# Patient Record
Sex: Female | Born: 1935 | Race: Black or African American | Hispanic: No | Smoking: Former smoker
Health system: Southern US, Community
[De-identification: ages and names within clinical notes are randomized; demographics above are authoritative.]

## PROBLEM LIST (undated history)

## (undated) DIAGNOSIS — Z87440 Personal history of urinary (tract) infections: Secondary | ICD-10-CM

## (undated) DIAGNOSIS — T7840XA Allergy, unspecified, initial encounter: Secondary | ICD-10-CM

## (undated) DIAGNOSIS — F329 Major depressive disorder, single episode, unspecified: Secondary | ICD-10-CM

## (undated) DIAGNOSIS — K219 Gastro-esophageal reflux disease without esophagitis: Secondary | ICD-10-CM

## (undated) DIAGNOSIS — I509 Heart failure, unspecified: Secondary | ICD-10-CM

## (undated) DIAGNOSIS — E7800 Pure hypercholesterolemia, unspecified: Secondary | ICD-10-CM

## (undated) DIAGNOSIS — I10 Essential (primary) hypertension: Secondary | ICD-10-CM

## (undated) DIAGNOSIS — F32A Depression, unspecified: Secondary | ICD-10-CM

## (undated) DIAGNOSIS — I639 Cerebral infarction, unspecified: Secondary | ICD-10-CM

## (undated) DIAGNOSIS — Z87442 Personal history of urinary calculi: Secondary | ICD-10-CM

## (undated) DIAGNOSIS — D649 Anemia, unspecified: Secondary | ICD-10-CM

## (undated) DIAGNOSIS — I499 Cardiac arrhythmia, unspecified: Secondary | ICD-10-CM

## (undated) DIAGNOSIS — Z9289 Personal history of other medical treatment: Secondary | ICD-10-CM

## (undated) HISTORY — DX: Anemia, unspecified: D64.9

## (undated) HISTORY — DX: Allergy, unspecified, initial encounter: T78.40XA

## (undated) HISTORY — DX: Cardiac arrhythmia, unspecified: I49.9

## (undated) HISTORY — DX: Personal history of other medical treatment: Z92.89

## (undated) HISTORY — DX: Personal history of urinary calculi: Z87.442

## (undated) HISTORY — DX: Essential (primary) hypertension: I10

## (undated) HISTORY — DX: Pure hypercholesterolemia, unspecified: E78.00

## (undated) HISTORY — DX: Gastro-esophageal reflux disease without esophagitis: K21.9

## (undated) HISTORY — DX: Cerebral infarction, unspecified: I63.9

## (undated) HISTORY — DX: Heart failure, unspecified: I50.9

## (undated) HISTORY — DX: Personal history of urinary (tract) infections: Z87.440

---

## 1982-05-03 HISTORY — PX: CHOLECYSTECTOMY: SHX55

## 2004-03-26 ENCOUNTER — Other Ambulatory Visit: Payer: Self-pay

## 2004-03-26 ENCOUNTER — Emergency Department: Payer: Self-pay | Admitting: Emergency Medicine

## 2004-03-30 ENCOUNTER — Other Ambulatory Visit: Payer: Self-pay

## 2004-03-30 ENCOUNTER — Emergency Department: Payer: Self-pay | Admitting: Emergency Medicine

## 2004-04-04 ENCOUNTER — Emergency Department: Payer: Self-pay | Admitting: Emergency Medicine

## 2004-05-06 ENCOUNTER — Ambulatory Visit: Payer: Self-pay | Admitting: Internal Medicine

## 2004-07-04 ENCOUNTER — Emergency Department: Payer: Self-pay | Admitting: General Practice

## 2004-07-20 ENCOUNTER — Ambulatory Visit: Payer: Self-pay | Admitting: Internal Medicine

## 2004-08-03 ENCOUNTER — Ambulatory Visit: Payer: Self-pay | Admitting: Internal Medicine

## 2004-10-02 ENCOUNTER — Ambulatory Visit: Payer: Self-pay | Admitting: Internal Medicine

## 2005-05-14 ENCOUNTER — Ambulatory Visit: Payer: Self-pay | Admitting: Gastroenterology

## 2005-07-22 ENCOUNTER — Other Ambulatory Visit: Payer: Self-pay

## 2005-07-22 ENCOUNTER — Emergency Department: Payer: Self-pay | Admitting: Internal Medicine

## 2005-07-29 ENCOUNTER — Ambulatory Visit: Payer: Self-pay | Admitting: Internal Medicine

## 2005-08-05 ENCOUNTER — Ambulatory Visit: Payer: Self-pay | Admitting: Internal Medicine

## 2005-10-17 ENCOUNTER — Emergency Department: Payer: Self-pay | Admitting: Emergency Medicine

## 2005-10-17 ENCOUNTER — Other Ambulatory Visit: Payer: Self-pay

## 2006-08-02 ENCOUNTER — Ambulatory Visit: Payer: Self-pay | Admitting: Internal Medicine

## 2006-10-21 ENCOUNTER — Ambulatory Visit: Payer: Self-pay | Admitting: Internal Medicine

## 2007-04-03 ENCOUNTER — Emergency Department: Payer: Self-pay | Admitting: Emergency Medicine

## 2007-04-03 ENCOUNTER — Other Ambulatory Visit: Payer: Self-pay

## 2007-11-01 ENCOUNTER — Ambulatory Visit: Payer: Self-pay | Admitting: Internal Medicine

## 2009-08-13 ENCOUNTER — Emergency Department: Payer: Self-pay | Admitting: Emergency Medicine

## 2011-06-14 LAB — BASIC METABOLIC PANEL
Creatinine: 0.8 mg/dL (ref 0.5–1.1)
Glucose: 95 mg/dL

## 2011-06-14 LAB — LIPID PANEL: Cholesterol: 192 mg/dL (ref 0–200)

## 2011-09-20 ENCOUNTER — Observation Stay: Payer: Self-pay | Admitting: Internal Medicine

## 2011-09-20 LAB — COMPREHENSIVE METABOLIC PANEL
Albumin: 3.7 g/dL (ref 3.4–5.0)
Anion Gap: 7 (ref 7–16)
Calcium, Total: 9.2 mg/dL (ref 8.5–10.1)
Chloride: 105 mmol/L (ref 98–107)
Co2: 30 mmol/L (ref 21–32)
Creatinine: 0.82 mg/dL (ref 0.60–1.30)
EGFR (African American): >60
EGFR (Non-African Amer.): >60
Glucose: 114 mg/dL — ABNORMAL HIGH (ref 65–99)
Osmolality: 285 (ref 275–301)
SGOT(AST): 20 U/L (ref 15–37)
Sodium: 142 mmol/L (ref 136–145)
Total Protein: 7.8 g/dL (ref 6.4–8.2)

## 2011-09-20 LAB — CBC
HGB: 13.8 g/dL (ref 12.0–16.0)
MCH: 28.1 pg (ref 26.0–34.0)
MCHC: 32.7 g/dL (ref 32.0–36.0)
Platelet: 356 10*3/uL (ref 150–440)
RDW: 14.1 % (ref 11.5–14.5)

## 2011-09-20 LAB — CK TOTAL AND CKMB (NOT AT ARMC): CK, Total: 94 U/L (ref 21–215)

## 2011-09-20 LAB — TROPONIN I: Troponin-I: 0.06 ng/mL — ABNORMAL HIGH

## 2011-09-21 LAB — BASIC METABOLIC PANEL WITH GFR
Anion Gap: 7
BUN: 11 mg/dL
Calcium, Total: 8.8 mg/dL
Chloride: 106 mmol/L
Co2: 29 mmol/L
Creatinine: 0.76 mg/dL
EGFR (African American): >60
EGFR (Non-African Amer.): >60
Glucose: 81 mg/dL
Osmolality: 282
Potassium: 3.9 mmol/L
Sodium: 142 mmol/L

## 2011-09-21 LAB — URINALYSIS, COMPLETE
Bacteria: NONE SEEN
Blood: NEGATIVE
Glucose,UR: NEGATIVE mg/dL (ref 0–75)
Nitrite: NEGATIVE
Protein: NEGATIVE
RBC,UR: <1 /HPF (ref 0–5)
Specific Gravity: 1.005 (ref 1.003–1.030)
Squamous Epithelial: 1
WBC UR: 7 /HPF (ref 0–5)

## 2011-09-21 LAB — LIPID PANEL
Cholesterol: 155 mg/dL (ref 0–200)
HDL Cholesterol: 46 mg/dL (ref 40–60)
Ldl Cholesterol, Calc: 85 mg/dL (ref 0–100)
Triglycerides: 119 mg/dL (ref 0–200)
VLDL Cholesterol, Calc: 24 mg/dL (ref 5–40)

## 2011-09-21 LAB — TROPONIN I: Troponin-I: 0.07 ng/mL — ABNORMAL HIGH

## 2012-03-22 ENCOUNTER — Encounter: Payer: Self-pay | Admitting: Internal Medicine

## 2012-03-22 ENCOUNTER — Ambulatory Visit: Payer: Self-pay | Admitting: Internal Medicine

## 2012-03-24 ENCOUNTER — Telehealth: Payer: Self-pay | Admitting: Internal Medicine

## 2012-03-24 NOTE — Telephone Encounter (Signed)
Amlodipine besylate 10mg  tab Take one tablet by mouth every day [employer]

## 2012-03-29 ENCOUNTER — Other Ambulatory Visit: Payer: Self-pay | Admitting: *Deleted

## 2012-03-29 MED ORDER — AMLODIPINE BESYLATE 10 MG PO TABS: 10.0000 mg | ORAL_TABLET | Freq: Every day | ORAL | Status: DC

## 2012-03-29 NOTE — Telephone Encounter (Signed)
Refilled script for NORVASC 10 mg

## 2012-04-13 ENCOUNTER — Telehealth: Payer: Self-pay | Admitting: Internal Medicine

## 2012-04-13 NOTE — Telephone Encounter (Signed)
She still has not gotten confirmation about medicare. However, she has spoken to someone there. I asked her to call back tomorrow and speak to someone up front about her insurance.

## 2012-04-13 NOTE — Telephone Encounter (Signed)
Pt is needing a call back. She would not go into detail she wanted to speak with a nurse.

## 2012-04-13 NOTE — Telephone Encounter (Signed)
Please call pt and find out what she needs

## 2012-07-11 ENCOUNTER — Telehealth: Payer: Self-pay | Admitting: Internal Medicine

## 2012-07-11 NOTE — Telephone Encounter (Signed)
Previous pt of Dr. Bary Leriche.  Not scheduled until 10/12/12 as new pt here; pt was a no show  03/2012.Marland Kitchen  Pt states she needs her meds and Dr. Nicki Reaper has been her only doctor.  Pt asking for help to get refills until she can be seen.   Please advise.

## 2012-07-12 ENCOUNTER — Other Ambulatory Visit: Payer: Self-pay | Admitting: *Deleted

## 2012-07-12 MED ORDER — AMLODIPINE BESYLATE 10 MG PO TABS: 10.0000 mg | ORAL_TABLET | Freq: Every day | ORAL | Status: DC

## 2012-07-12 MED ORDER — OMEPRAZOLE 20 MG PO CPDR: 20.0000 mg | DELAYED_RELEASE_CAPSULE | Freq: Every day | ORAL | Status: DC

## 2012-07-12 NOTE — Telephone Encounter (Signed)
Patient needing her blood pressure medication and acid reflux medication. Patient has been out of her medication for a couple of days

## 2012-07-12 NOTE — Telephone Encounter (Signed)
Sent in to pharmacy. Per Dr Nicki Reaper

## 2012-07-13 NOTE — Telephone Encounter (Signed)
Meds have already been filled

## 2012-10-10 ENCOUNTER — Telehealth: Payer: Self-pay | Admitting: Internal Medicine

## 2012-10-10 ENCOUNTER — Other Ambulatory Visit: Payer: Self-pay | Admitting: *Deleted

## 2012-10-10 MED ORDER — ESCITALOPRAM OXALATE 10 MG PO TABS: 5.0000 mg | ORAL_TABLET | Freq: Every day | ORAL | Status: DC

## 2012-10-10 MED ORDER — AMLODIPINE BESYLATE 10 MG PO TABS: 10.0000 mg | ORAL_TABLET | Freq: Every day | ORAL | Status: DC

## 2012-10-10 NOTE — Telephone Encounter (Signed)
Refilled for 30 days-pt aware that we will send in additional refills after her visit on Thursday

## 2012-10-10 NOTE — Telephone Encounter (Addendum)
escitalopram (LEXAPRO) 10 MG tablet #90  amLODipine (NORVASC) 10 MG tablet #90

## 2012-10-12 ENCOUNTER — Ambulatory Visit (INDEPENDENT_AMBULATORY_CARE_PROVIDER_SITE_OTHER): Payer: Medicare Other | Admitting: Internal Medicine

## 2012-10-12 ENCOUNTER — Encounter: Payer: Self-pay | Admitting: Internal Medicine

## 2012-10-12 VITALS — BP 120/70 | HR 87 | Temp 98.3°F | Ht 63.5 in | Wt 148.5 lb

## 2012-10-12 DIAGNOSIS — F419 Anxiety disorder, unspecified: Secondary | ICD-10-CM

## 2012-10-12 DIAGNOSIS — F411 Generalized anxiety disorder: Secondary | ICD-10-CM

## 2012-10-12 DIAGNOSIS — I10 Essential (primary) hypertension: Secondary | ICD-10-CM

## 2012-10-12 DIAGNOSIS — E7800 Pure hypercholesterolemia, unspecified: Secondary | ICD-10-CM

## 2012-10-12 DIAGNOSIS — I639 Cerebral infarction, unspecified: Secondary | ICD-10-CM

## 2012-10-12 DIAGNOSIS — R5381 Other malaise: Secondary | ICD-10-CM

## 2012-10-12 DIAGNOSIS — R5383 Other fatigue: Secondary | ICD-10-CM

## 2012-10-12 DIAGNOSIS — I6350 Cerebral infarction due to unspecified occlusion or stenosis of unspecified cerebral artery: Secondary | ICD-10-CM

## 2012-10-12 DIAGNOSIS — E119 Type 2 diabetes mellitus without complications: Secondary | ICD-10-CM

## 2012-10-12 DIAGNOSIS — R634 Abnormal weight loss: Secondary | ICD-10-CM

## 2012-10-18 ENCOUNTER — Other Ambulatory Visit (INDEPENDENT_AMBULATORY_CARE_PROVIDER_SITE_OTHER): Payer: Medicare Other

## 2012-10-18 DIAGNOSIS — R5381 Other malaise: Secondary | ICD-10-CM

## 2012-10-18 DIAGNOSIS — E7800 Pure hypercholesterolemia, unspecified: Secondary | ICD-10-CM

## 2012-10-18 DIAGNOSIS — R5383 Other fatigue: Secondary | ICD-10-CM

## 2012-10-18 DIAGNOSIS — E119 Type 2 diabetes mellitus without complications: Secondary | ICD-10-CM

## 2012-10-18 LAB — CBC WITH DIFFERENTIAL/PLATELET
Basophils Relative: 0.5 % (ref 0.0–3.0)
Eosinophils Absolute: 0.3 10*3/uL (ref 0.0–0.7)
Eosinophils Relative: 5.7 % — ABNORMAL HIGH (ref 0.0–5.0)
Lymphocytes Relative: 34 % (ref 12.0–46.0)
Monocytes Relative: 8.8 % (ref 3.0–12.0)
Neutrophils Relative %: 51 % (ref 43.0–77.0)
RBC: 5.07 Mil/uL (ref 3.87–5.11)
WBC: 5.9 10*3/uL (ref 4.5–10.5)

## 2012-10-18 LAB — BASIC METABOLIC PANEL
Calcium: 9.3 mg/dL (ref 8.4–10.5)
Creatinine, Ser: 0.8 mg/dL (ref 0.4–1.2)
GFR: 73.45 mL/min (ref >60.00)
Glucose, Bld: 107 mg/dL — ABNORMAL HIGH (ref 70–99)
Sodium: 139 mEq/L (ref 135–145)

## 2012-10-18 LAB — LIPID PANEL
HDL: 56.4 mg/dL (ref >39.00)
Triglycerides: 127 mg/dL (ref 0.0–149.0)

## 2012-10-18 LAB — HEPATIC FUNCTION PANEL
ALT: 9 U/L (ref 0–35)
Albumin: 4 g/dL (ref 3.5–5.2)
Total Protein: 7.4 g/dL (ref 6.0–8.3)

## 2012-10-18 LAB — LDL CHOLESTEROL, DIRECT: Direct LDL: 126.7 mg/dL

## 2012-10-19 ENCOUNTER — Other Ambulatory Visit: Payer: Self-pay | Admitting: *Deleted

## 2012-10-19 MED ORDER — OMEPRAZOLE 20 MG PO CPDR: 20.0000 mg | DELAYED_RELEASE_CAPSULE | Freq: Every day | ORAL | Status: DC

## 2012-10-22 ENCOUNTER — Encounter: Payer: Self-pay | Admitting: Internal Medicine

## 2012-10-22 DIAGNOSIS — Z8673 Personal history of transient ischemic attack (TIA), and cerebral infarction without residual deficits: Secondary | ICD-10-CM | POA: Insufficient documentation

## 2012-10-22 DIAGNOSIS — E7800 Pure hypercholesterolemia, unspecified: Secondary | ICD-10-CM | POA: Insufficient documentation

## 2012-10-22 DIAGNOSIS — F419 Anxiety disorder, unspecified: Secondary | ICD-10-CM | POA: Insufficient documentation

## 2012-10-22 DIAGNOSIS — R634 Abnormal weight loss: Secondary | ICD-10-CM | POA: Insufficient documentation

## 2012-10-22 DIAGNOSIS — I10 Essential (primary) hypertension: Secondary | ICD-10-CM | POA: Insufficient documentation

## 2012-10-22 MED ORDER — ESCITALOPRAM OXALATE 10 MG PO TABS: 5.0000 mg | ORAL_TABLET | Freq: Every day | ORAL | Status: DC

## 2012-10-22 NOTE — Assessment & Plan Note (Signed)
Low cholesterol diet and exercise.  Continue lipitor.  Check lipid panel and liver function.

## 2012-10-22 NOTE — Assessment & Plan Note (Signed)
Blood pressure doing well.  Follow.  Follow metabolic panel.

## 2012-10-22 NOTE — Assessment & Plan Note (Signed)
Declines further w/up.  Will check labs.  Is agreeable to have labs drawn.  Follow.  Encourage increased po intake.

## 2012-10-22 NOTE — Progress Notes (Signed)
Subjective:    Patient ID: Taylor Watson, female    DOB: [birth year], 79 y.o.   MRN: [id number]  HPI 79 year old female with past history of hypertension, hypercholesterolemia and previous CVA who comes in today for a scheduled follow up.  She states she is doing relatively well.  She has had some issues with her left eye running.  Is due to see opthalmology next month.  No pain in the eye.  No vision change.  Breathing stable.  No chest pain or tightness.  Due to see Dr Ubaldo Glassing next month.  No acid reflux.  Bowels stable.  Weight is down some.     Past Medical History  Diagnosis Date  . Hypertension   . CVA (cerebral vascular accident)   . Anemia   . Hypercholesterolemia   . GERD (gastroesophageal reflux disease)   . Allergy   . Hx: UTI (urinary tract infection)   . History of blood transfusion   . History of kidney stones     Outpatient Encounter Prescriptions as of 10/12/2012  Medication Sig Dispense Refill  . amLODipine (NORVASC) 10 MG tablet Take 1 tablet (10 mg total) by mouth daily.  30 tablet  0  . aspirin 81 MG tablet Take 81 mg by mouth daily.      Marland Kitchen atorvastatin (LIPITOR) 40 MG tablet Take 40 mg by mouth daily.      Marland Kitchen escitalopram (LEXAPRO) 10 MG tablet Take 0.5 tablets (5 mg total) by mouth daily. 1/2 tablet q day  30 tablet  0  . latanoprost (XALATAN) 0.005 % ophthalmic solution Place 1 drop into the left eye at bedtime.      . [DISCONTINUED] omeprazole (PRILOSEC) 20 MG capsule Take 1 capsule (20 mg total) by mouth daily.  30 capsule  2  . [DISCONTINUED] Calcium Carb-Cholecalciferol (CALCIUM 500 +D) 500-400 MG-UNIT TABS Take 1 tablet by mouth daily.       No facility-administered encounter medications on file as of 10/12/2012.    Review of Systems Patient denies any headache, lightheadedness or dizziness.  No significant sinus or allergy symptoms.  No chest pain, tightness or palpitations.  No increased shortness of breath, cough or congestion.  No nausea or vomiting.  No  acid reflux.  No abdominal pain or cramping.  No bowel change, such as diarrhea, constipation, BRBPR or melana.  No urine change.  Due to see Dr Ubaldo Glassing next month.  Some weight loss.  Handling stress well.       Objective:   Physical Exam Filed Vitals:   10/12/12 1529  BP: 120/70  Pulse: 87  Temp: 98.3 F (8.56 C)   79 year old female in no acute distress.   HEENT:  Nares- clear.  Oropharynx - without lesions. NECK:  Supple.  Nontender.  No audible bruit.  HEART:  Appears to be regular. LUNGS:  No crackles or wheezing audible.  Respirations even and unlabored.  RADIAL PULSE:  Equal bilaterally.  ABDOMEN:  Soft, nontender.  Bowel sounds present and normal.  No audible abdominal bruit.   EXTREMITIES:  No increased edema present.  DP pulses palpable and equal bilaterally.           Assessment & Plan:  GI.  She declines colonoscopy or further GI w/up.  Follow.    PULMONARY.  No cough or congestion.  Feels breathing stable.  Follow.   CARDIOVASCULAR.  Feels stable.  Due to see Dr Ubaldo Glassing next month.  Follow.  FATIGUE AND WEIGHT LOSS.  Check cbc, met c and tsh.     HEALTH MAINTENANCE.  Declines physical.  Declines GU, breast and rectal exams.  Declines further GI evaluation, mammogram and scanning.

## 2012-10-22 NOTE — Assessment & Plan Note (Signed)
On aspirin.  No reoccurring symptoms.  Follow.   

## 2012-10-22 NOTE — Assessment & Plan Note (Signed)
Does well on Lexapro qod.  Follow.

## 2012-11-01 ENCOUNTER — Other Ambulatory Visit: Payer: Self-pay | Admitting: Internal Medicine

## 2012-11-01 NOTE — Progress Notes (Signed)
Order placed for f/u liver panel.  

## 2012-11-07 ENCOUNTER — Other Ambulatory Visit: Payer: Self-pay | Admitting: *Deleted

## 2012-11-07 MED ORDER — AMLODIPINE BESYLATE 10 MG PO TABS: 10.0000 mg | ORAL_TABLET | Freq: Every day | ORAL | Status: DC

## 2012-11-09 ENCOUNTER — Other Ambulatory Visit: Payer: Self-pay

## 2012-11-15 ENCOUNTER — Other Ambulatory Visit (INDEPENDENT_AMBULATORY_CARE_PROVIDER_SITE_OTHER): Payer: Medicare Other

## 2012-11-15 DIAGNOSIS — R17 Unspecified jaundice: Secondary | ICD-10-CM

## 2012-11-15 LAB — HEPATIC FUNCTION PANEL
AST: 10 U/L (ref 0–37)
Albumin: 3.8 g/dL (ref 3.5–5.2)

## 2012-11-16 ENCOUNTER — Other Ambulatory Visit: Payer: Self-pay | Admitting: Internal Medicine

## 2012-11-16 DIAGNOSIS — I10 Essential (primary) hypertension: Secondary | ICD-10-CM

## 2012-11-16 DIAGNOSIS — E7800 Pure hypercholesterolemia, unspecified: Secondary | ICD-10-CM

## 2012-11-16 NOTE — Progress Notes (Signed)
Order placed for f/u labs.  

## 2013-01-12 ENCOUNTER — Other Ambulatory Visit (INDEPENDENT_AMBULATORY_CARE_PROVIDER_SITE_OTHER): Payer: Medicare Other

## 2013-01-12 DIAGNOSIS — I10 Essential (primary) hypertension: Secondary | ICD-10-CM

## 2013-01-12 DIAGNOSIS — E7800 Pure hypercholesterolemia, unspecified: Secondary | ICD-10-CM

## 2013-01-12 LAB — HEPATIC FUNCTION PANEL
ALT: 7 U/L (ref 0–35)
AST: 14 U/L (ref 0–37)
Albumin: 3.7 g/dL (ref 3.5–5.2)
Alkaline Phosphatase: 120 U/L — ABNORMAL HIGH (ref 39–117)
Bilirubin, Direct: 0 mg/dL (ref 0.0–0.3)
Total Bilirubin: 0.9 mg/dL (ref 0.3–1.2)
Total Protein: 7.4 g/dL (ref 6.0–8.3)

## 2013-01-12 LAB — BASIC METABOLIC PANEL
BUN: 13 mg/dL (ref 6–23)
CO2: 31 mEq/L (ref 19–32)
Calcium: 9.4 mg/dL (ref 8.4–10.5)
Chloride: 104 mEq/L (ref 96–112)
Creatinine, Ser: 0.9 mg/dL (ref 0.4–1.2)
GFR: 66.48 mL/min (ref >60.00)
Glucose, Bld: 91 mg/dL (ref 70–99)
Potassium: 3.9 mEq/L (ref 3.5–5.1)
Sodium: 140 mEq/L (ref 135–145)

## 2013-01-12 LAB — LIPID PANEL
Cholesterol: 234 mg/dL — ABNORMAL HIGH (ref 0–200)
VLDL: 27.6 mg/dL (ref 0.0–40.0)

## 2013-01-12 LAB — LDL CHOLESTEROL, DIRECT: Direct LDL: 163.5 mg/dL

## 2013-01-16 ENCOUNTER — Encounter: Payer: Self-pay | Admitting: Internal Medicine

## 2013-01-16 ENCOUNTER — Ambulatory Visit (INDEPENDENT_AMBULATORY_CARE_PROVIDER_SITE_OTHER): Payer: Medicare Other | Admitting: Internal Medicine

## 2013-01-16 VITALS — BP 150/60 | HR 78 | Temp 98.5°F | Resp 12 | Ht 63.5 in | Wt 148.5 lb

## 2013-01-16 DIAGNOSIS — I6350 Cerebral infarction due to unspecified occlusion or stenosis of unspecified cerebral artery: Secondary | ICD-10-CM

## 2013-01-16 DIAGNOSIS — F419 Anxiety disorder, unspecified: Secondary | ICD-10-CM

## 2013-01-16 DIAGNOSIS — R634 Abnormal weight loss: Secondary | ICD-10-CM

## 2013-01-16 DIAGNOSIS — I639 Cerebral infarction, unspecified: Secondary | ICD-10-CM

## 2013-01-16 DIAGNOSIS — R42 Dizziness and giddiness: Secondary | ICD-10-CM

## 2013-01-16 DIAGNOSIS — E7800 Pure hypercholesterolemia, unspecified: Secondary | ICD-10-CM

## 2013-01-16 DIAGNOSIS — I10 Essential (primary) hypertension: Secondary | ICD-10-CM

## 2013-01-16 DIAGNOSIS — F411 Generalized anxiety disorder: Secondary | ICD-10-CM

## 2013-01-16 MED ORDER — ESCITALOPRAM OXALATE 10 MG PO TABS: 5.0000 mg | ORAL_TABLET | Freq: Every day | ORAL | Status: DC

## 2013-01-16 MED ORDER — AMLODIPINE BESYLATE 5 MG PO TABS: 5.0000 mg | ORAL_TABLET | Freq: Every day | ORAL | Status: DC

## 2013-01-19 ENCOUNTER — Encounter: Payer: Self-pay | Admitting: Internal Medicine

## 2013-01-19 DIAGNOSIS — R42 Dizziness and giddiness: Secondary | ICD-10-CM | POA: Insufficient documentation

## 2013-01-19 NOTE — Progress Notes (Signed)
Subjective:    Patient ID: Taylor Watson, female    DOB: 22-May-1918, 79 y.o.   MRN: [id number]  HPI 79 year old female with past history of hypertension, hypercholesterolemia and previous CVA who comes in today for a scheduled follow up.  She states she is doing relatively well.  Breathing stable.  No chest pain or tightness.   No acid reflux.  Bowels stable.  Weight stable from last check.  Has noticed some minimal light headedness with standing.  No persistent dizziness.  No headache.  States she is eating and drinking well.  Taking her medications.  Overall she feels she is doing relatively well.    Past Medical History  Diagnosis Date  . Hypertension   . CVA (cerebral vascular accident)   . Anemia   . Hypercholesterolemia   . GERD (gastroesophageal reflux disease)   . Allergy   . Hx: UTI (urinary tract infection)   . History of blood transfusion   . History of kidney stones     Outpatient Encounter Prescriptions as of 01/16/2013  Medication Sig Dispense Refill  . aspirin 81 MG tablet Take 81 mg by mouth daily.      Marland Kitchen escitalopram (LEXAPRO) 10 MG tablet Take 0.5 tablets (5 mg total) by mouth daily. 1/2 tablet q day  30 tablet  3  . latanoprost (XALATAN) 0.005 % ophthalmic solution Place 1 drop into the left eye at bedtime.      Marland Kitchen omeprazole (PRILOSEC) 20 MG capsule Take 1 capsule (20 mg total) by mouth daily.  30 capsule  5  . [DISCONTINUED] amLODipine (NORVASC) 10 MG tablet Take 1 tablet (10 mg total) by mouth daily.  30 tablet  5  . [DISCONTINUED] escitalopram (LEXAPRO) 10 MG tablet Take 0.5 tablets (5 mg total) by mouth daily. 1/2 tablet q day  30 tablet  3  . amLODipine (NORVASC) 5 MG tablet Take 1 tablet (5 mg total) by mouth daily. Do not take with 10mg  amlodipine.  Stopping 10mg  amlodipine.  30 tablet  3  . [DISCONTINUED] atorvastatin (LIPITOR) 40 MG tablet Take 40 mg by mouth daily.       No facility-administered encounter medications on file as of 01/16/2013.    Review  of Systems Patient denies any headache, lightheadedness or dizziness.  No significant sinus or allergy symptoms.  No chest pain, tightness or palpitations.  No increased shortness of breath, cough or congestion.  No nausea or vomiting.  No acid reflux.  No abdominal pain or cramping.  No bowel change, such as diarrhea, constipation, BRBPR or melana.  No urine change.  Some weight loss.  Handling stress well.       Objective:   Physical Exam  Filed Vitals:   01/16/13 1131  BP: 150/60  Pulse: 78  Temp: 98.5 F (36.9 C)  Resp: 12   Blood pressure recheck:  140/68, pulse 44  79 year old female in no acute distress.   HEENT:  Nares- clear.  Oropharynx - without lesions. NECK:  Supple.  Nontender.  No audible bruit.  HEART:  Appears to be regular. LUNGS:  No crackles or wheezing audible.  Respirations even and unlabored.  RADIAL PULSE:  Equal bilaterally.  ABDOMEN:  Soft, nontender.  Bowel sounds present and normal.  No audible abdominal bruit.   EXTREMITIES:  No increased edema present.  DP pulses palpable and equal bilaterally.           Assessment & Plan:  GI.  She declines colonoscopy or  further GI w/up.  Follow.    PULMONARY.  No cough or congestion.  Feels breathing stable.  Follow.   CARDIOVASCULAR.  Feels stable.  Sees Dr Ubaldo Glassing.  Follow.  HEALTH MAINTENANCE.  Declines physical.  Declines GU, breast and rectal exams.  Declines further GI evaluation, mammogram and scanning.

## 2013-01-19 NOTE — Assessment & Plan Note (Signed)
Blood pressure as outlined.  Will decrease the amlodipine to 5 mg q day (to confirm that this is not the etiology for her light headedness).  Follow pressures closely.  Get her back in soon to reassess.

## 2013-01-19 NOTE — Assessment & Plan Note (Signed)
Low cholesterol diet and exercise.  Continue lipitor.  Follow lipid panel and liver function.

## 2013-01-19 NOTE — Assessment & Plan Note (Signed)
On aspirin.  No reoccurring symptoms.  Follow.

## 2013-01-19 NOTE — Assessment & Plan Note (Signed)
Declines further w/up.  Weight stable from last check.  Follow.

## 2013-01-19 NOTE — Assessment & Plan Note (Signed)
Blood pressure as outlined.  Adjust amlodipine as outlined.  Follow.  Follow metabolic panel.

## 2013-01-19 NOTE — Assessment & Plan Note (Addendum)
Does well on Lexapro.   Follow.

## 2013-02-01 ENCOUNTER — Encounter: Payer: Self-pay | Admitting: Internal Medicine

## 2013-02-22 ENCOUNTER — Ambulatory Visit: Payer: Medicare Other | Admitting: Internal Medicine

## 2013-03-15 ENCOUNTER — Encounter (INDEPENDENT_AMBULATORY_CARE_PROVIDER_SITE_OTHER): Payer: Self-pay

## 2013-03-15 ENCOUNTER — Ambulatory Visit (INDEPENDENT_AMBULATORY_CARE_PROVIDER_SITE_OTHER): Payer: Medicare Other | Admitting: Internal Medicine

## 2013-03-15 ENCOUNTER — Encounter: Payer: Self-pay | Admitting: Internal Medicine

## 2013-03-15 VITALS — BP 150/68 | HR 77 | Temp 98.0°F | Resp 12 | Ht 63.5 in | Wt 151.5 lb

## 2013-03-15 DIAGNOSIS — I6350 Cerebral infarction due to unspecified occlusion or stenosis of unspecified cerebral artery: Secondary | ICD-10-CM

## 2013-03-15 DIAGNOSIS — F419 Anxiety disorder, unspecified: Secondary | ICD-10-CM

## 2013-03-15 DIAGNOSIS — E7800 Pure hypercholesterolemia, unspecified: Secondary | ICD-10-CM

## 2013-03-15 DIAGNOSIS — I639 Cerebral infarction, unspecified: Secondary | ICD-10-CM

## 2013-03-15 DIAGNOSIS — R634 Abnormal weight loss: Secondary | ICD-10-CM

## 2013-03-15 DIAGNOSIS — R42 Dizziness and giddiness: Secondary | ICD-10-CM

## 2013-03-15 DIAGNOSIS — I10 Essential (primary) hypertension: Secondary | ICD-10-CM

## 2013-03-15 DIAGNOSIS — F411 Generalized anxiety disorder: Secondary | ICD-10-CM

## 2013-03-15 NOTE — Progress Notes (Signed)
Pre visit review using our clinic review tool, if applicable. No additional management support is needed unless otherwise documented below in the visit note. 

## 2013-03-18 ENCOUNTER — Encounter: Payer: Self-pay | Admitting: Internal Medicine

## 2013-03-18 NOTE — Assessment & Plan Note (Signed)
On aspirin.  No reoccurring symptoms.  Follow.

## 2013-03-18 NOTE — Assessment & Plan Note (Signed)
Low cholesterol diet and exercise.  Continue lipitor.  Follow lipid panel and liver function.

## 2013-03-18 NOTE — Progress Notes (Signed)
  Subjective:    Patient ID: Taylor Watson, female    DOB: May 24, 1918, 79 y.o.   MRN: [id number]  HPI 79 year old female with past history of hypertension, hypercholesterolemia and previous CVA who comes in today for a scheduled follow up.  She states she is doing relatively well.  Breathing stable.  No chest pain or tightness.   No acid reflux.  Bowels stable.  Weight stable from last check.  Had noticed some minimal light headedness with standing.  No persistent dizziness.  No headache.  We decreased her norvasc to 5mg  q day last visit.  States she feels she is doing ok.  Feels good.  States she is eating and drinking well.  Taking her medications.     Past Medical History  Diagnosis Date  . Hypertension   . CVA (cerebral vascular accident)   . Anemia   . Hypercholesterolemia   . GERD (gastroesophageal reflux disease)   . Allergy   . Hx: UTI (urinary tract infection)   . History of blood transfusion   . History of kidney stones     Outpatient Encounter Prescriptions as of 03/15/2013  Medication Sig  . amLODipine (NORVASC) 5 MG tablet Take 1 tablet (5 mg total) by mouth daily. Do not take with 10mg  amlodipine.  Stopping 10mg  amlodipine.  Marland Kitchen aspirin 81 MG tablet Take 81 mg by mouth daily.  Marland Kitchen escitalopram (LEXAPRO) 10 MG tablet Take 0.5 tablets (5 mg total) by mouth daily. 1/2 tablet q day  . latanoprost (XALATAN) 0.005 % ophthalmic solution Place 1 drop into the left eye at bedtime.  Marland Kitchen omeprazole (PRILOSEC) 20 MG capsule Take 1 capsule (20 mg total) by mouth daily.    Review of Systems Patient denies any headache, lightheadedness or dizziness.  No significant sinus or allergy symptoms.  No chest pain, tightness or palpitations.  No increased shortness of breath, cough or congestion.  No nausea or vomiting.  No acid reflux.  No abdominal pain or cramping.  No bowel change, such as diarrhea, constipation, BRBPR or melana.  No urine change. Weight up a few pounds.   Handling stress well.        Objective:   Physical Exam  Filed Vitals:   03/15/13 1126  BP: 150/68  Pulse: 77  Temp: 98 F (36.7 C)  Resp: 12   Blood pressure recheck:  148-150/68, pulse 44  79 year old female in no acute distress.   HEENT:  Nares- clear.  Oropharynx - without lesions. NECK:  Supple.  Nontender.  No audible bruit.  HEART:  Appears to be regular. LUNGS:  No crackles or wheezing audible.  Respirations even and unlabored.  RADIAL PULSE:  Equal bilaterally.  ABDOMEN:  Soft, nontender.  Bowel sounds present and normal.  No audible abdominal bruit.   EXTREMITIES:  No increased edema present.  DP pulses palpable and equal bilaterally.           Assessment & Plan:  GI.  She declines colonoscopy or further GI w/up.  Follow.    PULMONARY.  No cough or congestion.  Feels breathing stable.  Follow.   CARDIOVASCULAR.  Feels stable.  Sees Dr Ubaldo Glassing.  Follow.  HEALTH MAINTENANCE.  Declines physical.  Declines GU, breast and rectal exams.  Declines further GI evaluation, mammogram and scanning.

## 2013-03-18 NOTE — Assessment & Plan Note (Signed)
Does well on Lexapro.   Follow.

## 2013-03-18 NOTE — Assessment & Plan Note (Signed)
Weight is up a few pounds from the last visit.  She feels she is doing well.  Follow.

## 2013-03-18 NOTE — Assessment & Plan Note (Signed)
Blood pressure as outlined.  Stable.  Continue current medication regimen.  Follow.  Follow metabolic panel.

## 2013-03-18 NOTE — Assessment & Plan Note (Signed)
Changed her norvasc to 5mg  last visit.  She is doing well.  Blood pressure stable.  Follow.

## 2013-04-03 ENCOUNTER — Encounter: Payer: Self-pay | Admitting: Internal Medicine

## 2013-04-03 ENCOUNTER — Ambulatory Visit (INDEPENDENT_AMBULATORY_CARE_PROVIDER_SITE_OTHER): Payer: Medicare Other | Admitting: Internal Medicine

## 2013-04-03 VITALS — BP 122/80 | HR 80 | Temp 98.5°F | Ht 63.5 in | Wt 150.5 lb

## 2013-04-03 DIAGNOSIS — K625 Hemorrhage of anus and rectum: Secondary | ICD-10-CM

## 2013-04-03 DIAGNOSIS — I639 Cerebral infarction, unspecified: Secondary | ICD-10-CM

## 2013-04-03 DIAGNOSIS — I6350 Cerebral infarction due to unspecified occlusion or stenosis of unspecified cerebral artery: Secondary | ICD-10-CM

## 2013-04-03 DIAGNOSIS — R634 Abnormal weight loss: Secondary | ICD-10-CM

## 2013-04-03 DIAGNOSIS — I10 Essential (primary) hypertension: Secondary | ICD-10-CM

## 2013-04-03 LAB — CBC WITH DIFFERENTIAL/PLATELET
Basophils Relative: 0.3 % (ref 0.0–3.0)
Eosinophils Absolute: 0.3 10*3/uL (ref 0.0–0.7)
Lymphocytes Relative: 24.7 % (ref 12.0–46.0)
Lymphs Abs: 1.5 10*3/uL (ref 0.7–4.0)
MCHC: 32.8 g/dL (ref 30.0–36.0)
MCV: 85.5 fl (ref 78.0–100.0)
Monocytes Absolute: 0.3 10*3/uL (ref 0.1–1.0)
Neutrophils Relative %: 64.8 % (ref 43.0–77.0)
Platelets: 259 10*3/uL (ref 150.0–400.0)
WBC: 6.1 10*3/uL (ref 4.5–10.5)

## 2013-04-03 NOTE — Progress Notes (Signed)
Pre-visit discussion using our clinic review tool. No additional management support is needed unless otherwise documented below in the visit note.  

## 2013-04-03 NOTE — Assessment & Plan Note (Signed)
Blood pressure stable.  Not orthostatic on exam.  Follow.

## 2013-04-03 NOTE — Assessment & Plan Note (Signed)
Rectal bleeding as outlined.  Exam as outlined.  Check cbc stat.  Discussed with GI.  They agreed to see pt today.  Discussed with the patient and her grandson.  Agreed to appt and further GI w/up.  Concern regarding bleeding coming from more than hemorrhoidal bleeding.  Pt comfortable with this plan.

## 2013-04-03 NOTE — Progress Notes (Signed)
Subjective:    Patient ID: Taylor Watson, female    DOB: 04/14/1919, 79 y.o.   MRN: [id number]  Rectal Bleeding   79 year old female with past history of hypertension, hypercholesterolemia and previous CVA who comes in today as a work in with concerns regarding rectal bleeding.  She states she was doing fine until six days ago.  She had a bowel movement on 03/28/13 and noticed blood.  Described it as looking like jelly.  Stool soft.  She had two episodes that day.  The following day had another bloody bowel movement - described the same.  The next couple of days - fine.  The following day - watery stool, but no blood.  This am had another bloody stool.  States she feels weak, but denies any acute worsening dizziness.  No abdominal pain or cramping.   Breathing stable.  No chest pain or tightness.   No acid reflux.  Weight stable from last check. In reviewing, she weighed 158 pounds 4/13. .  States she is eating and drinking ok.  Taking her medications.     Past Medical History  Diagnosis Date  . Hypertension   . CVA (cerebral vascular accident)   . Anemia   . Hypercholesterolemia   . GERD (gastroesophageal reflux disease)   . Allergy   . Hx: UTI (urinary tract infection)   . History of blood transfusion   . History of kidney stones     Outpatient Encounter Prescriptions as of 04/03/2013  Medication Sig  . amLODipine (NORVASC) 5 MG tablet Take 1 tablet (5 mg total) by mouth daily. Do not take with 10mg  amlodipine.  Stopping 10mg  amlodipine.  Marland Kitchen aspirin 81 MG tablet Take 81 mg by mouth daily.  Marland Kitchen escitalopram (LEXAPRO) 10 MG tablet Take 0.5 tablets (5 mg total) by mouth daily. 1/2 tablet q day  . latanoprost (XALATAN) 0.005 % ophthalmic solution Place 1 drop into the left eye at bedtime.  Marland Kitchen omeprazole (PRILOSEC) 20 MG capsule Take 1 capsule (20 mg total) by mouth daily.    Review of Systems  Gastrointestinal: Positive for hematochezia.  Patient denies any headache or significant  lightheadedness or dizziness.  No chest pain, tightness or palpitations.  No increased shortness of breath, cough or congestion.  No nausea or vomiting.  No acid reflux.  No abdominal pain or cramping.  Bowel change as outlined.  Bleeding as outlined.  Feels weaker.        Objective:   Physical Exam  Filed Vitals:   04/03/13 1057  BP: 122/80  Pulse: 80  Temp: 98.5 F (36.9 C)   Blood pressure recheck:  138/72 lying and 138-140/78 standing.  79 year old female in no acute distress.   Oropharynx - without lesions. NECK:  Supple.  Nontender.   HEART:  Appears to be regular. LUNGS:  No crackles or wheezing audible.  Respirations even and unlabored.  RADIAL PULSE:  Equal bilaterally.  ABDOMEN:  Soft, nontender.  Bowel sounds present and normal.  No audible abdominal bruit.  RECTAL:  Increased perirectal tissue.  Some hemorrhoidal tissue.  Increased fullness.  Gross blood with internal rectal exam.   EXTREMITIES:  No increased edema present.            Assessment & Plan:  PULMONARY.  No cough or congestion.  Feels breathing stable.  Follow.   CARDIOVASCULAR.  Feels stable.  Sees Dr Ubaldo Glassing.  Follow.  HEALTH MAINTENANCE.  Has declined physical.  Declines GU, breast and rectal  exams.  Has previously declined further GI evaluation, mammogram and scanning.  Agreeable to further GI evaluation now.

## 2013-04-03 NOTE — Assessment & Plan Note (Signed)
Weight as outlined.  Stable from last check.  Follow.

## 2013-04-03 NOTE — Assessment & Plan Note (Signed)
On aspirin.  No reoccurring symptoms.  Follow.

## 2013-04-03 NOTE — Patient Instructions (Signed)
Appt with Luna Kitchens - Gastroenterology [facility]) - 4:00 pm

## 2013-04-06 ENCOUNTER — Ambulatory Visit: Payer: Self-pay | Admitting: Gastroenterology

## 2013-04-16 ENCOUNTER — Telehealth: Payer: Self-pay | Admitting: Internal Medicine

## 2013-04-16 NOTE — Telephone Encounter (Signed)
Would like to ensure we have received CT scan which was abnormal, possible thrombus.  Would like Dr. Nicki Reaper to review and schedule f/u appt with pt when Dr. Nicki Reaper feels a f/u is appropriate.

## 2013-04-17 ENCOUNTER — Other Ambulatory Visit: Payer: Self-pay | Admitting: Internal Medicine

## 2013-04-17 NOTE — Telephone Encounter (Signed)
See message below °

## 2013-04-17 NOTE — Telephone Encounter (Signed)
Discussed with vascular surgery.  Felt no further w/up warranted.  Will discuss with GI

## 2013-05-01 ENCOUNTER — Other Ambulatory Visit: Payer: Self-pay | Admitting: Internal Medicine

## 2013-05-03 HISTORY — PX: NECK LESION BIOPSY: SHX2078

## 2013-05-11 ENCOUNTER — Other Ambulatory Visit: Payer: Self-pay | Admitting: Internal Medicine

## 2013-06-18 ENCOUNTER — Ambulatory Visit: Payer: Medicare Other | Admitting: Internal Medicine

## 2013-08-06 ENCOUNTER — Ambulatory Visit: Payer: Medicare Other | Admitting: Internal Medicine

## 2013-09-10 ENCOUNTER — Telehealth: Payer: Self-pay | Admitting: Internal Medicine

## 2013-09-10 ENCOUNTER — Emergency Department: Payer: Self-pay | Admitting: Emergency Medicine

## 2013-09-10 LAB — BASIC METABOLIC PANEL
Anion Gap: 5 — ABNORMAL LOW (ref 7–16)
BUN: 8 mg/dL (ref 7–18)
CREATININE: 0.95 mg/dL (ref 0.60–1.30)
Calcium, Total: 9 mg/dL (ref 8.5–10.1)
Chloride: 102 mmol/L (ref 98–107)
Co2: 31 mmol/L (ref 21–32)
EGFR (Non-African Amer.): 52 — ABNORMAL LOW
Glucose: 96 mg/dL (ref 65–99)
OSMOLALITY: 274 (ref 275–301)
Potassium: 3.2 mmol/L — ABNORMAL LOW (ref 3.5–5.1)
Sodium: 138 mmol/L (ref 136–145)

## 2013-09-10 LAB — CBC
HCT: 40 % (ref 35.0–47.0)
HGB: 12.3 g/dL (ref 12.0–16.0)
MCH: 25.7 pg — AB (ref 26.0–34.0)
MCHC: 30.8 g/dL — ABNORMAL LOW (ref 32.0–36.0)
MCV: 83 fL (ref 80–100)
Platelet: 324 10*3/uL (ref 150–440)
RBC: 4.8 10*6/uL (ref 3.80–5.20)
RDW: 15.6 % — ABNORMAL HIGH (ref 11.5–14.5)
WBC: 4.3 10*3/uL (ref 3.6–11.0)

## 2013-09-10 LAB — SEDIMENTATION RATE: Erythrocyte Sed Rate: 47 mm/hr — ABNORMAL HIGH (ref 0–30)

## 2013-09-10 NOTE — Telephone Encounter (Signed)
Please advise 

## 2013-09-10 NOTE — Telephone Encounter (Signed)
Son notified 

## 2013-09-10 NOTE — Telephone Encounter (Signed)
If not eating, light headed and dizzy, I agree with evaluation today.  Since I am unable to work in today, I recommend acute care today and then can f/u if needed.

## 2013-09-10 NOTE — Telephone Encounter (Signed)
Patient Information:  Caller Name: Ronalee Belts  Phone: [phone]  Patient: Taylor Watson  Gender: Female  DOB: 12/23/1920  Age: 80 Years  PCP: [name]  Office Follow Up:  Does the office need to follow up with this patient?: Yes  Instructions For The Office: No appts. available at the [facility]. Caller declines appt. at another office location. Please return call to Grandson/Mike at [phone] regarding possible work in appt.  RN Note:  Harrel Carina states patient has had loss of appetite, feeling lightheaded, intermittent nausea, intermittent sharp temporal headache X 2 weeks. Caller states patient's gait is unsteady X 2 weeks. Afebrile. Patient is taking fluids well. Urinating normally for patient. Patient denies headache 09/10/13. Denies numbness, tingling or paralysis. Caller states patient states she also has intermittnet right knee pain and her knee "gives out." Denies any falling. Care advice given per guidelines. Grandson advised for patient to change positions slowly, with assistance. Protect patient from falling. Advised to encourage small meals frequently, increased fluids. Call back parameters reviewed. Caller verbalizes understanding. No appts. available at the [facility]. Caller declines appt. at another office location. Please return call to Grandson/Mike at [phone] regarding possible work in appt.   Symptoms  Reason For Call & Symptoms: Not eating, lightheaded, dizziness, headache  Reviewed Health History In EMR: Yes  Reviewed Medications In EMR: Yes  Reviewed Allergies In EMR: Yes  Reviewed Surgeries / Procedures: Yes  Date of Onset of Symptoms: 08/27/2013  Guideline(s) Used:  Headache  Disposition Per Guideline:   See Today in Office  Reason For Disposition Reached:   Patient wants to be seen  Advice Given:  N/A  Patient Will Follow Care Advice:  YES

## 2013-10-04 ENCOUNTER — Encounter: Payer: Self-pay | Admitting: Internal Medicine

## 2013-10-04 ENCOUNTER — Ambulatory Visit (INDEPENDENT_AMBULATORY_CARE_PROVIDER_SITE_OTHER): Payer: Medicare Other | Admitting: Internal Medicine

## 2013-10-04 VITALS — BP 120/80 | HR 97 | Temp 98.3°F | Ht 63.5 in | Wt 136.5 lb

## 2013-10-04 DIAGNOSIS — I10 Essential (primary) hypertension: Secondary | ICD-10-CM

## 2013-10-04 DIAGNOSIS — R739 Hyperglycemia, unspecified: Secondary | ICD-10-CM

## 2013-10-04 DIAGNOSIS — R220 Localized swelling, mass and lump, head: Secondary | ICD-10-CM

## 2013-10-04 DIAGNOSIS — I639 Cerebral infarction, unspecified: Secondary | ICD-10-CM

## 2013-10-04 DIAGNOSIS — F411 Generalized anxiety disorder: Secondary | ICD-10-CM

## 2013-10-04 DIAGNOSIS — K625 Hemorrhage of anus and rectum: Secondary | ICD-10-CM

## 2013-10-04 DIAGNOSIS — E7800 Pure hypercholesterolemia, unspecified: Secondary | ICD-10-CM

## 2013-10-04 DIAGNOSIS — I6350 Cerebral infarction due to unspecified occlusion or stenosis of unspecified cerebral artery: Secondary | ICD-10-CM

## 2013-10-04 DIAGNOSIS — R634 Abnormal weight loss: Secondary | ICD-10-CM

## 2013-10-04 DIAGNOSIS — R7309 Other abnormal glucose: Secondary | ICD-10-CM

## 2013-10-04 DIAGNOSIS — R221 Localized swelling, mass and lump, neck: Secondary | ICD-10-CM

## 2013-10-04 DIAGNOSIS — F419 Anxiety disorder, unspecified: Secondary | ICD-10-CM

## 2013-10-04 LAB — COMPREHENSIVE METABOLIC PANEL
ALK PHOS: 95 U/L (ref 39–117)
ALT: 9 U/L (ref 0–35)
AST: 15 U/L (ref 0–37)
Albumin: 3.6 g/dL (ref 3.5–5.2)
BILIRUBIN TOTAL: 0.8 mg/dL (ref 0.2–1.2)
BUN: 10 mg/dL (ref 6–23)
CO2: 33 mEq/L — ABNORMAL HIGH (ref 19–32)
CREATININE: 0.8 mg/dL (ref 0.4–1.2)
Calcium: 9.4 mg/dL (ref 8.4–10.5)
Chloride: 98 mEq/L (ref 96–112)
GFR: 68.22 mL/min (ref >60.00)
Glucose, Bld: 91 mg/dL (ref 70–99)
Potassium: 3.5 mEq/L (ref 3.5–5.1)
SODIUM: 138 meq/L (ref 135–145)
TOTAL PROTEIN: 6.7 g/dL (ref 6.0–8.3)

## 2013-10-04 LAB — CBC WITH DIFFERENTIAL/PLATELET
BASOS ABS: 0 10*3/uL (ref 0.0–0.1)
Basophils Relative: 0.6 % (ref 0.0–3.0)
Eosinophils Absolute: 0.2 10*3/uL (ref 0.0–0.7)
Eosinophils Relative: 3.4 % (ref 0.0–5.0)
HEMATOCRIT: 39.3 % (ref 36.0–46.0)
Hemoglobin: 12.4 g/dL (ref 12.0–15.0)
LYMPHS ABS: 1.3 10*3/uL (ref 0.7–4.0)
Lymphocytes Relative: 29.3 % (ref 12.0–46.0)
MCHC: 31.6 g/dL (ref 30.0–36.0)
MCV: 81.9 fl (ref 78.0–100.0)
MONO ABS: 0.3 10*3/uL (ref 0.1–1.0)
Monocytes Relative: 6.1 % (ref 3.0–12.0)
NEUTROS ABS: 2.7 10*3/uL (ref 1.4–7.7)
Neutrophils Relative %: 60.6 % (ref 43.0–77.0)
Platelets: 316 10*3/uL (ref 150.0–400.0)
RBC: 4.8 Mil/uL (ref 3.87–5.11)
RDW: 15.3 % (ref 11.5–15.5)
WBC: 4.5 10*3/uL (ref 4.0–10.5)

## 2013-10-04 LAB — HEMOGLOBIN A1C: Hgb A1c MFr Bld: 5.7 % (ref 4.6–6.5)

## 2013-10-04 LAB — TSH: TSH: 1.89 u[IU]/mL (ref 0.35–4.50)

## 2013-10-04 NOTE — Progress Notes (Signed)
Pre visit review using our clinic review tool, if applicable. No additional management support is needed unless otherwise documented below in the visit note. 

## 2013-10-05 ENCOUNTER — Encounter: Payer: Self-pay | Admitting: *Deleted

## 2013-10-07 ENCOUNTER — Encounter: Payer: Self-pay | Admitting: Internal Medicine

## 2013-10-07 DIAGNOSIS — D3610 Benign neoplasm of peripheral nerves and autonomic nervous system, unspecified: Secondary | ICD-10-CM | POA: Insufficient documentation

## 2013-10-07 NOTE — Assessment & Plan Note (Addendum)
Large lower right neck nodule present.  Will have ENT evaluate for possible biopsy.

## 2013-10-07 NOTE — Assessment & Plan Note (Signed)
Does well on Lexapro.   Follow.

## 2013-10-07 NOTE — Assessment & Plan Note (Signed)
Low cholesterol diet and exercise.  Continue lipitor.  Follow lipid panel and liver function.

## 2013-10-07 NOTE — Assessment & Plan Note (Signed)
Weight decreased as outlined.  Check cbc, met c and tsh.  Refer for evaluation of the neck nodule.  She was having rectal bleeding.  Was referred to GI.  See their note for details.  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  Discussed with vascular surgery.  No further w/up or treatment for this.  Continue daily aspirin.  She had calcified fibroids and multiple sigmoid colon diverticula.  Stable intrahepatic and extrahepatic biliary ductal dilatation.  Stable left hepatic lobe cyst.  No perirectal inflammation.  Discussed further w/up with her today.  She declines colonoscopy at this time.  Follow closely.

## 2013-10-07 NOTE — Assessment & Plan Note (Signed)
Blood pressure stable.  Same medication regimen.  Follow.

## 2013-10-07 NOTE — Assessment & Plan Note (Signed)
Was referred to GI.  See their note for details.  CT as outlined.  She declines colonoscopy at this time.  No further bleeding.  Follow.

## 2013-10-07 NOTE — Progress Notes (Signed)
  Subjective:    Patient ID: Taylor Watson, female    DOB: January 30, 1919, 80 y.o.   MRN: [id number]  HPI 80 year old female with past history of hypertension, hypercholesterolemia and previous CVA who comes in today for a scheduled follow up.   She reports some decreased appetite.  No nausea or vomiting.  Breathing stable.  No chest pain or tightness.   No acid reflux.  Some constipation.  Has not noticed any further bleeding.  Has had significant weight loss.  Was 151 pounds in 12/14.  Now 136 pounds.   No headache now.  States had headache two weeks ago.       Past Medical History  Diagnosis Date  . Hypertension   . CVA (cerebral vascular accident)   . Anemia   . Hypercholesterolemia   . GERD (gastroesophageal reflux disease)   . Allergy   . Hx: UTI (urinary tract infection)   . History of blood transfusion   . History of kidney stones     Outpatient Encounter Prescriptions as of 10/04/2013  Medication Sig  . amLODipine (NORVASC) 5 MG tablet TAKE ONE TABLET BY MOUTH EVERY DAY -DO NOT TAKE WITH 10MG -STOPPING 10MG  AMLODIPINE.  Marland Kitchen aspirin 81 MG tablet Take 81 mg by mouth daily.  Marland Kitchen escitalopram (LEXAPRO) 10 MG tablet Take 0.5 tablets (5 mg total) by mouth daily. 1/2 tablet q day  . fluticasone (FLONASE) 50 MCG/ACT nasal spray USE 2 PUFFS IN EACH NOSTRIL EVERY 24 HOURS  . latanoprost (XALATAN) 0.005 % ophthalmic solution Place 1 drop into the left eye at bedtime.  Marland Kitchen omeprazole (PRILOSEC) 20 MG capsule TAKE ONE CAPSULE BY MOUTH EVERY DAY    Review of Systems Patient denies any headache, lightheadedness or dizziness.  No significant sinus or allergy symptoms.  No chest pain, tightness or palpitations.  No increased shortness of breath, cough or congestion.  No nausea or vomiting.  No acid reflux.  Does report decreased appetite.  Weight loss.   No abdominal pain or cramping.  No significant bowel change, such as diarrhea, BRBPR or melana. Does report some constipation.   No urine change.    Handling stress well.       Objective:   Physical Exam  Filed Vitals:   10/04/13 1400  BP: 120/80  Pulse: 97  Temp: 98.3 F (36.8 C)   Blood pressure recheck:  120/58, pulse 74, pulse ox 10-29%  80 year old female in no acute distress.   HEENT:  Nares- clear.  Oropharynx - without lesions. NECK:  Supple.  Nontender.  No audible bruit.  Palpable lower right neck nodule.  Non tender.   HEART:  Appears to be regular. LUNGS:  No crackles or wheezing audible.  Respirations even and unlabored.  RADIAL PULSE:  Equal bilaterally.  ABDOMEN:  Soft, nontender.  Bowel sounds present and normal.  No audible abdominal bruit.   RECTAL:  Increased peri rectal tissue.  Heme negative.   EXTREMITIES:  No increased edema present.  DP pulses palpable and equal bilaterally.           Assessment & Plan:  GI.  She declines colonoscopy or further GI w/up at this time.    PULMONARY.  No cough or congestion.  Feels breathing stable.  Follow.   CARDIOVASCULAR.  Feels stable.  Sees Dr Ubaldo Glassing.  Follow.  HEALTH MAINTENANCE.  Declines physical.  Declines GU, breast and rectal exams.  Declines further GI evaluation, mammogram and scanning.

## 2013-10-07 NOTE — Assessment & Plan Note (Signed)
On aspirin.  No reoccurring symptoms.  Follow.

## 2013-10-22 ENCOUNTER — Ambulatory Visit: Payer: Self-pay | Admitting: Otolaryngology

## 2013-10-29 ENCOUNTER — Other Ambulatory Visit: Payer: Self-pay | Admitting: Internal Medicine

## 2013-11-06 ENCOUNTER — Ambulatory Visit (INDEPENDENT_AMBULATORY_CARE_PROVIDER_SITE_OTHER): Payer: Medicare Other | Admitting: *Deleted

## 2013-11-06 DIAGNOSIS — Z111 Encounter for screening for respiratory tuberculosis: Secondary | ICD-10-CM

## 2013-11-06 LAB — CBC AND DIFFERENTIAL
HCT: 36 % (ref 36–46)
Hemoglobin: 12 g/dL (ref 12.0–16.0)
NEUTROS ABS: 2 /uL
PLATELETS: 328 10*3/uL (ref 150–399)
WBC: 4.8 10^3/mL

## 2013-11-06 LAB — LIPID PANEL
Cholesterol: 224 mg/dL — AB (ref 0–200)
HDL: 64 mg/dL (ref 35–70)
LDL Cholesterol: 141 mg/dL
LDl/HDL Ratio: 3.5
Triglycerides: 97 mg/dL (ref 40–160)

## 2013-11-06 LAB — HEPATIC FUNCTION PANEL
ALT: 6 U/L — AB (ref 7–35)
AST: 12 U/L — AB (ref 13–35)
Alkaline Phosphatase: 131 U/L — AB (ref 25–125)
Bilirubin, Total: 0.7 mg/dL

## 2013-11-06 LAB — BASIC METABOLIC PANEL
BUN: 15 mg/dL (ref 4–21)
CREATININE: 0.8 mg/dL (ref 0.5–1.1)
GLUCOSE: 94 mg/dL
Potassium: 3.5 mmol/L (ref 3.4–5.3)
Sodium: 144 mmol/L (ref 137–147)

## 2013-11-06 LAB — TSH: TSH: 4.32 u[IU]/mL (ref 0.41–5.90)

## 2013-11-08 ENCOUNTER — Other Ambulatory Visit: Payer: Self-pay | Admitting: Internal Medicine

## 2013-11-09 ENCOUNTER — Other Ambulatory Visit: Payer: Self-pay | Admitting: Internal Medicine

## 2013-11-09 LAB — TB SKIN TEST
Induration: 0 mm
TB SKIN TEST: NEGATIVE

## 2013-11-13 ENCOUNTER — Ambulatory Visit: Payer: Medicare Other

## 2013-12-14 ENCOUNTER — Ambulatory Visit: Payer: Self-pay | Admitting: Otolaryngology

## 2013-12-25 ENCOUNTER — Ambulatory Visit: Payer: Self-pay | Admitting: Otolaryngology

## 2013-12-28 LAB — PATHOLOGY REPORT

## 2014-01-09 ENCOUNTER — Ambulatory Visit (INDEPENDENT_AMBULATORY_CARE_PROVIDER_SITE_OTHER): Payer: Medicare Other | Admitting: Adult Health

## 2014-01-09 ENCOUNTER — Encounter: Payer: Self-pay | Admitting: Adult Health

## 2014-01-09 DIAGNOSIS — Z0000 Encounter for general adult medical examination without abnormal findings: Secondary | ICD-10-CM

## 2014-01-09 MED ORDER — ESCITALOPRAM OXALATE 10 MG PO TABS: 5.0000 mg | ORAL_TABLET | Freq: Every day | ORAL | Status: DC

## 2014-01-09 MED ORDER — OMEPRAZOLE 20 MG PO CPDR: 20.0000 mg | DELAYED_RELEASE_CAPSULE | Freq: Every day | ORAL | Status: DC

## 2014-01-09 MED ORDER — AMLODIPINE BESYLATE 5 MG PO TABS: 5.0000 mg | ORAL_TABLET | Freq: Every day | ORAL | Status: DC

## 2014-01-09 NOTE — Progress Notes (Signed)
Pre visit review using our clinic review tool, if applicable. No additional management support is needed unless otherwise documented below in the visit note. 

## 2014-01-09 NOTE — Patient Instructions (Signed)
  You had your Medicare Wellness Screening today.  You did not want the flu vaccine or the pneumonia vaccine. Please think about this.  Also recommend that you have the tetanus vaccine.  Schedule a follow up appointment with Dr. Nicki Reaper in January or February.

## 2014-01-09 NOTE — Progress Notes (Signed)
Subjective:    Taylor Watson is a 80 y.o. female who presents for Medicare Annual/Subsequent preventive examination.  Preventive Screening-Counseling & Management  Tobacco History  Smoking status  . Former Smoker  Smokeless tobacco  . Former Systems developer  . Types: Chew     Problems Prior to Visit 1.   Current Problems (verified) Patient Active Problem List   Diagnosis Date Noted  . Neck nodule 10/07/2013  . Rectal bleeding 04/03/2013  . Light headedness 01/19/2013  . Loss of weight 10/22/2012  . Hypercholesterolemia 10/22/2012  . CVA (cerebral vascular accident) 10/22/2012  . Anxiety 10/22/2012  . Essential hypertension, benign 10/22/2012    Medications Prior to Visit Current Outpatient Prescriptions on File Prior to Visit  Medication Sig Dispense Refill  . amLODipine (NORVASC) 5 MG tablet TAKE ONE TABLET BY MOUTH EVERY DAY STOP TAKING 10MG  AMLODIPINE  30 tablet  5  . aspirin 81 MG tablet Take 81 mg by mouth daily.      Marland Kitchen escitalopram (LEXAPRO) 10 MG tablet Take 0.5 tablets (5 mg total) by mouth daily. 1/2 tablet q day  30 tablet  3  . omeprazole (PRILOSEC) 20 MG capsule TAKE ONE CAPSULE BY MOUTH EVERY DAY  30 capsule  5   No current facility-administered medications on file prior to visit.    Current Medications (verified) Current Outpatient Prescriptions  Medication Sig Dispense Refill  . amLODipine (NORVASC) 5 MG tablet TAKE ONE TABLET BY MOUTH EVERY DAY STOP TAKING 10MG  AMLODIPINE  30 tablet  5  . aspirin 81 MG tablet Take 81 mg by mouth daily.      . Brinzolamide-Brimonidine (SIMBRINZA) 1-0.2 % SUSP Apply 1 drop to eye 2 (two) times daily.      Marland Kitchen escitalopram (LEXAPRO) 10 MG tablet Take 0.5 tablets (5 mg total) by mouth daily. 1/2 tablet q day  30 tablet  3  . omeprazole (PRILOSEC) 20 MG capsule TAKE ONE CAPSULE BY MOUTH EVERY DAY  30 capsule  5   No current facility-administered medications for this visit.     Allergies (verified) Dyazide; Monopril; Toprol  xl; and Verapamil   PAST HISTORY  Family History Family History  Problem Relation Age of Onset  . Cancer Mother     unknown type  . Hypertension Mother   . Cancer Father     unknown type  . Breast cancer Daughter   . Colon cancer      grandfather    Social History History  Substance Use Topics  . Smoking status: Former Research scientist (life sciences)  . Smokeless tobacco: Former Systems developer    Types: Chew  . Alcohol Use: No     Are there smokers in your home (other than you)? No  Risk Factors Current exercise habits: The patient does not participate in regular exercise at present.  Dietary issues discussed: Follows healthy diet   Cardiac risk factors: advanced age (older than 35 for men, 51 for women), hypertension and sedentary lifestyle.  Depression Screen (Note: if answer to either of the following is "Yes", a more complete depression screening is indicated)   Over the past two weeks, have you felt down, depressed or hopeless? No  Over the past two weeks, have you felt little interest or pleasure in doing things? No  Have you lost interest or pleasure in daily life? No  Do you often feel hopeless? No  Do you cry easily over simple problems? No  Activities of Daily Living In your present state of health, do you have  any difficulty performing the following activities?:  Driving? Yes Managing money?  No Feeding yourself? No Getting from bed to chair? NoNo exam performed today, medicare wellness. Climbing a flight of stairs? No Preparing food and eating?: No Bathing or showering? No Getting dressed: No Getting to the toilet? No Using the toilet:No Moving around from place to place: No In the past year have you fallen or had a near fall?:Yes   Are you sexually active?  No  Do you have more than one partner?  No  Hearing Difficulties: No Do you often ask people to speak up or repeat themselves? No Do you experience ringing or noises in your ears? Yes Do you have difficulty understanding  soft or whispered voices? No   Do you feel that you have a problem with memory? No  Do you often misplace items? No  Do you feel safe at home?  Yes  Cognitive Testing  Alert? Yes  Normal Appearance?Yes  Oriented to person? Yes  Place? Yes   Time? Yes  Recall of three objects?  Yes  Can perform simple calculations? Yes  Displays appropriate judgment?Yes  Can read the correct time from a watch face?Yes   Advanced Directives have been discussed with the patient? No  List the Names of Other Physician/Practitioners you currently use: 1.  Dr. Richardson Landry - ENT 2.  Dr Leodis Sias - Optometrist   Indicate any recent Medical Services you may have received from other than [facility] providers in the past year (date may be approximate).  Immunization History  Administered Date(s) Administered  . PPD Test 11/06/2013    Screening Tests Health Maintenance  Topic Date Due  . Foot Exam  12/25/1930  . Ophthalmology Exam  12/25/1930  . Urine Microalbumin  12/25/1930  . Tetanus/tdap  12/25/1939  . Colonoscopy  12/25/1970  . Zostavax  12/24/1980  . Pneumococcal Polysaccharide Vaccine Age 67 And Over  12/24/1985  . Influenza Vaccine  12/01/2013  . Hemoglobin A1c  04/05/2014    All answers were reviewed with the patient and necessary referrals were made:  Rey,Raquel, NP   01/09/2014   History reviewed: allergies, current medications, past family history, past medical history, past social history, past surgical history and problem list  Review of Systems No ROS. Medicare Wellness    Objective:     Vision by Snellen chart: right [id number] declines measurement, left eye:Pt declines measurement. Has appt next month  There is no weight on file to calculate BMI. There were no vitals taken for this visit.  No exam performed today, Medicare Wellness.     Assessment:      This is a routine wellness  examination for this patient . I reviewed all health maintenance protocols including mammography,  colonoscopy, bone density Needed referrals were placed. Age and diagnosis  appropriate screening labs were ordered. Her immunization history was reviewed and appropriate vaccinations were ordered. Her current medications and allergies were reviewed and needed refills of her chronic medications were ordered. The plan for yearly health maintenance was discussed all orders and referrals were made as appropriate.      Plan:     During the course of the visit the patient was educated and counseled about appropriate screening and preventive services including:    Pneumococcal vaccine   Influenza vaccine  Td vaccine  Bone densitometry screening  Medication Refills  Pt does not wish to have any vaccinations or bone density  Diet review for nutrition referral? Yes ____  Not Indicated ____  Patient Instructions (the written plan) was given to the patient.  Medicare Attestation I have personally reviewed: The patient's medical and social history Their use of alcohol, tobacco or illicit drugs Their current medications and supplements The patient's functional ability including ADLs,fall risks, home safety risks, cognitive, and hearing and visual impairment Diet and physical activities Evidence for depression or mood disorders  The patient's weight, height, BMI, and visual acuity have been recorded in the chart.  I have made referrals, counseling, and provided education to the patient based on review of the above and I have provided the patient with a written personalized care plan for preventive services.     Rey,Raquel, NP   01/09/2014

## 2014-02-11 ENCOUNTER — Telehealth: Payer: Self-pay

## 2014-02-11 NOTE — Telephone Encounter (Signed)
Appt scheduled & pt notified

## 2014-02-11 NOTE — Telephone Encounter (Signed)
Please advise 

## 2014-02-11 NOTE — Telephone Encounter (Signed)
Yes, have her come in and we can determine if any changes need to be made.  Thanks.

## 2014-02-11 NOTE — Telephone Encounter (Signed)
The patient called and wanted to report that her blood pressure was 96/50.  She wanted to know if "there was anything she should be doing"

## 2014-02-11 NOTE — Telephone Encounter (Signed)
Has she been checking her blood pressure and it is remaining this level or is this the only reading?  Where/who took the bp.  I can see her on 02/14/14 at 11:45.  Needs to monitor her blood pressure.  If has readings remaining this level, will need to adjust her medication.  Any other symptoms?

## 2014-02-11 NOTE — Telephone Encounter (Signed)
Pt denies any symptoms, she just said that its was checked at her eye appt on Thursday & she did know if it was good or bad. Please advise if she still needs to be seen.

## 2014-02-14 ENCOUNTER — Ambulatory Visit (INDEPENDENT_AMBULATORY_CARE_PROVIDER_SITE_OTHER): Payer: Medicare Other | Admitting: Internal Medicine

## 2014-02-14 ENCOUNTER — Encounter: Payer: Self-pay | Admitting: Internal Medicine

## 2014-02-14 VITALS — BP 126/70 | HR 85 | Temp 98.1°F | Ht 63.5 in | Wt 132.5 lb

## 2014-02-14 DIAGNOSIS — R221 Localized swelling, mass and lump, neck: Secondary | ICD-10-CM

## 2014-02-14 DIAGNOSIS — R634 Abnormal weight loss: Secondary | ICD-10-CM

## 2014-02-14 DIAGNOSIS — K625 Hemorrhage of anus and rectum: Secondary | ICD-10-CM

## 2014-02-14 DIAGNOSIS — E7800 Pure hypercholesterolemia, unspecified: Secondary | ICD-10-CM

## 2014-02-14 DIAGNOSIS — I639 Cerebral infarction, unspecified: Secondary | ICD-10-CM

## 2014-02-14 DIAGNOSIS — F419 Anxiety disorder, unspecified: Secondary | ICD-10-CM

## 2014-02-14 DIAGNOSIS — I10 Essential (primary) hypertension: Secondary | ICD-10-CM

## 2014-02-14 LAB — BASIC METABOLIC PANEL
BUN: 13 mg/dL (ref 6–23)
CO2: 33 meq/L — AB (ref 19–32)
CREATININE: 0.9 mg/dL (ref 0.4–1.2)
Calcium: 9.7 mg/dL (ref 8.4–10.5)
Chloride: 100 mEq/L (ref 96–112)
GFR: 79.17 mL/min (ref >60.00)
GLUCOSE: 78 mg/dL (ref 70–99)
Potassium: 4 mEq/L (ref 3.5–5.1)
Sodium: 140 mEq/L (ref 135–145)

## 2014-02-14 LAB — HEPATIC FUNCTION PANEL
ALT: 6 U/L (ref 0–35)
AST: 12 U/L (ref 0–37)
Albumin: 3.3 g/dL — ABNORMAL LOW (ref 3.5–5.2)
Alkaline Phosphatase: 114 U/L (ref 39–117)
BILIRUBIN DIRECT: 0.1 mg/dL (ref 0.0–0.3)
TOTAL PROTEIN: 7.5 g/dL (ref 6.0–8.3)
Total Bilirubin: 1 mg/dL (ref 0.2–1.2)

## 2014-02-14 NOTE — Progress Notes (Signed)
Pre visit review using our clinic review tool, if applicable. No additional management support is needed unless otherwise documented below in the visit note. 

## 2014-02-15 ENCOUNTER — Encounter: Payer: Self-pay | Admitting: *Deleted

## 2014-02-23 NOTE — Assessment & Plan Note (Signed)
Does well on Lexapro.   Follow.

## 2014-02-23 NOTE — Assessment & Plan Note (Signed)
On aspirin.  No reoccurring symptoms.  Follow.

## 2014-02-23 NOTE — Progress Notes (Signed)
Subjective:    Patient ID: Taylor Watson, female    DOB: May 11, 1918, 80 y.o.   MRN: [id number]  HPI 80 year old female with past history of hypertension, hypercholesterolemia and previous CVA who comes in today for a scheduled follow up.   No nausea or vomiting.  Breathing stable.  No chest pain or tightness.   No acid reflux.  Has not noticed any further bleeding.  Has had significant weight loss.  Was 151 pounds in 12/14.  Now 132 pounds.  She reports she is eating.  Desires no further w/up.        Past Medical History  Diagnosis Date  . Hypertension   . CVA (cerebral vascular accident)   . Anemia   . Hypercholesterolemia   . GERD (gastroesophageal reflux disease)   . Allergy   . Hx: UTI (urinary tract infection)   . History of blood transfusion   . History of kidney stones     Outpatient Encounter Prescriptions as of 02/14/2014  Medication Sig  . amLODipine (NORVASC) 5 MG tablet Take 1 tablet (5 mg total) by mouth daily.  Marland Kitchen aspirin 81 MG tablet Take 81 mg by mouth daily.  . Brinzolamide-Brimonidine (SIMBRINZA) 1-0.2 % SUSP Apply 1 drop to eye 2 (two) times daily.  Marland Kitchen escitalopram (LEXAPRO) 10 MG tablet Take 0.5 tablets (5 mg total) by mouth daily. 1/2 tablet q day  . omeprazole (PRILOSEC) 20 MG capsule Take 1 capsule (20 mg total) by mouth daily.    Review of Systems Patient denies any headache, lightheadedness or dizziness.  No significant sinus or allergy symptoms.  No chest pain, tightness or palpitations.  No increased shortness of breath, cough or congestion.  No nausea or vomiting.  No acid reflux.  Weight loss as outlined.  Eating.  No abdominal pain or cramping.  No significant bowel change, such as diarrhea, BRBPR or melana.   No urine change.   Handling stress well.       Objective:   Physical Exam  Filed Vitals:   02/14/14 1128  BP: 126/70  Pulse: 85  Temp: 98.1 F (36.7 C)   Blood pressure recheck:  132/64, pulse 67  80 year old female in no acute  distress.   HEENT:  Nares- clear.  Oropharynx - without lesions. NECK:  Supple.  Nontender.  No audible bruit.  Palpable lower right neck nodule.  Non tender.   HEART:  Appears to be regular. LUNGS:  No crackles or wheezing audible.  Respirations even and unlabored.  RADIAL PULSE:  Equal bilaterally.  ABDOMEN:  Soft, nontender.  Bowel sounds present and normal.  No audible abdominal bruit.  EXTREMITIES:  No increased edema present.  DP pulses palpable and equal bilaterally.           Assessment & Plan:  GI.  She declines colonoscopy or further GI w/up at this time.    PULMONARY.  No cough or congestion.  Feels breathing stable.  Follow.   CARDIOVASCULAR.  Feels stable.  Sees Dr Ubaldo Glassing.  Follow.  HEALTH MAINTENANCE.  Declines physical.  Declines GU, breast and rectal exams.  Declines further GI evaluation, mammogram and scanning.    Problem List Items Addressed This Visit   Anxiety     Does well on Lexapro.   Follow.      CVA (cerebral vascular accident) - Primary     On aspirin.  No reoccurring symptoms.  Follow.      Essential hypertension, benign  Blood pressure stable.  Same medication regimen.  Follow.  Was noted to be low at the opthalmology office.  Check here ok.  Follow.       Relevant Orders      Basic metabolic panel (Completed)   Hypercholesterolemia     Low cholesterol diet and exercise.  Continue lipitor.  Follow lipid panel and liver function.      Relevant Orders      Hepatic function panel (Completed)   Loss of weight     Weight decreased as outlined.   She was having rectal bleeding.  Was referred to GI.  See their note for details.  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  Discussed with vascular surgery.  No further w/up or treatment for this.  Continue daily aspirin.  She had calcified fibroids and multiple sigmoid colon diverticula.  Stable intrahepatic and extrahepatic biliary ductal dilatation.  Stable left hepatic lobe cyst.  No  perirectal inflammation.  Discussed further w/up with her today.  She declines colonoscopy at this time.  Follow closely.       Neck nodule     Saw ENT.  Had Ct neck.  Obtain results.  States everything checked out fine.       Rectal bleeding     Was referred to GI.  See their note for details.  CT as outlined.  She declines colonoscopy at this time.  No further bleeding.  Follow.

## 2014-02-23 NOTE — Assessment & Plan Note (Signed)
Saw ENT.  Had Ct neck.  Obtain results.  States everything checked out fine.

## 2014-02-23 NOTE — Assessment & Plan Note (Addendum)
Blood pressure stable.  Same medication regimen.  Follow.  Was noted to be low at the opthalmology office.  Check here ok.  Follow.

## 2014-02-23 NOTE — Assessment & Plan Note (Signed)
Low cholesterol diet and exercise.  Continue lipitor.  Follow lipid panel and liver function.

## 2014-02-23 NOTE — Assessment & Plan Note (Signed)
Was referred to GI.  See their note for details.  CT as outlined.  She declines colonoscopy at this time.  No further bleeding.  Follow.

## 2014-02-23 NOTE — Assessment & Plan Note (Signed)
Weight decreased as outlined.   She was having rectal bleeding.  Was referred to GI.  See their note for details.  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  Discussed with vascular surgery.  No further w/up or treatment for this.  Continue daily aspirin.  She had calcified fibroids and multiple sigmoid colon diverticula.  Stable intrahepatic and extrahepatic biliary ductal dilatation.  Stable left hepatic lobe cyst.  No perirectal inflammation.  Discussed further w/up with her today.  She declines colonoscopy at this time.  Follow closely.

## 2014-06-11 ENCOUNTER — Ambulatory Visit: Payer: Medicare Other | Admitting: Internal Medicine

## 2014-06-13 DIAGNOSIS — H4011X3 Primary open-angle glaucoma, severe stage: Secondary | ICD-10-CM | POA: Diagnosis not present

## 2014-06-13 DIAGNOSIS — H04129 Dry eye syndrome of unspecified lacrimal gland: Secondary | ICD-10-CM | POA: Diagnosis not present

## 2014-07-11 DIAGNOSIS — H4011X3 Primary open-angle glaucoma, severe stage: Secondary | ICD-10-CM | POA: Diagnosis not present

## 2014-08-05 ENCOUNTER — Encounter: Payer: Self-pay | Admitting: Internal Medicine

## 2014-08-05 ENCOUNTER — Ambulatory Visit (INDEPENDENT_AMBULATORY_CARE_PROVIDER_SITE_OTHER): Payer: Medicare Other | Admitting: Internal Medicine

## 2014-08-05 VITALS — BP 110/60 | HR 72 | Temp 98.2°F | Ht 63.5 in | Wt 130.4 lb

## 2014-08-05 DIAGNOSIS — R634 Abnormal weight loss: Secondary | ICD-10-CM

## 2014-08-05 DIAGNOSIS — F419 Anxiety disorder, unspecified: Secondary | ICD-10-CM

## 2014-08-05 DIAGNOSIS — I10 Essential (primary) hypertension: Secondary | ICD-10-CM | POA: Diagnosis not present

## 2014-08-05 DIAGNOSIS — I639 Cerebral infarction, unspecified: Secondary | ICD-10-CM

## 2014-08-05 DIAGNOSIS — K625 Hemorrhage of anus and rectum: Secondary | ICD-10-CM

## 2014-08-05 DIAGNOSIS — R221 Localized swelling, mass and lump, neck: Secondary | ICD-10-CM

## 2014-08-05 DIAGNOSIS — E7800 Pure hypercholesterolemia, unspecified: Secondary | ICD-10-CM

## 2014-08-05 MED ORDER — OMEPRAZOLE 20 MG PO CPDR
20.0000 mg | DELAYED_RELEASE_CAPSULE | Freq: Every day | ORAL | Status: DC
Start: 2014-08-05 — End: 2015-02-06

## 2014-08-05 NOTE — Progress Notes (Signed)
Pre visit review using our clinic review tool, if applicable. No additional management support is needed unless otherwise documented below in the visit note. 

## 2014-08-05 NOTE — Progress Notes (Signed)
Patient ID: Taylor Watson, female   DOB: [birth year], 81 y.o.   MRN: [id number]   Subjective:    Patient ID: Taylor Watson, female    DOB: November 24, 1918, 81 y.o.   MRN: [id number]  HPI  Patient here for a scheduled follow up.  States she is doing relatively well.  Feels anxious at times.   Only takes the lexapro every other day.  Feels may be worse on the days she does not take the lexapro.  No chest pain.  Breathing stable.  States she is eating and drinking well.  No nausea or vomiting.  Some constipation.  Discussed miralax.     Past Medical History  Diagnosis Date  . Hypertension   . CVA (cerebral vascular accident)   . Anemia   . Hypercholesterolemia   . GERD (gastroesophageal reflux disease)   . Allergy   . Hx: UTI (urinary tract infection)   . History of blood transfusion   . History of kidney stones     Outpatient Encounter Prescriptions as of 08/05/2014  Medication Sig  . amLODipine (NORVASC) 5 MG tablet Take 1 tablet (5 mg total) by mouth daily.  Marland Kitchen aspirin 81 MG tablet Take 81 mg by mouth daily.  . Brinzolamide-Brimonidine (SIMBRINZA) 1-0.2 % SUSP Apply 1 drop to eye 2 (two) times daily.  Marland Kitchen escitalopram (LEXAPRO) 10 MG tablet Take 0.5 tablets (5 mg total) by mouth daily. 1/2 tablet q day  . omeprazole (PRILOSEC) 20 MG capsule Take 1 capsule (20 mg total) by mouth daily.  . [DISCONTINUED] omeprazole (PRILOSEC) 20 MG capsule Take 1 capsule (20 mg total) by mouth daily.    Review of Systems  Constitutional: Negative for appetite change and unexpected weight change.  HENT: Negative for congestion and sinus pressure.   Respiratory: Negative for cough, chest tightness and shortness of breath.   Cardiovascular: Negative for chest pain, palpitations and leg swelling.  Gastrointestinal: Positive for constipation. Negative for nausea, vomiting, abdominal pain, diarrhea and blood in stool.  Neurological: Negative for dizziness, light-headedness and headaches.    Psychiatric/Behavioral:       Some increased anxiety as outlined.         Objective:    Physical Exam  BP 110/60 mmHg  Pulse 72  Temp(Src) 98.2 F (36.8 C) (Oral)  Ht 5' 3.5" (1.613 m)  Wt 130 lb 6 oz (59.138 kg)  BMI 22.73 kg/m2  SpO2 93% Wt Readings from Last 3 Encounters:  08/05/14 130 lb 6 oz (59.138 kg)  02/14/14 132 lb 8 oz (60.102 kg)  10/04/13 136 lb 8 oz (61.916 kg)     Lab Results  Component Value Date   WBC 4.5 08/05/2014   HGB 12.3 08/05/2014   HCT 38.2 08/05/2014   PLT 272.0 08/05/2014   GLUCOSE 88 08/05/2014   CHOL 224* 11/06/2013   TRIG 97 11/06/2013   HDL 64 11/06/2013   LDLDIRECT 163.5 01/12/2013   LDLCALC 141 11/06/2013   ALT 5 08/05/2014   AST 10 08/05/2014   NA 138 08/05/2014   K 4.5 08/05/2014   CL 102 08/05/2014   CREATININE 1.01 08/05/2014   BUN 11 08/05/2014   CO2 31 08/05/2014   TSH 2.26 08/05/2014   HGBA1C 5.7 10/04/2013       Assessment & Plan:   Problem List Items Addressed This Visit    Anxiety    Feels more anxiety.  States feels worse on days she does not take the lexapro.  Increase lexapro to 5mg   q day.  Follow.        CVA (cerebral vascular accident)    On aspirin.  No recurring symptoms.  Follow.       Essential hypertension, benign    Blood pressure doing well.  Follow pressures.  Follow metabolic panel.       Relevant Orders   Basic metabolic panel (Completed)   Hypercholesterolemia    Low cholesterol diet and exercise.  Follow lipid panel.       Relevant Orders   Hepatic function panel (Completed)   Loss of weight - Primary    Continued weight loss.  Discussed with her today.  She is eating.  No GI symptoms.  Discussed further w/up.  She declines.  Follow.        Relevant Orders   CBC with Differential/Platelet (Completed)   TSH (Completed)   Neck nodule    Brachial plexus lesion.  Stable.  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  Being followed by ENT.        Rectal bleeding     Was referred to GI.  See their notes for details.  She declines colonoscopy or further w/up.  No further bleeding.  Follow.          I spent 25 minutes with the patient and more than 50% of the time was spent in consultation regarding the above.     [name], MD

## 2014-08-06 LAB — CBC WITH DIFFERENTIAL/PLATELET
BASOS PCT: 1.1 % (ref 0.0–3.0)
Basophils Absolute: 0 10*3/uL (ref 0.0–0.1)
EOS PCT: 6.9 % — AB (ref 0.0–5.0)
Eosinophils Absolute: 0.3 10*3/uL (ref 0.0–0.7)
HCT: 38.2 % (ref 36.0–46.0)
HEMOGLOBIN: 12.3 g/dL (ref 12.0–15.0)
Lymphocytes Relative: 25.3 % (ref 12.0–46.0)
Lymphs Abs: 1.1 10*3/uL (ref 0.7–4.0)
MCHC: 32.2 g/dL (ref 30.0–36.0)
MCV: 78.5 fl (ref 78.0–100.0)
Monocytes Absolute: 0.2 10*3/uL (ref 0.1–1.0)
Monocytes Relative: 3.9 % (ref 3.0–12.0)
Neutro Abs: 2.8 10*3/uL (ref 1.4–7.7)
Neutrophils Relative %: 62.8 % (ref 43.0–77.0)
PLATELETS: 272 10*3/uL (ref 150.0–400.0)
RBC: 4.86 Mil/uL (ref 3.87–5.11)
RDW: 16.2 % — AB (ref 11.5–15.5)
WBC: 4.5 10*3/uL (ref 4.0–10.5)

## 2014-08-06 LAB — HEPATIC FUNCTION PANEL
ALK PHOS: 104 U/L (ref 39–117)
ALT: 5 U/L (ref 0–35)
AST: 10 U/L (ref 0–37)
Albumin: 3.9 g/dL (ref 3.5–5.2)
BILIRUBIN DIRECT: 0.1 mg/dL (ref 0.0–0.3)
TOTAL PROTEIN: 7 g/dL (ref 6.0–8.3)
Total Bilirubin: 0.8 mg/dL (ref 0.2–1.2)

## 2014-08-06 LAB — BASIC METABOLIC PANEL
BUN: 11 mg/dL (ref 6–23)
CHLORIDE: 102 meq/L (ref 96–112)
CO2: 31 mEq/L (ref 19–32)
Calcium: 9.6 mg/dL (ref 8.4–10.5)
Creatinine, Ser: 1.01 mg/dL (ref 0.40–1.20)
GFR: 65.69 mL/min (ref >60.00)
Glucose, Bld: 88 mg/dL (ref 70–99)
Potassium: 4.5 mEq/L (ref 3.5–5.1)
Sodium: 138 mEq/L (ref 135–145)

## 2014-08-06 LAB — TSH: TSH: 2.26 u[IU]/mL (ref 0.35–4.50)

## 2014-08-07 ENCOUNTER — Encounter: Payer: Self-pay | Admitting: *Deleted

## 2014-08-10 ENCOUNTER — Encounter: Payer: Self-pay | Admitting: Internal Medicine

## 2014-08-10 NOTE — Assessment & Plan Note (Signed)
Brachial plexus lesion.  Stable.  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  Being followed by ENT.

## 2014-08-10 NOTE — Assessment & Plan Note (Signed)
Low cholesterol diet and exercise.  Follow lipid panel.   

## 2014-08-10 NOTE — Assessment & Plan Note (Signed)
Feels more anxiety.  States feels worse on days she does not take the lexapro.  Increase lexapro to 5mg  q day.  Follow.

## 2014-08-10 NOTE — Assessment & Plan Note (Signed)
Continued weight loss.  Discussed with her today.  She is eating.  No GI symptoms.  Discussed further w/up.  She declines.  Follow.

## 2014-08-10 NOTE — Assessment & Plan Note (Signed)
Blood pressure doing well.  Follow pressures.  Follow metabolic panel.  

## 2014-08-10 NOTE — Assessment & Plan Note (Signed)
On aspirin.  No recurring symptoms.  Follow.

## 2014-08-10 NOTE — Assessment & Plan Note (Signed)
Was referred to GI.  See their notes for details.  She declines colonoscopy or further w/up.  No further bleeding.  Follow.

## 2014-08-25 NOTE — H&P (Signed)
PATIENT NAME:  Taylor Watson, Taylor Watson MR#:  [id number] DATE OF BIRTH:  04-03-1919  DATE OF ADMISSION:  09/20/2011  CHIEF COMPLAINT: Weakness today.   HISTORY OF PRESENT ILLNESS: 81 year old African American female with a history of hypertension, hyperlipidemia, CVA, presented to ED with generalized weakness, dizziness, nausea today Patient is alert, awake, oriented in no acute distress. According to her, she started to feel weak, dizzy, nausea today which lasted about one hour and resolved. Patient said she stopped Lexapro one week ago and the symptoms started today but she denies any chest pain, palpitation, orthopnea, or nocturnal dyspnea. No shortness of breath, cough, sputum, or hematemesis. Patient denies any abdominal pain, vomiting, or diarrhea. No fever or chills. No urinary problems.   PAST MEDICAL HISTORY:  1. Hypertension. 2. Hyperlipidemia. 3. CVA.   SOCIAL HISTORY: Denies any smoking, alcohol drinking, or illicit drugs.   PAST SURGICAL HISTORY: Cholecystectomy.   FAMILY HISTORY: No CVA, heart attack, hypertension. No diabetes.  ALLERGIES: No.    MEDICATIONS:  1. Aspirin 81 mg p.o. daily.  2. Lexapro 10 mg p.o. 1/2 tablet daily. 3. Lipitor 40 mg p.o. daily.  4. Norvasc 10 mg p.o. daily.  5. Omeprazole 20 mg p.o. daily.   REVIEW OF SYSTEMS: CONSTITUTIONAL: Patient denies any fever, chills. No headache but has dizziness and generalized weakness. No weight loss. HEENT: No double vision, blurred vision. No hearing loss. No epistaxis or postnasal drip. CARDIOVASCULAR: No chest pain, palpitation, orthopnea, or nocturnal dyspnea. No leg edema. PULMONARY: No cough, sputum, shortness of breath, or hematemesis. GASTROINTESTINAL: Positive for nausea but no vomiting or diarrhea. No melena or bloody stool. GENITOURINARY: No dysuria, hematuria. No incontinence. SKIN: No rash or jaundice. HEMATOLOGY: No easy bruising or bleeding. NEUROLOGY: No syncope, loss of consciousness or seizure.    PHYSICAL EXAMINATION:  VITAL SIGNS: Temperature 96.7, blood pressure 151/68, pulse 93, oxygen saturation 99% on room air.   GENERAL: Patient is alert, awake, oriented in no acute distress.   HEENT: Pupils round, equal, reactive to light, accommodation. Moist oral mucosa. Clear oropharynx.   NECK: Supple. No JVD or carotid bruits. No lymphadenopathy. No thyromegaly.   CARDIOVASCULAR: S1, S2 regular rate, rhythm. No murmurs, gallops.   PULMONARY: Bilateral air entry. No wheezing, rales.   ABDOMEN: Soft. No distention or tenderness. No organomegaly. Bowel sounds present.   EXTREMITIES: No edema, clubbing, or cyanosis. No calf tenderness.   SKIN: No rash or jaundice.   NEUROLOGIC: Alert and oriented x3. No focal deficit. Power 5/5. Sensation intact.   LABORATORY, DIAGNOSTIC AND RADIOLOGICAL DATA:  Chest x-ray: No acute cardiopulmonary abnormality.   CAT scan of head: Right temporal partial calvarial low attenuation, atrophy with chronic small vessel ischemic disease. No acute intracranial abnormality.   CK 95, CK-MB 1.2, troponin 0.06. CBC normal. Glucose 114, BUN 15, creatinine 0.82, sodium 142, potassium 3.3, chloride 105, bicarbonate 30. EKG shows sinus rhythm with occasional PVCs, left axis deviation, prolonged QT.   IMPRESSION:  1. Elevated troponin.  2. Hypokalemia.  3. Hypertension, uncontrolled.  4. Hyperlipidemia.  5. History of cerebrovascular accident.   PLAN OF TREATMENT:  1. The patient will be placed for observation. Will monitor troponin x2 and increase aspirin to 325 mg p.o. daily. Continue Lipitor, Norvasc. Will get a cardiology consult from Dr. Nehemiah Massed.  2. GI and deep vein thrombosis prophylaxis.  3. Give Klor-Con and follow up BMP and magnesium level. Also will get a urinalysis.   Discussed patient's situation and the plan of treatment with and  the patient and patient's daughter.   TIME SPENT: About 55 minutes.   ____________________________ Demetrios Loll, MD qc:cms D: 09/20/2011 17:59:52 ET T: 09/21/2011 05:35:29 ET JOB#: [id number]  cc: Demetrios Loll, MD, <Dictator> Demetrios Loll MD ELECTRONICALLY SIGNED 09/21/2011 13:44

## 2014-08-25 NOTE — Discharge Summary (Signed)
PATIENT NAME:  Taylor Watson, Taylor Watson MR#:  [id number] DATE OF BIRTH:  1918-05-09  DATE OF ADMISSION:  09/20/2011 DATE OF DISCHARGE:  09/21/2011  DIAGNOSES:  1. Weakness, anxiety possibly due to current discontinuation of Lexapro. 2. Elevated troponin, unclear etiology.  3. Hypokalemia. 4. Hypertension. 5. Hyperlipidemia. 6. History of cerebrovascular accident.   DISPOSITION: The patient is being discharged home.   FOLLOW-UP: Follow-up with Dr. Ubaldo Glassing and primary care physician, Dr. [name], in 1 to 2 weeks after discharge.   DIET: Low sodium.   ACTIVITY: As tolerated.   DISCHARGE MEDICATIONS:  1. Lipitor 40 mg daily.  2. Norvasc 10 mg daily.  3. Aspirin 81 mg daily.  4. Lexapro 10 mg 0.5 tablets once a day.  5. Omeprazole 20 mg daily.   CONSULTATION: Cardiology consultation with Dr. Ubaldo Glassing   LABORATORY, DIAGNOSTIC, AND RADIOLOGICAL DATA: CT of the head showed no acute abnormalities.   Chest x-ray showed no acute abnormalities.   Urinalysis showed no evidence of infection. CBC normal. Cardiac enzymes very minimally elevated from 0.06 to 0.07. Normal LFTs. Normal renal function. Potassium 3.3, supplemented. LDL 85, VLDL 24. Magnesium 1.7, supplemented.   HOSPITAL COURSE: The patient is a 81 year old female with past medical history of cerebrovascular accident, hypertension, depression and anxiety who presented with weakness, dizziness, and anxiety. The patient reported that she had stopped taking Lexapro on her own about a week ago and since then developed these symptoms. Currently she was feeling well and was ambulating without any difficulty. She had very minimally elevated troponins which were of unclear etiology. She denied any chest pain. Her EKG was normal sinus rhythm with PVCs and no acute ischemic changes. The patient did report that she drank a lot of caffeine and soda. The patient was evaluated by Dr. Ubaldo Glassing during the hospitalization. He planned no intervention. He advised  follow-up with him as an outpatient. The patient had mild hypokalemia which was supplemented. The rest of her medical problems remained stable. She is on statin therapy for her hyperlipidemia and her LDL is at goal at 85.   DISPOSITION: She is being discharged home in a stable condition.   TIME SPENT: 45 minutes.   ____________________________ Cherre Huger, MD sp:drc D: 09/21/2011 16:28:19 ET T: 09/22/2011 10:42:10 ET JOB#: [id number]  cc: Cherre Huger, MD, <Dictator> Javier Docker. Ubaldo Glassing, MD [name], MD Cherre Huger MD ELECTRONICALLY SIGNED 09/22/2011 15:42

## 2014-08-25 NOTE — Consult Note (Signed)
General Aspect the patient is a 81 year old female with history of hypertension, anxiety and history of anemia who was admitted with dizziness and shortness of breath.  She also complained of mild chest tightness.  In the emergency room her electrocardiogram reveals sinus rhythm with PVCs.  There were no ischemic changes.  It is of note the patient recently stopped her Lexapro.    Present Illness patient was admitted with sensation of irregular heartbeat, shortness of breath and mild chest tightness.  She had a trivial troponin elevation of 0.07.  Electrocardiogram reveals sinus rhythm with PVCs.  There were no ischemic changes.  Her symptoms have dramatically improved.  She had recently stopped her Lexapro 10 this is been restarted.  She denies syncope or presyncope.  She denies back or arm pain.  She is ambulating well and anxious to go home.   Physical Exam:   GEN no acute distress, thin    HEENT hearing intact to voice    NECK supple    RESP normal resp effort    CARD Regular rate and rhythm  Murmur    Murmur Systolic    Systolic Murmur Out flow    ABD denies tenderness  normal BS  no Abdominal Bruits    LYMPH negative neck    EXTR negative cyanosis/clubbing, negative edema    SKIN normal to palpation    NEURO cranial nerves intact, motor/sensory function intact    PSYCH A+O to time, place, person   Review of Systems:   Subjective/Chief Complaint palpitations and shortness of breath    General: Weakness    Skin: No Complaints    ENT: No Complaints    Eyes: No Complaints    Neck: No Complaints    Respiratory: Short of breath    Cardiovascular: Palpitations    Gastrointestinal: No Complaints    Genitourinary: No Complaints    Vascular: No Complaints    Musculoskeletal: No Complaints    Neurologic: No Complaints    Hematologic: No Complaints    Endocrine: No Complaints    Psychiatric: No Complaints    Review of Systems: All other systems were  reviewed and found to be negative    Medications/Allergies Reviewed Medications/Allergies reviewed     Gastric Reflux:    Anxiety:    Hypercholesterolemia:    Hypertension:   Home Medications: Medication Instructions Status  Lipitor 40 mg oral tablet 1 tab(s) orally once a day Active  Norvasc 10 mg oral tablet 1 tab(s) orally once a day Active  aspirin 81 mg oral tablet 1 tab(s) orally once a day Active  Lexapro 10 mg oral tablet 0.5 tab(s) orally once a day Active  omeprazole 20 mg oral delayed release capsule 1 cap(s) orally once a day Active   EKG:   EKG NSR    Interpretation ooccasional PVCs    No Known Allergies:     Impression 81 year old female history of mild aortic valve sclerosis without stenosis by previous echo, history of anxiety and hypertension who was admitted with palpitations and shortness of breath.  She recently been taken off of her Lexapro.  Symptoms began occurring after this.  She had trivial troponin elevation.  Her symptoms are nonexertional.  She is ambulating today without any symptoms or difficulty.  Her telemetry strips reveal sinus rhythm with PVCs.    Plan 1.  Continue with current medications including restarting her Lexapro 2.  Low-fat low-cholesterol low-sodium diet 3.  Ambulate and discharge today with outpatient followup as desired.  Electronic Signatures: Teodoro Spray (MD)  (Signed 21-May-13 11:14)  Authored: General Aspect/Present Illness, History and Physical Exam, Review of System, Past Medical History, Home Medications, EKG , Allergies, Impression/Plan   Last Updated: 21-May-13 11:14 by Teodoro Spray (MD)

## 2014-10-14 DIAGNOSIS — G540 Brachial plexus disorders: Secondary | ICD-10-CM | POA: Diagnosis not present

## 2014-11-06 ENCOUNTER — Other Ambulatory Visit: Payer: Self-pay | Admitting: Internal Medicine

## 2014-11-13 DIAGNOSIS — H4011X3 Primary open-angle glaucoma, severe stage: Secondary | ICD-10-CM | POA: Diagnosis not present

## 2014-12-05 ENCOUNTER — Ambulatory Visit (INDEPENDENT_AMBULATORY_CARE_PROVIDER_SITE_OTHER): Payer: Medicare Other | Admitting: Internal Medicine

## 2014-12-05 ENCOUNTER — Encounter: Payer: Self-pay | Admitting: Internal Medicine

## 2014-12-05 ENCOUNTER — Telehealth: Payer: Self-pay | Admitting: Internal Medicine

## 2014-12-05 VITALS — BP 128/64 | HR 78 | Temp 98.0°F | Ht 63.5 in | Wt 124.2 lb

## 2014-12-05 DIAGNOSIS — R634 Abnormal weight loss: Secondary | ICD-10-CM

## 2014-12-05 DIAGNOSIS — R42 Dizziness and giddiness: Secondary | ICD-10-CM | POA: Diagnosis not present

## 2014-12-05 DIAGNOSIS — E7800 Pure hypercholesterolemia, unspecified: Secondary | ICD-10-CM

## 2014-12-05 DIAGNOSIS — I10 Essential (primary) hypertension: Secondary | ICD-10-CM

## 2014-12-05 DIAGNOSIS — F419 Anxiety disorder, unspecified: Secondary | ICD-10-CM

## 2014-12-05 DIAGNOSIS — H5710 Ocular pain, unspecified eye: Secondary | ICD-10-CM

## 2014-12-05 DIAGNOSIS — K625 Hemorrhage of anus and rectum: Secondary | ICD-10-CM

## 2014-12-05 DIAGNOSIS — I639 Cerebral infarction, unspecified: Secondary | ICD-10-CM

## 2014-12-05 DIAGNOSIS — R221 Localized swelling, mass and lump, neck: Secondary | ICD-10-CM

## 2014-12-05 LAB — BASIC METABOLIC PANEL
BUN: 10 mg/dL (ref 6–23)
CHLORIDE: 101 meq/L (ref 96–112)
CO2: 32 mEq/L (ref 19–32)
Calcium: 9.3 mg/dL (ref 8.4–10.5)
Creatinine, Ser: 0.83 mg/dL (ref 0.40–1.20)
GFR: 82.34 mL/min (ref >60.00)
Glucose, Bld: 88 mg/dL (ref 70–99)
Potassium: 3.4 mEq/L — ABNORMAL LOW (ref 3.5–5.1)
SODIUM: 140 meq/L (ref 135–145)

## 2014-12-05 LAB — HEPATIC FUNCTION PANEL
ALBUMIN: 3.8 g/dL (ref 3.5–5.2)
ALK PHOS: 99 U/L (ref 39–117)
ALT: 5 U/L (ref 0–35)
AST: 11 U/L (ref 0–37)
BILIRUBIN DIRECT: 0.1 mg/dL (ref 0.0–0.3)
Total Bilirubin: 0.9 mg/dL (ref 0.2–1.2)
Total Protein: 6.9 g/dL (ref 6.0–8.3)

## 2014-12-05 LAB — CBC WITH DIFFERENTIAL/PLATELET
BASOS ABS: 0 10*3/uL (ref 0.0–0.1)
Basophils Relative: 0.1 % (ref 0.0–3.0)
EOS PCT: 7.1 % — AB (ref 0.0–5.0)
Eosinophils Absolute: 0.3 10*3/uL (ref 0.0–0.7)
HCT: 39 % (ref 36.0–46.0)
Hemoglobin: 12.3 g/dL (ref 12.0–15.0)
Lymphocytes Relative: 27.1 % (ref 12.0–46.0)
Lymphs Abs: 1.1 10*3/uL (ref 0.7–4.0)
MCHC: 31.6 g/dL (ref 30.0–36.0)
MCV: 79 fl (ref 78.0–100.0)
MONOS PCT: 8.7 % (ref 3.0–12.0)
Monocytes Absolute: 0.3 10*3/uL (ref 0.1–1.0)
Neutro Abs: 2.2 10*3/uL (ref 1.4–7.7)
Neutrophils Relative %: 57 % (ref 43.0–77.0)
PLATELETS: 254 10*3/uL (ref 150.0–400.0)
RBC: 4.93 Mil/uL (ref 3.87–5.11)
RDW: 16.9 % — ABNORMAL HIGH (ref 11.5–15.5)
WBC: 3.9 10*3/uL — AB (ref 4.0–10.5)

## 2014-12-05 MED ORDER — AMLODIPINE BESYLATE 2.5 MG PO TABS: 2.5000 mg | ORAL_TABLET | Freq: Every day | ORAL | Status: DC

## 2014-12-05 MED ORDER — ESCITALOPRAM OXALATE 10 MG PO TABS: 10.0000 mg | ORAL_TABLET | Freq: Every day | ORAL | Status: DC

## 2014-12-05 NOTE — Telephone Encounter (Signed)
Pharmacy called about pt medication Lexapro dosage amt. [facility]

## 2014-12-05 NOTE — Progress Notes (Signed)
Pre visit review using our clinic review tool, if applicable. No additional management support is needed unless otherwise documented below in the visit note. 

## 2014-12-05 NOTE — Patient Instructions (Addendum)
Stop amlodipine 5mg  per day.  Start 2.5mg  per day.    Increase lexapro to 1 tablet per day.

## 2014-12-05 NOTE — Telephone Encounter (Signed)
Spoke with pharmacist and Dr. Nicki Reaper to confirm that patient needs 1 tablet daily.

## 2014-12-05 NOTE — Progress Notes (Addendum)
Patient ID: Taylor Watson, female   DOB: 07/01/1918, 81 y.o.   MRN: [id number]   Subjective:    Patient ID: Taylor Watson, female    DOB: Jan 22, 1919, 81 y.o.   MRN: [id number]  HPI  Patient here for a scheduled follow up.  She is still losing weight.  We discussed this at length.  She is eating.  No nausea or vomiting.  No abdominal pain or cramping.  No acid reflux.  She does report some increased anxiety.  States no specific stress.  Will start thinking about things and cut shut her mind down.  Only taking 1/2 lexapro now.  States she feels this helps for a while after she takes the medication.  Describes an occasional feeling of not being able to get a good breath.  States this occurs when she is feeling anxious.  No chest pain.  No bowel change.     Past Medical History  Diagnosis Date  . Hypertension   . CVA (cerebral vascular accident)   . Anemia   . Hypercholesterolemia   . GERD (gastroesophageal reflux disease)   . Allergy   . Hx: UTI (urinary tract infection)   . History of blood transfusion   . History of kidney stones     Outpatient Encounter Prescriptions as of 12/05/2014  Medication Sig  . aspirin 81 MG tablet Take 81 mg by mouth daily.  . Brinzolamide-Brimonidine (SIMBRINZA) 1-0.2 % SUSP Apply 1 drop to eye 2 (two) times daily.  Marland Kitchen escitalopram (LEXAPRO) 10 MG tablet Take 1 tablet (10 mg total) by mouth daily. 1/2 tablet q day  . omeprazole (PRILOSEC) 20 MG capsule Take 1 capsule (20 mg total) by mouth daily.  . [DISCONTINUED] amLODipine (NORVASC) 5 MG tablet TAKE ONE TABLET BY MOUTH EVERY DAY STOP TAKING 10MG  AMLODIPINE  . [DISCONTINUED] escitalopram (LEXAPRO) 10 MG tablet Take 0.5 tablets (5 mg total) by mouth daily. 1/2 tablet q day  . amLODipine (NORVASC) 2.5 MG tablet Take 1 tablet (2.5 mg total) by mouth daily.   No facility-administered encounter medications on file as of 12/05/2014.    Review of Systems  Constitutional:       She is eating.  Continues to  lose weight.    HENT: Negative for congestion and sinus pressure.   Respiratory: Negative for cough and chest tightness.        Describes the feeling of not being able to get a good breath - intermittently.   Cardiovascular: Negative for chest pain, palpitations and leg swelling.  Gastrointestinal: Negative for nausea, vomiting, abdominal pain and diarrhea.  Genitourinary: Negative for dysuria and difficulty urinating.  Musculoskeletal: Negative for back pain and joint swelling.  Skin: Negative for color change and rash.  Neurological: Negative for dizziness and headaches.  Psychiatric/Behavioral: Negative for dysphoric mood and agitation.       Some increased anxiety as outlined.        Objective:    Physical Exam  Constitutional: No distress.  HENT:  Nose: Nose normal.  Mouth/Throat: Oropharynx is clear and moist.  Neck: Neck supple. No thyromegaly present.  Cardiovascular: Normal rate and regular rhythm.   Pulmonary/Chest: Breath sounds normal. No respiratory distress. She has no wheezes.  Abdominal: Soft. Bowel sounds are normal. There is no tenderness.  Musculoskeletal: She exhibits no edema or tenderness.  Lymphadenopathy:    She has no cervical adenopathy.  Skin: No rash noted. No erythema.  Psychiatric: She has a normal mood and affect. Her behavior is  normal.    BP 128/64 mmHg  Pulse 78  Temp(Src) 98 F (36.7 C) (Oral)  Ht 5' 3.5" (1.613 m)  Wt 124 lb 4 oz (56.359 kg)  BMI 21.66 kg/m2  SpO2 92% Wt Readings from Last 3 Encounters:  12/05/14 124 lb 4 oz (56.359 kg)  08/05/14 130 lb 6 oz (59.138 kg)  02/14/14 132 lb 8 oz (60.102 kg)     Lab Results  Component Value Date   WBC 3.9* 12/05/2014   HGB 12.3 12/05/2014   HCT 39.0 12/05/2014   PLT 254.0 12/05/2014   GLUCOSE 88 12/05/2014   CHOL 224* 11/06/2013   TRIG 97 11/06/2013   HDL 64 11/06/2013   LDLDIRECT 163.5 01/12/2013   LDLCALC 141 11/06/2013   ALT 5 12/05/2014   AST 11 12/05/2014   NA 140  12/05/2014   K 3.4* 12/05/2014   CL 101 12/05/2014   CREATININE 0.83 12/05/2014   BUN 10 12/05/2014   CO2 32 12/05/2014   TSH 2.26 08/05/2014   HGBA1C 5.7 10/04/2013       Assessment & Plan:   Problem List Items Addressed This Visit    Anxiety    Increased anxiety as outlined.  Discussed at length with her today.  She feels her symptoms are related to this.  Increase lexapro to 10mg  q day.  Follow closely.  Get her back in soon to reassess. Desires no further w/up at this time for the breathing.  Declines cxr, etc.        Relevant Medications   escitalopram (LEXAPRO) 10 MG tablet   CVA (cerebral vascular accident)    On aspirin.  No reoccurring symptoms.  Follow.        Relevant Medications   amLODipine (NORVASC) 2.5 MG tablet   Essential hypertension, benign    Blood pressure under good control.  Continue same medication regimen.  Follow pressures.  Follow metabolic panel.        Relevant Medications   amLODipine (NORVASC) 2.5 MG tablet   Other Relevant Orders   Basic metabolic panel (Completed)   Eye discomfort    Has a feeling of fullness associated with the eye.  Refer to her eye MD.  She sees Dr Joya San.       Relevant Orders   Ambulatory referral to Optometry   Hypercholesterolemia    Follow lipid panel.       Relevant Medications   amLODipine (NORVASC) 2.5 MG tablet   Other Relevant Orders   Hepatic function panel (Completed)   Light headedness    Reports some occasional light headedness.  Will decrease amlodipine to 2.5mg  q day.  Follow pressures.  Follow metabolic panel.       Loss of weight - Primary    Discussed with her today.  Discussed adding nutritional supplements.  Get her back in soon to reassess.  Declines further w/up.       Relevant Orders   CBC with Differential/Platelet (Completed)   Neck nodule    Saw ENT.  Brachial plexus lesion.  They discussed neurosurgery referral.  She declines.        Rectal bleeding    No further bleeding.   She declines any further w/up or GI evaluation.        I spent 25 minutes with the patient and more than 50% of the time was spent in consultation regarding the above.     [name], MD

## 2014-12-05 NOTE — Telephone Encounter (Signed)
Pharmacy called about pt medication Lexapro dosage amt. [facility]ist [facility]

## 2014-12-06 ENCOUNTER — Other Ambulatory Visit: Payer: Self-pay | Admitting: Internal Medicine

## 2014-12-06 ENCOUNTER — Encounter: Payer: Self-pay | Admitting: *Deleted

## 2014-12-06 DIAGNOSIS — E876 Hypokalemia: Secondary | ICD-10-CM

## 2014-12-06 DIAGNOSIS — D72819 Decreased white blood cell count, unspecified: Secondary | ICD-10-CM

## 2014-12-06 NOTE — Progress Notes (Signed)
Order placed for f/u labs.  

## 2014-12-07 ENCOUNTER — Encounter: Payer: Self-pay | Admitting: Internal Medicine

## 2014-12-07 DIAGNOSIS — H5710 Ocular pain, unspecified eye: Secondary | ICD-10-CM | POA: Insufficient documentation

## 2014-12-07 NOTE — Assessment & Plan Note (Signed)
Has a feeling of fullness associated with the eye.  Refer to her eye MD.  She sees Dr Joya San.

## 2014-12-07 NOTE — Addendum Note (Signed)
Addended by: Alisa Graff on: 12/07/2014 06:11 PM   Modules accepted: Orders

## 2014-12-07 NOTE — Assessment & Plan Note (Signed)
On aspirin.  No reoccurring symptoms.  Follow.

## 2014-12-07 NOTE — Assessment & Plan Note (Signed)
No further bleeding.  She declines any further w/up or GI evaluation.

## 2014-12-07 NOTE — Assessment & Plan Note (Signed)
Blood pressure under good control.  Continue same medication regimen.  Follow pressures.  Follow metabolic panel.   

## 2014-12-07 NOTE — Assessment & Plan Note (Signed)
Increased anxiety as outlined.  Discussed at length with her today.  She feels her symptoms are related to this.  Increase lexapro to 10mg  q day.  Follow closely.  Get her back in soon to reassess. Desires no further w/up at this time for the breathing.  Declines cxr, etc.

## 2014-12-07 NOTE — Assessment & Plan Note (Signed)
Reports some occasional light headedness.  Will decrease amlodipine to 2.5mg  q day.  Follow pressures.  Follow metabolic panel.

## 2014-12-07 NOTE — Assessment & Plan Note (Signed)
Discussed with her today.  Discussed adding nutritional supplements.  Get her back in soon to reassess.  Declines further w/up.

## 2014-12-07 NOTE — Assessment & Plan Note (Signed)
Saw ENT.  Brachial plexus lesion.  They discussed neurosurgery referral.  She declines.

## 2014-12-07 NOTE — Addendum Note (Signed)
Addended by: Alisa Graff on: 12/07/2014 06:13 PM   Modules accepted: Orders

## 2014-12-07 NOTE — Assessment & Plan Note (Signed)
Follow lipid panel.   

## 2014-12-10 DIAGNOSIS — H4011X3 Primary open-angle glaucoma, severe stage: Secondary | ICD-10-CM | POA: Diagnosis not present

## 2014-12-23 ENCOUNTER — Other Ambulatory Visit (INDEPENDENT_AMBULATORY_CARE_PROVIDER_SITE_OTHER): Payer: Medicare Other

## 2014-12-23 DIAGNOSIS — D72819 Decreased white blood cell count, unspecified: Secondary | ICD-10-CM | POA: Diagnosis not present

## 2014-12-23 DIAGNOSIS — E876 Hypokalemia: Secondary | ICD-10-CM | POA: Diagnosis not present

## 2014-12-23 LAB — CBC WITH DIFFERENTIAL/PLATELET
BASOS ABS: 0.1 10*3/uL (ref 0.0–0.1)
Basophils Relative: 2.2 % (ref 0.0–3.0)
EOS ABS: 0.5 10*3/uL (ref 0.0–0.7)
Eosinophils Relative: 8.6 % — ABNORMAL HIGH (ref 0.0–5.0)
HEMATOCRIT: 39.8 % (ref 36.0–46.0)
Hemoglobin: 12.7 g/dL (ref 12.0–15.0)
LYMPHS ABS: 1.7 10*3/uL (ref 0.7–4.0)
LYMPHS PCT: 30.7 % (ref 12.0–46.0)
MCHC: 31.8 g/dL (ref 30.0–36.0)
MCV: 79.2 fl (ref 78.0–100.0)
Monocytes Absolute: 0.5 10*3/uL (ref 0.1–1.0)
Monocytes Relative: 8.7 % (ref 3.0–12.0)
Neutro Abs: 2.7 10*3/uL (ref 1.4–7.7)
Neutrophils Relative %: 49.8 % (ref 43.0–77.0)
Platelets: 307 10*3/uL (ref 150.0–400.0)
RBC: 5.03 Mil/uL (ref 3.87–5.11)
RDW: 17.4 % — ABNORMAL HIGH (ref 11.5–15.5)
WBC: 5.5 10*3/uL (ref 4.0–10.5)

## 2014-12-23 LAB — POTASSIUM: POTASSIUM: 4 meq/L (ref 3.5–5.1)

## 2014-12-24 ENCOUNTER — Encounter: Payer: Self-pay | Admitting: *Deleted

## 2015-02-06 ENCOUNTER — Other Ambulatory Visit: Payer: Self-pay | Admitting: Internal Medicine

## 2015-02-07 ENCOUNTER — Ambulatory Visit (INDEPENDENT_AMBULATORY_CARE_PROVIDER_SITE_OTHER): Payer: Medicare Other | Admitting: Internal Medicine

## 2015-02-07 ENCOUNTER — Encounter: Payer: Self-pay | Admitting: Internal Medicine

## 2015-02-07 VITALS — BP 120/70 | HR 87 | Temp 98.4°F | Resp 16 | Ht 63.5 in | Wt 124.0 lb

## 2015-02-07 DIAGNOSIS — R351 Nocturia: Secondary | ICD-10-CM | POA: Diagnosis not present

## 2015-02-07 DIAGNOSIS — R2681 Unsteadiness on feet: Secondary | ICD-10-CM

## 2015-02-07 DIAGNOSIS — I10 Essential (primary) hypertension: Secondary | ICD-10-CM | POA: Diagnosis not present

## 2015-02-07 DIAGNOSIS — R221 Localized swelling, mass and lump, neck: Secondary | ICD-10-CM

## 2015-02-07 DIAGNOSIS — F419 Anxiety disorder, unspecified: Secondary | ICD-10-CM | POA: Diagnosis not present

## 2015-02-07 DIAGNOSIS — R634 Abnormal weight loss: Secondary | ICD-10-CM

## 2015-02-07 DIAGNOSIS — E7800 Pure hypercholesterolemia, unspecified: Secondary | ICD-10-CM

## 2015-02-07 DIAGNOSIS — I639 Cerebral infarction, unspecified: Secondary | ICD-10-CM | POA: Diagnosis not present

## 2015-02-07 NOTE — Progress Notes (Signed)
Pre-visit discussion using our clinic review tool. No additional management support is needed unless otherwise documented below in the visit note.  

## 2015-02-07 NOTE — Progress Notes (Signed)
Patient ID: Taylor Watson, female   DOB: [birth year], 81 y.o.   MRN: [id number]   Subjective:    Patient ID: Taylor Watson, female    DOB: [birth year], 81 y.o.   MRN: [id number]  HPI  Patient with past history of CVA, hypertension, weight loss and hypercholesterolemia.  She comes in today for follow up of these issues.  Her weight is stable.  States her appetite is better.  No cardiac symptoms with increased activity or exertion.  Feels her breathing is stable.  Denies any sob.  No abdominal pain or cramping.  Bowels stable.  Taking lexapro 10mg  qd now.  Feels this is helping.  She reports some unsteadiness.  No falls.  We discussed cane and walker.  She has a walker at home.  Request rx for four pronged cane.  Some nocturia.  No dysuria.     Past Medical History  Diagnosis Date  . Hypertension   . CVA (cerebral vascular accident) ([place])   . Anemia   . Hypercholesterolemia   . GERD (gastroesophageal reflux disease)   . Allergy   . Hx: UTI (urinary tract infection)   . History of blood transfusion   . History of kidney stones    Past Surgical History  Procedure Laterality Date  . Cholecystectomy  1984  . Neck lesion biopsy  2015    Followed by Dr. Richardson Landry   Family History  Problem Relation Age of Onset  . Cancer Mother     unknown type  . Hypertension Mother   . Cancer Father     unknown type  . Breast cancer Daughter   . Colon cancer      grandfather   Social History   Social History  . Marital Status: Widowed    Spouse Name: N/A  . Number of Children: 5  . Years of Education: N/A   Social History Main Topics  . Smoking status: Former Research scientist (life sciences)  . Smokeless tobacco: Former Systems developer    Types: Chew  . Alcohol Use: No  . Drug Use: No  . Sexual Activity: Not Asked   Other Topics Concern  . None   Social History Narrative    Outpatient Encounter Prescriptions as of 02/07/2015  Medication Sig  . amLODipine (NORVASC) 2.5 MG tablet Take 1 tablet (2.5 mg total) by  mouth daily.  Marland Kitchen aspirin 81 MG tablet Take 81 mg by mouth daily.  . Brinzolamide-Brimonidine (SIMBRINZA) 1-0.2 % SUSP Apply 1 drop to eye 2 (two) times daily.  Marland Kitchen escitalopram (LEXAPRO) 10 MG tablet Take 1 tablet (10 mg total) by mouth daily. 1/2 tablet q day  . omeprazole (PRILOSEC) 20 MG capsule TAKE ONE CAPSULE BY MOUTH EVERY DAY   No facility-administered encounter medications on file as of 02/07/2015.    Review of Systems  Constitutional: Negative for appetite change and unexpected weight change.  HENT: Negative for congestion and sinus pressure.   Eyes: Negative for pain and visual disturbance.  Respiratory: Negative for cough, chest tightness and shortness of breath.   Cardiovascular: Negative for chest pain, palpitations and leg swelling.  Gastrointestinal: Negative for nausea, vomiting, abdominal pain and diarrhea.  Genitourinary: Negative for dysuria and difficulty urinating.  Musculoskeletal: Negative for back pain and joint swelling.  Skin: Negative for color change and rash.  Neurological: Negative for dizziness, light-headedness and headaches.  Psychiatric/Behavioral: Negative for dysphoric mood and agitation.       Objective:    Physical Exam  Constitutional: She appears well-developed and well-nourished.  No distress.  HENT:  Nose: Nose normal.  Mouth/Throat: Oropharynx is clear and moist.  Eyes: Conjunctivae are normal. Right eye exhibits no discharge. Left eye exhibits no discharge.  Neck: Neck supple. No thyromegaly present.  Cardiovascular: Normal rate and regular rhythm.   Pulmonary/Chest: Breath sounds normal. No respiratory distress. She has no wheezes.  Abdominal: Soft. Bowel sounds are normal. There is no tenderness.  Musculoskeletal: She exhibits no edema or tenderness.  Lymphadenopathy:    She has no cervical adenopathy.  Skin: No rash noted. No erythema.  Psychiatric: She has a normal mood and affect. Her behavior is normal.    BP 120/70 mmHg  Pulse  87  Temp(Src) 98.4 F (36.9 C) (Oral)  Resp 16  Ht 5' 3.5" (1.613 m)  Wt 124 lb (56.246 kg)  BMI 21.62 kg/m2  SpO2 92% Wt Readings from Last 3 Encounters:  02/07/15 124 lb (56.246 kg)  12/05/14 124 lb 4 oz (56.359 kg)  08/05/14 130 lb 6 oz (59.138 kg)     Lab Results  Component Value Date   WBC 5.5 12/23/2014   HGB 12.7 12/23/2014   HCT 39.8 12/23/2014   PLT 307.0 12/23/2014   GLUCOSE 88 12/05/2014   CHOL 224* 11/06/2013   TRIG 97 11/06/2013   HDL 64 11/06/2013   LDLDIRECT 163.5 01/12/2013   LDLCALC 141 11/06/2013   ALT 5 12/05/2014   AST 11 12/05/2014   NA 140 12/05/2014   K 4.0 12/23/2014   CL 101 12/05/2014   CREATININE 0.83 12/05/2014   BUN 10 12/05/2014   CO2 32 12/05/2014   TSH 2.26 08/05/2014   HGBA1C 5.7 10/04/2013       Assessment & Plan:   Problem List Items Addressed This Visit    Anxiety    Doing better on lexapro 10mg  q day now.  Follow.        CVA (cerebral vascular accident) ([place])    On aspirin.  No reoccurring symptoms.  Follow.       Essential hypertension, benign    Blood pressure under good control.  Continue same medication regimen.  Follow pressures.  Follow metabolic panel.        Hypercholesterolemia    Low cholesterol diet and exercise.  Follow lipid panel.        Loss of weight    Weight stable.  Eating better.  Follow.        Neck nodule    Saw ENT.  Brachial plexus lesion.  They discussed neurosurgery referral.  She declined.  Follow.       Unsteady gait    Some unsteadiness as outlined.  rx given for four pronged cane.  Follow.        Other Visit Diagnoses    Nocturia    -  Primary    Relevant Orders    CULTURE, URINE COMPREHENSIVE    Urinalysis, Routine w reflex microscopic (not at [facility]) (Completed)        [name], MD

## 2015-02-08 ENCOUNTER — Encounter: Payer: Self-pay | Admitting: Internal Medicine

## 2015-02-08 DIAGNOSIS — R2681 Unsteadiness on feet: Secondary | ICD-10-CM | POA: Insufficient documentation

## 2015-02-08 LAB — URINALYSIS, MICROSCOPIC ONLY
Bacteria, UA: NONE SEEN [HPF]
CASTS: NONE SEEN [LPF]
CRYSTALS: NONE SEEN [HPF]
RBC / HPF: NONE SEEN RBC/HPF (ref <=2)
YEAST: NONE SEEN [HPF]

## 2015-02-08 LAB — URINALYSIS, ROUTINE W REFLEX MICROSCOPIC
Bilirubin Urine: NEGATIVE
GLUCOSE, UA: NEGATIVE
HGB URINE DIPSTICK: NEGATIVE
Ketones, ur: NEGATIVE
Nitrite: NEGATIVE
PH: 6.5 (ref 5.0–8.0)
Protein, ur: NEGATIVE
SPECIFIC GRAVITY, URINE: 1.018 (ref 1.001–1.035)

## 2015-02-08 NOTE — Assessment & Plan Note (Signed)
Doing better on lexapro 10mg  q day now.  Follow.

## 2015-02-08 NOTE — Assessment & Plan Note (Signed)
On aspirin.  No reoccurring symptoms.  Follow.

## 2015-02-08 NOTE — Assessment & Plan Note (Signed)
Low cholesterol diet and exercise.  Follow lipid panel.   

## 2015-02-08 NOTE — Assessment & Plan Note (Signed)
Some unsteadiness as outlined.  rx given for four pronged cane.  Follow.

## 2015-02-08 NOTE — Assessment & Plan Note (Signed)
Saw ENT.  Brachial plexus lesion.  They discussed neurosurgery referral.  She declined.  Follow.

## 2015-02-08 NOTE — Assessment & Plan Note (Signed)
Weight stable.  Eating better.  Follow.

## 2015-02-08 NOTE — Assessment & Plan Note (Signed)
Blood pressure under good control.  Continue same medication regimen.  Follow pressures.  Follow metabolic panel.   

## 2015-02-10 LAB — CULTURE, URINE COMPREHENSIVE

## 2015-02-18 DIAGNOSIS — H401123 Primary open-angle glaucoma, left eye, severe stage: Secondary | ICD-10-CM | POA: Diagnosis not present

## 2015-02-18 DIAGNOSIS — H401112 Primary open-angle glaucoma, right eye, moderate stage: Secondary | ICD-10-CM | POA: Diagnosis not present

## 2015-03-18 ENCOUNTER — Other Ambulatory Visit: Payer: Self-pay | Admitting: Internal Medicine

## 2015-04-16 DIAGNOSIS — G540 Brachial plexus disorders: Secondary | ICD-10-CM | POA: Diagnosis not present

## 2015-05-12 ENCOUNTER — Ambulatory Visit: Payer: Medicare Other | Admitting: Internal Medicine

## 2015-05-28 ENCOUNTER — Encounter: Payer: Self-pay | Admitting: Internal Medicine

## 2015-05-28 ENCOUNTER — Ambulatory Visit (INDEPENDENT_AMBULATORY_CARE_PROVIDER_SITE_OTHER): Payer: Medicare Other | Admitting: Internal Medicine

## 2015-05-28 VITALS — BP 120/80 | HR 60 | Temp 97.9°F | Resp 17 | Ht 63.5 in | Wt 125.2 lb

## 2015-05-28 DIAGNOSIS — F419 Anxiety disorder, unspecified: Secondary | ICD-10-CM

## 2015-05-28 DIAGNOSIS — F329 Major depressive disorder, single episode, unspecified: Secondary | ICD-10-CM

## 2015-05-28 DIAGNOSIS — K219 Gastro-esophageal reflux disease without esophagitis: Secondary | ICD-10-CM | POA: Diagnosis not present

## 2015-05-28 DIAGNOSIS — I10 Essential (primary) hypertension: Secondary | ICD-10-CM | POA: Diagnosis not present

## 2015-05-28 DIAGNOSIS — E7800 Pure hypercholesterolemia, unspecified: Secondary | ICD-10-CM | POA: Diagnosis not present

## 2015-05-28 DIAGNOSIS — F32A Depression, unspecified: Secondary | ICD-10-CM

## 2015-05-28 DIAGNOSIS — R634 Abnormal weight loss: Secondary | ICD-10-CM

## 2015-05-28 DIAGNOSIS — R221 Localized swelling, mass and lump, neck: Secondary | ICD-10-CM

## 2015-05-28 LAB — LIPID PANEL
CHOL/HDL RATIO: 4
Cholesterol: 254 mg/dL — ABNORMAL HIGH (ref 0–200)
HDL: 58.4 mg/dL (ref >39.00)
LDL CALC: 176 mg/dL — AB (ref 0–99)
NonHDL: 195.61
Triglycerides: 100 mg/dL (ref 0.0–149.0)
VLDL: 20 mg/dL (ref 0.0–40.0)

## 2015-05-28 LAB — BASIC METABOLIC PANEL
BUN: 8 mg/dL (ref 6–23)
CALCIUM: 9.4 mg/dL (ref 8.4–10.5)
CO2: 32 mEq/L (ref 19–32)
Chloride: 101 mEq/L (ref 96–112)
Creatinine, Ser: 0.73 mg/dL (ref 0.40–1.20)
GFR: 95.39 mL/min (ref >60.00)
Glucose, Bld: 87 mg/dL (ref 70–99)
Potassium: 3.7 mEq/L (ref 3.5–5.1)
SODIUM: 140 meq/L (ref 135–145)

## 2015-05-28 LAB — HEPATIC FUNCTION PANEL
ALK PHOS: 105 U/L (ref 39–117)
ALT: 4 U/L (ref 0–35)
AST: 10 U/L (ref 0–37)
Albumin: 3.9 g/dL (ref 3.5–5.2)
BILIRUBIN DIRECT: 0.1 mg/dL (ref 0.0–0.3)
BILIRUBIN TOTAL: 0.9 mg/dL (ref 0.2–1.2)
Total Protein: 7 g/dL (ref 6.0–8.3)

## 2015-05-28 LAB — TSH: TSH: 2.15 u[IU]/mL (ref 0.35–4.50)

## 2015-05-28 NOTE — Progress Notes (Signed)
Pre-visit discussion using our clinic review tool. No additional management support is needed unless otherwise documented below in the visit note.  

## 2015-05-28 NOTE — Progress Notes (Signed)
Patient ID: KHALISE BIZZLE, female   DOB: [birth year], 82 y.o.   MRN: [id number]   Subjective:    Patient ID: Taylor Watson, female    DOB: 1918-10-27, 82 y.o.   MRN: [id number]  HPI  Patient with past history of hypercholesterolemia, GERD and hypertension.  She comes in today to follow up on these issues.  She states she has been doing relatively well.  Able to get around her house without difficulty.  No chest pain.  Breathing stable.  No acid reflux reported. No abdominal pain or cramping.  Bowels stable.  No blood.    Past Medical History  Diagnosis Date  . Hypertension   . CVA (cerebral vascular accident) ([place])   . Anemia   . Hypercholesterolemia   . GERD (gastroesophageal reflux disease)   . Allergy   . Hx: UTI (urinary tract infection)   . History of blood transfusion   . History of kidney stones   . Depression    Past Surgical History  Procedure Laterality Date  . Cholecystectomy  1984  . Neck lesion biopsy  2015    Followed by Dr. Richardson Landry   Family History  Problem Relation Age of Onset  . Cancer Mother     unknown type  . Hypertension Mother   . Cancer Father     unknown type  . Breast cancer Daughter   . Colon cancer      grandfather   Social History   Social History  . Marital Status: Widowed    Spouse Name: N/A  . Number of Children: 5  . Years of Education: N/A   Social History Main Topics  . Smoking status: Former Research scientist (life sciences)  . Smokeless tobacco: Former Systems developer    Types: Chew  . Alcohol Use: No  . Drug Use: No  . Sexual Activity: Not Currently   Other Topics Concern  . None   Social History Narrative    No facility-administered encounter medications on file as of 05/28/2015.   Outpatient Encounter Prescriptions as of 05/28/2015  Medication Sig  . escitalopram (LEXAPRO) 10 MG tablet TAKE ONE TABLET BY MOUTH EVERY DAY  . omeprazole (PRILOSEC) 20 MG capsule TAKE ONE CAPSULE BY MOUTH EVERY DAY  . [DISCONTINUED] amLODipine (NORVASC) 2.5 MG  tablet TAKE ONE TABLET BY MOUTH EVERY DAY  . [DISCONTINUED] aspirin 81 MG tablet Take 81 mg by mouth daily.  . [DISCONTINUED] Brinzolamide-Brimonidine (SIMBRINZA) 1-0.2 % SUSP Apply 1 drop to eye 2 (two) times daily.    Review of Systems  Constitutional: Negative for appetite change and unexpected weight change.  HENT: Negative for congestion and sinus pressure.   Respiratory: Negative for cough, chest tightness and shortness of breath.   Cardiovascular: Negative for chest pain, palpitations and leg swelling.  Gastrointestinal: Negative for nausea, vomiting, abdominal pain and diarrhea.  Genitourinary: Negative for dysuria and difficulty urinating.  Musculoskeletal: Negative for back pain and joint swelling.  Skin: Negative for color change and rash.  Neurological: Negative for dizziness, light-headedness and headaches.  Psychiatric/Behavioral: Negative for dysphoric mood and agitation.       On a whole lexapro now.  Feel helping.         Objective:    Physical Exam  Constitutional: She appears well-developed and well-nourished. No distress.  HENT:  Nose: Nose normal.  Mouth/Throat: Oropharynx is clear and moist.  Neck: Neck supple. No thyromegaly present.  Cardiovascular: Normal rate and regular rhythm.   Pulmonary/Chest: Breath sounds normal. No respiratory distress. She  has no wheezes.  Abdominal: Soft. Bowel sounds are normal. There is no tenderness.  Musculoskeletal: She exhibits no edema or tenderness.  Lymphadenopathy:    She has no cervical adenopathy.  Skin: No rash noted. No erythema.  Psychiatric: She has a normal mood and affect. Her behavior is normal.    BP 120/80 mmHg  Pulse 60  Temp(Src) 97.9 F (36.6 C) (Oral)  Resp 17  Ht 5' 3.5" (1.613 m)  Wt 125 lb 4 oz (56.813 kg)  BMI 21.84 kg/m2  SpO2 94% Wt Readings from Last 3 Encounters:  05/31/15 126 lb 1.7 oz (57.2 kg)  05/29/15 121 lb 6.4 oz (55.067 kg)  05/28/15 125 lb 4 oz (56.813 kg)     Lab  Results  Component Value Date   WBC 8.4 06/02/2015   HGB 8.6* 06/02/2015   HCT 25.7* 06/02/2015   PLT 115* 06/02/2015   GLUCOSE 92 06/01/2015   CHOL 254* 05/28/2015   TRIG 100.0 05/28/2015   HDL 58.40 05/28/2015   LDLDIRECT 163.5 01/12/2013   LDLCALC 176* 05/28/2015   ALT <5* 05/29/2015   AST 14* 05/29/2015   NA 137 06/01/2015   K 3.6 06/01/2015   CL 111 06/01/2015   CREATININE 0.84 06/01/2015   BUN 13 06/01/2015   CO2 21* 06/01/2015   TSH 2.15 05/28/2015   INR 1.38 06/01/2015   HGBA1C 5.7 10/04/2013       Assessment & Plan:   Problem List Items Addressed This Visit    Anxiety    Doing better on one whole lexapro.  Follow.        Depression    On lexapro.  Stable.  Doing better.  Follow.        Essential hypertension, benign - Primary    Blood pressure under good control.  Continue same medication regimen.  Follow pressures.  Follow metabolic panel.        Relevant Orders   TSH (Completed)   Basic metabolic panel (Completed)   GERD (gastroesophageal reflux disease)    On protonix.  No upper symptoms.        Hypercholesterolemia    Low cholesterol diet and exercise.  Follow lipid panel.        Relevant Orders   Hepatic function panel (Completed)   Lipid panel (Completed)   Loss of weight    Weight stable.  Eating better.  Follow.        Neck nodule    Saw ENT.  Brachial plexus lesion.  They discussed neurosurgery referral.  She declined.  Follow.            [name], MD

## 2015-05-29 ENCOUNTER — Encounter: Payer: Self-pay | Admitting: *Deleted

## 2015-05-29 ENCOUNTER — Inpatient Hospital Stay
Admission: EM | Admit: 2015-05-29 | Discharge: 2015-05-31 | DRG: 378 | Disposition: A | Discharge status: Other Acute Inpatient Hospital | Payer: Medicare Other | Attending: Specialist | Admitting: Specialist

## 2015-05-29 DIAGNOSIS — K922 Gastrointestinal hemorrhage, unspecified: Secondary | ICD-10-CM | POA: Diagnosis present

## 2015-05-29 DIAGNOSIS — K219 Gastro-esophageal reflux disease without esophagitis: Secondary | ICD-10-CM | POA: Diagnosis not present

## 2015-05-29 DIAGNOSIS — E46 Unspecified protein-calorie malnutrition: Secondary | ICD-10-CM | POA: Diagnosis present

## 2015-05-29 DIAGNOSIS — K37 Unspecified appendicitis: Secondary | ICD-10-CM | POA: Diagnosis present

## 2015-05-29 DIAGNOSIS — E7800 Pure hypercholesterolemia, unspecified: Secondary | ICD-10-CM | POA: Diagnosis not present

## 2015-05-29 DIAGNOSIS — K625 Hemorrhage of anus and rectum: Secondary | ICD-10-CM | POA: Diagnosis not present

## 2015-05-29 DIAGNOSIS — Z7982 Long term (current) use of aspirin: Secondary | ICD-10-CM | POA: Diagnosis not present

## 2015-05-29 DIAGNOSIS — Z8673 Personal history of transient ischemic attack (TIA), and cerebral infarction without residual deficits: Secondary | ICD-10-CM

## 2015-05-29 DIAGNOSIS — Z79899 Other long term (current) drug therapy: Secondary | ICD-10-CM

## 2015-05-29 DIAGNOSIS — R6510 Systemic inflammatory response syndrome (SIRS) of non-infectious origin without acute organ dysfunction: Secondary | ICD-10-CM | POA: Diagnosis present

## 2015-05-29 DIAGNOSIS — Z888 Allergy status to other drugs, medicaments and biological substances status: Secondary | ICD-10-CM | POA: Diagnosis not present

## 2015-05-29 DIAGNOSIS — F419 Anxiety disorder, unspecified: Secondary | ICD-10-CM | POA: Diagnosis present

## 2015-05-29 DIAGNOSIS — F329 Major depressive disorder, single episode, unspecified: Secondary | ICD-10-CM | POA: Diagnosis present

## 2015-05-29 DIAGNOSIS — I10 Essential (primary) hypertension: Secondary | ICD-10-CM | POA: Diagnosis present

## 2015-05-29 DIAGNOSIS — Z515 Encounter for palliative care: Secondary | ICD-10-CM | POA: Diagnosis present

## 2015-05-29 DIAGNOSIS — K921 Melena: Secondary | ICD-10-CM | POA: Diagnosis not present

## 2015-05-29 DIAGNOSIS — R933 Abnormal findings on diagnostic imaging of other parts of digestive tract: Secondary | ICD-10-CM | POA: Diagnosis not present

## 2015-05-29 DIAGNOSIS — D62 Acute posthemorrhagic anemia: Secondary | ICD-10-CM | POA: Diagnosis present

## 2015-05-29 DIAGNOSIS — I959 Hypotension, unspecified: Secondary | ICD-10-CM | POA: Diagnosis present

## 2015-05-29 DIAGNOSIS — E118 Type 2 diabetes mellitus with unspecified complications: Secondary | ICD-10-CM | POA: Diagnosis present

## 2015-05-29 DIAGNOSIS — Z87891 Personal history of nicotine dependence: Secondary | ICD-10-CM

## 2015-05-29 DIAGNOSIS — Z66 Do not resuscitate: Secondary | ICD-10-CM | POA: Diagnosis present

## 2015-05-29 DIAGNOSIS — E785 Hyperlipidemia, unspecified: Secondary | ICD-10-CM | POA: Diagnosis not present

## 2015-05-29 DIAGNOSIS — D500 Iron deficiency anemia secondary to blood loss (chronic): Secondary | ICD-10-CM | POA: Diagnosis present

## 2015-05-29 DIAGNOSIS — D638 Anemia in other chronic diseases classified elsewhere: Secondary | ICD-10-CM | POA: Diagnosis present

## 2015-05-29 DIAGNOSIS — D649 Anemia, unspecified: Secondary | ICD-10-CM

## 2015-05-29 DIAGNOSIS — K5790 Diverticulosis of intestine, part unspecified, without perforation or abscess without bleeding: Secondary | ICD-10-CM | POA: Diagnosis present

## 2015-05-29 LAB — COMPREHENSIVE METABOLIC PANEL
ALT: <5 U/L — ABNORMAL LOW (ref 14–54)
AST: 14 U/L — AB (ref 15–41)
Albumin: 3.6 g/dL (ref 3.5–5.0)
Alkaline Phosphatase: 93 U/L (ref 38–126)
Anion gap: 5 (ref 5–15)
BUN: 18 mg/dL (ref 6–20)
CHLORIDE: 105 mmol/L (ref 101–111)
CO2: 29 mmol/L (ref 22–32)
Calcium: 9 mg/dL (ref 8.9–10.3)
Creatinine, Ser: 0.81 mg/dL (ref 0.44–1.00)
Glucose, Bld: 108 mg/dL — ABNORMAL HIGH (ref 65–99)
POTASSIUM: 4.1 mmol/L (ref 3.5–5.1)
Sodium: 139 mmol/L (ref 135–145)
Total Bilirubin: 0.7 mg/dL (ref 0.3–1.2)
Total Protein: 6.9 g/dL (ref 6.5–8.1)

## 2015-05-29 LAB — CBC
HEMATOCRIT: 33.9 % — AB (ref 35.0–47.0)
HEMOGLOBIN: 10.8 g/dL — AB (ref 12.0–16.0)
MCH: 25.9 pg — ABNORMAL LOW (ref 26.0–34.0)
MCHC: 31.8 g/dL — ABNORMAL LOW (ref 32.0–36.0)
MCV: 81.4 fL (ref 80.0–100.0)
Platelets: 271 10*3/uL (ref 150–440)
RBC: 4.16 MIL/uL (ref 3.80–5.20)
RDW: 16 % — ABNORMAL HIGH (ref 11.5–14.5)
WBC: 5.7 10*3/uL (ref 3.6–11.0)

## 2015-05-29 LAB — ABO/RH: ABO/RH(D): B POS

## 2015-05-29 LAB — TROPONIN I: Troponin I: <0.03 ng/mL (ref <0.031)

## 2015-05-29 MED ORDER — SODIUM CHLORIDE 0.9 % IV SOLN
INTRAVENOUS | Status: DC
  Administered 2015-05-29 – 2015-05-31 (×3): via INTRAVENOUS

## 2015-05-29 MED ORDER — ALUM & MAG HYDROXIDE-SIMETH 200-200-20 MG/5ML PO SUSP: 30.0000 mL | Freq: Four times a day (QID) | ORAL | Status: DC | PRN

## 2015-05-29 MED ORDER — ONDANSETRON HCL 4 MG PO TABS: 4.0000 mg | ORAL_TABLET | Freq: Four times a day (QID) | ORAL | Status: DC | PRN

## 2015-05-29 MED ORDER — ACETAMINOPHEN 650 MG RE SUPP
650.0000 mg | Freq: Four times a day (QID) | RECTAL | Status: DC | PRN
  Administered 2015-05-31: 650 mg via RECTAL
  Filled 2015-05-29: qty 1

## 2015-05-29 MED ORDER — ONDANSETRON HCL 4 MG/2ML IJ SOLN: 4.0000 mg | Freq: Four times a day (QID) | INTRAMUSCULAR | Status: DC | PRN

## 2015-05-29 MED ORDER — SENNOSIDES-DOCUSATE SODIUM 8.6-50 MG PO TABS: 1.0000 | ORAL_TABLET | Freq: Every evening | ORAL | Status: DC | PRN

## 2015-05-29 MED ORDER — ESCITALOPRAM OXALATE 10 MG PO TABS
10.0000 mg | ORAL_TABLET | Freq: Every day | ORAL | Status: DC
  Administered 2015-05-30 – 2015-05-31 (×2): 10 mg via ORAL
  Filled 2015-05-29 (×2): qty 1

## 2015-05-29 MED ORDER — AMLODIPINE BESYLATE 5 MG PO TABS
2.5000 mg | ORAL_TABLET | Freq: Every day | ORAL | Status: DC
  Administered 2015-05-30: 2.5 mg via ORAL
  Filled 2015-05-29: qty 1

## 2015-05-29 MED ORDER — PANTOPRAZOLE SODIUM 40 MG IV SOLR: 40.0000 mg | INTRAVENOUS | Status: DC

## 2015-05-29 MED ORDER — PANTOPRAZOLE SODIUM 40 MG PO TBEC
40.0000 mg | DELAYED_RELEASE_TABLET | Freq: Every day | ORAL | Status: DC
  Administered 2015-05-30 – 2015-05-31 (×2): 40 mg via ORAL
  Filled 2015-05-29 (×2): qty 1

## 2015-05-29 MED ORDER — SODIUM CHLORIDE 0.9 % IV BOLUS (SEPSIS)
500.0000 mL | Freq: Once | INTRAVENOUS | Status: AC
  Administered 2015-05-29: 500 mL via INTRAVENOUS

## 2015-05-29 MED ORDER — ACETAMINOPHEN 325 MG PO TABS
650.0000 mg | ORAL_TABLET | Freq: Four times a day (QID) | ORAL | Status: DC | PRN
  Administered 2015-05-31: 650 mg via ORAL
  Filled 2015-05-29 (×2): qty 2

## 2015-05-29 NOTE — ED Notes (Signed)
Pt states this AM she noticed dark bloody stools, states she has gone to the bathroom several times today, denies any pain, awake and alert

## 2015-05-29 NOTE — ED Provider Notes (Signed)
[facility] Emergency Department Provider Note  ____________________________________________  Time seen: Approximately 6:33 PM  I have reviewed the triage vital signs and the nursing notes.   HISTORY  Chief Complaint Rectal Bleeding    HPI Taylor Watson is a 82 y.o. female who is not anticoagulated presenting with bright red blood per rectum. Patient reports that since 5 AM she has had multiple episodes of dark stool with some bright red blood. She has not had any pain in the rectum, abdominal pain, nausea or vomiting. She has had some lightheadedness when she stands up but no shortness of breath. No chest pain. No fever.   Past Medical History  Diagnosis Date  . Hypertension   . CVA (cerebral vascular accident) ([place])   . Anemia   . Hypercholesterolemia   . GERD (gastroesophageal reflux disease)   . Allergy   . Hx: UTI (urinary tract infection)   . History of blood transfusion   . History of kidney stones     Patient Active Problem List   Diagnosis Date Noted  . Unsteady gait 02/08/2015  . Eye discomfort 12/07/2014  . Neck nodule 10/07/2013  . Rectal bleeding 04/03/2013  . Light headedness 01/19/2013  . Loss of weight 10/22/2012  . Hypercholesterolemia 10/22/2012  . CVA (cerebral vascular accident) ([place]) 10/22/2012  . Anxiety 10/22/2012  . Essential hypertension, benign 10/22/2012    Past Surgical History  Procedure Laterality Date  . Cholecystectomy  1984  . Neck lesion biopsy  2015    Followed by Dr. Richardson Landry    Current Outpatient Rx  Name  Route  Sig  Dispense  Refill  . amLODipine (NORVASC) 2.5 MG tablet      TAKE ONE TABLET BY MOUTH EVERY DAY   30 tablet   2   . aspirin 81 MG tablet   Oral   Take 81 mg by mouth daily.         . Brinzolamide-Brimonidine (SIMBRINZA) 1-0.2 % SUSP   Ophthalmic   Apply 1 drop to eye 2 (two) times daily.         Marland Kitchen escitalopram (LEXAPRO) 10 MG tablet      TAKE ONE TABLET BY MOUTH  EVERY DAY   30 tablet   2   . omeprazole (PRILOSEC) 20 MG capsule      TAKE ONE CAPSULE BY MOUTH EVERY DAY   30 capsule   5     Allergies Dyazide; Monopril; Toprol xl; and Verapamil  Family History  Problem Relation Age of Onset  . Cancer Mother     unknown type  . Hypertension Mother   . Cancer Father     unknown type  . Breast cancer Daughter   . Colon cancer      grandfather    Social History Social History  Substance Use Topics  . Smoking status: Former Research scientist (life sciences)  . Smokeless tobacco: Former Systems developer    Types: Chew  . Alcohol Use: No    Review of Systems Constitutional: No fever/chills. Positive postural lightheadedness. No syncope. Eyes: No visual changes. ENT: No sore throat. Cardiovascular: Denies chest pain, palpitations. Respiratory: Denies shortness of breath.  No cough. Gastrointestinal: No abdominal pain.  No nausea, no vomiting.  No diarrhea.  No constipation. Positive dark stools with blood. Genitourinary: Negative for dysuria. Musculoskeletal: Negative for back pain. Skin: Negative for rash. Neurological: Negative for headaches, focal weakness or numbness.  10-point ROS otherwise negative.  ____________________________________________   PHYSICAL EXAM:  VITAL SIGNS: ED  Triage Vitals  Enc Vitals Group     BP 05/29/15 1701 143/80 mmHg     Pulse Rate 05/29/15 1701 92     Resp 05/29/15 1701 18     Temp 05/29/15 1701 98 F (36.7 C)     Temp Source 05/29/15 1701 Oral     SpO2 05/29/15 1701 94 %     Weight 05/29/15 1701 125 lb (56.7 kg)     Height 05/29/15 1701 5\' 5"  (1.651 m)     Head Cir --      Peak Flow --      Pain Score 05/29/15 1824 0     Pain Loc --      Pain Edu? --      Excl. in [place]? --     Constitutional: Alert and oriented. Well appearing and in no acute distress. Answer question appropriately. Eyes: Conjunctivae are normal.  EOMI. no conjunctival pallor. Head: Atraumatic. Nose: No congestion/rhinnorhea. Mouth/Throat: Mucous  membranes are moist.  Neck: No stridor.  Supple.  JVD. Cardiovascular: Normal rate, regular rhythm. No murmurs, rubs or gallops.  Respiratory: Normal respiratory effort.  No retractions. Lungs CTAB.  No wheezes, rales or ronchi. Gastrointestinal: Soft and nontender. No distention. No peritoneal signs. Genitourinary: She has multiple nonthrombosed nonbleeding external hemorrhoid. She has a very minimal less than 0.5 cm rectal prolapse that can be decompressed manually. However, there is some blood on the rectal prolapse tissue, and the patient has mucousy brown stool mixed in with it. Guaiac positive. Musculoskeletal: No LE edema.  Neurologic:  Normal speech and language. No gross focal neurologic deficits are appreciated.  Skin:  Skin is warm, dry and intact. No rash noted. Psychiatric: Mood and affect are normal. Speech and behavior are normal.  Normal judgement.  ____________________________________________   LABS (all labs ordered are listed, but only abnormal results are displayed)  Labs Reviewed  COMPREHENSIVE METABOLIC PANEL - Abnormal; Notable for the following:    Glucose, Bld 108 (*)    AST 14 (*)    ALT <5 (*)    All other components within normal limits  CBC - Abnormal; Notable for the following:    Hemoglobin 10.8 (*)    HCT 33.9 (*)    MCH 25.9 (*)    MCHC 31.8 (*)    RDW 16.0 (*)    All other components within normal limits  TROPONIN I  POC OCCULT BLOOD, ED   ____________________________________________  EKG  ED ECG REPORT I, Eula Listen, the attending physician, personally viewed and interpreted this ECG.   Date: 05/29/2015  EKG Time: 1706  Rate: 84  Rhythm: normal sinus rhythm  Axis: Normal  Intervals:none  ST&T Change: No ST elevation. Positive PAC.  ____________________________________________  RADIOLOGY  No results found.  ____________________________________________   PROCEDURES  Procedure(s) performed: no  Critical Care  performed: No ____________________________________________   INITIAL IMPRESSION / ASSESSMENT AND PLAN / ED COURSE  Pertinent labs & imaging results that were available during my care of the patient were reviewed by me and considered in my medical decision making (see chart for details).  82 y.o. female w/ GI bleed, possibly from hemorrhoids, but does have symptomatic anemia and will need admission to the hospital.  There is not indication of acute infection, such as diverticulitis.  Consider GI malignancy, as pt has never had colonoscopy.  Plan admission.  No indication for emergent transfusion, however T&S is sent.  ____________________________________________  FINAL CLINICAL IMPRESSION(S) / ED DIAGNOSES  Final diagnoses:  Gastrointestinal hemorrhage, unspecified gastritis, unspecified gastrointestinal hemorrhage type  Symptomatic anemia      NEW MEDICATIONS STARTED DURING THIS VISIT:  New Prescriptions   No medications on file     Eula Listen, MD 05/29/15 1839

## 2015-05-29 NOTE — ED Notes (Signed)
IV insertion attempted x2 unsuccessfully.

## 2015-05-29 NOTE — H&P (Signed)
[facility] [place] NAME: Taylor Watson    MR#:  [id number]  DATE OF BIRTH:  1918-10-23  DATE OF ADMISSION:  05/29/2015  PRIMARY CARE PHYSICIAN: [name], MD   REQUESTING/REFERRING PHYSICIAN: Dr. Mariea Clonts  CHIEF COMPLAINT:  Bloody bowel movements HISTORY OF PRESENT ILLNESS:  Taylor Watson  is a 82 y.o. female with a known history of CVA and essential hypertension who presents to above complaint. Patient reports since 5 AM this morning she had several bloody bowel movements. She denies abdominal pain associated with the bloody bowel movements. She has not had similar episode in the past. She denies shortness of breath, dizziness, chest pain or lightheadedness.  PAST MEDICAL HISTORY:   Past Medical History  Diagnosis Date  . Hypertension   . CVA (cerebral vascular accident) ([place])   . Anemia   . Hypercholesterolemia   . GERD (gastroesophageal reflux disease)   . Allergy   . Hx: UTI (urinary tract infection)   . History of blood transfusion   . History of kidney stones     PAST SURGICAL HISTORY:   Past Surgical History  Procedure Laterality Date  . Cholecystectomy  1984  . Neck lesion biopsy  2015    Followed by Dr. Richardson Landry    SOCIAL HISTORY:   Social History  Substance Use Topics  . Smoking status: Former Research scientist (life sciences)  . Smokeless tobacco: Former Systems developer    Types: Chew  . Alcohol Use: No    FAMILY HISTORY:   Family History  Problem Relation Age of Onset  . Cancer Mother     unknown type  . Hypertension Mother   . Cancer Father     unknown type  . Breast cancer Daughter   . Colon cancer      grandfather    DRUG ALLERGIES:   Allergies  Allergen Reactions  . Dyazide [Hydrochlorothiazide W-Triamterene]   . Monopril [Fosinopril]   . Toprol Xl [Metoprolol Tartrate]   . Verapamil      REVIEW OF SYSTEMS:  CONSTITUTIONAL: No fever, fatigue or weakness.  EYES: No blurred or double vision.  EARS,  NOSE, AND THROAT: No tinnitus or ear pain.  RESPIRATORY: No cough, shortness of breath, wheezing or hemoptysis.  CARDIOVASCULAR: No chest pain, orthopnea, edema.  GASTROINTESTINAL: No nausea, vomiting or abdominal pain. + Bloody bowel movements GENITOURINARY: No dysuria, hematuria.  ENDOCRINE: No polyuria, nocturia,  HEMATOLOGY: No anemia, easy bruising or bleeding SKIN: No rash or lesion. MUSCULOSKELETAL: No joint pain or arthritis.   NEUROLOGIC: No tingling, numbness, weakness.  PSYCHIATRY: No anxiety or depression.   MEDICATIONS AT HOME:   Prior to Admission medications   Medication Sig Start Date End Date Taking? Authorizing Provider  amLODipine (NORVASC) 2.5 MG tablet TAKE ONE TABLET BY MOUTH EVERY DAY 03/18/15  Yes [name], MD  aspirin 81 MG tablet Take 81 mg by mouth daily.   Yes Historical Provider, MD  Brinzolamide-Brimonidine [facility]) 1-0.2 % SUSP Apply 1 drop to eye 2 (two) times daily.   Yes Historical Provider, MD  escitalopram (LEXAPRO) 10 MG tablet TAKE ONE TABLET BY MOUTH EVERY DAY 03/18/15  Yes [name], MD  omeprazole (PRILOSEC) 20 MG capsule TAKE ONE CAPSULE BY MOUTH EVERY DAY 02/06/15  Yes [name], MD      VITAL SIGNS:  Blood pressure 137/61, pulse 76, temperature 98 F (36.7 C), temperature source Oral, resp. rate 21, height 5\' 5"  (1.651 m), weight 56.7 kg (125 lb), SpO2 95 %.  PHYSICAL EXAMINATION:  GENERAL:  82 y.o.-year-old patient lying in the bed with no acute distress. Appears much younger than her stated age EYES: Pupils equal, round, reactive to light and accommodation. No scleral icterus. Extraocular muscles intact.  HEENT: Head atraumatic, normocephalic. Oropharynx and nasopharynx clear.  NECK:  Supple, no jugular venous distention. No thyroid enlargement, no tenderness.  LUNGS: Normal breath sounds bilaterally, no wheezing, rales,rhonchi or crepitation. No use of accessory muscles of respiration.  CARDIOVASCULAR: S1, S2 normal.  No murmurs, rubs, or gallops.  ABDOMEN: Soft, nontender, nondistended. Bowel sounds present. No organomegaly or mass.  EXTREMITIES: No pedal edema, cyanosis, or clubbing.  NEUROLOGIC: Cranial nerves II through XII are grossly intact. No focal deficits. PSYCHIATRIC: The patient is alert and oriented x 3.  SKIN: No obvious rash, lesion, or ulcer.   LABORATORY PANEL:   CBC  Recent Labs Lab 05/29/15 1703  WBC 5.7  HGB 10.8*  HCT 33.9*  PLT 271   ------------------------------------------------------------------------------------------------------------------  Chemistries   Recent Labs Lab 05/29/15 1703  NA 139  K 4.1  CL 105  CO2 29  GLUCOSE 108*  BUN 18  CREATININE 0.81  CALCIUM 9.0  AST 14*  ALT <5*  ALKPHOS 93  BILITOT 0.7   ------------------------------------------------------------------------------------------------------------------  Cardiac Enzymes No results for input(s): TROPONINI in the last 168 hours. ------------------------------------------------------------------------------------------------------------------  RADIOLOGY:  No results found.  EKG:   Normal sinus rhythm with T-wave inversions in the anteroseptal leads  IMPRESSION AND PLAN:   82 year old female who presents with bright red blood per rectum.  1. Bright red blood per rectum: I suspect this is diverticular in nature. Continue to monitor hemoglobin every 6 hours. I have discussed with the patient and her family at bedside given her age a colonoscopy would not be performed. If patient has severe bloody bowel movements then we can consider GI bleeding scan. Continue PPI.  2. History of CVA without residual deficits: Hold aspirin for now.  3. Essential hypertension: Continue norvasc.  4. Lateral ischemia on EKG: repeat EKG in am. Patient is without chest pain or shortness of breath     All the records are reviewed and case discussed with ED provider. Management plans discussed  with the patient and she is in agreement.  CODE STATUS: DNR  TOTAL TIME TAKING CARE OF THIS PATIENT: 50 minutes.    Chaze Hruska M.D on 05/29/2015 at 7:31 PM  Between 7am to 6pm - Pager - [phone] After 6pm go to [email or web address] - password [email or web address] [facility]  Office  [phone]  CC: Primary care physician; [name], MD

## 2015-05-30 LAB — VITAMIN B12: Vitamin B-12: 127 pg/mL — ABNORMAL LOW (ref 180–914)

## 2015-05-30 LAB — CBC
HEMATOCRIT: 21.6 % — AB (ref 35.0–47.0)
HEMOGLOBIN: 7 g/dL — AB (ref 12.0–16.0)
MCH: 26.2 pg (ref 26.0–34.0)
MCHC: 32.3 g/dL (ref 32.0–36.0)
MCV: 81 fL (ref 80.0–100.0)
Platelets: 196 10*3/uL (ref 150–440)
RBC: 2.66 MIL/uL — AB (ref 3.80–5.20)
RDW: 15.9 % — ABNORMAL HIGH (ref 11.5–14.5)
WBC: 4.1 10*3/uL (ref 3.6–11.0)

## 2015-05-30 LAB — BASIC METABOLIC PANEL
ANION GAP: 4 — AB (ref 5–15)
BUN: 19 mg/dL (ref 6–20)
CO2: 25 mmol/L (ref 22–32)
Calcium: 7.7 mg/dL — ABNORMAL LOW (ref 8.9–10.3)
Chloride: 106 mmol/L (ref 101–111)
Creatinine, Ser: 0.83 mg/dL (ref 0.44–1.00)
GFR calc Af Amer: >60 mL/min (ref >60)
GFR, EST NON AFRICAN AMERICAN: 59 mL/min — AB (ref >60)
Glucose, Bld: 114 mg/dL — ABNORMAL HIGH (ref 65–99)
POTASSIUM: 3.5 mmol/L (ref 3.5–5.1)
SODIUM: 135 mmol/L (ref 135–145)

## 2015-05-30 LAB — HEMOGLOBIN AND HEMATOCRIT, BLOOD
HCT: 22 % — ABNORMAL LOW (ref 35.0–47.0)
Hemoglobin: 7 g/dL — ABNORMAL LOW (ref 12.0–16.0)

## 2015-05-30 LAB — FOLATE: FOLATE: 10.2 ng/mL (ref >5.9)

## 2015-05-30 LAB — RETICULOCYTES
RBC.: 3.44 MIL/uL — ABNORMAL LOW (ref 3.80–5.20)
RETIC COUNT ABSOLUTE: 31 10*3/uL (ref 19.0–183.0)
Retic Ct Pct: 0.9 % (ref 0.4–3.1)

## 2015-05-30 LAB — PREPARE RBC (CROSSMATCH)

## 2015-05-30 LAB — HEMOGLOBIN: HEMOGLOBIN: 9.1 g/dL — AB (ref 12.0–16.0)

## 2015-05-30 LAB — IRON AND TIBC
IRON: 28 ug/dL (ref 28–170)
SATURATION RATIOS: 9 % — AB (ref 10.4–31.8)
TIBC: 317 ug/dL (ref 250–450)
UIBC: 289 ug/dL

## 2015-05-30 LAB — FERRITIN: Ferritin: 12 ng/mL (ref 11–307)

## 2015-05-30 MED ORDER — SODIUM CHLORIDE 0.9 % IV SOLN
Freq: Once | INTRAVENOUS | Status: AC
  Administered 2015-05-30: 23:00:00 via INTRAVENOUS

## 2015-05-30 MED ORDER — HALOPERIDOL LACTATE 5 MG/ML IJ SOLN: 5.0000 mg | Freq: Once | INTRAMUSCULAR | Status: DC

## 2015-05-30 MED ORDER — SODIUM CHLORIDE 0.9 % IV SOLN
Freq: Once | INTRAVENOUS | Status: AC
  Administered 2015-05-30: 15:00:00 via INTRAVENOUS

## 2015-05-30 MED ORDER — ACETAMINOPHEN 325 MG PO TABS
650.0000 mg | ORAL_TABLET | Freq: Once | ORAL | Status: AC
  Administered 2015-05-30: 14:00:00 650 mg via ORAL
  Filled 2015-05-30: qty 2

## 2015-05-30 NOTE — Progress Notes (Signed)
Initial Nutrition Assessment   INTERVENTION:   Coordination of Care: await diet order progression as medically able Medical Food Supplement Therapy: will recommend Ensure once diet order advanced and/or will recommend Boost Breeze on follow if unable to advance   NUTRITION DIAGNOSIS:   Inadequate oral intake related to acute illness as evidenced by  (CL diet order)  GOAL:   Patient will meet greater than or equal to 90% of their needs  MONITOR:    (Energy Intake, Electrolyte and Renal Profile, Anemia Profile, Digestive system)  REASON FOR ASSESSMENT:   Malnutrition Screening Tool    ASSESSMENT:   Pt admitted with GI bleed with active bleeding this am as well per Nsg.  Past Medical History  Diagnosis Date  . Hypertension   . CVA (cerebral vascular accident) ([place])   . Anemia   . Hypercholesterolemia   . GERD (gastroesophageal reflux disease)   . Allergy   . Hx: UTI (urinary tract infection)   . History of blood transfusion   . History of kidney stones      Diet Order:  Diet clear liquid Room service appropriate?: Yes; Fluid consistency:: Thin    Current Nutrition: Pt reports tolerating a popsicle and broth.   Food/Nutrition-Related History: Pt reports good appetite PTA, eating 3-4 meals a day and likes snacks. Pt reports drinking Ensure at times but not consistently.   Scheduled Medications:  . sodium chloride   Intravenous Once  . acetaminophen  650 mg Oral Once  . escitalopram  10 mg Oral Daily  . pantoprazole  40 mg Oral Daily    Continuous Medications:  . sodium chloride 75 mL/hr at 05/30/15 1151     Electrolyte/Renal Profile and Glucose Profile:   Recent Labs Lab 05/28/15 1300 05/29/15 1703 05/30/15 0554  NA 140 139 135  K 3.7 4.1 3.5  CL 101 105 106  CO2 32 29 25  BUN 8 18 19   CREATININE 0.73 0.81 0.83  CALCIUM 9.4 9.0 7.7*  GLUCOSE 87 108* 114*   Protein Profile:  Recent Labs Lab 05/28/15 1300 05/29/15 1703  ALBUMIN 3.9 3.6     Gastrointestinal Profile: Last BM: 05/30/2015 red stools   Nutrition-Focused Physical Exam Findings:  Unable to complete Nutrition-Focused physical exam at this time.    Weight Change: Pt reports weight has been up and down. Pt reports UBW of 140lbs and currently 125lbs. Measured weight of 121lbs. Per [facility] weight encounters, 2% weight loss in 5 months.    Height:   Ht Readings from Last 1 Encounters:  05/29/15 5\' 5"  (1.651 m)    Weight:   Wt Readings from Last 1 Encounters:  05/29/15 121 lb 6.4 oz (55.067 kg)   Wt Readings from Last 10 Encounters:  05/29/15 121 lb 6.4 oz (55.067 kg)  05/28/15 125 lb 4 oz (56.813 kg)  02/07/15 124 lb (56.246 kg)  12/05/14 124 lb 4 oz (56.359 kg)  08/05/14 130 lb 6 oz (59.138 kg)  02/14/14 132 lb 8 oz (60.102 kg)  10/04/13 136 lb 8 oz (61.916 kg)  04/03/13 150 lb 8 oz (68.266 kg)  03/15/13 151 lb 8 oz (68.72 kg)  01/16/13 148 lb 8 oz (67.359 kg)     BMI:  Body mass index is 20.2 kg/(m^2).  Estimated Nutritional Needs:   Kcal:  BEE: 1112kcals, TEE: (IF 1.1-1.3)(AF 1.2) 1255-1483kcals  Protein:  55-66g protein (1.0-1.2g/kg)  Fluid:  1375-1692mL of fluid (25-52mL/kg)  EDUCATION NEEDS:   No education needs identified at this time  MODERATE Care Level  Dwyane Luo, [place], [place] Pager [phone] Weekend/On-Call Pager [phone]

## 2015-05-30 NOTE — Progress Notes (Signed)
[place] at [place] NAME: Taylor Watson    MR#:  [id number]  DATE OF BIRTH:  Oct 20, 1918  SUBJECTIVE:   Patient continues to have bloody bowel movements. She had 2 this morning. She denies abdominal pain with bloody bowel movements. She denies dizziness or lightheadedness.  REVIEW OF SYSTEMS:    Review of Systems  Constitutional: Negative for fever, chills and malaise/fatigue.  HENT: Negative for sore throat.   Eyes: Negative for blurred vision.  Respiratory: Negative for cough, hemoptysis, shortness of breath and wheezing.   Cardiovascular: Negative for chest pain, palpitations and leg swelling.  Gastrointestinal: Positive for blood in stool. Negative for nausea, vomiting, abdominal pain and diarrhea.  Genitourinary: Negative for dysuria.  Musculoskeletal: Negative for back pain.  Neurological: Negative for dizziness, tremors and headaches.  Endo/Heme/Allergies: Does not bruise/bleed easily.    Tolerating Diet: Clear liquid diet, yes      DRUG ALLERGIES:   Allergies  Allergen Reactions  . Dyazide [Hydrochlorothiazide W-Triamterene]   . Monopril [Fosinopril]   . Toprol Xl [Metoprolol Tartrate]   . Verapamil     VITALS:  Blood pressure 111/45, pulse 84, temperature 98.4 F (36.9 C), temperature source Oral, resp. rate 18, height 5\' 5"  (1.651 m), weight 55.067 kg (121 lb 6.4 oz), SpO2 96 %.  PHYSICAL EXAMINATION:   Physical Exam  Constitutional: She is oriented to person, place, and time and well-developed, well-nourished, and in no distress. No distress.  HENT:  Head: Normocephalic.  Eyes: No scleral icterus.  Neck: Normal range of motion. Neck supple. No JVD present. No tracheal deviation present.  Cardiovascular: Normal rate and regular rhythm.  Exam reveals no gallop and no friction rub.   Murmur heard. Pulmonary/Chest: Effort normal and breath sounds normal. No respiratory distress. She has no wheezes. She  has no rales. She exhibits no tenderness.  Abdominal: Soft. Bowel sounds are normal. She exhibits no distension and no mass. There is no tenderness. There is no rebound and no guarding.  Musculoskeletal: Normal range of motion. She exhibits no edema.  Neurological: She is alert and oriented to person, place, and time.  Skin: Skin is warm. No rash noted. No erythema.  Psychiatric: Affect and judgment normal.      LABORATORY PANEL:   CBC  Recent Labs Lab 05/30/15 0554  WBC 4.1  HGB 7.0*  HCT 21.6*  PLT 196   ------------------------------------------------------------------------------------------------------------------  Chemistries   Recent Labs Lab 05/29/15 1703 05/30/15 0554  NA 139 135  K 4.1 3.5  CL 105 106  CO2 29 25  GLUCOSE 108* 114*  BUN 18 19  CREATININE 0.81 0.83  CALCIUM 9.0 7.7*  AST 14*  --   ALT <5*  --   ALKPHOS 93  --   BILITOT 0.7  --    ------------------------------------------------------------------------------------------------------------------  Cardiac Enzymes  Recent Labs Lab 05/29/15 2012  TROPONINI <0.03   ------------------------------------------------------------------------------------------------------------------  RADIOLOGY:  No results found.   ASSESSMENT AND PLAN:   82 year old female who presents with bright red blood per rectum.  1. Bright red blood per rectum: Suspect this is diverticular in nature. Patient continues to have bloody bowel movements however has decreased since yesterday. Follow-up on GI recommendations. Given her age unlikely patient will undergo colonoscopy, this was discussed with family and admission.   2. History of CVA without residual deficits: Hold aspirin for now due to problem #1.  3. Essential hypertension: Blood pressure low this morning so we'll hold Norvasc. Continue  to monitor.  4. Lateral ischemia on EKG:  Patient is without chest pain or shortness of breath  5. Acute blood  loss anemia: Patient has consented for blood transfusion. I will transfuse 1 unit of blood due to problem #1.    Management plans discussed with the patient and she is in agreement.  CODE STATUS: DNR  TOTAL TIME TAKING CARE OF THIS PATIENT: 30 minutes.     POSSIBLE D/C 1-2 days to home, DEPENDING ON CLINICAL CONDITION.   [name] M.D on 05/30/2015 at 11:27 AM  Between 7am to 6pm - Pager - [phone] After 6pm go to [email or web address] - password [email or web address] [facility]  Office  [phone]  CC: Primary care physician; [name], MD  Note: This dictation was prepared with Dragon dictation along with smaller phrase technology. Any transcriptional errors that result from this process are unintentional.

## 2015-05-30 NOTE — Consult Note (Signed)
GI Inpatient Consult Note  Reason for Consult: GI bleed   Attending Requesting Consult: Mody  History of Present Illness: Taylor Watson is a 82 y.o. female with a h/o CVA not on anitcoagulation, HTN, HLD, GERD, and anemia admitted with a GI bleed.  Patient presented to the [facility] ED after experiencing multiple episodes of "dark" stool since 5am.  She is somewhat of a poor historian, so she is unsure if she was having dark stools as early as Wednesday morning.  Occasionally there has been a small amount of BRB in the commode water as well.  No fevers, chills, nausea, vomiting, or abdominal pain.  Patient noted some lightheadedness upon standing, but no dizziness, CP, or SOB.  In the ED, labs revealed Hgb 10.8, MCV 81.4. Ferritin 12, iron 28, TIBC 317.  Outpatient po Protonix 40mg  was continued, and patient was admitted for further management.  Since admission, serial Hgb declined to 9.1 and 7.0.  MCV stable at 81.  One unit PRBCs was ordered.  Today, Ms. Korell reports she is still had a dark this morning.  There was no BRB noted.  She endorses feeling lightheaded and "shakey" as well.  Otherwise, she denies abdominal pain, nausea, vomiting, or diarrhea.  Her weight and appetite have remained stable.  She denies any recent changes in health.  Also no significant NSAID use.  Patient has never had a colonoscopy, and denies a family history of CCA.   Past Medical History:  Past Medical History  Diagnosis Date  . Hypertension   . CVA (cerebral vascular accident) ([place])   . Anemia   . Hypercholesterolemia   . GERD (gastroesophageal reflux disease)   . Allergy   . Hx: UTI (urinary tract infection)   . History of blood transfusion   . History of kidney stones     Problem List: Patient Active Problem List   Diagnosis Date Noted  . GIB (gastrointestinal bleeding) 05/29/2015  . Unsteady gait 02/08/2015  . Eye discomfort 12/07/2014  . Neck nodule 10/07/2013  . Rectal bleeding 04/03/2013   . Light headedness 01/19/2013  . Loss of weight 10/22/2012  . Hypercholesterolemia 10/22/2012  . CVA (cerebral vascular accident) ([place]) 10/22/2012  . Anxiety 10/22/2012  . Essential hypertension, benign 10/22/2012    Past Surgical History: Past Surgical History  Procedure Laterality Date  . Cholecystectomy  1984  . Neck lesion biopsy  2015    Followed by Dr. Richardson Landry    Allergies: Allergies  Allergen Reactions  . Dyazide [Hydrochlorothiazide W-Triamterene]   . Monopril [Fosinopril]   . Toprol Xl [Metoprolol Tartrate]   . Verapamil     Home Medications: Prescriptions prior to admission  Medication Sig Dispense Refill Last Dose  . amLODipine (NORVASC) 2.5 MG tablet TAKE ONE TABLET BY MOUTH EVERY DAY 30 tablet 2 05/29/2015 at Unknown time  . aspirin 81 MG tablet Take 81 mg by mouth daily.   05/29/2015 at 0800  . Brinzolamide-Brimonidine (SIMBRINZA) 1-0.2 % SUSP Apply 1 drop to eye 2 (two) times daily.   05/28/2015 at Unknown time  . escitalopram (LEXAPRO) 10 MG tablet TAKE ONE TABLET BY MOUTH EVERY DAY 30 tablet 2 05/29/2015 at Unknown time  . omeprazole (PRILOSEC) 20 MG capsule TAKE ONE CAPSULE BY MOUTH EVERY DAY 30 capsule 5 05/29/2015 at Unknown time   Home medication reconciliation was completed with the patient.   Scheduled Inpatient Medications:   . amLODipine  2.5 mg Oral Daily  . escitalopram  10 mg Oral Daily  .  pantoprazole  40 mg Oral Daily    Continuous Inpatient Infusions:   . sodium chloride 75 mL/hr at 05/30/15 0705    PRN Inpatient Medications:  acetaminophen **OR** acetaminophen, alum & mag hydroxide-simeth, ondansetron **OR** ondansetron (ZOFRAN) IV, senna-docusate  Family History: family history includes Breast cancer in her daughter; Cancer in her father and mother; Hypertension in her mother.    Social History:   reports that she has quit smoking. She has quit using smokeless tobacco. Her smokeless tobacco use included Chew. She reports that she does  not drink alcohol or use illicit drugs.   Review of Systems: Constitutional: Weight is stable.  Eyes: No changes in vision. ENT: No oral lesions, sore throat.  GI: see HPI.  Heme/Lymph: No easy bruising.  CV: No chest pain.  GU: No hematuria.  Integumentary: No rashes.  Neuro: No headaches.  Psych: No depression/anxiety.  Endocrine: No heat/cold intolerance.  Allergic/Immunologic: No urticaria.  Resp: No cough, SOB.  Musculoskeletal: No joint swelling.    Physical Examination: BP 111/45 mmHg  Pulse 84  Temp(Src) 98.4 F (36.9 C) (Oral)  Resp 18  Ht 5\' 5"  (1.651 m)  Wt 55.067 kg (121 lb 6.4 oz)  BMI 20.20 kg/m2  SpO2 96% Gen: Pleasant, thin-appearing African-American female; NAD, alert and oriented x 4; daughter and granddaughter at bedside HEENT: PEERLA, EOMI, Neck: supple, no JVD or thyromegaly Chest: CTA bilaterally, no wheezes, crackles, or other adventitious sounds CV: RRR, no m/g/c/r Abd: soft, NT, ND, +BS in all four quadrants; no HSM, guarding, ridigity, or rebound tenderness Ext: no edema, well perfused with 2+ pulses, Skin: no rash or lesions noted Lymph: no LAD  Data: Lab Results  Component Value Date   WBC 4.1 05/30/2015   HGB 7.0* 05/30/2015   HCT 21.6* 05/30/2015   MCV 81.0 05/30/2015   PLT 196 05/30/2015    Recent Labs Lab 05/29/15 1703 05/29/15 2242 05/30/15 0554  HGB 10.8* 9.1* 7.0*   Lab Results  Component Value Date   NA 135 05/30/2015   K 3.5 05/30/2015   CL 106 05/30/2015   CO2 25 05/30/2015   BUN 19 05/30/2015   CREATININE 0.83 05/30/2015   GLU 94 11/06/2013   Lab Results  Component Value Date   ALT <5* 05/29/2015   AST 14* 05/29/2015   ALKPHOS 93 05/29/2015   BILITOT 0.7 05/29/2015   No results for input(s): APTT, INR, PTT in the last 168 hours.   Assessment/Plan: Taylor Watson is a 82 y.o. female with a h/o CVA not on anitcoagulation, HTN, HLD, GERD, and anemia admitted with a GI bleed.  Patient reported multiple  episodes of "dark stool" with BRB since 5am.  Labs revealed symptomatic anemia at 10.8 in the ED; Hgb dropped to 7.0 on serial checks. Iron studies also c/w IDA.  She continues to report small volume dark stools and lightheadedness.  Given patient's age and multiple medical comorbidities, she a poor candidate for scopes.  If dark stools persist or significant BRBPR occurs, consider tagged scan.  If Hgb continues to drop despite transfusion, may consider CT a/p to exclude malignancy.  Futher recs per Dr. Rayann Heman.  Recommendations: - Monitor Hgb, transfuse if <7 - If significant melena/BRBPR persists, consider tagged scan - If Hgb continues to drop despite transfusions but bleeding subsides, consider CT a/p to exclude malignancy - Poor candidate for scopes d/t age and comorbidities  Thank you for the consult. We will follow along with you. Please call with questions or concerns.  Lavera Guise, PA-C [facility] Phone: [phone] Pager: [phone]

## 2015-05-31 ENCOUNTER — Inpatient Hospital Stay (HOSPITAL_COMMUNITY)
Admission: AD | Admit: 2015-05-31 | Discharge: 2015-06-05 | DRG: 378 | Disposition: A | Discharge status: Home / Self Care | Payer: Medicare Other | Source: Other Acute Inpatient Hospital | Attending: Internal Medicine | Admitting: Internal Medicine

## 2015-05-31 ENCOUNTER — Inpatient Hospital Stay: Payer: Medicare Other

## 2015-05-31 ENCOUNTER — Encounter (HOSPITAL_COMMUNITY): Payer: Self-pay | Admitting: *Deleted

## 2015-05-31 DIAGNOSIS — K5730 Diverticulosis of large intestine without perforation or abscess without bleeding: Secondary | ICD-10-CM | POA: Diagnosis not present

## 2015-05-31 DIAGNOSIS — R5383 Other fatigue: Secondary | ICD-10-CM | POA: Diagnosis not present

## 2015-05-31 DIAGNOSIS — E785 Hyperlipidemia, unspecified: Secondary | ICD-10-CM | POA: Diagnosis not present

## 2015-05-31 DIAGNOSIS — K37 Unspecified appendicitis: Secondary | ICD-10-CM | POA: Diagnosis not present

## 2015-05-31 DIAGNOSIS — F32A Depression, unspecified: Secondary | ICD-10-CM | POA: Diagnosis present

## 2015-05-31 DIAGNOSIS — K625 Hemorrhage of anus and rectum: Secondary | ICD-10-CM | POA: Diagnosis present

## 2015-05-31 DIAGNOSIS — Z66 Do not resuscitate: Secondary | ICD-10-CM | POA: Diagnosis not present

## 2015-05-31 DIAGNOSIS — R918 Other nonspecific abnormal finding of lung field: Secondary | ICD-10-CM | POA: Diagnosis not present

## 2015-05-31 DIAGNOSIS — Z7189 Other specified counseling: Secondary | ICD-10-CM | POA: Insufficient documentation

## 2015-05-31 DIAGNOSIS — D500 Iron deficiency anemia secondary to blood loss (chronic): Secondary | ICD-10-CM | POA: Diagnosis not present

## 2015-05-31 DIAGNOSIS — I10 Essential (primary) hypertension: Secondary | ICD-10-CM | POA: Diagnosis not present

## 2015-05-31 DIAGNOSIS — D649 Anemia, unspecified: Secondary | ICD-10-CM | POA: Diagnosis not present

## 2015-05-31 DIAGNOSIS — D62 Acute posthemorrhagic anemia: Secondary | ICD-10-CM | POA: Diagnosis not present

## 2015-05-31 DIAGNOSIS — K922 Gastrointestinal hemorrhage, unspecified: Secondary | ICD-10-CM | POA: Diagnosis not present

## 2015-05-31 DIAGNOSIS — D638 Anemia in other chronic diseases classified elsewhere: Secondary | ICD-10-CM | POA: Diagnosis present

## 2015-05-31 DIAGNOSIS — Z79899 Other long term (current) drug therapy: Secondary | ICD-10-CM

## 2015-05-31 DIAGNOSIS — R933 Abnormal findings on diagnostic imaging of other parts of digestive tract: Secondary | ICD-10-CM | POA: Diagnosis not present

## 2015-05-31 DIAGNOSIS — R0602 Shortness of breath: Secondary | ICD-10-CM

## 2015-05-31 DIAGNOSIS — Z87891 Personal history of nicotine dependence: Secondary | ICD-10-CM | POA: Diagnosis not present

## 2015-05-31 DIAGNOSIS — Z515 Encounter for palliative care: Secondary | ICD-10-CM | POA: Diagnosis not present

## 2015-05-31 DIAGNOSIS — E118 Type 2 diabetes mellitus with unspecified complications: Secondary | ICD-10-CM | POA: Diagnosis present

## 2015-05-31 DIAGNOSIS — Z8673 Personal history of transient ischemic attack (TIA), and cerebral infarction without residual deficits: Secondary | ICD-10-CM

## 2015-05-31 DIAGNOSIS — Z888 Allergy status to other drugs, medicaments and biological substances status: Secondary | ICD-10-CM | POA: Diagnosis not present

## 2015-05-31 DIAGNOSIS — R6510 Systemic inflammatory response syndrome (SIRS) of non-infectious origin without acute organ dysfunction: Secondary | ICD-10-CM | POA: Diagnosis not present

## 2015-05-31 DIAGNOSIS — F419 Anxiety disorder, unspecified: Secondary | ICD-10-CM | POA: Diagnosis present

## 2015-05-31 DIAGNOSIS — F329 Major depressive disorder, single episode, unspecified: Secondary | ICD-10-CM | POA: Diagnosis present

## 2015-05-31 DIAGNOSIS — D509 Iron deficiency anemia, unspecified: Secondary | ICD-10-CM | POA: Diagnosis not present

## 2015-05-31 DIAGNOSIS — K921 Melena: Secondary | ICD-10-CM | POA: Diagnosis not present

## 2015-05-31 DIAGNOSIS — R509 Fever, unspecified: Secondary | ICD-10-CM | POA: Diagnosis present

## 2015-05-31 DIAGNOSIS — K5790 Diverticulosis of intestine, part unspecified, without perforation or abscess without bleeding: Secondary | ICD-10-CM | POA: Diagnosis not present

## 2015-05-31 DIAGNOSIS — K219 Gastro-esophageal reflux disease without esophagitis: Secondary | ICD-10-CM | POA: Diagnosis present

## 2015-05-31 DIAGNOSIS — A419 Sepsis, unspecified organism: Secondary | ICD-10-CM | POA: Diagnosis present

## 2015-05-31 DIAGNOSIS — E46 Unspecified protein-calorie malnutrition: Secondary | ICD-10-CM | POA: Diagnosis not present

## 2015-05-31 DIAGNOSIS — J90 Pleural effusion, not elsewhere classified: Secondary | ICD-10-CM | POA: Diagnosis not present

## 2015-05-31 HISTORY — DX: Major depressive disorder, single episode, unspecified: F32.9

## 2015-05-31 HISTORY — DX: Depression, unspecified: F32.A

## 2015-05-31 LAB — CBC
HCT: 22.7 % — ABNORMAL LOW (ref 35.0–47.0)
Hemoglobin: 7.5 g/dL — ABNORMAL LOW (ref 12.0–16.0)
MCH: 26.9 pg (ref 26.0–34.0)
MCHC: 33 g/dL (ref 32.0–36.0)
MCV: 81.6 fL (ref 80.0–100.0)
Platelets: 168 10*3/uL (ref 150–440)
RBC: 2.78 MIL/uL — ABNORMAL LOW (ref 3.80–5.20)
RDW: 14.9 % — AB (ref 11.5–14.5)
WBC: 6.8 10*3/uL (ref 3.6–11.0)

## 2015-05-31 LAB — BASIC METABOLIC PANEL
Anion gap: 3 — ABNORMAL LOW (ref 5–15)
BUN: 20 mg/dL (ref 6–20)
CALCIUM: 7.6 mg/dL — AB (ref 8.9–10.3)
CO2: 26 mmol/L (ref 22–32)
CREATININE: 0.76 mg/dL (ref 0.44–1.00)
Chloride: 110 mmol/L (ref 101–111)
Glucose, Bld: 124 mg/dL — ABNORMAL HIGH (ref 65–99)
Potassium: 3.5 mmol/L (ref 3.5–5.1)
SODIUM: 139 mmol/L (ref 135–145)

## 2015-05-31 LAB — HEMOGLOBIN
HEMOGLOBIN: 6.7 g/dL — AB (ref 12.0–16.0)
HEMOGLOBIN: 7.7 g/dL — AB (ref 12.0–16.0)
Hemoglobin: 6.3 g/dL — ABNORMAL LOW (ref 12.0–16.0)

## 2015-05-31 LAB — PREPARE RBC (CROSSMATCH)

## 2015-05-31 MED ORDER — TECHNETIUM TC 99M-LABELED RED BLOOD CELLS IV KIT
21.7000 | PACK | Freq: Once | INTRAVENOUS | Status: AC | PRN
  Administered 2015-05-31: 18:00:00 21.7 via INTRAVENOUS

## 2015-05-31 MED ORDER — SODIUM CHLORIDE 0.9 % IV SOLN
Freq: Once | INTRAVENOUS | Status: AC
  Administered 2015-05-31: 13:00:00 via INTRAVENOUS

## 2015-05-31 NOTE — Progress Notes (Signed)
Spoke with Dr.Sainana and  Family made aware after bleeding scan this shift  patient will be transferred to [facility] when bed is available. Will continue to monitor

## 2015-05-31 NOTE — Progress Notes (Signed)
Pt. Is being transferred to [facility] when bed available.  She will be transferred to STepdown unit at [facility].    Her BP is on the low side after bleeding scan and she has a fever of 101.  Give Tylenol and transfer her to [facility] unit here.  Notified staff at 1C and also spoke to Charge nurse in the CCU about transfer.    Time spent in [place]: 60 min.

## 2015-05-31 NOTE — Plan of Care (Signed)
Problem: Bowel/Gastric: Goal: Will show no signs and symptoms of gastrointestinal bleeding Outcome: Progressing Patient having gas at this time; no bleeding

## 2015-05-31 NOTE — Progress Notes (Signed)
82 yo F with NIDDM, HTN p/w painless BRBPR.  Ref: Bobetta Lime at [facility]  Thinking that this is a diverticular bleed; because persistent x2 days and requiring second transfusion (Got to 6.7 Hgb, transfused 2 units.  Then dropped to <7 again, got additional one unit today), did tagged cell scan, shows bleeding in cecum/prox ascending colon.  Vascular at [facility] can't embolize.  They don't have IR available, so transfer to [facility] in case needs embolization.    I spoke with Dr. Reesa Chew who recommends that if she is stable without large active bleeding when she arrives, obtain CTA at that time, and he will evaluate either tonight or tomorrow.  If active large bleed or hemodynamically unstable, call IR immediately.

## 2015-05-31 NOTE — Progress Notes (Signed)
18:52  Patient bleeding scan results  20:06  Dr. Verdell Carmine put order in for transfer to Stepdown status  20:15  Received call from Dr. Verdell Carmine to transfer for Stepdown status until patient could get available from [facility]   20:22  Noted in I&O patient had medium bloody BM  20:50  Received Bed request from pager ,  21:00  [facility] called ICU for patient report, informed patient still on 1 C, she stated they would call floor  21:15  This nurse called [facility] back, Suanne Marker stated that they were on the way to get patient.  Room assignment would be:  2 C, room 5  21:18  Noted in I&O patient had medium bloody BM   21:20  Informed Nursing Supervisor of [facility] on the way to get patient, instructed to go and see patient, if stable would leave patient on floor since [facility] on the way.  21:28  This nurse went to floor and observed patient.  B/P 113/31, HR 88. NS infusing @ 75 mls/hr.  Patient resting comfortable, family at bedside.  Nurse Myriam Jacobson on phone at this time with [facility] given report.  2150:  [place] arrived.    21:58  B/P 102/40, HR 62  22:00  Patient transferred to [facility]

## 2015-05-31 NOTE — Plan of Care (Addendum)
Problem: Bowel/Gastric: Goal: Will show no signs and symptoms of gastrointestinal bleeding Outcome: Not Progressing Patient continues with frank red blood and clots from rectum this shift.    Problem: Fluid Volume: Goal: Will show no signs and symptoms of excessive bleeding Outcome: Progressing Patient received 1 unit PRBC this shift - tolerated well.  Paged Dr. Lavetta Nielsen with results of am Hgb which was 7.5.  He is aware and good with that result.

## 2015-05-31 NOTE — Discharge Summary (Addendum)
[place] at [place] NAME: Taylor Watson    MR#:  [id number]  DATE OF BIRTH:  March 10, 1919  DATE OF ADMISSION:  05/29/2015 ADMITTING PHYSICIAN: Bettey Costa, MD  DATE OF DISCHARGE: 05/31/2015  PRIMARY CARE PHYSICIAN: [name], MD    ADMISSION DIAGNOSIS:  Symptomatic anemia [D64.9] Gastrointestinal hemorrhage, unspecified gastritis, unspecified gastrointestinal hemorrhage type [K92.2]  DISCHARGE DIAGNOSIS:  Active Problems:   GIB (gastrointestinal bleeding)   SECONDARY DIAGNOSIS:   Past Medical History  Diagnosis Date  . Hypertension   . CVA (cerebral vascular accident) ([place])   . Anemia   . Hypercholesterolemia   . GERD (gastroesophageal reflux disease)   . Allergy   . Hx: UTI (urinary tract infection)   . History of blood transfusion   . History of kidney stones     HOSPITAL COURSE:   82 year old female with past medical history of hypertension, history of previous CVA, hyperlipidemia, GERD, history of previous UTI presented to the hospital with rectal bleeding.  #1 GI bleed-lower GI bleed given her bloody bowel movements. Patient continues to have intermittent hematochezia.  - Hg. Down to 6.7 today and given one more unit and Hg. Improved and will cont. To monitor.  - given ongoing hematochezia I ordered a bleeding scan which is positive in the cecum/proximal ascending colon.   - Pt. Needs vascular/IR intervention for possible embolization and spoke to Vascular surgeon and IR on call who are not able to intervene here and therefore pt. Will need to be transferred to another hospital. I spoke to Dr. Loleta Books (Hospitalist) at [facility] who has accepted transfer to [facility].   - cont. To follow Hg. She will IR intervention possible embolization.    -Hold aspirin  #2. Acute blood loss anemia-due to GI bleed. -cont. Transfusions to keep Hg. > 7.  Hemodynamically stable.   #3 depression-continue Lexapro.  #4  hypertension-hold Norvasc give relative Hypotension.    DISCHARGE CONDITIONS:   Stable  CONSULTS OBTAINED:  Treatment Team:  Josefine Class, MD  DRUG ALLERGIES:   Allergies  Allergen Reactions  . Dyazide [Hydrochlorothiazide W-Triamterene]   . Monopril [Fosinopril]   . Toprol Xl [Metoprolol Tartrate]   . Verapamil     DISCHARGE MEDICATIONS:   Current Discharge Medication List    CONTINUE these medications which have NOT CHANGED   Details  escitalopram (LEXAPRO) 10 MG tablet TAKE ONE TABLET BY MOUTH EVERY DAY Qty: 30 tablet, Refills: 2    omeprazole (PRILOSEC) 20 MG capsule TAKE ONE CAPSULE BY MOUTH EVERY DAY Qty: 30 capsule, Refills: 5      STOP taking these medications     amLODipine (NORVASC) 2.5 MG tablet      aspirin 81 MG tablet      Brinzolamide-Brimonidine (SIMBRINZA) 1-0.2 % SUSP          DISCHARGE INSTRUCTIONS:   DIET:  NPO  DISCHARGE CONDITION:  Stable  ACTIVITY:  Activity as tolerated  OXYGEN:  Home Oxygen: No.   Oxygen Delivery: room air  DISCHARGE LOCATION:  [facility] when bed available.    If you experience worsening of your admission symptoms, develop shortness of breath, life threatening emergency, suicidal or homicidal thoughts you must seek medical attention immediately by calling 911 or calling your MD immediately  if symptoms less severe.  You Must read complete instructions/literature along with all the possible adverse reactions/side effects for all the Medicines you take and that have been prescribed to you.  Take any new Medicines after you have completely understood and accpet all the possible adverse reactions/side effects.   Please note  You were cared for by a hospitalist during your hospital stay. If you have any questions about your discharge medications or the care you received while you were in the hospital after you are discharged, you can call the unit and asked to speak with the hospitalist on call  if the hospitalist that took care of you is not available. Once you are discharged, your primary care physician will handle any further medical issues. Please note that NO REFILLS for any discharge medications will be authorized once you are discharged, as it is imperative that you return to your primary care physician (or establish a relationship with a primary care physician if you do not have one) for your aftercare needs so that they can reassess your need for medications and monitor your lab values.    CBC  Recent Labs Lab 05/31/15 0254  05/31/15 1649  WBC 6.8  --   --   HGB 7.5*  < > 7.7*  HCT 22.7*  --   --   PLT 168  --   --   < > = values in this interval not displayed.  Chemistries   Recent Labs Lab 05/29/15 1703  05/31/15 0254  NA 139  < > 139  K 4.1  < > 3.5  CL 105  < > 110  CO2 29  < > 26  GLUCOSE 108*  < > 124*  BUN 18  < > 20  CREATININE 0.81  < > 0.76  CALCIUM 9.0  < > 7.6*  AST 14*  --   --   ALT <5*  --   --   ALKPHOS 93  --   --   BILITOT 0.7  --   --   < > = values in this interval not displayed.  Cardiac Enzymes  Recent Labs Lab 05/29/15 2012  TROPONINI <0.03     RADIOLOGY:  Nm Gi Blood Loss  05/31/2015  CLINICAL DATA:  Patient with multiple episodes of blood within the stool. Week and fatigue. Low hemoglobin. EXAM: NUCLEAR MEDICINE GASTROINTESTINAL BLEEDING SCAN TECHNIQUE: Sequential abdominal images were obtained following intravenous administration of Tc-66m labeled red blood cells. RADIOPHARMACEUTICALS:  21.7 mCi Tc-77m in-vitro labeled red cells. COMPARISON:  None. FINDINGS: Radiotracer is demonstrated within the expected location of the cecum and ascending colon progressing distally into the transverse and proximal descending colon. Tracer is demonstrated within the vasculature as well as within the bladder. IMPRESSION: Positive bleeding examination, with origin likely within the region of the cecum/proximal ascending colon. Critical  Value/emergent results were called by telephone at the time of interpretation on 05/31/2015 at 7:08 pm to Dr. Abel Presto , who verbally acknowledged these results. Electronically Signed   By: Lovey Newcomer M.D.   On: 05/31/2015 19:09      Management plans discussed with the patient, family and they are in agreement.  CODE STATUS:     Code Status Orders        Start     Ordered   05/29/15 2141  Do not attempt resuscitation (DNR)   Continuous    Question Answer Comment  In the event of cardiac or respiratory ARREST Do not call a "code blue"   In the event of cardiac or respiratory ARREST Do not perform Intubation, CPR, defibrillation or ACLS   In the event of cardiac or respiratory ARREST Use medication  by any route, position, wound care, and other measures to relive pain and suffering. May use oxygen, suction and manual treatment of airway obstruction as needed for comfort.      05/29/15 2140    Code Status History    Date Active Date Inactive Code Status Order ID Comments User Context   This patient has a current code status but no historical code status.      TOTAL TIME TAKING CARE OF THIS PATIENT: 45 minutes.    [name] M.D on 05/31/2015 at 7:32 PM  Between 7am to 6pm - Pager - [phone]  After 6pm go to [email or web address] - password [email or web address] [facility]  Office  [phone]  CC: Primary care physician; [name], MD

## 2015-05-31 NOTE — Progress Notes (Signed)
Patient continues to have flank red blood with clotts from rectum; c/o of HA; MD made aware and hemoglobin lab ordered  once blood transfusion completed this shift and MD ordered bleeding scan; patient and family made aware

## 2015-05-31 NOTE — Progress Notes (Signed)
Taylor Watson NAME: Taylor Watson    MR#:  [id number]  DATE OF BIRTH:  [birth year]  SUBJECTIVE:   Patient here due to rectal bleeding. Hemoglobin down to 6.7 this morning. Had one episode of bloody bowel movement early this morning. No abdominal pain, nausea, vomiting.  REVIEW OF SYSTEMS:    Review of Systems  Constitutional: Negative for fever and chills.  HENT: Negative for congestion and tinnitus.   Eyes: Negative for blurred vision and double vision.  Respiratory: Negative for cough, shortness of breath and wheezing.   Cardiovascular: Negative for chest pain, orthopnea and PND.  Gastrointestinal: Positive for blood in stool. Negative for nausea, vomiting, abdominal pain and diarrhea.  Genitourinary: Negative for dysuria and hematuria.  Neurological: Negative for dizziness, sensory change and focal weakness.  All other systems reviewed and are negative.   Nutrition: Full liquid Tolerating Diet: Yes Tolerating PT: Await Eval.    DRUG ALLERGIES:   Allergies  Allergen Reactions  . Dyazide [Hydrochlorothiazide W-Triamterene]   . Monopril [Fosinopril]   . Toprol Xl [Metoprolol Tartrate]   . Verapamil     VITALS:  Blood pressure 157/46, pulse 92, temperature 97.8 F (36.6 C), temperature source Oral, resp. rate 20, height 5\' 5"  (1.651 m), weight 55.067 kg (121 lb 6.4 oz), SpO2 99 %.  PHYSICAL EXAMINATION:   Physical Exam  GENERAL:  82 y.o.-year-old patient lying in the bed with no acute distress.  EYES: Pupils equal, round, reactive to light and accommodation. No scleral icterus. Extraocular muscles intact.  HEENT: Head atraumatic, normocephalic. Oropharynx and nasopharynx clear.  NECK:  Supple, no jugular venous distention. No thyroid enlargement, no tenderness.  LUNGS: Normal breath sounds bilaterally, no wheezing, rales, rhonchi. No use of accessory muscles of respiration.  CARDIOVASCULAR: S1, S2 normal.   II/VI SEM at LSB, No rubs, or gallops.  ABDOMEN: Soft, nontender, nondistended. Bowel sounds present. No organomegaly or mass.  EXTREMITIES: No cyanosis, clubbing or edema b/l.    NEUROLOGIC: Cranial nerves II through XII are intact. No focal Motor or sensory deficits b/l.   PSYCHIATRIC: The patient is alert and oriented x 3.  SKIN: No obvious rash, lesion, or ulcer.    LABORATORY PANEL:   CBC  Recent Labs Lab 05/31/15 0254 05/31/15 0955  WBC 6.8  --   HGB 7.5* 6.7*  HCT 22.7*  --   PLT 168  --    ------------------------------------------------------------------------------------------------------------------  Chemistries   Recent Labs Lab 05/29/15 1703  05/31/15 0254  NA 139  < > 139  K 4.1  < > 3.5  CL 105  < > 110  CO2 29  < > 26  GLUCOSE 108*  < > 124*  BUN 18  < > 20  CREATININE 0.81  < > 0.76  CALCIUM 9.0  < > 7.6*  AST 14*  --   --   ALT <5*  --   --   ALKPHOS 93  --   --   BILITOT 0.7  --   --   < > = values in this interval not displayed. ------------------------------------------------------------------------------------------------------------------  Cardiac Enzymes  Recent Labs Lab 05/29/15 2012  TROPONINI <0.03   ------------------------------------------------------------------------------------------------------------------  RADIOLOGY:  No results found.   ASSESSMENT AND PLAN:   82 year old female with past medical history of hypertension, history of previous CVA, hyperlipidemia, GERD, history of previous UTI presented to the hospital with rectal bleeding.  #1 GI bleed-lower GI bleed given her bloody bowel  movements. Patient continues to have intermittent hematochezia.  - likely this is Diverticular in nature.  Hg. Down to 6.7 today and will given one more unit and follow Hg.  - hold on GI eval as pt. Is advanced age and likely not a candidate for intervention.   - if continues to bleed will get a Bleeding scan.  Follow Hg.  -Hold  aspirin  #2. Acute blood loss anemia-due to GI bleed. -We'll transfuse 1 more unit today is hemoglobin down to 6.7. Follow serial hemoglobins.  #3 depression-continue Lexapro.  #4 hypertension-hold Norvasc.     All the records are reviewed and case discussed with Care Management/Social Workerr. Management plans discussed with the patient, family and they are in agreement.  CODE STATUS: Full Code  DVT Prophylaxis: TED's and SCD's.   TOTAL TIME TAKING CARE OF THIS PATIENT: 25 minutes.   POSSIBLE D/C IN 1-2 DAYS, DEPENDING ON CLINICAL CONDITION.   [name] M.D on 05/31/2015 at 1:13 PM  Between 7am to 6pm - Pager - [phone]  After 6pm go to [email or web address] - password [email or web address] [facility]  Office  [phone]  CC: Primary care physician; [name], MD

## 2015-05-31 NOTE — Progress Notes (Signed)
GI Inpatient Follow-up Note  Patient Identification: DESMA MUNAFO is a 82 y.o. female with rectal bleeding.   Subjective:  More bleeding this am.  Hgb down to 6.7.   Somewhat confused.  Denies n/v, abd pain, melena.   Scheduled Inpatient Medications:  . escitalopram  10 mg Oral Daily  . pantoprazole  40 mg Oral Daily    Continuous Inpatient Infusions:   . sodium chloride 75 mL/hr at 05/31/15 0758    PRN Inpatient Medications:  acetaminophen **OR** acetaminophen, alum & mag hydroxide-simeth, ondansetron **OR** ondansetron (ZOFRAN) IV, senna-docusate  Review of Systems: Cannot obtain, confused,.    Physical Examination: BP 146/48 mmHg  Pulse 77  Temp(Src) 97.8 F (36.6 C) (Oral)  Resp 18  Ht 5\' 5"  (1.651 m)  Wt 55.067 kg (121 lb 6.4 oz)  BMI 20.20 kg/m2  SpO2 99% Gen: NAD, alert and oriented x 2 Neck: supple, no JVD or thyromegaly Chest: CTA bilaterally, no wheezes, crackles, or other adventitious sounds CV: RRR, no m/g/c/r Abd: soft, NT, ND, +BS in all four quadrants; no HSM, guarding, ridigity, or rebound tenderness Ext: no edema, well perfused with 2+ pulses, Skin: no rash or lesions noted Lymph: no LAD  Data: Lab Results  Component Value Date   WBC 6.8 05/31/2015   HGB 6.7* 05/31/2015   HCT 22.7* 05/31/2015   MCV 81.6 05/31/2015   PLT 168 05/31/2015    Recent Labs Lab 05/30/15 2049 05/31/15 0254 05/31/15 0955  HGB 7.0* 7.5* 6.7*   Lab Results  Component Value Date   NA 139 05/31/2015   K 3.5 05/31/2015   CL 110 05/31/2015   CO2 26 05/31/2015   BUN 20 05/31/2015   CREATININE 0.76 05/31/2015   GLU 94 11/06/2013   Lab Results  Component Value Date   ALT <5* 05/29/2015   AST 14* 05/29/2015   ALKPHOS 93 05/29/2015   BILITOT 0.7 05/29/2015   No results for input(s): APTT, INR, PTT in the last 168 hours.   Assessment/Plan: Ms. Frieling is a 82 y.o. female with rectal bleeding anemia.  Some more bleedign this am, hgb down to 6.7.    Recommendations: - agree with prbc - monitor Hgb - colonoscopy unlikley to change management and benefits still don't outweigh risks at this point.  - palliative care consult.   Please call with questions or concerns.  Tyberius Ryner, Grace Blight, MD

## 2015-06-01 ENCOUNTER — Inpatient Hospital Stay (HOSPITAL_COMMUNITY): Payer: Medicare Other

## 2015-06-01 ENCOUNTER — Encounter (HOSPITAL_COMMUNITY): Payer: Self-pay | Admitting: Internal Medicine

## 2015-06-01 DIAGNOSIS — F32A Depression, unspecified: Secondary | ICD-10-CM | POA: Diagnosis present

## 2015-06-01 DIAGNOSIS — K921 Melena: Secondary | ICD-10-CM

## 2015-06-01 DIAGNOSIS — F419 Anxiety disorder, unspecified: Secondary | ICD-10-CM

## 2015-06-01 DIAGNOSIS — K219 Gastro-esophageal reflux disease without esophagitis: Secondary | ICD-10-CM

## 2015-06-01 DIAGNOSIS — E7800 Pure hypercholesterolemia, unspecified: Secondary | ICD-10-CM

## 2015-06-01 DIAGNOSIS — D500 Iron deficiency anemia secondary to blood loss (chronic): Secondary | ICD-10-CM

## 2015-06-01 DIAGNOSIS — F329 Major depressive disorder, single episode, unspecified: Secondary | ICD-10-CM | POA: Diagnosis present

## 2015-06-01 DIAGNOSIS — R509 Fever, unspecified: Secondary | ICD-10-CM | POA: Diagnosis present

## 2015-06-01 DIAGNOSIS — R933 Abnormal findings on diagnostic imaging of other parts of digestive tract: Secondary | ICD-10-CM

## 2015-06-01 DIAGNOSIS — A419 Sepsis, unspecified organism: Secondary | ICD-10-CM

## 2015-06-01 LAB — BASIC METABOLIC PANEL
Anion gap: 5 (ref 5–15)
BUN: 13 mg/dL (ref 6–20)
CALCIUM: 7.6 mg/dL — AB (ref 8.9–10.3)
CO2: 21 mmol/L — AB (ref 22–32)
CREATININE: 0.84 mg/dL (ref 0.44–1.00)
Chloride: 111 mmol/L (ref 101–111)
GFR, EST NON AFRICAN AMERICAN: 58 mL/min — AB (ref >60)
Glucose, Bld: 92 mg/dL (ref 65–99)
Potassium: 3.6 mmol/L (ref 3.5–5.1)
Sodium: 137 mmol/L (ref 135–145)

## 2015-06-01 LAB — INFLUENZA PANEL BY PCR (TYPE A & B)
H1N1FLUPCR: NOT DETECTED
INFLAPCR: NEGATIVE
INFLBPCR: NEGATIVE

## 2015-06-01 LAB — PREPARE RBC (CROSSMATCH)

## 2015-06-01 LAB — CBC
HCT: 17.9 % — ABNORMAL LOW (ref 36.0–46.0)
HCT: 28 % — ABNORMAL LOW (ref 36.0–46.0)
HEMATOCRIT: 30.6 % — AB (ref 36.0–46.0)
HEMOGLOBIN: 6 g/dL — AB (ref 12.0–15.0)
HEMOGLOBIN: 10.3 g/dL — AB (ref 12.0–15.0)
HEMOGLOBIN: 8.6 g/dL — AB (ref 12.0–15.0)
MCH: 27.9 pg (ref 26.0–34.0)
MCH: 28.3 pg (ref 26.0–34.0)
MCH: 25.4 pg — AB (ref 26.0–34.0)
MCHC: 33.5 g/dL (ref 30.0–36.0)
MCHC: 33.7 g/dL (ref 30.0–36.0)
MCHC: 30.7 g/dL (ref 30.0–36.0)
MCV: 83.3 fL (ref 78.0–100.0)
MCV: 84.1 fL (ref 78.0–100.0)
MCV: 82.6 fL (ref 78.0–100.0)
Platelets: 147 10*3/uL — ABNORMAL LOW (ref 150–400)
Platelets: 114 10*3/uL — ABNORMAL LOW (ref 150–400)
Platelets: 114 10*3/uL — ABNORMAL LOW (ref 150–400)
RBC: 2.15 MIL/uL — AB (ref 3.87–5.11)
RBC: 3.64 MIL/uL — ABNORMAL LOW (ref 3.87–5.11)
RBC: 3.39 MIL/uL — ABNORMAL LOW (ref 3.87–5.11)
RDW: 15.1 % (ref 11.5–15.5)
RDW: 14 % (ref 11.5–15.5)
RDW: 14.1 % (ref 11.5–15.5)
WBC: 11.2 10*3/uL — AB (ref 4.0–10.5)
WBC: 14.1 10*3/uL — AB (ref 4.0–10.5)
WBC: 11 10*3/uL — ABNORMAL HIGH (ref 4.0–10.5)

## 2015-06-01 LAB — PROTIME-INR
INR: 1.38 (ref 0.00–1.49)
PROTHROMBIN TIME: 17.1 s — AB (ref 11.6–15.2)

## 2015-06-01 LAB — GLUCOSE, CAPILLARY: GLUCOSE-CAPILLARY: 88 mg/dL (ref 65–99)

## 2015-06-01 LAB — ABO/RH: ABO/RH(D): B POS

## 2015-06-01 LAB — LACTIC ACID, PLASMA
Lactic Acid, Venous: 2.4 mmol/L (ref 0.5–2.0)
Lactic Acid, Venous: 2.4 mmol/L (ref 0.5–2.0)

## 2015-06-01 LAB — MRSA PCR SCREENING: MRSA BY PCR: NEGATIVE

## 2015-06-01 LAB — APTT: aPTT: <20 seconds — ABNORMAL LOW (ref 24–37)

## 2015-06-01 LAB — PROCALCITONIN: Procalcitonin: <0.1 ng/mL

## 2015-06-01 MED ORDER — MIDAZOLAM HCL 2 MG/2ML IJ SOLN
INTRAMUSCULAR | Status: AC | PRN
  Administered 2015-06-01: 0.5 mg via INTRAVENOUS
  Administered 2015-06-01: 1 mg via INTRAVENOUS

## 2015-06-01 MED ORDER — ONDANSETRON HCL 4 MG/2ML IJ SOLN: 4.0000 mg | Freq: Four times a day (QID) | INTRAMUSCULAR | Status: DC | PRN

## 2015-06-01 MED ORDER — FENTANYL CITRATE (PF) 100 MCG/2ML IJ SOLN
INTRAMUSCULAR | Status: AC | PRN
  Administered 2015-06-01 (×2): 12.5 ug via INTRAVENOUS

## 2015-06-01 MED ORDER — SODIUM CHLORIDE 0.9 % IV SOLN
INTRAVENOUS | Status: DC
  Administered 2015-06-01: 02:00:00 via INTRAVENOUS

## 2015-06-01 MED ORDER — SODIUM CHLORIDE 0.9 % IV BOLUS (SEPSIS)
1000.0000 mL | Freq: Once | INTRAVENOUS | Status: AC
  Administered 2015-06-01: 1000 mL via INTRAVENOUS

## 2015-06-01 MED ORDER — DEXTROSE 5 % IV SOLN
250.0000 mg | INTRAVENOUS | Status: DC
  Administered 2015-06-02 – 2015-06-03 (×2): 250 mg via INTRAVENOUS
  Filled 2015-06-01 (×3): qty 250

## 2015-06-01 MED ORDER — SODIUM CHLORIDE 0.9 % IV BOLUS (SEPSIS)
1500.0000 mL | Freq: Once | INTRAVENOUS | Status: AC
  Administered 2015-06-01: 1000 mL via INTRAVENOUS

## 2015-06-01 MED ORDER — DEXTROSE 5 % IV SOLN
500.0000 mg | INTRAVENOUS | Status: DC
  Administered 2015-06-01: 500 mg via INTRAVENOUS
  Filled 2015-06-01: qty 500

## 2015-06-01 MED ORDER — POTASSIUM CHLORIDE IN NACL 40-0.9 MEQ/L-% IV SOLN
INTRAVENOUS | Status: AC
  Administered 2015-06-01 – 2015-06-02 (×2): 50 mL/h via INTRAVENOUS
  Filled 2015-06-01 (×3): qty 1000

## 2015-06-01 MED ORDER — ESCITALOPRAM OXALATE 10 MG PO TABS
10.0000 mg | ORAL_TABLET | Freq: Every day | ORAL | Status: DC
  Administered 2015-06-01 – 2015-06-05 (×5): 10 mg via ORAL
  Filled 2015-06-01 (×6): qty 1

## 2015-06-01 MED ORDER — PANTOPRAZOLE SODIUM 40 MG IV SOLR
40.0000 mg | Freq: Two times a day (BID) | INTRAVENOUS | Status: DC
  Administered 2015-06-01 – 2015-06-05 (×10): 40 mg via INTRAVENOUS
  Filled 2015-06-01 (×10): qty 40

## 2015-06-01 MED ORDER — FENTANYL CITRATE (PF) 100 MCG/2ML IJ SOLN
INTRAMUSCULAR | Status: AC
  Filled 2015-06-01: qty 2

## 2015-06-01 MED ORDER — IOHEXOL 300 MG/ML  SOLN
80.0000 mL | Freq: Once | INTRAMUSCULAR | Status: AC | PRN
  Administered 2015-06-01: 80 mL via INTRAVENOUS

## 2015-06-01 MED ORDER — LIDOCAINE HCL 1 % IJ SOLN
INTRAMUSCULAR | Status: AC
  Filled 2015-06-01: qty 20

## 2015-06-01 MED ORDER — IOHEXOL 300 MG/ML  SOLN
25.0000 mL | INTRAMUSCULAR | Status: AC
  Administered 2015-06-01 (×2): 25 mL via ORAL

## 2015-06-01 MED ORDER — IOHEXOL 300 MG/ML  SOLN
150.0000 mL | Freq: Once | INTRAMUSCULAR | Status: AC | PRN
  Administered 2015-06-01: 80 mL via INTRAVENOUS

## 2015-06-01 MED ORDER — SODIUM CHLORIDE 0.9 % IV SOLN
Freq: Once | INTRAVENOUS | Status: AC
  Administered 2015-06-01: 02:00:00 via INTRAVENOUS

## 2015-06-01 MED ORDER — MORPHINE SULFATE (PF) 2 MG/ML IV SOLN: 1.0000 mg | INTRAVENOUS | Status: DC | PRN

## 2015-06-01 MED ORDER — ACETAMINOPHEN 650 MG RE SUPP
650.0000 mg | Freq: Four times a day (QID) | RECTAL | Status: DC | PRN
Start: 2015-06-01 — End: 2015-06-03

## 2015-06-01 MED ORDER — DM-GUAIFENESIN ER 30-600 MG PO TB12: 1.0000 | ORAL_TABLET | Freq: Two times a day (BID) | ORAL | Status: DC | PRN

## 2015-06-01 MED ORDER — DEXTROSE 5 % IV SOLN
1.0000 g | INTRAVENOUS | Status: DC
  Administered 2015-06-01: 1 g via INTRAVENOUS
  Filled 2015-06-01: qty 10

## 2015-06-01 MED ORDER — ONDANSETRON HCL 4 MG PO TABS: 4.0000 mg | ORAL_TABLET | Freq: Four times a day (QID) | ORAL | Status: DC | PRN

## 2015-06-01 MED ORDER — ACETAMINOPHEN 325 MG PO TABS
650.0000 mg | ORAL_TABLET | Freq: Four times a day (QID) | ORAL | Status: DC | PRN
  Administered 2015-06-01 – 2015-06-02 (×2): 650 mg via ORAL
  Filled 2015-06-01 (×2): qty 2

## 2015-06-01 MED ORDER — SODIUM CHLORIDE 0.9% FLUSH
3.0000 mL | Freq: Two times a day (BID) | INTRAVENOUS | Status: DC
  Administered 2015-06-01 – 2015-06-05 (×10): 3 mL via INTRAVENOUS

## 2015-06-01 MED ORDER — MIDAZOLAM HCL 2 MG/2ML IJ SOLN
INTRAMUSCULAR | Status: AC
  Filled 2015-06-01: qty 2

## 2015-06-01 MED ORDER — DEXTROSE 5 % IV SOLN
1.0000 g | INTRAVENOUS | Status: DC
  Administered 2015-06-02 – 2015-06-03 (×2): 1 g via INTRAVENOUS
  Filled 2015-06-01 (×2): qty 10

## 2015-06-01 MED ORDER — IPRATROPIUM-ALBUTEROL 0.5-2.5 (3) MG/3ML IN SOLN: 3.0000 mL | RESPIRATORY_TRACT | Status: DC | PRN

## 2015-06-01 NOTE — Sedation Documentation (Signed)
Patient is resting comfortably. 

## 2015-06-01 NOTE — Consult Note (Signed)
GASTROENTEROLOGY CONSULTATION                                                              covering for Drs. Adriana Mccallum   Referring Provider: No ref. provider found Primary Care Physician:  [name], MD Primary Gastroenterologist:  Arther Dames, MD in [place]; unassigned  Reason for Consultation:  LGIB  HPI: Taylor Watson is a 82 y.o. female with PMH of hypertension, hyperlipidemia, GERD, CVA, kidney stone, allergy, anemia, who presents with rectal bleeding.  Per family, patient started having painless rectal bleeding on 05/29/15. She was initially admitted to [facility] and transferred to [facility] last night. On initial admission, she was found to have her hemoglobin dropped from 12.7 on 12/23/14 to 7.5. She was transfused with total of 3 units of blood. Last unit transfusion was finished at 4 PM per family. After transfusion, patient had another bowel movement with large amount of dark stool. Her hemoglobin dropped to 6.3 grams, but hemodynamically stable, blood pressure 91/41, which improved to 112/36 with IV fluid. She developed fever with T 101.  In [facility], GI was consulted, Dr. Rayann Heman, and thought pt is poor candidate for egd/colonoscopy due to advanced age, and recommended palliative care consult and CT abdomen/pelvis to exclude malignancy. Pt had NM tagged RBC study today, which showed positive bleeding examination, with origin likely within the region of the cecum/proximal ascending colon. IR intervention for possible embolization not available at [facility], therefore pt was transferred to [facility]. For IR embolization this AM.  CT scan abdomen and pelvis showed the following:  IMPRESSION: Filling defect within the cecum. This may be in part related incomplete distension although the possibility of a polypoid lesion would deserve consideration given the recent hemorrhage.  Diverticulosis without diverticulitis. Diverticular change may  be the etiology of the patient's right colon hemorrhage.  Chronic changes as described above.  Hgb this AM was 6 grams so is receiving 2 more units PRBC's.   Past Medical History  Diagnosis Date  . Hypertension   . CVA (cerebral vascular accident) ([place])   . Anemia   . Hypercholesterolemia   . GERD (gastroesophageal reflux disease)   . Allergy   . Hx: UTI (urinary tract infection)   . History of blood transfusion   . History of kidney stones   . Depression     Past Surgical History  Procedure Laterality Date  . Cholecystectomy  1984  . Neck lesion biopsy  2015    Followed by Dr. Richardson Landry    Prior to Admission medications   Medication Sig Start Date End Date Taking? Authorizing Provider  escitalopram (LEXAPRO) 10 MG tablet TAKE ONE TABLET BY MOUTH EVERY DAY 03/18/15   [name], MD  omeprazole (PRILOSEC) 20 MG capsule TAKE ONE CAPSULE BY MOUTH EVERY DAY 02/06/15   [name], MD    Current Facility-Administered Medications  Medication Dose Route Frequency Provider Last Rate Last Dose  . 0.9 %  sodium chloride infusion   Intravenous Continuous Thurnell Lose, MD 35 mL/hr at 06/01/15 0816    . acetaminophen (TYLENOL) tablet 650 mg  650 mg Oral Q6H PRN Ivor Costa, MD       Or  . acetaminophen (TYLENOL) suppository 650 mg  650 mg Rectal Q6H PRN Ivor Costa, MD      .  azithromycin (ZITHROMAX) 500 mg in dextrose 5 % 250 mL IVPB  500 mg Intravenous Q24H Ivor Costa, MD   500 mg at 06/01/15 0211  . cefTRIAXone (ROCEPHIN) 1 g in dextrose 5 % 50 mL IVPB  1 g Intravenous Q24H Ivor Costa, MD   1 g at 06/01/15 0153  . dextromethorphan-guaiFENesin (MUCINEX DM) 30-600 MG per 12 hr tablet 1 tablet  1 tablet Oral BID PRN Ivor Costa, MD      . escitalopram (LEXAPRO) tablet 10 mg  10 mg Oral Daily Ivor Costa, MD      . ipratropium-albuterol (DUONEB) 0.5-2.5 (3) MG/3ML nebulizer solution 3 mL  3 mL Nebulization Q4H PRN Ivor Costa, MD      . morphine 2 MG/ML injection 1 mg  1 mg Intravenous  Q4H PRN Ivor Costa, MD      . ondansetron [facility]) injection 4 mg  4 mg Intravenous Q6H PRN Ivor Costa, MD      . pantoprazole (PROTONIX) injection 40 mg  40 mg Intravenous Q12H Ivor Costa, MD   40 mg at 06/01/15 0156  . sodium chloride flush (NS) 0.9 % injection 3 mL  3 mL Intravenous Q12H Ivor Costa, MD   3 mL at 06/01/15 0154    Allergies as of 05/31/2015 - Review Complete 05/31/2015  Allergen Reaction Noted  . Dyazide [hydrochlorothiazide w-triamterene]  03/22/2012  . Monopril [fosinopril]  03/22/2012  . Toprol xl [metoprolol tartrate]  03/22/2012  . Verapamil  03/22/2012    Family History  Problem Relation Age of Onset  . Cancer Mother     unknown type  . Hypertension Mother   . Cancer Father     unknown type  . Breast cancer Daughter   . Colon cancer      grandfather    Social History   Social History  . Marital Status: Widowed    Spouse Name: N/A  . Number of Children: 5  . Years of Education: N/A   Occupational History  . Not on file.   Social History Main Topics  . Smoking status: Former Research scientist (life sciences)  . Smokeless tobacco: Former Systems developer    Types: Chew  . Alcohol Use: No  . Drug Use: No  . Sexual Activity: Not Currently   Other Topics Concern  . Not on file   Social History Narrative    Review of Systems: Ten point ROS is O/W negative except as mentioned in HPI.  Physical Exam: Vital signs in last 24 hours: Temp:  [97.7 F (36.5 C)-101 F (38.3 C)] 98 F (36.7 C) (01/29 0815) Pulse Rate:  [48-149] 72 (01/29 0637) Resp:  [7-30] 16 (01/29 0637) BP: (65-157)/(31-93) 137/72 mmHg (01/29 0815) SpO2:  [95 %-100 %] 100 % (01/29 [id number]) Weight:  [126 lb 1.7 oz (57.2 kg)] 126 lb 1.7 oz (57.2 kg) (01/28 2331) Last BM Date: 05/31/15 General:  Alert, Well-developed, well-nourished, pleasant and cooperative in NAD Head:  Normocephalic and atraumatic. Eyes:  Sclera clear, no icterus.  Conjunctiva pink. Ears:  Normal auditory acuity. Mouth:  No deformity or lesions.    Lungs:  Clear throughout to auscultation.  No wheezes, crackles, or rhonchi.  Heart:  Regular rate and rhythm; no murmurs, clicks, rubs, or gallops. Abdomen:  Soft, non-distended.  BS present.  Non-tender.   Rectal:  Deferred  Msk:  Symmetrical without gross deformities. Pulses:  Normal pulses noted. Extremities:  Without clubbing or edema. Neurologic:  Alert and  oriented x4;  grossly normal neurologically. Skin:  Intact without  significant lesions or rashes. Psych:  Alert and cooperative. Normal mood and affect.  Intake/Output from previous day: 01/28 0701 - 01/29 0700 In: 2193.8 [P.O.:1000; I.V.:558.8; Blood:335; IV Piggyback:300] Out: -   Lab Results:  Recent Labs  05/30/15 0554 05/30/15 2049 05/31/15 0254  05/31/15 1649 05/31/15 2135 06/01/15 0152  WBC 4.1  --  6.8  --   --   --  11.2*  HGB 7.0* 7.0* 7.5*  < > 7.7* 6.3* 6.0*  HCT 21.6* 22.0* 22.7*  --   --   --  17.9*  PLT 196  --  168  --   --   --  147*  < > = values in this interval not displayed. BMET  Recent Labs  05/29/15 1703 05/30/15 0554 05/31/15 0254  NA 139 135 139  K 4.1 3.5 3.5  CL 105 106 110  CO2 29 25 26   GLUCOSE 108* 114* 124*  BUN 18 19 20   CREATININE 0.81 0.83 0.76  CALCIUM 9.0 7.7* 7.6*   LFT  Recent Labs  05/29/15 1703  PROT 6.9  ALBUMIN 3.6  AST 14*  ALT <5*  ALKPHOS 93  BILITOT 0.7   PT/INR  Recent Labs  06/01/15 0252  LABPROT 17.1*  INR 1.38   Studies/Results: Nm Gi Blood Loss  05/31/2015  CLINICAL DATA:  Patient with multiple episodes of blood within the stool. Week and fatigue. Low hemoglobin. EXAM: NUCLEAR MEDICINE GASTROINTESTINAL BLEEDING SCAN TECHNIQUE: Sequential abdominal images were obtained following intravenous administration of Tc-13m labeled red blood cells. RADIOPHARMACEUTICALS:  21.7 mCi Tc-93m in-vitro labeled red cells. COMPARISON:  None. FINDINGS: Radiotracer is demonstrated within the expected location of the cecum and ascending colon progressing  distally into the transverse and proximal descending colon. Tracer is demonstrated within the vasculature as well as within the bladder. IMPRESSION: Positive bleeding examination, with origin likely within the region of the cecum/proximal ascending colon. Critical Value/emergent results were called by telephone at the time of interpretation on 05/31/2015 at 7:08 pm to Dr. Abel Presto , who verbally acknowledged these results. Electronically Signed   By: Lovey Newcomer M.D.   On: 05/31/2015 19:09   Ct Abdomen Pelvis W Contrast  06/01/2015  CLINICAL DATA:  Colonic GI bleed EXAM: CT ABDOMEN AND PELVIS WITH CONTRAST TECHNIQUE: Multidetector CT imaging of the abdomen and pelvis was performed using the standard protocol following bolus administration of intravenous contrast. CONTRAST:  47mL OMNIPAQUE IOHEXOL 300 MG/ML  SOLN COMPARISON:  05/31/2015 FINDINGS: Lung bases demonstrate bibasilar atelectasis without sizable effusions. Gallbladder is been surgically removed. The liver demonstrates a 2.2 cmhepatic cyst in the left lobe stable from the prior exam. The spleen, adrenal glands and pancreas are within normal limits with the exception of some mild calcifications within the pancreas which are stable in appearance from the prior exam. The kidneys demonstrate a normal enhancement pattern. Prominent extrarenal pelves are noted bilaterally. These findings are stable from the prior exam. No obstructive changes are seen. Bladder is well distended. The colon demonstrates diffuse diverticular change without evidence of diverticulitis. The appendix is filled with barium. The ascending colon demonstrates diverticular change which may be the etiology of the patient's underlying hemorrhage. Additionally a filling defect is noted within the barium in the cecum in the possibility of an underlying polypoid lesion cannot be totally excluded. Diffuse aortoiliac calcifications are noted without aneurysmal dilatation. Calcified uterine  fibroids are seen. The osseous structures show no acute abnormality. IMPRESSION: Filling defect within the cecum. This may be in  part related incomplete distension although the possibility of a polypoid lesion would deserve consideration given the recent hemorrhage. Diverticulosis without diverticulitis. Diverticular change may be the etiology of the patient's right colon hemorrhage. Chronic changes as described above. Electronically Signed   By: Inez Catalina M.D.   On: 06/01/2015 07:30   Dg Chest Port 1 View  06/01/2015  CLINICAL DATA:  Gastrointestinal bleeding EXAM: PORTABLE CHEST 1 VIEW COMPARISON:  10/22/2013 FINDINGS: Heart is upper limits normal in size. Increasing bibasilar airspace opacities, likely atelectasis. Possible small left effusion. No acute bony abnormality. IMPRESSION: Increasing bibasilar opacities, likely atelectasis. Question small left effusion. Electronically Signed   By: Rolm Baptise M.D.   On: 06/01/2015 07:07   IMPRESSION/PLAN:  -82 year old female who presented with severe GI bleed.  Nuc med bleeding scan positive to right colon; CT scan shows right sided diverticulosis and also possible right sided filling defect (?underdistention or lesion).  IR for angio this AM. -Acute blood loss anemia:  Hgb down 6.5 grams compared to 5 months ago.  Receiving PRBC's currently for Hgb of 6 grams.  This will be 5 units so far for this admission (3 at [facility] and 2 here).    ZEHR, JESSICA D.  06/01/2015, 8:50 AM  Pager number [phone]     Attending physician's note   I have taken a history, examined the patient and reviewed the chart. I agree with the Advanced Practitioner's note, impression and recommendations. 82 year old female with a major LGI bleed localized to cecum/ascending colon by nuc med scan. CT scan shows a possible filling defect in the cecum and diffuse diverticulosis. Marked ABL anemia. Angiogram is scheduled today. Transfuse to keep Hb >8. Consider colonoscopy when  acute bleeding has stopped. Drs. Collene Mares and Benson Norway to assume GI care on Monday.   Lucio Edward, MD [facility] [phone] Mon-Fri 8a-5p [phone] after 5p, weekends, holidays

## 2015-06-01 NOTE — Sedation Documentation (Signed)
Patient is resting comfortably. Will sedate pt lightly due to age, and monitor from there

## 2015-06-01 NOTE — Progress Notes (Addendum)
CRITICAL VALUE ALERT  Critical value received: Lactic acid 2.4, Hgb 6.0  Date of notification:  06/01/2015   Time of notification:  4:00 AM   Critical value read back: yes  Nurse who received alert:  Reatha Armour  MD notified (1st page):    Time of first page:    MD notified (2nd page):  Time of second page:  Responding MD:  Dr. Blaine Hamper  Time MD responded:  0401  See Epic for orders

## 2015-06-01 NOTE — H&P (Addendum)
[facility] History and Physical  Taylor Watson [id number] DOB: [birth year] DOA: 05/31/2015  Referring physician: ED physician PCP: [name], MD  Specialists:   Chief Complaint: Rectal bleeding and fever  HPI: Taylor Watson is a 82 y.o. female with PMH of hypertension, hyperlipidemia, GERD, CVA, kidney stone, allergy, anemia, who presents with appendicitis rectal bleeding.  Per family, patient started having painless rectal bleeding on 05/29/15. She was initially admitted to [place] and transferred to [facility]. On initial admission, she was found to have her hemoglobin dropped from 12.7 on 12/23/14 to 7.5. She was transfused with total of 3 units of blood. Last unit transfusion was finished at 4 PM per family. After transfusion, patient had another bowel movement with large amount of dark stool. Her hemoglobin dropped to 6.3, but hemodynamically stable, blood pressure 91/41, which improved to 112/36 with IV fluid. She developed fever with T 101. Patient does not have chest pain, abdominal pain, unilateral weakness. She has mild dry cough which is a chronic issue per family, no SOB. She does not have symptoms of UTI. She is mildly confused. She is oriented to person and place, but not time.   In [place], GI was consulted and thought pt is poor candidate for colonoscopy due to advanced age, and recommended to get CT a/p to exclude malignancy. Pt had NM tagged RBC study today, which showed positive bleeding examination, with origin likely within the region of the cecum/proximal ascending colon. Pt may need vascular/IR intervention for possible embolization, which cannot be done in [place], therefore pt is transferred to [facility].  Patient is admitted to inpatient for further evaluation and treatment. IR was consulted.  EKG on 05/30/15: Independently reviewed. QTC 479, LAD, PA-C, poor R-wave progression, septal infarct pattern  Where does patient  live?   At home  Can patient participate in ADLs?  None  Review of Systems:   General: has fevers, no chills, no changes in body weight, has fatigue HEENT: no blurry vision, hearing changes or sore throat Pulm: no dyspnea, has coughing, no wheezing CV: no chest pain, palpitations Abd: no nausea, vomiting, abdominal pain, diarrhea, constipation. Has rectal bleeding. GU: no dysuria, burning on urination, increased urinary frequency, hematuria  Ext: no leg edema Neuro: no unilateral weakness, numbness, or tingling, no vision change or hearing loss Skin: no rash MSK: No muscle spasm, no deformity, no limitation of range of movement in spin Heme: No easy bruising.  Travel history: No recent long distant travel.  Allergy:  Allergies  Allergen Reactions  . Dyazide [Hydrochlorothiazide W-Triamterene]   . Monopril [Fosinopril]   . Toprol Xl [Metoprolol Tartrate]   . Verapamil     Past Medical History  Diagnosis Date  . Hypertension   . CVA (cerebral vascular accident) ([place])   . Anemia   . Hypercholesterolemia   . GERD (gastroesophageal reflux disease)   . Allergy   . Hx: UTI (urinary tract infection)   . History of blood transfusion   . History of kidney stones   . Depression     Past Surgical History  Procedure Laterality Date  . Cholecystectomy  1984  . Neck lesion biopsy  2015    Followed by Dr. Richardson Landry    Social History:  reports that she has quit smoking. She has quit using smokeless tobacco. Her smokeless tobacco use included Chew. She reports that she does not drink alcohol or use illicit drugs.  Family History:  Family History  Problem Relation Age  of Onset  . Cancer Mother     unknown type  . Hypertension Mother   . Cancer Father     unknown type  . Breast cancer Daughter   . Colon cancer      grandfather     Prior to Admission medications   Medication Sig Start Date End Date Taking? Authorizing Provider  escitalopram (LEXAPRO) 10 MG tablet TAKE ONE  TABLET BY MOUTH EVERY DAY 03/18/15   [name], MD  omeprazole (PRILOSEC) 20 MG capsule TAKE ONE CAPSULE BY MOUTH EVERY DAY 02/06/15   [name], MD    Physical Exam: Filed Vitals:   05/31/15 2300 05/31/15 2315 05/31/15 2330 05/31/15 2331  BP: 124/70 91/46 112/38   Pulse: 119 99 89   Temp:   99.8 F (37.7 C)   TempSrc:   Oral   Resp: 23 17 20    Height:    5\' 5"  (1.651 m)  Weight:    57.2 kg (126 lb 1.7 oz)  SpO2: 97% 98% 99%    General: Not in acute distress. HEENT:       Eyes: PERRL, EOMI, no scleral icterus.       ENT: No discharge from the ears and nose, no pharynx injection, no tonsillar enlargement.        Neck: No JVD, no bruit, no mass felt. Heme: No neck lymph node enlargement. Cardiac: S1/S2, RRR, No murmurs, No gallops or rubs. Pulm: No rales, wheezing, rhonchi or rubs. Abd: Soft, nondistended, nontender, no rebound pain, no organomegaly, BS present. Ext: No pitting leg edema bilaterally. 2+DP/PT pulse bilaterally. Musculoskeletal: No joint deformities, No joint redness or warmth, no limitation of ROM in spin. Skin: No rashes.  Neuro: drowsy, oriented to place and person, but not to time, cranial nerves II-XII grossly intact, moves all extremities.  Psych: Patient is not psychotic, no suicidal or hemocidal ideation.  Labs on Admission:  Basic Metabolic Panel:  Recent Labs Lab 05/28/15 1300 05/29/15 1703 05/30/15 0554 05/31/15 0254  NA 140 139 135 139  K 3.7 4.1 3.5 3.5  CL 101 105 106 110  CO2 32 29 25 26   GLUCOSE 87 108* 114* 124*  BUN 8 18 19 20   CREATININE 0.73 0.81 0.83 0.76  CALCIUM 9.4 9.0 7.7* 7.6*   Liver Function Tests:  Recent Labs Lab 05/28/15 1300 05/29/15 1703  AST 10 14*  ALT 4 <5*  ALKPHOS 105 93  BILITOT 0.9 0.7  PROT 7.0 6.9  ALBUMIN 3.9 3.6   No results for input(s): LIPASE, AMYLASE in the last 168 hours. No results for input(s): AMMONIA in the last 168 hours. CBC:  Recent Labs Lab 05/29/15 1703  05/30/15 0554  05/30/15 2049 05/31/15 0254 05/31/15 0955 05/31/15 1649 05/31/15 2135  WBC 5.7  --  4.1  --  6.8  --   --   --   HGB 10.8*  < > 7.0* 7.0* 7.5* 6.7* 7.7* 6.3*  HCT 33.9*  --  21.6* 22.0* 22.7*  --   --   --   MCV 81.4  --  81.0  --  81.6  --   --   --   PLT 271  --  196  --  168  --   --   --   < > = values in this interval not displayed. Cardiac Enzymes:  Recent Labs Lab 05/29/15 2012  TROPONINI <0.03    BNP (last 3 results) No results for input(s): BNP in the last 8760 hours.  ProBNP (last 3 results) No results for input(s): PROBNP in the last 8760 hours.  CBG: No results for input(s): GLUCAP in the last 168 hours.  Radiological Exams on Admission: Nm Gi Blood Loss  05/31/2015  CLINICAL DATA:  Patient with multiple episodes of blood within the stool. Week and fatigue. Low hemoglobin. EXAM: NUCLEAR MEDICINE GASTROINTESTINAL BLEEDING SCAN TECHNIQUE: Sequential abdominal images were obtained following intravenous administration of Tc-25m labeled red blood cells. RADIOPHARMACEUTICALS:  21.7 mCi Tc-47m in-vitro labeled red cells. COMPARISON:  None. FINDINGS: Radiotracer is demonstrated within the expected location of the cecum and ascending colon progressing distally into the transverse and proximal descending colon. Tracer is demonstrated within the vasculature as well as within the bladder. IMPRESSION: Positive bleeding examination, with origin likely within the region of the cecum/proximal ascending colon. Critical Value/emergent results were called by telephone at the time of interpretation on 05/31/2015 at 7:08 pm to Dr. Abel Presto , who verbally acknowledged these results. Electronically Signed   By: Lovey Newcomer M.D.   On: 05/31/2015 19:09    Assessment/Plan Principal Problem:   Rectal bleeding Active Problems:   Anxiety   Essential hypertension, benign   GERD (gastroesophageal reflux disease)   Sepsis (HCC)   Blood loss anemia   Depression   Fever,  unspecified   Rectal bleeding: Tagged RBC study showed positive bleeding with origin likely within the region of the cecum/proximal ascending colon. Not candidate for colonoscopy per GI. Hemoglobin dropped again to 6.3 after transfused 3 units of blood. IR, Dr. Reesa Chew  was consulted, who recommended to wait for CT-ab/pelvis to r/o malignance. Currently patient is hemodynamically stable, recent blood pressure is 112/38.    -Will admit to SDU - NPO - IVF: 1L NS, then 100 cc/h - Start IV pantoprazole 40 mg bib - Zofran IV for nausea - Avoid NSAIDs and SQ heparin - Maintain IV access (2 large bore IVs if possible). - Monitor closely and follow q6h cbc, transfuse as necessary. - INR/PTT/type & screen - transfer 2U of blood now. - CT-abd/pelvis with contrast  Sepsis vs. SIRS: Patient meets criteria of sepsis, with leukocytosis, tachycardia, tachypnea and fever. Etiology is not clear. Differential diagnosis include blood transfusion, upper respiratory infection given cough and UTI.  -will start empiric antibiotics: IV Rocephin and azithromycin -will get Procalcitonin and trend lactic acid levels per sepsis protocol. -IVF: 1L of NS bolus in ED, followed by 100 cc/h -CXR, UA, flu pcr and Blood culture  Addendum: Lactic acid comes back elevated at 2.4--> will 1.5 L normal saline bolus now, this will add up to total of 2.5 L normal saline bolus, followed by 100 mL per hour.  HTN: Not on meds at home. bp is soft due to GIB. -Monitor bp closely  GERD: -On IV protonix  Depression and anxiety: Stable, no suicidal or homicidal ideations. -Continue home medications: Celexa  DVT ppx: SCD  Code Status: DNR ( Family Communication:  Yes, patient's  daughter, grandson, grand grandson   at bed side Disposition Plan: Admit to inpatient   Date of Service 06/01/2015    Ivor Costa [facility] Pager [phone]  If 7PM-7AM, please contact night-coverage [email or web address] Password  [email or web address] 06/01/2015, 2:11 AM

## 2015-06-01 NOTE — Sedation Documentation (Signed)
exoseal placed to R groin, pt very agitated and combative, small hematomA NOTED-md aware.

## 2015-06-01 NOTE — Progress Notes (Signed)
Patient power of attorney per family daughter Taylor Watson gave permission with family present in the room to make her a Do Not Resuscitate status.Marland Kitchen

## 2015-06-01 NOTE — Procedures (Signed)
S/p SMA angio Neg for right colon active bleed No comp Stable Full report in PACS

## 2015-06-01 NOTE — Progress Notes (Signed)
PT Cancellation Note  Patient Details Name: Taylor Watson MRN: [id number] DOB: [birth year]   Cancelled Treatment:    Reason Eval/Treat Not Completed: Medical issues which prohibited therapy (Pt with active bleed and going for angiogram/embolization.)Nurse asked PT to HOLD today.  Will check back tomorrow. Thanks.    Irwin Brakeman F 06/01/2015, 10:16 AM 06/01/2015  [facility] [phone] (pager)

## 2015-06-01 NOTE — Progress Notes (Addendum)
Patient Demographics:    Taylor Watson, is a 82 y.o. female, DOB - 16-Feb-1919, [id number]  Admit date - 05/31/2015   Admitting Physician Ivor Costa, MD  Outpatient Primary MD for the patient is [name], MD  LOS - 1   No chief complaint on file.       Subjective:    Taylor Watson today has, No headache, No chest pain, No abdominal pain - No Nausea, No new weakness tingling or numbness, No Cough - SOB.    Assessment  & Plan :     1. Ongoing lower GI bleed with positive tagged RBC scan showing ascending colon bleed, blood loss rated anemia due to the same. For now transfused to keep hemoglobin above 7.5, monitor H&H, on PPI, GI on board. I are to attempt embolization on 06/01/2015. She has had some unintentional weight loss to question underlying malignancy. However patient wishes general medical care and no surgery chemoradiation.   2. Mild underlying depression. On Lexapro at home continue.   3. Elevated lactic acid. Due to blood loss and hypotension. Currently not septic, there was question of sepsis upon admission which is unclear to me. Monitor cultures. Has bibasilar atelectasis and UA is pending. She was febrile when she was admitted. Or now continue Rocephin and azithromycin and monitor.    Code Status : DO NOT RESUSCITATE  Family Communication  : None present  Disposition Plan  : Remain in stepdown  Consults  : GI, IR  Procedures  :   Tagged RBC scan shows active bleeding at the ascending colon.  Embolization by IR to be attempted for lower GI bleed on 06/01/2015   DVT Prophylaxis  :    SCDs    Lab Results  Component Value Date   PLT 147* 06/01/2015    Inpatient Medications  Scheduled Meds: . azithromycin  500 mg Intravenous Q24H  . cefTRIAXone  (ROCEPHIN)  IV  1 g Intravenous Q24H  . escitalopram  10 mg Oral Daily  . lidocaine      . pantoprazole (PROTONIX) IV  40 mg Intravenous Q12H  . sodium chloride flush  3 mL Intravenous Q12H   Continuous Infusions: . sodium chloride 35 mL/hr at 06/01/15 0816   PRN Meds:.acetaminophen **OR** acetaminophen, dextromethorphan-guaiFENesin, ipratropium-albuterol, morphine injection, [DISCONTINUED] ondansetron **OR** ondansetron (ZOFRAN) IV  Antibiotics  :    Anti-infectives    Start     Dose/Rate Route Frequency Ordered Stop   06/01/15 0130  cefTRIAXone (ROCEPHIN) 1 g in dextrose 5 % 50 mL IVPB     1 g 100 mL/hr over 30 Minutes Intravenous Every 24 hours 06/01/15 0120     06/01/15 0130  azithromycin (ZITHROMAX) 500 mg in dextrose 5 % 250 mL IVPB     500 mg 250 mL/hr over 60 Minutes Intravenous Every 24 hours 06/01/15 0120          Objective:   Filed Vitals:   06/01/15 0630 06/01/15 0637 06/01/15 0815 06/01/15 0854  BP: 123/49 134/47 137/72 133/63  Pulse: 72 72    Temp: 98.1 F (36.7 C)  98 F (36.7 C) 97.7 F (36.5 C)  TempSrc: Oral  Oral Oral  Resp: 17 16    Height:      Weight:  SpO2: 100% 100%      Wt Readings from Last 3 Encounters:  05/31/15 57.2 kg (126 lb 1.7 oz)  05/29/15 55.067 kg (121 lb 6.4 oz)  05/28/15 56.813 kg (125 lb 4 oz)     Intake/Output Summary (Last 24 hours) at 06/01/15 1011 Last data filed at 06/01/15 0950  Gross per 24 hour  Intake 2196.83 ml  Output      0 ml  Net 2196.83 ml     Physical Exam  Awake Alert, Oriented X 3, No new F.N deficits, Normal affect [place].AT,PERRAL Supple Neck,No JVD, No cervical lymphadenopathy appriciated.  Symmetrical Chest wall movement, Good air movement bilaterally, CTAB RRR,No Gallops,Rubs or new Murmurs, No Parasternal Heave +ve B.Sounds, Abd Soft, No tenderness, No organomegaly appriciated, No rebound - guarding or rigidity. No Cyanosis, Clubbing or edema, No new Rash or bruise       Data Review:    Micro Results Recent Results (from the past 240 hour(s))  MRSA PCR Screening     Status: None   Collection Time: 05/31/15 11:43 PM  Result Value Ref Range Status   MRSA by PCR NEGATIVE NEGATIVE Final    Comment:        The GeneXpert MRSA Assay (FDA approved for NASAL specimens only), is one component of a comprehensive MRSA colonization surveillance program. It is not intended to diagnose MRSA infection nor to guide or monitor treatment for MRSA infections.   Culture, blood (x 2)     Status: None (Preliminary result)   Collection Time: 06/01/15  2:59 AM  Result Value Ref Range Status   Specimen Description BLOOD RIGHT HAND  Final   Special Requests BOTTLES DRAWN AEROBIC AND ANAEROBIC 5CC  Final   Culture PENDING  Incomplete   Report Status PENDING  Incomplete  Culture, blood (x 2)     Status: None (Preliminary result)   Collection Time: 06/01/15  4:31 AM  Result Value Ref Range Status   Specimen Description BLOOD RIGHT ARM  Final   Special Requests BOTTLES DRAWN AEROBIC AND ANAEROBIC 5CC  Final   Culture PENDING  Incomplete   Report Status PENDING  Incomplete    Radiology Reports Nm Gi Blood Loss  05/31/2015  CLINICAL DATA:  Patient with multiple episodes of blood within the stool. Week and fatigue. Low hemoglobin. EXAM: NUCLEAR MEDICINE GASTROINTESTINAL BLEEDING SCAN TECHNIQUE: Sequential abdominal images were obtained following intravenous administration of Tc-76m labeled red blood cells. RADIOPHARMACEUTICALS:  21.7 mCi Tc-46m in-vitro labeled red cells. COMPARISON:  None. FINDINGS: Radiotracer is demonstrated within the expected location of the cecum and ascending colon progressing distally into the transverse and proximal descending colon. Tracer is demonstrated within the vasculature as well as within the bladder. IMPRESSION: Positive bleeding examination, with origin likely within the region of the cecum/proximal ascending colon. Critical Value/emergent results were  called by telephone at the time of interpretation on 05/31/2015 at 7:08 pm to Dr. Abel Presto , who verbally acknowledged these results. Electronically Signed   By: Lovey Newcomer M.D.   On: 05/31/2015 19:09   Ct Abdomen Pelvis W Contrast  06/01/2015  CLINICAL DATA:  Colonic GI bleed EXAM: CT ABDOMEN AND PELVIS WITH CONTRAST TECHNIQUE: Multidetector CT imaging of the abdomen and pelvis was performed using the standard protocol following bolus administration of intravenous contrast. CONTRAST:  13mL OMNIPAQUE IOHEXOL 300 MG/ML  SOLN COMPARISON:  05/31/2015 FINDINGS: Lung bases demonstrate bibasilar atelectasis without sizable effusions. Gallbladder is been surgically removed. The liver demonstrates a 2.2 cmhepatic cyst  in the left lobe stable from the prior exam. The spleen, adrenal glands and pancreas are within normal limits with the exception of some mild calcifications within the pancreas which are stable in appearance from the prior exam. The kidneys demonstrate a normal enhancement pattern. Prominent extrarenal pelves are noted bilaterally. These findings are stable from the prior exam. No obstructive changes are seen. Bladder is well distended. The colon demonstrates diffuse diverticular change without evidence of diverticulitis. The appendix is filled with barium. The ascending colon demonstrates diverticular change which may be the etiology of the patient's underlying hemorrhage. Additionally a filling defect is noted within the barium in the cecum in the possibility of an underlying polypoid lesion cannot be totally excluded. Diffuse aortoiliac calcifications are noted without aneurysmal dilatation. Calcified uterine fibroids are seen. The osseous structures show no acute abnormality. IMPRESSION: Filling defect within the cecum. This may be in part related incomplete distension although the possibility of a polypoid lesion would deserve consideration given the recent hemorrhage. Diverticulosis without  diverticulitis. Diverticular change may be the etiology of the patient's right colon hemorrhage. Chronic changes as described above. Electronically Signed   By: Inez Catalina M.D.   On: 06/01/2015 07:30   Dg Chest Port 1 View  06/01/2015  CLINICAL DATA:  Gastrointestinal bleeding EXAM: PORTABLE CHEST 1 VIEW COMPARISON:  10/22/2013 FINDINGS: Heart is upper limits normal in size. Increasing bibasilar airspace opacities, likely atelectasis. Possible small left effusion. No acute bony abnormality. IMPRESSION: Increasing bibasilar opacities, likely atelectasis. Question small left effusion. Electronically Signed   By: Rolm Baptise M.D.   On: 06/01/2015 07:07     CBC  Recent Labs Lab 05/29/15 1703  05/30/15 0554 05/30/15 2049 05/31/15 0254 05/31/15 0955 05/31/15 1649 05/31/15 2135 06/01/15 0152  WBC 5.7  --  4.1  --  6.8  --   --   --  11.2*  HGB 10.8*  < > 7.0* 7.0* 7.5* 6.7* 7.7* 6.3* 6.0*  HCT 33.9*  --  21.6* 22.0* 22.7*  --   --   --  17.9*  PLT 271  --  196  --  168  --   --   --  147*  MCV 81.4  --  81.0  --  81.6  --   --   --  83.3  MCH 25.9*  --  26.2  --  26.9  --   --   --  27.9  MCHC 31.8*  --  32.3  --  33.0  --   --   --  33.5  RDW 16.0*  --  15.9*  --  14.9*  --   --   --  15.1  < > = values in this interval not displayed.  Chemistries   Recent Labs Lab 05/28/15 1300 05/29/15 1703 05/30/15 0554 05/31/15 0254  NA 140 139 135 139  K 3.7 4.1 3.5 3.5  CL 101 105 106 110  CO2 32 29 25 26   GLUCOSE 87 108* 114* 124*  BUN 8 18 19 20   CREATININE 0.73 0.81 0.83 0.76  CALCIUM 9.4 9.0 7.7* 7.6*  AST 10 14*  --   --   ALT 4 <5*  --   --   ALKPHOS 105 93  --   --   BILITOT 0.9 0.7  --   --    ------------------------------------------------------------------------------------------------------------------ No results for input(s): CHOL, HDL, LDLCALC, TRIG, CHOLHDL, LDLDIRECT in the last 72 hours.  Lab Results  Component Value Date  HGBA1C 5.7 10/04/2013    ------------------------------------------------------------------------------------------------------------------ No results for input(s): TSH, T4TOTAL, T3FREE, THYROIDAB in the last 72 hours.  Invalid input(s): FREET3 ------------------------------------------------------------------------------------------------------------------  Recent Labs  05/30/15 0555  VITAMINB12 127*  FOLATE 10.2  FERRITIN 12  TIBC 317  IRON 28  RETICCTPCT 0.9    Coagulation profile  Recent Labs Lab 06/01/15 0252  INR 1.38    No results for input(s): DDIMER in the last 72 hours.  Cardiac Enzymes  Recent Labs Lab 05/29/15 2012  TROPONINI <0.03   ------------------------------------------------------------------------------------------------------------------ No results found for: BNP  Time Spent in minutes  35   SINGH,PRASHANT K M.D on 06/01/2015 at 10:11 AM  Between 7am to 7pm - Pager - [phone]  After 7pm go to [email or web address] - password [facility]  [facility] -  Office  [phone]

## 2015-06-01 NOTE — Progress Notes (Signed)
Chief Complaint: Patient was seen in consultation today for GI bleed at the request of Dr. Arther Dames  Referring Physician(s): Dr. Arther Dames  History of Present Illness: Taylor Watson is a 82 y.o. female with new onset lower GI bleed. Pt at [facility], had PRBC study showing evidence of active bleed, likely form right colon. Deemed too high risk for colonoscopy, she was transferred to [facility] hospital for potential IR angiogram. Has had CT abdomen as well, suggestive filling defect in cecum, but not definitive. Has continued to have bloody BMs, though hemodynamically stable. Last Hgb 6.0 has received 2 units PRBC since then. No family in room, but talked to daughter via phone. Pt denies abd pain, N/V She is DNR  Past Medical History  Diagnosis Date  . Hypertension   . CVA (cerebral vascular accident) ([place])   . Anemia   . Hypercholesterolemia   . GERD (gastroesophageal reflux disease)   . Allergy   . Hx: UTI (urinary tract infection)   . History of blood transfusion   . History of kidney stones   . Depression     Past Surgical History  Procedure Laterality Date  . Cholecystectomy  1984  . Neck lesion biopsy  2015    Followed by Dr. Richardson Landry    Allergies: Dyazide; Monopril; Toprol xl; and Verapamil  Medications:  Current facility-administered medications:  .  0.9 %  sodium chloride infusion, , Intravenous, Continuous, Thurnell Lose, MD, Last Rate: 35 mL/hr at 06/01/15 0816 .  acetaminophen (TYLENOL) tablet 650 mg, 650 mg, Oral, Q6H PRN **OR** acetaminophen (TYLENOL) suppository 650 mg, 650 mg, Rectal, Q6H PRN, Ivor Costa, MD .  azithromycin (ZITHROMAX) 500 mg in dextrose 5 % 250 mL IVPB, 500 mg, Intravenous, Q24H, Ivor Costa, MD, 500 mg at 06/01/15 0211 .  cefTRIAXone (ROCEPHIN) 1 g in dextrose 5 % 50 mL IVPB, 1 g, Intravenous, Q24H, Ivor Costa, MD, 1 g at 06/01/15 0153 .  dextromethorphan-guaiFENesin ([place] DM) 30-600 MG per 12 hr tablet 1 tablet, 1  tablet, Oral, BID PRN, Ivor Costa, MD .  escitalopram (LEXAPRO) tablet 10 mg, 10 mg, Oral, Daily, Ivor Costa, MD .  ipratropium-albuterol (DUONEB) 0.5-2.5 (3) MG/3ML nebulizer solution 3 mL, 3 mL, Nebulization, Q4H PRN, Ivor Costa, MD .  morphine 2 MG/ML injection 1 mg, 1 mg, Intravenous, Q4H PRN, Ivor Costa, MD .  [DISCONTINUED] ondansetron (ZOFRAN) tablet 4 mg, 4 mg, Oral, Q6H PRN **OR** ondansetron (ZOFRAN) injection 4 mg, 4 mg, Intravenous, Q6H PRN, Ivor Costa, MD .  pantoprazole (PROTONIX) injection 40 mg, 40 mg, Intravenous, Q12H, Ivor Costa, MD, 40 mg at 06/01/15 0156 .  sodium chloride flush (NS) 0.9 % injection 3 mL, 3 mL, Intravenous, Q12H, Ivor Costa, MD, 3 mL at 06/01/15 0154    Family History  Problem Relation Age of Onset  . Cancer Mother     unknown type  . Hypertension Mother   . Cancer Father     unknown type  . Breast cancer Daughter   . Colon cancer      grandfather    Social History   Social History  . Marital Status: Widowed    Spouse Name: N/A  . Number of Children: 5  . Years of Education: N/A   Social History Main Topics  . Smoking status: Former Research scientist (life sciences)  . Smokeless tobacco: Former Systems developer    Types: Chew  . Alcohol Use: No  . Drug Use: No  . Sexual Activity: Not Currently  Other Topics Concern  . None   Social History Narrative     Review of Systems: A 12 point ROS discussed and pertinent positives are indicated in the HPI above.  All other systems are negative.  Review of Systems  Constitutional: Positive for fever, appetite change and fatigue. Negative for chills and diaphoresis.  HENT: Negative.   Respiratory: Negative.   Cardiovascular: Negative.   Gastrointestinal: Positive for blood in stool and anal bleeding. Negative for nausea, vomiting, abdominal pain, diarrhea and constipation.  Genitourinary: Negative.   Musculoskeletal: Negative.   Skin: Negative.   Allergic/Immunologic: Negative.   Neurological: Negative.   Hematological: Negative.    Psychiatric/Behavioral: Negative.     Vital Signs: BP 133/63 mmHg  Pulse 72  Temp(Src) 97.7 F (36.5 C) (Oral)  Resp 16  Ht 5\' 5"  (1.651 m)  Wt 126 lb 1.7 oz (57.2 kg)  BMI 20.98 kg/m2  SpO2 100%  Physical Exam  Constitutional: She is oriented to person, place, and time. She appears well-developed. No distress.  HENT:  Head: Normocephalic.  Mouth/Throat: Oropharynx is clear and moist.  Neck: Normal range of motion. No tracheal deviation present.  Cardiovascular: Normal rate, regular rhythm and normal heart sounds.   Bilateral femoral pulses palpable  Pulmonary/Chest: Effort normal and breath sounds normal. No respiratory distress.  Abdominal: Soft. She exhibits no mass. There is no tenderness.  Neurological: She is alert and oriented to person, place, and time.  Psychiatric: She has a normal mood and affect.    Mallampati Score:  MD Evaluation Airway: WNL Heart: WNL Abdomen: WNL Chest/ Lungs: WNL ASA  Classification: 3 Mallampati/Airway Score: Two  Imaging: Nm Gi Blood Loss  05/31/2015  CLINICAL DATA:  Patient with multiple episodes of blood within the stool. Week and fatigue. Low hemoglobin. EXAM: NUCLEAR MEDICINE GASTROINTESTINAL BLEEDING SCAN TECHNIQUE: Sequential abdominal images were obtained following intravenous administration of Tc-74m labeled red blood cells. RADIOPHARMACEUTICALS:  21.7 mCi Tc-47m in-vitro labeled red cells. COMPARISON:  None. FINDINGS: Radiotracer is demonstrated within the expected location of the cecum and ascending colon progressing distally into the transverse and proximal descending colon. Tracer is demonstrated within the vasculature as well as within the bladder. IMPRESSION: Positive bleeding examination, with origin likely within the region of the cecum/proximal ascending colon. Critical Value/emergent results were called by telephone at the time of interpretation on 05/31/2015 at 7:08 pm to Dr. Abel Presto , who verbally acknowledged  these results. Electronically Signed   By: Lovey Newcomer M.D.   On: 05/31/2015 19:09   Ct Abdomen Pelvis W Contrast  06/01/2015  CLINICAL DATA:  Colonic GI bleed EXAM: CT ABDOMEN AND PELVIS WITH CONTRAST TECHNIQUE: Multidetector CT imaging of the abdomen and pelvis was performed using the standard protocol following bolus administration of intravenous contrast. CONTRAST:  1mL OMNIPAQUE IOHEXOL 300 MG/ML  SOLN COMPARISON:  05/31/2015 FINDINGS: Lung bases demonstrate bibasilar atelectasis without sizable effusions. Gallbladder is been surgically removed. The liver demonstrates a 2.2 cmhepatic cyst in the left lobe stable from the prior exam. The spleen, adrenal glands and pancreas are within normal limits with the exception of some mild calcifications within the pancreas which are stable in appearance from the prior exam. The kidneys demonstrate a normal enhancement pattern. Prominent extrarenal pelves are noted bilaterally. These findings are stable from the prior exam. No obstructive changes are seen. Bladder is well distended. The colon demonstrates diffuse diverticular change without evidence of diverticulitis. The appendix is filled with barium. The ascending colon demonstrates diverticular change which  may be the etiology of the patient's underlying hemorrhage. Additionally a filling defect is noted within the barium in the cecum in the possibility of an underlying polypoid lesion cannot be totally excluded. Diffuse aortoiliac calcifications are noted without aneurysmal dilatation. Calcified uterine fibroids are seen. The osseous structures show no acute abnormality. IMPRESSION: Filling defect within the cecum. This may be in part related incomplete distension although the possibility of a polypoid lesion would deserve consideration given the recent hemorrhage. Diverticulosis without diverticulitis. Diverticular change may be the etiology of the patient's right colon hemorrhage. Chronic changes as described  above. Electronically Signed   By: Inez Catalina M.D.   On: 06/01/2015 07:30   Dg Chest Port 1 View  06/01/2015  CLINICAL DATA:  Gastrointestinal bleeding EXAM: PORTABLE CHEST 1 VIEW COMPARISON:  10/22/2013 FINDINGS: Heart is upper limits normal in size. Increasing bibasilar airspace opacities, likely atelectasis. Possible small left effusion. No acute bony abnormality. IMPRESSION: Increasing bibasilar opacities, likely atelectasis. Question small left effusion. Electronically Signed   By: Rolm Baptise M.D.   On: 06/01/2015 07:07    Labs:  CBC:  Recent Labs  05/29/15 1703  05/30/15 0554 05/30/15 2049 05/31/15 0254 05/31/15 0955 05/31/15 1649 05/31/15 2135 06/01/15 0152  WBC 5.7  --  4.1  --  6.8  --   --   --  11.2*  HGB 10.8*  < > 7.0* 7.0* 7.5* 6.7* 7.7* 6.3* 6.0*  HCT 33.9*  --  21.6* 22.0* 22.7*  --   --   --  17.9*  PLT 271  --  196  --  168  --   --   --  147*  < > = values in this interval not displayed.  COAGS:  Recent Labs  06/01/15 0252  INR 1.38  APTT <20*    BMP:  Recent Labs  05/28/15 1300 05/29/15 1703 05/30/15 0554 05/31/15 0254  NA 140 139 135 139  K 3.7 4.1 3.5 3.5  CL 101 105 106 110  CO2 32 29 25 26   GLUCOSE 87 108* 114* 124*  BUN 8 18 19 20   CALCIUM 9.4 9.0 7.7* 7.6*  CREATININE 0.73 0.81 0.83 0.76  GFRNONAA  --  >60 59* >60  GFRAA  --  >60 >60 >60    LIVER FUNCTION TESTS:  Recent Labs  08/05/14 1543 12/05/14 1507 05/28/15 1300 05/29/15 1703  BILITOT 0.8 0.9 0.9 0.7  AST 10 11 10  14*  ALT 5 5 4  <5*  ALKPHOS 104 99 105 93  PROT 7.0 6.9 7.0 6.9  ALBUMIN 3.9 3.8 3.9 3.6    Assessment and Plan: Lower GI bleed Hemodynamically stable with volume, but with another fresh bloody BM this am, she is likely still bleeding. Will move forward with Angiogram and possible embolization. D/w pt and with daughter, however pt wishes to wait until daughter is here at hospital to d/w here because she will agree to proceed. Risks and Benefits  discussed with the patient including, but not limited to bleeding, infection, vascular injury or contrast induced renal failure. Explained she has calcified vessels and we may not be able to navigate very well, also explained possibility that we may not see bleeding source.thereore not able to embolize anything. All of the patient's questions were answered   Thank you for this interesting consult.  A copy of this report was sent to the requesting provider on this date.  Electronically Signed: Ascencion Dike 06/01/2015, 9:00 AM   I spent a total of  65 Minutes  in face to face in clinical consultation, greater than 50% of which was counseling/coordinating care for lower GI bleed requiring visceral angiogram

## 2015-06-01 NOTE — Sedation Documentation (Signed)
Patient slightly restless

## 2015-06-01 NOTE — Progress Notes (Signed)
Paged hospitalist four times in over an hour trying to get MD to provide an order to assist in pt transfer to [facility] via [place]. MD Dr. Claria Dice return page after trying to locate her by calling the ED, overhead page, speaking with the nursing supervisor and the supervisor finally found MD. Pt was DNR and family reported that pt wanted to be full code. Pt was made full code prior to transfer to [facility]. Pt hemoglobin was 6.3 at transfer. Report was called to Jerrye Bushy at [place].

## 2015-06-02 ENCOUNTER — Encounter: Payer: Self-pay | Admitting: Internal Medicine

## 2015-06-02 LAB — BASIC METABOLIC PANEL
ANION GAP: 7 (ref 5–15)
BUN: 8 mg/dL (ref 6–20)
CALCIUM: 8 mg/dL — AB (ref 8.9–10.3)
CO2: 23 mmol/L (ref 22–32)
Chloride: 108 mmol/L (ref 101–111)
Creatinine, Ser: 0.83 mg/dL (ref 0.44–1.00)
GFR calc Af Amer: >60 mL/min (ref >60)
GFR, EST NON AFRICAN AMERICAN: 59 mL/min — AB (ref >60)
GLUCOSE: 92 mg/dL (ref 65–99)
POTASSIUM: 3.5 mmol/L (ref 3.5–5.1)
Sodium: 138 mmol/L (ref 135–145)

## 2015-06-02 LAB — CBC
HEMATOCRIT: 23 % — AB (ref 36.0–46.0)
HEMATOCRIT: 23.2 % — AB (ref 36.0–46.0)
HEMATOCRIT: 25.7 % — AB (ref 36.0–46.0)
HEMOGLOBIN: 7.9 g/dL — AB (ref 12.0–15.0)
Hemoglobin: 7.7 g/dL — ABNORMAL LOW (ref 12.0–15.0)
Hemoglobin: 8.6 g/dL — ABNORMAL LOW (ref 12.0–15.0)
MCH: 28 pg (ref 26.0–34.0)
MCH: 28.4 pg (ref 26.0–34.0)
MCH: 28.9 pg (ref 26.0–34.0)
MCHC: 34.1 g/dL (ref 30.0–36.0)
MCHC: 33.5 g/dL (ref 30.0–36.0)
MCHC: 33.5 g/dL (ref 30.0–36.0)
MCV: 84.9 fL (ref 78.0–100.0)
MCV: 85 fL (ref 78.0–100.0)
MCV: 83.7 fL (ref 78.0–100.0)
PLATELETS: 131 10*3/uL — AB (ref 150–400)
PLATELETS: 115 10*3/uL — AB (ref 150–400)
Platelets: 120 10*3/uL — ABNORMAL LOW (ref 150–400)
RBC: 2.71 MIL/uL — ABNORMAL LOW (ref 3.87–5.11)
RBC: 2.73 MIL/uL — ABNORMAL LOW (ref 3.87–5.11)
RBC: 3.07 MIL/uL — ABNORMAL LOW (ref 3.87–5.11)
RDW: 14.9 % (ref 11.5–15.5)
RDW: 14.8 % (ref 11.5–15.5)
RDW: 14.5 % (ref 11.5–15.5)
WBC: 6.8 10*3/uL (ref 4.0–10.5)
WBC: 7.8 10*3/uL (ref 4.0–10.5)
WBC: 8.4 10*3/uL (ref 4.0–10.5)

## 2015-06-02 LAB — URINALYSIS, ROUTINE W REFLEX MICROSCOPIC
BILIRUBIN URINE: NEGATIVE
GLUCOSE, UA: NEGATIVE mg/dL
Ketones, ur: NEGATIVE mg/dL
Nitrite: NEGATIVE
PROTEIN: NEGATIVE mg/dL
SPECIFIC GRAVITY, URINE: 1.011 (ref 1.005–1.030)
pH: 5 (ref 5.0–8.0)

## 2015-06-02 LAB — TYPE AND SCREEN
ABO/RH(D): B POS
ABO/RH(D): B POS
ANTIBODY SCREEN: NEGATIVE
ANTIBODY SCREEN: NEGATIVE
UNIT DIVISION: 0
UNIT DIVISION: 0
UNIT DIVISION: 0
Unit division: 0
Unit division: 0

## 2015-06-02 LAB — MAGNESIUM: Magnesium: 1.5 mg/dL — ABNORMAL LOW (ref 1.7–2.4)

## 2015-06-02 LAB — URINE MICROSCOPIC-ADD ON

## 2015-06-02 MED ORDER — POTASSIUM CHLORIDE IN NACL 40-0.9 MEQ/L-% IV SOLN
INTRAVENOUS | Status: DC
  Administered 2015-06-02 – 2015-06-03 (×2): 50 mL/h via INTRAVENOUS
  Filled 2015-06-02 (×3): qty 1000

## 2015-06-02 MED ORDER — MAGNESIUM SULFATE 2 GM/50ML IV SOLN
2.0000 g | Freq: Once | INTRAVENOUS | Status: AC
Start: 2015-06-02 — End: 2015-06-02
  Administered 2015-06-02: 2 g via INTRAVENOUS
  Filled 2015-06-02: qty 50

## 2015-06-02 NOTE — Progress Notes (Signed)
UNASSIGNED PATIENT Subjective: Patient continues to have rectal bleeding but not as profusely as she did on admission. She has had some bleeding with a BM earlier today. She denies having any abdominal pain. She received 2 units of PRBC's yesterday. Nuclear medicine tagged RBC scan was positive in the right colon but IR was not able to localize the bleed. Possible cecal mass on CT.   Objective: Vital signs in last 24 hours: Temp:  [97.8 F (36.6 C)-98.9 F (37.2 C)] 98.7 F (37.1 C) (01/30 1129) Pulse Rate:  [25-99] 83 (01/30 1129) Resp:  [14-24] 17 (01/30 1129) BP: (91-159)/(37-71) 117/54 mmHg (01/30 1129) SpO2:  [94 %-100 %] 100 % (01/30 1129) Last BM Date: 06/01/15  Intake/Output from previous day: 01/29 0701 - 01/30 0700 In: 1473 [I.V.:963; Blood:335; IV Piggyback:175] Out: 525 [Urine:525] Intake/Output this shift: Total I/O In: 153 [I.V.:153] Out: -   General appearance: alert, cooperative, appears stated age and fatigued Resp: clear to auscultation bilaterally Cardio: regular rate and rhythm, S1, S2 normal, no murmur, click, rub or gallop GI: soft, non-tender; bowel sounds normal; no masses,  no organomegaly  Lab Results:  Recent Labs  06/01/15 1832 06/02/15 0031 06/02/15 1011  WBC 11.0* 8.4 7.8  HGB 8.6* 8.6* 7.9*  HCT 28.0* 25.7* 23.2*  PLT 114* 115* 120*   BMET  Recent Labs  05/31/15 0254 06/01/15 1250 06/02/15 1011  NA 139 137 138  K 3.5 3.6 3.5  CL 110 111 108  CO2 26 21* 23  GLUCOSE 124* 92 92  BUN 20 13 8   CREATININE 0.76 0.84 0.83  CALCIUM 7.6* 7.6* 8.0*   LFT No results for input(s): PROT, ALBUMIN, AST, ALT, ALKPHOS, BILITOT, BILIDIR, IBILI in the last 72 hours. PT/INR  Recent Labs  06/01/15 0252  LABPROT 17.1*  INR 1.38   Studies/Results: Nm Gi Blood Loss  05/31/2015  CLINICAL DATA:  Patient with multiple episodes of blood within the stool. Week and fatigue. Low hemoglobin. EXAM: NUCLEAR MEDICINE GASTROINTESTINAL BLEEDING SCAN  TECHNIQUE: Sequential abdominal images were obtained following intravenous administration of Tc-19m labeled red blood cells. RADIOPHARMACEUTICALS:  21.7 mCi Tc-16m in-vitro labeled red cells. COMPARISON:  None. FINDINGS: Radiotracer is demonstrated within the expected location of the cecum and ascending colon progressing distally into the transverse and proximal descending colon. Tracer is demonstrated within the vasculature as well as within the bladder. IMPRESSION: Positive bleeding examination, with origin likely within the region of the cecum/proximal ascending colon. Critical Value/emergent results were called by telephone at the time of interpretation on 05/31/2015 at 7:08 pm to Dr. Abel Presto , who verbally acknowledged these results. Electronically Signed   By: Lovey Newcomer M.D.   On: 05/31/2015 19:09   Ct Abdomen Pelvis W Contrast  06/01/2015  CLINICAL DATA:  Colonic GI bleed EXAM: CT ABDOMEN AND PELVIS WITH CONTRAST TECHNIQUE: Multidetector CT imaging of the abdomen and pelvis was performed using the standard protocol following bolus administration of intravenous contrast. CONTRAST:  41mL OMNIPAQUE IOHEXOL 300 MG/ML  SOLN COMPARISON:  05/31/2015 FINDINGS: Lung bases demonstrate bibasilar atelectasis without sizable effusions. Gallbladder is been surgically removed. The liver demonstrates a 2.2 cmhepatic cyst in the left lobe stable from the prior exam. The spleen, adrenal glands and pancreas are within normal limits with the exception of some mild calcifications within the pancreas which are stable in appearance from the prior exam. The kidneys demonstrate a normal enhancement pattern. Prominent extrarenal pelves are noted bilaterally. These findings are stable from the prior exam. No  obstructive changes are seen. Bladder is well distended. The colon demonstrates diffuse diverticular change without evidence of diverticulitis. The appendix is filled with barium. The ascending colon demonstrates  diverticular change which may be the etiology of the patient's underlying hemorrhage. Additionally a filling defect is noted within the barium in the cecum in the possibility of an underlying polypoid lesion cannot be totally excluded. Diffuse aortoiliac calcifications are noted without aneurysmal dilatation. Calcified uterine fibroids are seen. The osseous structures show no acute abnormality. IMPRESSION: Filling defect within the cecum. This may be in part related incomplete distension although the possibility of a polypoid lesion would deserve consideration given the recent hemorrhage. Diverticulosis without diverticulitis. Diverticular change may be the etiology of the patient's right colon hemorrhage. Chronic changes as described above. Electronically Signed   By: Inez Catalina M.D.   On: 06/01/2015 07:30   Ir Angiogram Visceral Selective  06/01/2015  INDICATION: Acute lower GI bleeding originating in the cecum. EXAM: SELECTIVE VISCERAL ARTERIOGRAPHY; IR ULTRASOUND GUIDANCE VASC ACCESS RIGHT MEDICATIONS: 1% lidocaine locally. ANESTHESIA/SEDATION: Fentanyl 25 mcg IV; Versed 1.5 mg IV Moderate Sedation Time:  30 minutes The patient was continuously monitored during the procedure by the interventional radiology nurse under my direct supervision. CONTRAST:  59mL OMNIPAQUE IOHEXOL 300 MG/ML  SOLN FLUOROSCOPY TIME:  Fluoroscopy Time: 7 minutes 48 seconds (241 mGy). COMPLICATIONS: None immediate. PROCEDURE: Informed consent was obtained from the patient following explanation of the procedure, risks, benefits and alternatives. The patient understands, agrees and consents for the procedure. All questions were addressed. A time out was performed prior to the initiation of the procedure. Maximal barrier sterile technique utilized including caps, mask, sterile gowns, sterile gloves, large sterile drape, hand hygiene, and Betadine prep. Under sterile conditions and local anesthesia, right common femoral artery ultrasound  micropuncture access performed. Images obtained for documentation. Five French sheath inserted. Initially a C2 catheter was advanced into the aorta. Attempts were made to cannulate the SMA with a C2 catheter however this was unsuccessful. Catheter was exchanged for a Sos Omni select catheter. This catheter was reformed in the aorta and utilized to select the SMA. SMA angiograms performed in the frontal and oblique projections. SMA: Atherosclerotic changes of the SMA origin and trunk. SMA trunk is widely patent. Jejunal and colic branches are patent. 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 No evidence of active contrast extravasation or active bleeding in the right colon. On the venous phase, the superior mesenteric vein and portal vein are both patent. Catheter access removed. Hemostasis obtained with manual compression. No immediate complication. Patient tolerated the procedure well. IMPRESSION: Negative for active mesenteric bleeding in the right colon. Electronically Signed   By: Jerilynn Mages.  Shick M.D.   On: 06/01/2015 12:28   Ir US Guide Vasc Access Right  06/01/2015  INDICATION: Acute lower GI bleeding originating in the cecum. EXAM: SELECTIVE VISCERAL ARTERIOGRAPHY; IR ULTRASOUND GUIDANCE VASC ACCESS RIGHT MEDICATIONS: 1% lidocaine locally. ANESTHESIA/SEDATION: Fentanyl 25 mcg IV; Versed 1.5 mg IV Moderate Sedation Time:  30 minutes The patient was continuously monitored during the procedure by the interventional radiology nurse under my direct supervision. CONTRAST:  26mL OMNIPAQUE IOHEXOL 300 MG/ML  SOLN FLUOROSCOPY TIME:  Fluoroscopy Time: 7 minutes 48 seconds (241 mGy). COMPLICATIONS: None immediate. PROCEDURE: Informed consent was obtained from the patient following explanation of the procedure, risks, benefits and alternatives. The patient understands, agrees and consents for the procedure. All questions were addressed. A time out was performed  prior  to the initiation of the procedure. Maximal barrier sterile technique utilized including caps, mask, sterile gowns, sterile gloves, large sterile drape, hand hygiene, and Betadine prep. Under sterile conditions and local anesthesia, right common femoral artery ultrasound micropuncture access performed. Images obtained for documentation. Five French sheath inserted. Initially a C2 catheter was advanced into the aorta. Attempts were made to cannulate the SMA with a C2 catheter however this was unsuccessful. Catheter was exchanged for a Sos Omni select catheter. This catheter was reformed in the aorta and utilized to select the SMA. SMA angiograms performed in the frontal and oblique projections. SMA: Atherosclerotic changes of the SMA origin and trunk. SMA trunk is widely patent. Jejunal and colic branches are patent. 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 No evidence of active contrast extravasation or active bleeding in the right colon. On the venous phase, the superior mesenteric vein and portal vein are both patent. Catheter access removed. Hemostasis obtained with manual compression. No immediate complication. Patient tolerated the procedure well. IMPRESSION: Negative for active mesenteric bleeding in the right colon. Electronically Signed   By: Jerilynn Mages.  Shick M.D.   On: 06/01/2015 12:28   Dg Chest Port 1 View  06/01/2015  CLINICAL DATA:  Gastrointestinal bleeding EXAM: PORTABLE CHEST 1 VIEW COMPARISON:  10/22/2013 FINDINGS: Heart is upper limits normal in size. Increasing bibasilar airspace opacities, likely atelectasis. Possible small left effusion. No acute bony abnormality. IMPRESSION: Increasing bibasilar opacities, likely atelectasis. Question small left effusion. Electronically Signed   By: Rolm Baptise M.D.   On: 06/01/2015 07:07   Medications: I have reviewed the patient's current medications.  Assessment/Plan: Acute GI bleed:  possibly from diverticulosis-?mass in the cecum-filling defect in the cecum-will continue conservative care for now as discussed with Dr. Candiss Norse. I do not feel she can even tolerate a prep for the colonoscopy. Will continue supportive care.    LOS: 2 days   Maycel Riffe 06/02/2015, 3:05 PM

## 2015-06-02 NOTE — Plan of Care (Signed)
Problem: Bowel/Gastric: Goal: Will show no signs and symptoms of gastrointestinal bleeding Outcome: Progressing Patient still had two bloody bowel movements last night.

## 2015-06-02 NOTE — Assessment & Plan Note (Signed)
On lexapro.  Stable.  Doing better.  Follow.

## 2015-06-02 NOTE — Progress Notes (Signed)
Initial Nutrition Assessment  DOCUMENTATION CODES:   Not applicable  INTERVENTION:   Advance diet as medically appropriate, add supplements when able  NUTRITION DIAGNOSIS:   Inadequate oral intake related to inability to eat as evidenced by NPO status  GOAL:   Patient will meet greater than or equal to 90% of their needs  MONITOR:   Diet advancement, PO intake, Labs, Weight trends, I & O's  REASON FOR ASSESSMENT:   Malnutrition Screening Tool  ASSESSMENT:   82 y.o. Female with PMH of hypertension, hyperlipidemia, GERD, CVA, kidney stone, allergy, anemia, who presents with appendicitis rectal bleeding.  RD unable to obtain nutrition hx of compete Nutrition Focused Physical Exam at this time.  No family present.  Patient laying in bed with mouth open.  Currently NPO.  Seen per RD at [facility].  Pt reported drinking Ensure supplements occasionally.  RD suspects malnutrition, however, unable to identify at this time.  Diet Order:  Diet NPO time specified Except for: Sips with Meds  Skin:  Reviewed, no issues  Last BM:  1/29  Height:   Ht Readings from Last 1 Encounters:  05/31/15 5\' 5"  (1.651 m)    Weight:   Wt Readings from Last 1 Encounters:  05/31/15 126 lb 1.7 oz (57.2 kg)    Ideal Body Weight:  56.8 kg  BMI:  Body mass index is 20.98 kg/(m^2).  Estimated Nutritional Needs:   Kcal:  1200-1400  Protein:  55-65 gm  Fluid:  >/= 1.5 L  EDUCATION NEEDS:   No education needs identified at this time  Arthur Holms, RD, LDN Pager #: [phone] After-Hours Pager #: [phone]

## 2015-06-02 NOTE — Progress Notes (Signed)
Patient Demographics:    Taylor Watson, is a 82 y.o. female, DOB - 04/30/1919, [id number]  Admit date - 05/31/2015   Admitting Physician Ivor Costa, MD  Outpatient Primary MD for the patient is [name], MD  LOS - 2   No chief complaint on file.       Subjective:    Taylor Watson today has, No headache, No chest pain, No abdominal pain - No Nausea, No new weakness tingling or numbness, No Cough - SOB.    Assessment  & Plan :     1. Ongoing lower GI bleed with positive tagged RBC scan showing ascending colon bleed, blood loss rated anemia due to the same. Receive 2 units of packed RBC on 06/01/2015 with stable H&H which will be monitored, on PPI, GI on board. IR performed an arteriogram on 06/01/2015 without any evidence of ongoing bleed.    She has had some unintentional weight loss and nonspecific CT abdomen pelvis findings questioning underlying malignancy. We will defer management to GI. Poor candidate for surgery chemoradiation.   2. Mild underlying depression. On Lexapro at home continue.   3. Elevated lactic acid. Due to blood loss and hypotension. Currently not septic, there was question of sepsis upon admission which is unclear to me. Monitor cultures. Has bibasilar atelectasis and UA is pending. She was febrile when she was admitted. Or now continue Rocephin and azithromycin and monitor.    Code Status : DO NOT RESUSCITATE  Family Communication  : None present, called and left message for patient's daughter on 06/02/2015 9.10 AM.  Disposition Plan  : Remain in stepdown  Consults  : GI, IR  Procedures  :   Tagged RBC scan shows active bleeding at the ascending colon.  Arteriogram by IR for lower GI bleed on 06/01/2015   DVT Prophylaxis  :    SCDs    Lab  Results  Component Value Date   PLT 115* 06/02/2015    Inpatient Medications  Scheduled Meds: . azithromycin  250 mg Intravenous Q24H  . cefTRIAXone (ROCEPHIN)  IV  1 g Intravenous Q24H  . escitalopram  10 mg Oral Daily  . magnesium sulfate 1 - 4 g bolus IVPB  2 g Intravenous Once  . pantoprazole (PROTONIX) IV  40 mg Intravenous Q12H  . sodium chloride flush  3 mL Intravenous Q12H   Continuous Infusions: . 0.9 % NaCl with KCl 40 mEq / L 50 mL/hr (06/02/15 0537)   PRN Meds:.acetaminophen **OR** acetaminophen, dextromethorphan-guaiFENesin, ipratropium-albuterol, morphine injection, [DISCONTINUED] ondansetron **OR** ondansetron (ZOFRAN) IV  Antibiotics  :    Anti-infectives    Start     Dose/Rate Route Frequency Ordered Stop   06/02/15 0200  azithromycin (ZITHROMAX) 250 mg in dextrose 5 % 125 mL IVPB     250 mg 125 mL/hr over 60 Minutes Intravenous Every 24 hours 06/01/15 1017     06/02/15 0130  cefTRIAXone (ROCEPHIN) 1 g in dextrose 5 % 50 mL IVPB     1 g 100 mL/hr over 30 Minutes Intravenous Every 24 hours 06/01/15 1017     06/01/15 0130  cefTRIAXone (ROCEPHIN) 1 g in dextrose 5 % 50 mL IVPB  Status:  Discontinued     1 g 100 mL/hr  over 30 Minutes Intravenous Every 24 hours 06/01/15 0120 06/01/15 1015   06/01/15 0130  azithromycin (ZITHROMAX) 500 mg in dextrose 5 % 250 mL IVPB  Status:  Discontinued     500 mg 250 mL/hr over 60 Minutes Intravenous Every 24 hours 06/01/15 0120 06/01/15 1015        Objective:   Filed Vitals:   06/02/15 0500 06/02/15 0600 06/02/15 0625 06/02/15 0800  BP: 129/46  142/71 159/62  Pulse: 94 76 74   Temp:    98.4 F (36.9 C)  TempSrc:    Oral  Resp: 20 16 18 18   Height:      Weight:      SpO2: 94% 100% 100%     Wt Readings from Last 3 Encounters:  05/31/15 57.2 kg (126 lb 1.7 oz)  05/29/15 55.067 kg (121 lb 6.4 oz)  05/28/15 56.813 kg (125 lb 4 oz)     Intake/Output Summary (Last 24 hours) at 06/02/15 0912 Last data filed at  06/02/15 0600  Gross per 24 hour  Intake   1423 ml  Output    525 ml  Net    898 ml     Physical Exam  Awake, Oriented X 1, No new F.N deficits, Normal affect [place].AT,PERRAL Supple Neck,No JVD, No cervical lymphadenopathy appriciated.  Symmetrical Chest wall movement, Good air movement bilaterally, CTAB RRR,No Gallops,Rubs or new Murmurs, No Parasternal Heave +ve B.Sounds, Abd Soft, No tenderness, No organomegaly appriciated, No rebound - guarding or rigidity. No Cyanosis, Clubbing or edema, No new Rash or bruise       Data Review:   Micro Results Recent Results (from the past 240 hour(s))  MRSA PCR Screening     Status: None   Collection Time: 05/31/15 11:43 PM  Result Value Ref Range Status   MRSA by PCR NEGATIVE NEGATIVE Final    Comment:        The GeneXpert MRSA Assay (FDA approved for NASAL specimens only), is one component of a comprehensive MRSA colonization surveillance program. It is not intended to diagnose MRSA infection nor to guide or monitor treatment for MRSA infections.   Culture, blood (x 2)     Status: None (Preliminary result)   Collection Time: 06/01/15  2:59 AM  Result Value Ref Range Status   Specimen Description BLOOD RIGHT HAND  Final   Special Requests BOTTLES DRAWN AEROBIC AND ANAEROBIC 5CC  Final   Culture PENDING  Incomplete   Report Status PENDING  Incomplete  Culture, blood (x 2)     Status: None (Preliminary result)   Collection Time: 06/01/15  4:31 AM  Result Value Ref Range Status   Specimen Description BLOOD RIGHT ARM  Final   Special Requests BOTTLES DRAWN AEROBIC AND ANAEROBIC 5CC  Final   Culture PENDING  Incomplete   Report Status PENDING  Incomplete    Radiology Reports Nm Gi Blood Loss  05/31/2015  CLINICAL DATA:  Patient with multiple episodes of blood within the stool. Week and fatigue. Low hemoglobin. EXAM: NUCLEAR MEDICINE GASTROINTESTINAL BLEEDING SCAN TECHNIQUE: Sequential abdominal images were obtained following  intravenous administration of Tc-66m labeled red blood cells. RADIOPHARMACEUTICALS:  21.7 mCi Tc-43m in-vitro labeled red cells. COMPARISON:  None. FINDINGS: Radiotracer is demonstrated within the expected location of the cecum and ascending colon progressing distally into the transverse and proximal descending colon. Tracer is demonstrated within the vasculature as well as within the bladder. IMPRESSION: Positive bleeding examination, with origin likely within the region of the cecum/proximal  ascending colon. Critical Value/emergent results were called by telephone at the time of interpretation on 05/31/2015 at 7:08 pm to Dr. Abel Presto , who verbally acknowledged these results. Electronically Signed   By: Lovey Newcomer M.D.   On: 05/31/2015 19:09   Ct Abdomen Pelvis W Contrast  06/01/2015  CLINICAL DATA:  Colonic GI bleed EXAM: CT ABDOMEN AND PELVIS WITH CONTRAST TECHNIQUE: Multidetector CT imaging of the abdomen and pelvis was performed using the standard protocol following bolus administration of intravenous contrast. CONTRAST:  64mL OMNIPAQUE IOHEXOL 300 MG/ML  SOLN COMPARISON:  05/31/2015 FINDINGS: Lung bases demonstrate bibasilar atelectasis without sizable effusions. Gallbladder is been surgically removed. The liver demonstrates a 2.2 cmhepatic cyst in the left lobe stable from the prior exam. The spleen, adrenal glands and pancreas are within normal limits with the exception of some mild calcifications within the pancreas which are stable in appearance from the prior exam. The kidneys demonstrate a normal enhancement pattern. Prominent extrarenal pelves are noted bilaterally. These findings are stable from the prior exam. No obstructive changes are seen. Bladder is well distended. The colon demonstrates diffuse diverticular change without evidence of diverticulitis. The appendix is filled with barium. The ascending colon demonstrates diverticular change which may be the etiology of the patient's  underlying hemorrhage. Additionally a filling defect is noted within the barium in the cecum in the possibility of an underlying polypoid lesion cannot be totally excluded. Diffuse aortoiliac calcifications are noted without aneurysmal dilatation. Calcified uterine fibroids are seen. The osseous structures show no acute abnormality. IMPRESSION: Filling defect within the cecum. This may be in part related incomplete distension although the possibility of a polypoid lesion would deserve consideration given the recent hemorrhage. Diverticulosis without diverticulitis. Diverticular change may be the etiology of the patient's right colon hemorrhage. Chronic changes as described above. Electronically Signed   By: Inez Catalina M.D.   On: 06/01/2015 07:30   Ir Angiogram Visceral Selective  06/01/2015  INDICATION: Acute lower GI bleeding originating in the cecum. EXAM: SELECTIVE VISCERAL ARTERIOGRAPHY; IR ULTRASOUND GUIDANCE VASC ACCESS RIGHT MEDICATIONS: 1% lidocaine locally. ANESTHESIA/SEDATION: Fentanyl 25 mcg IV; Versed 1.5 mg IV Moderate Sedation Time:  30 minutes The patient was continuously monitored during the procedure by the interventional radiology nurse under my direct supervision. CONTRAST:  36mL OMNIPAQUE IOHEXOL 300 MG/ML  SOLN FLUOROSCOPY TIME:  Fluoroscopy Time: 7 minutes 48 seconds (241 mGy). COMPLICATIONS: None immediate. PROCEDURE: Informed consent was obtained from the patient following explanation of the procedure, risks, benefits and alternatives. The patient understands, agrees and consents for the procedure. All questions were addressed. A time out was performed prior to the initiation of the procedure. Maximal barrier sterile technique utilized including caps, mask, sterile gowns, sterile gloves, large sterile drape, hand hygiene, and Betadine prep. Under sterile conditions and local anesthesia, right common femoral artery ultrasound micropuncture access performed. Images obtained for  documentation. Five French sheath inserted. Initially a C2 catheter was advanced into the aorta. Attempts were made to cannulate the SMA with a C2 catheter however this was unsuccessful. Catheter was exchanged for a Sos Omni select catheter. This catheter was reformed in the aorta and utilized to select the SMA. SMA angiograms performed in the frontal and oblique projections. SMA: Atherosclerotic changes of the SMA origin and trunk. SMA trunk is widely patent. Jejunal and colic branches are patent. 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 No evidence of active contrast extravasation or active bleeding in the right colon.  On the venous phase, the superior mesenteric vein and portal vein are both patent. Catheter access removed. Hemostasis obtained with manual compression. No immediate complication. Patient tolerated the procedure well. IMPRESSION: Negative for active mesenteric bleeding in the right colon. Electronically Signed   By: Jerilynn Mages.  Shick M.D.   On: 06/01/2015 12:28   Ir US Guide Vasc Access Right  06/01/2015  INDICATION: Acute lower GI bleeding originating in the cecum. EXAM: SELECTIVE VISCERAL ARTERIOGRAPHY; IR ULTRASOUND GUIDANCE VASC ACCESS RIGHT MEDICATIONS: 1% lidocaine locally. ANESTHESIA/SEDATION: Fentanyl 25 mcg IV; Versed 1.5 mg IV Moderate Sedation Time:  30 minutes The patient was continuously monitored during the procedure by the interventional radiology nurse under my direct supervision. CONTRAST:  24mL OMNIPAQUE IOHEXOL 300 MG/ML  SOLN FLUOROSCOPY TIME:  Fluoroscopy Time: 7 minutes 48 seconds (241 mGy). COMPLICATIONS: None immediate. PROCEDURE: Informed consent was obtained from the patient following explanation of the procedure, risks, benefits and alternatives. The patient understands, agrees and consents for the procedure. All questions were addressed. A time out was performed prior to the initiation of the procedure. Maximal  barrier sterile technique utilized including caps, mask, sterile gowns, sterile gloves, large sterile drape, hand hygiene, and Betadine prep. Under sterile conditions and local anesthesia, right common femoral artery ultrasound micropuncture access performed. Images obtained for documentation. Five French sheath inserted. Initially a C2 catheter was advanced into the aorta. Attempts were made to cannulate the SMA with a C2 catheter however this was unsuccessful. Catheter was exchanged for a Sos Omni select catheter. This catheter was reformed in the aorta and utilized to select the SMA. SMA angiograms performed in the frontal and oblique projections. SMA: Atherosclerotic changes of the SMA origin and trunk. SMA trunk is widely patent. Jejunal and colic branches are patent. 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 No evidence of active contrast extravasation or active bleeding in the right colon. On the venous phase, the superior mesenteric vein and portal vein are both patent. Catheter access removed. Hemostasis obtained with manual compression. No immediate complication. Patient tolerated the procedure well. IMPRESSION: Negative for active mesenteric bleeding in the right colon. Electronically Signed   By: Jerilynn Mages.  Shick M.D.   On: 06/01/2015 12:28   Dg Chest Port 1 View  06/01/2015  CLINICAL DATA:  Gastrointestinal bleeding EXAM: PORTABLE CHEST 1 VIEW COMPARISON:  10/22/2013 FINDINGS: Heart is upper limits normal in size. Increasing bibasilar airspace opacities, likely atelectasis. Possible small left effusion. No acute bony abnormality. IMPRESSION: Increasing bibasilar opacities, likely atelectasis. Question small left effusion. Electronically Signed   By: Rolm Baptise M.D.   On: 06/01/2015 07:07     CBC  Recent Labs Lab 05/31/15 0254  05/31/15 2135 06/01/15 0152 06/01/15 1250 06/01/15 1832 06/02/15 0031  WBC 6.8  --   --  11.2* 14.1* 11.0*  8.4  HGB 7.5*  < > 6.3* 6.0* 10.3* 8.6* 8.6*  HCT 22.7*  --   --  17.9* 30.6* 28.0* 25.7*  PLT 168  --   --  147* 114* 114* 115*  MCV 81.6  --   --  83.3 84.1 82.6 83.7  MCH 26.9  --   --  27.9 28.3 25.4* 28.0  MCHC 33.0  --   --  33.5 33.7 30.7 33.5  RDW 14.9*  --   --  15.1 14.0 14.1 14.5  < > = values in this interval not displayed.  [place] Lab 05/28/15 1300 05/29/15 1703 05/30/15 0554  05/31/15 0254 06/01/15 1250 06/02/15 0608  NA 140 139 135 139 137  --   K 3.7 4.1 3.5 3.5 3.6  --   CL 101 105 106 110 111  --   CO2 32 29 25 26  21*  --   GLUCOSE 87 108* 114* 124* 92  --   BUN 8 18 19 20 13   --   CREATININE 0.73 0.81 0.83 0.76 0.84  --   CALCIUM 9.4 9.0 7.7* 7.6* 7.6*  --   MG  --   --   --   --   --  1.5*  AST 10 14*  --   --   --   --   ALT 4 <5*  --   --   --   --   ALKPHOS 105 93  --   --   --   --   BILITOT 0.9 0.7  --   --   --   --    ------------------------------------------------------------------------------------------------------------------ No results for input(s): CHOL, HDL, LDLCALC, TRIG, CHOLHDL, LDLDIRECT in the last 72 hours.  Lab Results  Component Value Date   HGBA1C 5.7 10/04/2013   ------------------------------------------------------------------------------------------------------------------ No results for input(s): TSH, T4TOTAL, T3FREE, THYROIDAB in the last 72 hours.  Invalid input(s): FREET3 ------------------------------------------------------------------------------------------------------------------ No results for input(s): VITAMINB12, FOLATE, FERRITIN, TIBC, IRON, RETICCTPCT in the last 72 hours.  Coagulation profile  Recent Labs Lab 06/01/15 0252  INR 1.38    No results for input(s): DDIMER in the last 72 hours.  Cardiac Enzymes  Recent Labs Lab 05/29/15 2012  TROPONINI <0.03   ------------------------------------------------------------------------------------------------------------------ No  results found for: BNP  Time Spent in minutes  35   Charli Liberatore K M.D on 06/02/2015 at 9:12 AM  Between 7am to 7pm - Pager - [phone]  After 7pm go to [email or web address] - password [facility]  [facility] -  Office  [phone]

## 2015-06-02 NOTE — Assessment & Plan Note (Signed)
Saw ENT.  Brachial plexus lesion.  They discussed neurosurgery referral.  She declined.  Follow.

## 2015-06-02 NOTE — Assessment & Plan Note (Signed)
On protonix.  No upper symptoms.   

## 2015-06-02 NOTE — Progress Notes (Signed)
OT Cancellation Note  Patient Details Name: Taylor Watson MRN: [id number] DOB: 1918-12-13   Cancelled Treatment:    Reason Eval/Treat Not Completed: Patient not medically ready Pt on strict bedrest. Please update activity orders when appropriate. Thanks. [place], OTR/L  [id number] 06/02/2015 06/02/2015, 7:47 AM

## 2015-06-02 NOTE — Assessment & Plan Note (Signed)
Blood pressure under good control.  Continue same medication regimen.  Follow pressures.  Follow metabolic panel.   

## 2015-06-02 NOTE — Assessment & Plan Note (Signed)
Doing better on one whole lexapro.  Follow.

## 2015-06-02 NOTE — Progress Notes (Signed)
PT Cancellation Note  Patient Details Name: DELORUS FESMIRE MRN: [id number] DOB: Jul 22, 1918   Cancelled Treatment:    Reason Eval/Treat Not Completed: Medical issues which prohibited therapy (Pt on strict bedrest.  Please update order for PT to evaluate.)  Thanks.   Irwin Brakeman F 06/02/2015, 8:37 AM [facility] [phone] (pager)

## 2015-06-02 NOTE — Assessment & Plan Note (Signed)
Weight stable.  Eating better.  Follow.

## 2015-06-02 NOTE — Progress Notes (Signed)
Patient ID: Taylor Watson, female   DOB: 11/01/1918, 82 y.o.   MRN: [id number]   Mesenteric angio 1/29  S/p SMA angio Neg for right colon active bleed No comp Stable Full report in PACS  Rt groin NT; no bleeding No hematoma Rt foot 1+ pulses  Pt in good spirits Stable H/H this am

## 2015-06-02 NOTE — Assessment & Plan Note (Signed)
Low cholesterol diet and exercise.  Follow lipid panel.   

## 2015-06-03 ENCOUNTER — Inpatient Hospital Stay (HOSPITAL_COMMUNITY): Payer: Medicare Other

## 2015-06-03 DIAGNOSIS — Z515 Encounter for palliative care: Secondary | ICD-10-CM | POA: Insufficient documentation

## 2015-06-03 DIAGNOSIS — K922 Gastrointestinal hemorrhage, unspecified: Secondary | ICD-10-CM | POA: Insufficient documentation

## 2015-06-03 DIAGNOSIS — Z7189 Other specified counseling: Secondary | ICD-10-CM | POA: Insufficient documentation

## 2015-06-03 LAB — CBC
HCT: 26.2 % — ABNORMAL LOW (ref 36.0–46.0)
HEMATOCRIT: 23.4 % — AB (ref 36.0–46.0)
Hemoglobin: 8.1 g/dL — ABNORMAL LOW (ref 12.0–15.0)
Hemoglobin: 8.7 g/dL — ABNORMAL LOW (ref 12.0–15.0)
MCH: 29.7 pg (ref 26.0–34.0)
MCH: 28.3 pg (ref 26.0–34.0)
MCHC: 34.6 g/dL (ref 30.0–36.0)
MCHC: 33.2 g/dL (ref 30.0–36.0)
MCV: 85.3 fL (ref 78.0–100.0)
MCV: 85.7 fL (ref 78.0–100.0)
PLATELETS: 188 10*3/uL (ref 150–400)
PLATELETS: 140 10*3/uL — AB (ref 150–400)
RBC: 2.73 MIL/uL — AB (ref 3.87–5.11)
RBC: 3.07 MIL/uL — ABNORMAL LOW (ref 3.87–5.11)
RDW: 15 % (ref 11.5–15.5)
RDW: 15.2 % (ref 11.5–15.5)
WBC: 9.9 10*3/uL (ref 4.0–10.5)
WBC: 7.3 10*3/uL (ref 4.0–10.5)

## 2015-06-03 LAB — BASIC METABOLIC PANEL
ANION GAP: 12 (ref 5–15)
BUN: 5 mg/dL — ABNORMAL LOW (ref 6–20)
CO2: 22 mmol/L (ref 22–32)
Calcium: 8.1 mg/dL — ABNORMAL LOW (ref 8.9–10.3)
Chloride: 105 mmol/L (ref 101–111)
Creatinine, Ser: 0.82 mg/dL (ref 0.44–1.00)
GFR, EST NON AFRICAN AMERICAN: 59 mL/min — AB (ref >60)
GLUCOSE: 75 mg/dL (ref 65–99)
POTASSIUM: 3.5 mmol/L (ref 3.5–5.1)
Sodium: 139 mmol/L (ref 135–145)

## 2015-06-03 LAB — MAGNESIUM: Magnesium: 1.7 mg/dL (ref 1.7–2.4)

## 2015-06-03 LAB — GLUCOSE, CAPILLARY: Glucose-Capillary: 102 mg/dL — ABNORMAL HIGH (ref 65–99)

## 2015-06-03 MED ORDER — FUROSEMIDE 40 MG PO TABS
20.0000 mg | ORAL_TABLET | Freq: Once | ORAL | Status: AC
  Administered 2015-06-03: 20 mg via ORAL
  Filled 2015-06-03: qty 1

## 2015-06-03 MED ORDER — BOOST / RESOURCE BREEZE PO LIQD
1.0000 | Freq: Three times a day (TID) | ORAL | Status: DC
  Administered 2015-06-03 – 2015-06-05 (×4): 1 via ORAL

## 2015-06-03 MED ORDER — POTASSIUM CHLORIDE IN NACL 40-0.9 MEQ/L-% IV SOLN
INTRAVENOUS | Status: AC
  Administered 2015-06-03 – 2015-06-04 (×2): 50 mL/h via INTRAVENOUS
  Filled 2015-06-03 (×2): qty 1000

## 2015-06-03 MED ORDER — AMLODIPINE BESYLATE 10 MG PO TABS
10.0000 mg | ORAL_TABLET | Freq: Every day | ORAL | Status: DC
  Administered 2015-06-03 – 2015-06-05 (×3): 10 mg via ORAL
  Filled 2015-06-03 (×3): qty 1

## 2015-06-03 MED ORDER — AZITHROMYCIN 500 MG PO TABS
250.0000 mg | ORAL_TABLET | Freq: Every day | ORAL | Status: DC
  Administered 2015-06-03 – 2015-06-05 (×3): 250 mg via ORAL
  Filled 2015-06-03 (×3): qty 1

## 2015-06-03 NOTE — Care Management Important Message (Signed)
Important Message  Patient Details  Name: Taylor Watson MRN: [id number] Date of Birth: 01/31/1919   Medicare Important Message Given:  Yes    Brittnay Pigman P [place] 06/03/2015, 3:13 PM

## 2015-06-03 NOTE — Progress Notes (Signed)
Subjective: No complaints.  No reports of hematochezia.  Objective: Vital signs in last 24 hours: Temp:  [98.4 F (36.9 C)-98.9 F (37.2 C)] 98.9 F (37.2 C) (01/31 0723) Pulse Rate:  [76-128] 98 (01/31 0723) Resp:  [12-25] 14 (01/31 0723) BP: (92-159)/(45-66) 152/59 mmHg (01/31 0723) SpO2:  [92 %-100 %] 98 % (01/31 0723) Last BM Date: 06/03/15  Intake/Output from previous day: 01/30 0701 - 01/31 0700 In: 1553.8 [I.V.:1328.8; IV Piggyback:225] Out: 1925 [Urine:1450; Stool:475] Intake/Output this shift: Total I/O In: -  Out: 300 [Urine:300]  General appearance: alert and no distress GI: soft, non-tender; bowel sounds normal; no masses,  no organomegaly  Lab Results:  Recent Labs  06/02/15 1011 06/02/15 1630 06/03/15 0035  WBC 7.8 6.8 7.3  HGB 7.9* 7.7* 8.1*  HCT 23.2* 23.0* 23.4*  PLT 120* 131* 140*   BMET  Recent Labs  06/01/15 1250 06/02/15 1011 06/03/15 0328  NA 137 138 139  K 3.6 3.5 3.5  CL 111 108 105  CO2 21* 23 22  GLUCOSE 92 92 75  BUN 13 8 5*  CREATININE 0.84 0.83 0.82  CALCIUM 7.6* 8.0* 8.1*   LFT No results for input(s): PROT, ALBUMIN, AST, ALT, ALKPHOS, BILITOT, BILIDIR, IBILI in the last 72 hours. PT/INR  Recent Labs  06/01/15 0252  LABPROT 17.1*  INR 1.38   Hepatitis Panel No results for input(s): HEPBSAG, HCVAB, HEPAIGM, HEPBIGM in the last 72 hours. C-Diff No results for input(s): CDIFFTOX in the last 72 hours. Fecal Lactopherrin No results for input(s): FECLLACTOFRN in the last 72 hours.  Studies/Results: Ir Angiogram Visceral Selective  06/01/2015  INDICATION: Acute lower GI bleeding originating in the cecum. EXAM: SELECTIVE VISCERAL ARTERIOGRAPHY; IR ULTRASOUND GUIDANCE VASC ACCESS RIGHT MEDICATIONS: 1% lidocaine locally. ANESTHESIA/SEDATION: Fentanyl 25 mcg IV; Versed 1.5 mg IV Moderate Sedation Time:  30 minutes The patient was continuously monitored during the procedure by the interventional radiology nurse under my  direct supervision. CONTRAST:  93mL OMNIPAQUE IOHEXOL 300 MG/ML  SOLN FLUOROSCOPY TIME:  Fluoroscopy Time: 7 minutes 48 seconds (241 mGy). COMPLICATIONS: None immediate. PROCEDURE: Informed consent was obtained from the patient following explanation of the procedure, risks, benefits and alternatives. The patient understands, agrees and consents for the procedure. All questions were addressed. A time out was performed prior to the initiation of the procedure. Maximal barrier sterile technique utilized including caps, mask, sterile gowns, sterile gloves, large sterile drape, hand hygiene, and Betadine prep. Under sterile conditions and local anesthesia, right common femoral artery ultrasound micropuncture access performed. Images obtained for documentation. Five French sheath inserted. Initially a C2 catheter was advanced into the aorta. Attempts were made to cannulate the SMA with a C2 catheter however this was unsuccessful. Catheter was exchanged for a Sos Omni select catheter. This catheter was reformed in the aorta and utilized to select the SMA. SMA angiograms performed in the frontal and oblique projections. SMA: Atherosclerotic changes of the SMA origin and trunk. SMA trunk is widely patent. Jejunal and colic branches are patent. 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 No evidence of active contrast extravasation or active bleeding in the right colon. On the venous phase, the superior mesenteric vein and portal vein are both patent. Catheter access removed. Hemostasis obtained with manual compression. No immediate complication. Patient tolerated the procedure well. IMPRESSION: Negative for active mesenteric bleeding in the right colon. Electronically Signed   By: Jerilynn Mages.  Shick M.D.   On: 06/01/2015 12:28   Ir  US Guide Vasc Access Right  06/01/2015  INDICATION: Acute lower GI bleeding originating in the cecum. EXAM: SELECTIVE VISCERAL ARTERIOGRAPHY; IR  ULTRASOUND GUIDANCE VASC ACCESS RIGHT MEDICATIONS: 1% lidocaine locally. ANESTHESIA/SEDATION: Fentanyl 25 mcg IV; Versed 1.5 mg IV Moderate Sedation Time:  30 minutes The patient was continuously monitored during the procedure by the interventional radiology nurse under my direct supervision. CONTRAST:  65mL OMNIPAQUE IOHEXOL 300 MG/ML  SOLN FLUOROSCOPY TIME:  Fluoroscopy Time: 7 minutes 48 seconds (241 mGy). COMPLICATIONS: None immediate. PROCEDURE: Informed consent was obtained from the patient following explanation of the procedure, risks, benefits and alternatives. The patient understands, agrees and consents for the procedure. All questions were addressed. A time out was performed prior to the initiation of the procedure. Maximal barrier sterile technique utilized including caps, mask, sterile gowns, sterile gloves, large sterile drape, hand hygiene, and Betadine prep. Under sterile conditions and local anesthesia, right common femoral artery ultrasound micropuncture access performed. Images obtained for documentation. Five French sheath inserted. Initially a C2 catheter was advanced into the aorta. Attempts were made to cannulate the SMA with a C2 catheter however this was unsuccessful. Catheter was exchanged for a Sos Omni select catheter. This catheter was reformed in the aorta and utilized to select the SMA. SMA angiograms performed in the frontal and oblique projections. SMA: Atherosclerotic changes of the SMA origin and trunk. SMA trunk is widely patent. Jejunal and colic branches are patent. 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 No evidence of active contrast extravasation or active bleeding in the right colon. On the venous phase, the superior mesenteric vein and portal vein are both patent. Catheter access removed. Hemostasis obtained with manual compression. No immediate complication. Patient tolerated the procedure well. IMPRESSION:  Negative for active mesenteric bleeding in the right colon. Electronically Signed   By: Jerilynn Mages.  Shick M.D.   On: 06/01/2015 12:28    Medications:  Scheduled: . azithromycin  250 mg Intravenous Q24H  . cefTRIAXone (ROCEPHIN)  IV  1 g Intravenous Q24H  . escitalopram  10 mg Oral Daily  . furosemide  20 mg Oral Once  . pantoprazole (PROTONIX) IV  40 mg Intravenous Q12H  . sodium chloride flush  3 mL Intravenous Q12H   Continuous: . 0.9 % NaCl with KCl 40 mEq / L 50 mL/hr (06/03/15 0622)    Assessment/Plan: 1) Hematochezia - Diverticular bleed versus a cecal mass bleed.  2) Anemia - Stable.   She remains stable.  No reports of any further bleeding.  The RBC scan suggests a right sided lesion, but no active bleeding in that site with further interrogation.  50% of all diverticular bleeds originate from the right colon.  ? Cecal polypoid lesion.  I agree with Dr. Collene Mares that she is not the best candidate for a colonoscopy.  Plan: 1) Follow HGB and transfuse as necessary.   LOS: 3 days   Jerick Khachatryan D 06/03/2015, 7:28 AM

## 2015-06-03 NOTE — Progress Notes (Signed)
Patient being transferred to 5W. Report called to [facility]. All questions answered. Family at bedside notified of transfer/room.

## 2015-06-03 NOTE — Progress Notes (Signed)
Patient Demographics:    Taylor Watson, is a 82 y.o. female, DOB - December 11, 1918, [id number]  Admit date - 05/31/2015   Admitting Physician Ivor Costa, MD  Outpatient Primary MD for the patient is [name], MD  LOS - 3  Brief summary  This is a pleasant 82 year old African-American female with past medical history of hypertension, dyslipidemia, GERD, CVA, kidney stone, anemia of chronic disease who presented to the hospital with bright red blood per rectum 3 days ago. She had anemia due to blood loss and required 2 units of packed RBC transfusion, CT scan of the abdomen and pelvis was suspicious for a colonic mass, IR saw the patient and did an angiogram but could not locate active bleeding site. She was seen by GI and thought to be too frail for colonoscopy this was decided by both gastroenterologist at [facility] and also at [facility] where she initially presented.  I had discussion with patient's daughter and patient, both are agreeable to comfort directed treatment with gentle medications, they realized that she might not tolerate colonoscopy prep and eventually surgery, chemotherapy or radiation. I have requested palliative care to evaluate. Goal of care will be gentle medical treatment directed towards comfort. She is DO NOT RESUSCITATE.    No chief complaint on file.       Subjective:    [name] today has, No headache, No chest pain, No abdominal pain - No Nausea, No new weakness tingling or numbness, No Cough - SOB.    Assessment  & Plan :     1. Ongoing lower GI bleed with positive tagged RBC scan showing ascending colon bleed, blood loss rated anemia due to the same. Receive 2 units of packed RBC on 06/01/2015 with stable H&H which will be monitored, on PPI, GI on  board. IR performed an arteriogram on 06/01/2015 without any evidence of ongoing bleed.    She has had some unintentional weight loss and nonspecific CT abdomen pelvis findings questioning underlying malignancy. She was seen by GI here and at [place] decided that patient is too frail for colonoscopy prep, also finding malignancy might not change the eventual treatment of this patient as she appears to frail to tolerate surgery, chemotherapy or radiation. I had discussions with patient and her daughter both agree with it, palliative care will be involved of care will be gentle medical treatment elected towards comfort. Palliative care to decide the final plan today.   2. Mild underlying depression. On Lexapro at home continue.   3. Sepsis due to Upon admission. Elevated lactic acid. Due to blood loss and hypotension -  Clinically does not appear to have pneumonia, no cough, no shortness of breath, will taper down only to oral azithromycin at this time. Any sepsis physiology has completely resolved.   4.HTN - added Norvasc.    Code Status : DO NOT RESUSCITATE  Family Communication  : With patient's daughter over the phone.  Disposition Plan  : MedSurg  Consults  : GI, IR, palliative care  Procedures  :   Tagged RBC scan shows active bleeding at the ascending colon.  Arteriogram by IR for lower GI bleed on 06/01/2015   DVT Prophylaxis  :    SCDs  Lab Results  Component Value Date   PLT 140* 06/03/2015    Inpatient Medications  Scheduled Meds: . amLODipine  10 mg Oral Daily  . azithromycin  250 mg Intravenous Q24H  . cefTRIAXone (ROCEPHIN)  IV  1 g Intravenous Q24H  . escitalopram  10 mg Oral Daily  . feeding supplement  1 Container Oral TID BM  . pantoprazole (PROTONIX) IV  40 mg Intravenous Q12H  . sodium chloride flush  3 mL Intravenous Q12H   Continuous Infusions: . 0.9 % NaCl with KCl 40 mEq / L     PRN Meds:.acetaminophen **OR**  [DISCONTINUED] acetaminophen, dextromethorphan-guaiFENesin, ipratropium-albuterol, morphine injection, [DISCONTINUED] ondansetron **OR** ondansetron (ZOFRAN) IV  Antibiotics  :    Anti-infectives    Start     Dose/Rate Route Frequency Ordered Stop   06/02/15 0200  azithromycin (ZITHROMAX) 250 mg in dextrose 5 % 125 mL IVPB     250 mg 125 mL/hr over 60 Minutes Intravenous Every 24 hours 06/01/15 1017     06/02/15 0130  cefTRIAXone (ROCEPHIN) 1 g in dextrose 5 % 50 mL IVPB     1 g 100 mL/hr over 30 Minutes Intravenous Every 24 hours 06/01/15 1017     06/01/15 0130  cefTRIAXone (ROCEPHIN) 1 g in dextrose 5 % 50 mL IVPB  Status:  Discontinued     1 g 100 mL/hr over 30 Minutes Intravenous Every 24 hours 06/01/15 0120 06/01/15 1015   06/01/15 0130  azithromycin (ZITHROMAX) 500 mg in dextrose 5 % 250 mL IVPB  Status:  Discontinued     500 mg 250 mL/hr over 60 Minutes Intravenous Every 24 hours 06/01/15 0120 06/01/15 1015        Objective:   Filed Vitals:   06/03/15 0500 06/03/15 0600 06/03/15 0616 06/03/15 0723  BP: 135/54  139/57 152/59  Pulse: 98 128 108 98  Temp:    98.9 F (37.2 C)  TempSrc:    Oral  Resp: 15 25 23 14   Height:      Weight:      SpO2: 95% 92% 100% 98%    Wt Readings from Last 3 Encounters:  05/31/15 57.2 kg (126 lb 1.7 oz)  05/29/15 55.067 kg (121 lb 6.4 oz)  05/28/15 56.813 kg (125 lb 4 oz)     Intake/Output Summary (Last 24 hours) at 06/03/15 0915 Last data filed at 06/03/15 0723  Gross per 24 hour  Intake 1453.83 ml  Output   2225 ml  Net -771.17 ml     Physical Exam  Awake, Oriented X 2, No new F.N deficits, Normal affect [place].AT,PERRAL Supple Neck,No JVD, No cervical lymphadenopathy appriciated.  Symmetrical Chest wall movement, Good air movement bilaterally, CTAB RRR,No Gallops,Rubs or new Murmurs, No Parasternal Heave +ve B.Sounds, Abd Soft, No tenderness, No organomegaly appriciated, No rebound - guarding or rigidity. No Cyanosis,  Clubbing or edema, No new Rash or bruise       Data Review:   Micro Results Recent Results (from the past 240 hour(s))  MRSA PCR Screening     Status: None   Collection Time: 05/31/15 11:43 PM  Result Value Ref Range Status   MRSA by PCR NEGATIVE NEGATIVE Final    Comment:        The GeneXpert MRSA Assay (FDA approved for NASAL specimens only), is one component of a comprehensive MRSA colonization surveillance program. It is not intended to diagnose MRSA infection nor to guide or monitor treatment for MRSA infections.   Culture, blood (  x 2)     Status: None (Preliminary result)   Collection Time: 06/01/15  2:59 AM  Result Value Ref Range Status   Specimen Description BLOOD RIGHT HAND  Final   Special Requests BOTTLES DRAWN AEROBIC AND ANAEROBIC 5CC  Final   Culture NO GROWTH 1 DAY  Final   Report Status PENDING  Incomplete  Culture, blood (x 2)     Status: None (Preliminary result)   Collection Time: 06/01/15  4:31 AM  Result Value Ref Range Status   Specimen Description BLOOD RIGHT ARM  Final   Special Requests BOTTLES DRAWN AEROBIC AND ANAEROBIC 5CC  Final   Culture NO GROWTH 1 DAY  Final   Report Status PENDING  Incomplete    Radiology Reports Nm Gi Blood Loss  05/31/2015  CLINICAL DATA:  Patient with multiple episodes of blood within the stool. Week and fatigue. Low hemoglobin. EXAM: NUCLEAR MEDICINE GASTROINTESTINAL BLEEDING SCAN TECHNIQUE: Sequential abdominal images were obtained following intravenous administration of Tc-15m labeled red blood cells. RADIOPHARMACEUTICALS:  21.7 mCi Tc-48m in-vitro labeled red cells. COMPARISON:  None. FINDINGS: Radiotracer is demonstrated within the expected location of the cecum and ascending colon progressing distally into the transverse and proximal descending colon. Tracer is demonstrated within the vasculature as well as within the bladder. IMPRESSION: Positive bleeding examination, with origin likely within the region of the  cecum/proximal ascending colon. Critical Value/emergent results were called by telephone at the time of interpretation on 05/31/2015 at 7:08 pm to Dr. Abel Presto , who verbally acknowledged these results. Electronically Signed   By: Lovey Newcomer M.D.   On: 05/31/2015 19:09   Ct Abdomen Pelvis W Contrast  06/01/2015  CLINICAL DATA:  Colonic GI bleed EXAM: CT ABDOMEN AND PELVIS WITH CONTRAST TECHNIQUE: Multidetector CT imaging of the abdomen and pelvis was performed using the standard protocol following bolus administration of intravenous contrast. CONTRAST:  90mL OMNIPAQUE IOHEXOL 300 MG/ML  SOLN COMPARISON:  05/31/2015 FINDINGS: Lung bases demonstrate bibasilar atelectasis without sizable effusions. Gallbladder is been surgically removed. The liver demonstrates a 2.2 cmhepatic cyst in the left lobe stable from the prior exam. The spleen, adrenal glands and pancreas are within normal limits with the exception of some mild calcifications within the pancreas which are stable in appearance from the prior exam. The kidneys demonstrate a normal enhancement pattern. Prominent extrarenal pelves are noted bilaterally. These findings are stable from the prior exam. No obstructive changes are seen. Bladder is well distended. The colon demonstrates diffuse diverticular change without evidence of diverticulitis. The appendix is filled with barium. The ascending colon demonstrates diverticular change which may be the etiology of the patient's underlying hemorrhage. Additionally a filling defect is noted within the barium in the cecum in the possibility of an underlying polypoid lesion cannot be totally excluded. Diffuse aortoiliac calcifications are noted without aneurysmal dilatation. Calcified uterine fibroids are seen. The osseous structures show no acute abnormality. IMPRESSION: Filling defect within the cecum. This may be in part related incomplete distension although the possibility of a polypoid lesion would deserve  consideration given the recent hemorrhage. Diverticulosis without diverticulitis. Diverticular change may be the etiology of the patient's right colon hemorrhage. Chronic changes as described above. Electronically Signed   By: Inez Catalina M.D.   On: 06/01/2015 07:30   Ir Angiogram Visceral Selective  06/01/2015  INDICATION: Acute lower GI bleeding originating in the cecum. EXAM: SELECTIVE VISCERAL ARTERIOGRAPHY; IR ULTRASOUND GUIDANCE VASC ACCESS RIGHT MEDICATIONS: 1% lidocaine locally. ANESTHESIA/SEDATION: Fentanyl 25 mcg IV;  Versed 1.5 mg IV Moderate Sedation Time:  30 minutes The patient was continuously monitored during the procedure by the interventional radiology nurse under my direct supervision. CONTRAST:  83mL OMNIPAQUE IOHEXOL 300 MG/ML  SOLN FLUOROSCOPY TIME:  Fluoroscopy Time: 7 minutes 48 seconds (241 mGy). COMPLICATIONS: None immediate. PROCEDURE: Informed consent was obtained from the patient following explanation of the procedure, risks, benefits and alternatives. The patient understands, agrees and consents for the procedure. All questions were addressed. A time out was performed prior to the initiation of the procedure. Maximal barrier sterile technique utilized including caps, mask, sterile gowns, sterile gloves, large sterile drape, hand hygiene, and Betadine prep. Under sterile conditions and local anesthesia, right common femoral artery ultrasound micropuncture access performed. Images obtained for documentation. Five French sheath inserted. Initially a C2 catheter was advanced into the aorta. Attempts were made to cannulate the SMA with a C2 catheter however this was unsuccessful. Catheter was exchanged for a Sos Omni select catheter. This catheter was reformed in the aorta and utilized to select the SMA. SMA angiograms performed in the frontal and oblique projections. SMA: Atherosclerotic changes of the SMA origin and trunk. SMA trunk is widely patent. Jejunal and colic branches are  patent. 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 No evidence of active contrast extravasation or active bleeding in the right colon. On the venous phase, the superior mesenteric vein and portal vein are both patent. Catheter access removed. Hemostasis obtained with manual compression. No immediate complication. Patient tolerated the procedure well. IMPRESSION: Negative for active mesenteric bleeding in the right colon. Electronically Signed   By: Jerilynn Mages.  Shick M.D.   On: 06/01/2015 12:28   Ir US Guide Vasc Access Right  06/01/2015  INDICATION: Acute lower GI bleeding originating in the cecum. EXAM: SELECTIVE VISCERAL ARTERIOGRAPHY; IR ULTRASOUND GUIDANCE VASC ACCESS RIGHT MEDICATIONS: 1% lidocaine locally. ANESTHESIA/SEDATION: Fentanyl 25 mcg IV; Versed 1.5 mg IV Moderate Sedation Time:  30 minutes The patient was continuously monitored during the procedure by the interventional radiology nurse under my direct supervision. CONTRAST:  49mL OMNIPAQUE IOHEXOL 300 MG/ML  SOLN FLUOROSCOPY TIME:  Fluoroscopy Time: 7 minutes 48 seconds (241 mGy). COMPLICATIONS: None immediate. PROCEDURE: Informed consent was obtained from the patient following explanation of the procedure, risks, benefits and alternatives. The patient understands, agrees and consents for the procedure. All questions were addressed. A time out was performed prior to the initiation of the procedure. Maximal barrier sterile technique utilized including caps, mask, sterile gowns, sterile gloves, large sterile drape, hand hygiene, and Betadine prep. Under sterile conditions and local anesthesia, right common femoral artery ultrasound micropuncture access performed. Images obtained for documentation. Five French sheath inserted. Initially a C2 catheter was advanced into the aorta. Attempts were made to cannulate the SMA with a C2 catheter however this was unsuccessful. Catheter was exchanged for a Sos  Omni select catheter. This catheter was reformed in the aorta and utilized to select the SMA. SMA angiograms performed in the frontal and oblique projections. SMA: Atherosclerotic changes of the SMA origin and trunk. SMA trunk is widely patent. Jejunal and colic branches are patent. 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 No evidence of active contrast extravasation or active bleeding in the right colon. On the venous phase, the superior mesenteric vein and portal vein are both patent. Catheter access removed. Hemostasis obtained with manual compression. No immediate complication. Patient tolerated the procedure well. IMPRESSION: Negative for active mesenteric  bleeding in the right colon. Electronically Signed   By: Jerilynn Mages.  Shick M.D.   On: 06/01/2015 12:28   Dg Chest Port 1 View  06/03/2015  CLINICAL DATA:  Gastrointestinal bleeding.  Anemia. EXAM: PORTABLE CHEST 1 VIEW COMPARISON:  None. FINDINGS: Small left pleural effusion and basilar atelectasis have slightly worsened. Bochdalek's hernia on the right is noted. Right lung is clear. Heart size is upper normal. IMPRESSION: Some increase in a small left pleural effusion and basilar atelectasis. Electronically Signed   By: Inge Rise M.D.   On: 06/03/2015 08:16   Dg Chest Port 1 View  06/01/2015  CLINICAL DATA:  Gastrointestinal bleeding EXAM: PORTABLE CHEST 1 VIEW COMPARISON:  10/22/2013 FINDINGS: Heart is upper limits normal in size. Increasing bibasilar airspace opacities, likely atelectasis. Possible small left effusion. No acute bony abnormality. IMPRESSION: Increasing bibasilar opacities, likely atelectasis. Question small left effusion. Electronically Signed   By: Rolm Baptise M.D.   On: 06/01/2015 07:07     CBC  Recent Labs Lab 06/01/15 1832 06/02/15 0031 06/02/15 1011 06/02/15 1630 06/03/15 0035  WBC 11.0* 8.4 7.8 6.8 7.3  HGB 8.6* 8.6* 7.9* 7.7* 8.1*  HCT 28.0* 25.7* 23.2*  23.0* 23.4*  PLT 114* 115* 120* 131* 140*  MCV 82.6 83.7 85.0 84.9 85.7  MCH 25.4* 28.0 28.9 28.4 29.7  MCHC 30.7 33.5 34.1 33.5 34.6  RDW 14.1 14.5 14.8 14.9 15.0    Chemistries   Recent Labs Lab 05/28/15 1300 05/29/15 1703 05/30/15 0554 05/31/15 0254 06/01/15 1250 06/02/15 0608 06/02/15 1011 06/03/15 0328  NA 140 139 135 139 137  --  138 139  K 3.7 4.1 3.5 3.5 3.6  --  3.5 3.5  CL 101 105 106 110 111  --  108 105  CO2 32 29 25 26  21*  --  23 22  GLUCOSE 87 108* 114* 124* 92  --  92 75  BUN 8 18 19 20 13   --  8 5*  CREATININE 0.73 0.81 0.83 0.76 0.84  --  0.83 0.82  CALCIUM 9.4 9.0 7.7* 7.6* 7.6*  --  8.0* 8.1*  MG  --   --   --   --   --  1.5*  --  1.7  AST 10 14*  --   --   --   --   --   --   ALT 4 <5*  --   --   --   --   --   --   ALKPHOS 105 93  --   --   --   --   --   --   BILITOT 0.9 0.7  --   --   --   --   --   --    ------------------------------------------------------------------------------------------------------------------ No results for input(s): CHOL, HDL, LDLCALC, TRIG, CHOLHDL, LDLDIRECT in the last 72 hours.  Lab Results  Component Value Date   HGBA1C 5.7 10/04/2013   ------------------------------------------------------------------------------------------------------------------ No results for input(s): TSH, T4TOTAL, T3FREE, THYROIDAB in the last 72 hours.  Invalid input(s): FREET3 ------------------------------------------------------------------------------------------------------------------ No results for input(s): VITAMINB12, FOLATE, FERRITIN, TIBC, IRON, RETICCTPCT in the last 72 hours.  Coagulation profile  Recent Labs Lab 06/01/15 0252  INR 1.38    No results for input(s): DDIMER in the last 72 hours.  Cardiac Enzymes  Recent Labs Lab 05/29/15 2012  TROPONINI <0.03   ------------------------------------------------------------------------------------------------------------------ No results found for: BNP  Time  Spent in minutes  35   Lala Lund K M.D on 06/03/2015 at  9:15 AM  Between 7am to 7pm - Pager - [phone]  After 7pm go to [email or web address] - password [facility]  [facility] -  Office  [phone]

## 2015-06-03 NOTE — Consult Note (Signed)
Consultation Note Date: 06/03/2015   Patient Name: Taylor Watson  DOB: 1918/05/21  MRN: [id number]  Age / Sex: 82 y.o., female  PCP: [name], MD Referring Physician: Thurnell Lose, MD  Reason for Consultation: Establishing goals of care    Clinical Assessment/Narrative:  The patient is an 82 year old lady with a past medical history significant for hypertension, dyslipidemia, gastroesophageal reflux disease, history of stroke kidney stones anemia of chronic disease. The patient lives at home with her daughter. Patient was admitted to the hospital with complaints of bright red blood per rectum and acute blood loss anemia requiring blood transfusions. CT scan of the abdomen and pelvis was suspicious for colonic mass, possible diverticulosis. Patient was seen and evaluated by interventional radiology, angiogram was done, unable to locate active bleeding site. She was seen by gastroenterology and was thought to be too frail for colonoscopy.  A palliative care consultation has been requested for further goals of care discussions.  The patient is resting comfortably in bed. She is awake alert present oriented answers all questions appropriately. Patient's daughter Verita Lamb is at the bedside. Patient's other daughter with whom she lives is also present several grandchildren are also present. Discussed the patient's current hospitalization course. Patient's grandchildren have several questions about why the patient is not undergoing further workup. They're asking why she is not an appropriate candidate for colonoscopy. They're asking for detailed information about whether or not you're sure this is a cancer or not. They're asking what stage of cancer this could be. They're asking if there is any chemotherapy that the patient would be able to take.  Discussed extensively. All information provided. Discussed about the  patient's frailty, advanced age, multiple underlying conditions. Addressed goals of care. Discussed about how it would be medically appropriate to focus on Discussed about scope of palliative services with aggressive symptom management. Discussed about possibly addition of hospice as an extra layer of support. Patient's family members would like to request for home health care services, home physical therapy towards the end of this hospitalization. The do not want to proceed with hospice consultation.  Contacts/Participants in Discussion: Primary Decision Maker:     Relationship to Patient   HCPOA: yes   Daughter [name]  SUMMARY OF RECOMMENDATIONS: Continue current scope of treatment DO NOT RESUSCITATE Home with home health care, home physical therapy   Code Status/Advance Care Planning: DNR    Code Status Orders        Start     Ordered   06/01/15 0113  Do not attempt resuscitation (DNR)   Continuous    Question Answer Comment  In the event of cardiac or respiratory ARREST Do not call a "code blue"   In the event of cardiac or respiratory ARREST Do not perform Intubation, CPR, defibrillation or ACLS   In the event of cardiac or respiratory ARREST Use medication by any route, position, wound care, and other measures to relive pain and suffering. May use oxygen, suction and manual treatment of airway obstruction as needed for comfort.      06/01/15 0115    Code Status History    Date Active Date Inactive Code Status Order ID Comments User Context   05/31/2015  9:57 PM 06/01/2015  1:15 AM Full Code [id number]  Rhona Leavens, RN Inpatient   05/29/2015  9:40 PM 05/31/2015  9:57 PM DNR [id number]  Bettey Costa, MD Inpatient      Other Directives:Other  Symptom Management:    Continue current scope  of treatment  Palliative Prophylaxis:   Bowel Regimen  Additional Recommendations (Limitations, Scope, Preferences):  Home with home health care   Psycho-social/Spiritual:    Support System: [place] Desire for further Chaplaincy support:no Additional Recommendations: Caregiving  Support/Resources  Prognosis: ?weeks to months  Discharge Planning: Home with Home Health   Chief Complaint/ Primary Diagnoses: Present on Admission:  . GERD (gastroesophageal reflux disease) . Anxiety . Essential hypertension, benign . Rectal bleeding . Sepsis ([place]) . Blood loss anemia . Depression . Fever, unspecified  I have reviewed the medical record, interviewed the patient and family, and examined the patient. The following aspects are pertinent.  Past Medical History  Diagnosis Date  . Hypertension   . CVA (cerebral vascular accident) ([place])   . Anemia   . Hypercholesterolemia   . GERD (gastroesophageal reflux disease)   . Allergy   . Hx: UTI (urinary tract infection)   . History of blood transfusion   . History of kidney stones   . Depression    Social History   Social History  . Marital Status: Widowed    Spouse Name: N/A  . Number of Children: 5  . Years of Education: N/A   Social History Main Topics  . Smoking status: Former Research scientist (life sciences)  . Smokeless tobacco: Former Systems developer    Types: Chew  . Alcohol Use: No  . Drug Use: No  . Sexual Activity: Not Currently   Other Topics Concern  . None   Social History Narrative   Family History  Problem Relation Age of Onset  . Cancer Mother     unknown type  . Hypertension Mother   . Cancer Father     unknown type  . Breast cancer Daughter   . Colon cancer      grandfather   Scheduled Meds: . amLODipine  10 mg Oral Daily  . azithromycin  250 mg Oral Daily  . escitalopram  10 mg Oral Daily  . feeding supplement  1 Container Oral TID BM  . pantoprazole (PROTONIX) IV  40 mg Intravenous Q12H  . sodium chloride flush  3 mL Intravenous Q12H   Continuous Infusions: . 0.9 % NaCl with KCl 40 mEq / L 50 mL/hr (06/03/15 0938)   PRN Meds:.acetaminophen **OR** [DISCONTINUED] acetaminophen,  dextromethorphan-guaiFENesin, ipratropium-albuterol, morphine injection, [DISCONTINUED] ondansetron **OR** ondansetron (ZOFRAN) IV Medications Prior to Admission:  Prior to Admission medications   Medication Sig Start Date End Date Taking? Authorizing Provider  escitalopram (LEXAPRO) 10 MG tablet TAKE ONE TABLET BY MOUTH EVERY DAY 03/18/15  Yes [name], MD  omeprazole (PRILOSEC) 20 MG capsule TAKE ONE CAPSULE BY MOUTH EVERY DAY 02/06/15  Yes [name], MD   Allergies  Allergen Reactions  . Dyazide [Hydrochlorothiazide W-Triamterene]   . Monopril [Fosinopril]   . Toprol Xl [Metoprolol Tartrate]   . Verapamil     Review of Systems Positive for generalized weakness, no pain no nausea Physical Exam Frail elderly lady resting in bed. Patient is awake alert oriented answers questions appropriately she is a little hard of hearing Lungs clear to auscultation Regular rate and rhythm Abdomen soft nondistended nontender Extremities no edema Vital Signs: BP 107/44 mmHg  Pulse 104  Temp(Src) 98.5 F (36.9 C) (Oral)  Resp 23  Ht 5\' 5"  (1.651 m)  Wt 63.3 kg (139 lb 8.8 oz)  BMI 23.22 kg/m2  SpO2 90%  SpO2: SpO2: 90 % O2 Device:SpO2: 90 % O2 Flow Rate: .O2 Flow Rate (L/min): 3 L/min  IO: Intake/output summary:  Intake/Output Summary (Last 24 hours) at 06/03/15 1318 Last data filed at 06/03/15 1013  Gross per 24 hour  Intake 1843.83 ml  Output   2425 ml  Net -581.17 ml    LBM: Last BM Date: 06/03/15 Baseline Weight: Weight: 57.2 kg (126 lb 1.7 oz) Most recent weight: Weight: 63.3 kg (139 lb 8.8 oz)      Palliative Assessment/Data:  Flowsheet Rows        Most Recent Value   Intake Tab    Referral Department  Hospitalist   Unit at Time of Referral  Med/Surg Unit   Palliative Care Primary Diagnosis  Other (Comment)   Palliative Care Type  New Palliative care   Reason for referral  Clarify Goals of Care   Date first seen by Palliative Care  06/03/15   Clinical  Assessment    Palliative Performance Scale Score  30%   Pain Max last 24 hours  4   Pain Min Last 24 hours  3   Dyspnea Max Last 24 Hours  4   Dyspnea Min Last 24 hours  3   Psychosocial & Spiritual Assessment    Palliative Care Outcomes    Patient/Family meeting held?  Yes   Who was at the meeting?  2 daughters, grand sons, grand daughter    Palliative Care Outcomes  Clarified goals of care   Palliative Care follow-up planned  Yes, Facility      Additional Data Reviewed:  CBC:    Component Value Date/Time   WBC 7.3 06/03/2015 0035   WBC 4.8 11/06/2013 1239   WBC 4.3 09/10/2013 1504   HGB 8.1* 06/03/2015 0035   HGB 12.3 09/10/2013 1504   HCT 23.4* 06/03/2015 0035   HCT 40.0 09/10/2013 1504   PLT 140* 06/03/2015 0035   PLT 324 09/10/2013 1504   MCV 85.7 06/03/2015 0035   MCV 83 09/10/2013 1504   NEUTROABS 2.7 12/23/2014 1110   NEUTROABS 2 11/06/2013 1239   LYMPHSABS 1.7 12/23/2014 1110   MONOABS 0.5 12/23/2014 1110   EOSABS 0.5 12/23/2014 1110   BASOSABS 0.1 12/23/2014 1110   Comprehensive Metabolic Panel:    Component Value Date/Time   NA 139 06/03/2015 0328   NA 144 11/06/2013 1239   NA 138 09/10/2013 1504   K 3.5 06/03/2015 0328   K 3.2* 09/10/2013 1504   CL 105 06/03/2015 0328   CL 102 09/10/2013 1504   CO2 22 06/03/2015 0328   CO2 31 09/10/2013 1504   BUN 5* 06/03/2015 0328   BUN 15 11/06/2013 1239   BUN 8 09/10/2013 1504   CREATININE 0.82 06/03/2015 0328   CREATININE 0.8 11/06/2013 1239   CREATININE 0.95 09/10/2013 1504   GLUCOSE 75 06/03/2015 0328   GLUCOSE 96 09/10/2013 1504   CALCIUM 8.1* 06/03/2015 0328   CALCIUM 9.0 09/10/2013 1504   AST 14* 05/29/2015 1703   AST 20 09/20/2011 1255   ALT <5* 05/29/2015 1703   ALT 16 09/20/2011 1255   ALKPHOS 93 05/29/2015 1703   ALKPHOS 131 09/20/2011 1255   BILITOT 0.7 05/29/2015 1703   BILITOT 0.7 09/20/2011 1255   PROT 6.9 05/29/2015 1703   PROT 7.8 09/20/2011 1255   ALBUMIN 3.6 05/29/2015 1703    ALBUMIN 3.7 09/20/2011 1255     Time In: 12 Time Out: 1300 Time Total: 60 min  Greater than 50%  of this time was spent counseling and coordinating care related to the above assessment and plan.  Signed by: Loistine Chance, MD  [id number] Loistine Chance, MD  06/03/2015, 1:18 PM  Please contact Palliative Medicine Team phone at [phone] for questions and concerns.

## 2015-06-03 NOTE — Evaluation (Signed)
Occupational Therapy Evaluation Patient Details Name: Taylor Watson MRN: [id number] DOB: 15-Mar-1919 Today's Date: 06/03/2015    History of Present Illness 82 yo female admitted with GIB rectal bleeding with sepsis PMH: htn, cva, anemia,gerd,kidney stonev depression   Clinical Impression   PT admitted with GIB rectal bleeding. Pt currently with functional limitiations due to the deficits listed below (see OT problem list). PTA family (A) baseline and uses DME.  Pt will benefit from skilled OT to increase their independence and safety with adls and balance to allow discharge 3n1 and RW.     Follow Up Recommendations  Home health OT    Equipment Recommendations  3 in 1 bedside comode;Other (comment) (RW)    Recommendations for Other Services       Precautions / Restrictions Precautions Precautions: Fall Restrictions Weight Bearing Restrictions: No      Mobility Bed Mobility Overal bed mobility: Modified Independent             General bed mobility comments: required use of bed rail  Transfers Overall transfer level: Needs assistance Equipment used: Rolling walker (2 wheeled) Transfers: Sit to/from Stand Sit to Stand: Min assist         General transfer comment: cues for hand placement and (A) to manage lines    Balance Overall balance assessment: Needs assistance Sitting-balance support: Bilateral upper extremity supported;Feet supported Sitting balance-Leahy Scale: Fair     Standing balance support: Bilateral upper extremity supported;During functional activity Standing balance-Leahy Scale: Poor Standing balance comment: relies on RW for UE support.              High level balance activites: Direction changes;Backward walking;Turns;Sudden stops;Head turns High Level Balance Comments: Pt needed min assist and cues for challenges.  Definite need of RW.             ADL Overall ADL's : Needs assistance/impaired Eating/Feeding: Set  up;Sitting   Grooming: Wash/dry hands;Set up;Sitting                   Toilet Transfer: Minimal assistance;Stand-pivot (simulated to chair ( 3n1))           Functional mobility during ADLs: Minimal assistance;Rolling walker General ADL Comments: pt completed bed mobility to chair with grooming and eating task     Vision     Perception     Praxis      Pertinent Vitals/Pain Pain Assessment: No/denies pain     Hand Dominance Right   Extremity/Trunk Assessment Upper Extremity Assessment Upper Extremity Assessment: Overall WFL for tasks assessed   Lower Extremity Assessment Lower Extremity Assessment: Defer to PT evaluation   Cervical / Trunk Assessment Cervical / Trunk Assessment: Kyphotic   Communication Communication Communication: No difficulties   Cognition Arousal/Alertness: Awake/alert Behavior During Therapy: Flat affect Overall Cognitive Status: History of cognitive impairments - at baseline       Memory: Decreased short-term memory             General Comments       Exercises Exercises: General Lower Extremity     Shoulder Instructions      Home Living Family/patient expects to be discharged to:: Private residence Living Arrangements: Children Available Help at Discharge: Family Type of Home: House Home Access: Stairs to enter Technical brewer of Steps: 4 Entrance Stairs-Rails: Right Home Layout: One level     Bathroom Shower/Tub: Teacher, early years/pre: Standard Bathroom Accessibility: Yes   Home Equipment: Cane - quad  Prior Functioning/Environment Level of Independence: Independent with assistive device(s)  Gait / Transfers Assistance Needed: Ambulated with quad cane PTA. ADL's / Homemaking Assistance Needed: sponge bathed per daughter    Comments: Pt fell alot per daughter Taylor Watson who was in room.     OT Diagnosis: Generalized weakness;Cognitive deficits   OT Problem List: Decreased  strength;Decreased activity tolerance;Impaired balance (sitting and/or standing);Decreased cognition;Decreased safety awareness;Decreased knowledge of precautions;Decreased knowledge of use of DME or AE   OT Treatment/Interventions: Self-care/ADL training;Therapeutic exercise;DME and/or AE instruction;Therapeutic activities;Cognitive remediation/compensation    OT Goals(Current goals can be found in the care plan section) Acute Rehab OT Goals Patient Stated Goal: to return to her home OT Goal Formulation: Patient unable to participate in goal setting Time For Goal Achievement: 06/17/15 Potential to Achieve Goals: Good  OT Frequency: Min 2X/week   Barriers to D/C:            Co-evaluation              End of Session Equipment Utilized During Treatment: Gait belt;Rolling walker Nurse Communication: Mobility status;Precautions  Activity Tolerance: Patient tolerated treatment well Patient left: in chair;with call bell/phone within reach;with chair alarm set   Time: [phone] OT Time Calculation (min): 15 min Charges:  OT General Charges $OT Visit: 1 Procedure OT Evaluation $OT Eval Moderate Complexity: 1 Procedure G-Codes:    Parke Poisson B 24-Jun-2015, 11:02 AM   Jeri Modena   OTR/L PagerIP:[phone] Office: [phone] .

## 2015-06-03 NOTE — Evaluation (Signed)
Physical Therapy Evaluation Patient Details Name: Taylor Watson MRN: [id number] DOB: [birth year] Today's Date: 06/03/2015   History of Present Illness  82 yo female admitted with GIB rectal bleeding with sepsis PMH: htn, cva, anemia,gerd,kidney stonev depression  Clinical Impression  Pt admitted with above diagnosis. Pt currently with functional limitations due to the deficits listed below (see PT Problem List). Pt currently has balance issues but does do better with RW but will need min assist at home.  Daughter in room states that she is available to help pt at home.  Recommend 24 hour care and for pt to use RW at home as well as HHPT and [place] f/u.  Will follow acutely.   Pt will benefit from skilled PT to increase their independence and safety with mobility to allow discharge to the venue listed below.     Follow Up Recommendations Home health PT;Supervision/Assistance - 24 hour    Equipment Recommendations  Rolling walker with 5" wheels;3in1 (PT)    Recommendations for Other Services       Precautions / Restrictions Precautions Precautions: Fall Restrictions Weight Bearing Restrictions: No      Mobility  Bed Mobility               General bed mobility comments: Up in chair on arrival.   Transfers Overall transfer level: Needs assistance Equipment used: Rolling walker (2 wheeled) Transfers: Sit to/from Stand Sit to Stand: Min guard;Min assist         General transfer comment: generally unsteady.  Pt needed steadying assist upon standing.  Impulsive with movement.     Ambulation/Gait Ambulation/Gait assistance: Min assist;+2 safety/equipment Ambulation Distance (Feet): 35 Feet Assistive device: Rolling walker (2 wheeled) Gait Pattern/deviations: Step-through pattern;Decreased stride length;Shuffle;Staggering left;Staggering right;Trunk flexed   Gait velocity interpretation: Below normal speed for age/gender General Gait Details: Pt used RW for safety.   Still needed cues with RW as she was unsafe with turns and needed cues to stay close to RW.  Pt c/o dizziness therefore did not ambulate as far.  Needed min assist as pt was unsteady with gait.   Stairs            Wheelchair Mobility    Modified Rankin (Stroke Patients Only)       Balance Overall balance assessment: Needs assistance;History of Falls Sitting-balance support: No upper extremity supported;Feet supported Sitting balance-Leahy Scale: Fair     Standing balance support: Bilateral upper extremity supported;During functional activity Standing balance-Leahy Scale: Poor Standing balance comment: relies on RW for UE support.              High level balance activites: Direction changes;Backward walking;Turns;Sudden stops;Head turns High Level Balance Comments: Pt needed min assist and cues for challenges.  Definite need of RW.              Pertinent Vitals/Pain Pain Assessment: No/denies pain  HR 110-132 bpm, O2 sat down to 88% with ambulation on RA but unsure if reading accurately.  Otherwise O2 sat >90% on RA.  BP 98/59    Home Living Family/patient expects to be discharged to:: Private residence Living Arrangements: Children Available Help at Discharge: Family;Available 24 hours/day (daughter Taylor Watson states that pt has 24 hour care) Type of Home: House Home Access: Stairs to enter Entrance Stairs-Rails: Right Entrance Stairs-Number of Steps: 4 Home Layout: One level Home Equipment: Cane - quad      Prior Function Level of Independence: Independent with assistive device(s)   Gait / Transfers Assistance  Needed: Ambulated with quad cane PTA.  ADL's / Homemaking Assistance Needed: sponge bathed per daughter   Comments: Pt fell alot per daughter Taylor Watson who was in room.      Hand Dominance        Extremity/Trunk Assessment   Upper Extremity Assessment: Defer to OT evaluation           Lower Extremity Assessment: Generalized weakness       Cervical / Trunk Assessment: Kyphotic  Communication   Communication: No difficulties  Cognition Arousal/Alertness: Awake/alert Behavior During Therapy: Flat affect Overall Cognitive Status: History of cognitive impairments - at baseline       Memory: Decreased short-term memory              General Comments      Exercises General Exercises - Lower Extremity Ankle Circles/Pumps: AROM;Both;10 reps;Seated Quad Sets: AROM;Both;10 reps;Supine Long Arc Quad: AROM;Both;10 reps;Seated      Assessment/Plan    PT Assessment Patient needs continued PT services  PT Diagnosis Generalized weakness   PT Problem List Decreased activity tolerance;Decreased balance;Decreased mobility;Decreased knowledge of use of DME;Decreased safety awareness;Decreased knowledge of precautions;Decreased cognition  PT Treatment Interventions DME instruction;Gait training;Functional mobility training;Therapeutic activities;Therapeutic exercise;Balance training;Patient/family education   PT Goals (Current goals can be found in the Care Plan section) Acute Rehab PT Goals Patient Stated Goal: to go home PT Goal Formulation: With patient Time For Goal Achievement: 06/17/15 Potential to Achieve Goals: Good    Frequency Min 3X/week   Barriers to discharge        Co-evaluation               End of Session Equipment Utilized During Treatment: Gait belt Activity Tolerance: Patient limited by fatigue Patient left: in chair;with call bell/phone within reach;with chair alarm set;with family/visitor present Nurse Communication: Mobility status         Time: 1010-1024 PT Time Calculation (min) (ACUTE ONLY): 14 min   Charges:   PT Evaluation $PT Eval Moderate Complexity: 1 Procedure     PT G CodesIrwin Brakeman F 06/24/15, 10:51 AM  [facility] [phone] (pager)

## 2015-06-03 NOTE — Plan of Care (Signed)
Problem: Bowel/Gastric: Goal: Will show no signs and symptoms of gastrointestinal bleeding Outcome: Progressing Patient has had a decrease in bloody stools this evening.  Hgb at 8.1 at this time.

## 2015-06-04 DIAGNOSIS — K922 Gastrointestinal hemorrhage, unspecified: Principal | ICD-10-CM

## 2015-06-04 DIAGNOSIS — K625 Hemorrhage of anus and rectum: Secondary | ICD-10-CM

## 2015-06-04 DIAGNOSIS — R6510 Systemic inflammatory response syndrome (SIRS) of non-infectious origin without acute organ dysfunction: Secondary | ICD-10-CM

## 2015-06-04 DIAGNOSIS — I10 Essential (primary) hypertension: Secondary | ICD-10-CM

## 2015-06-04 DIAGNOSIS — Z515 Encounter for palliative care: Secondary | ICD-10-CM

## 2015-06-04 DIAGNOSIS — Z7189 Other specified counseling: Secondary | ICD-10-CM

## 2015-06-04 LAB — CBC
HCT: 23 % — ABNORMAL LOW (ref 36.0–46.0)
Hemoglobin: 7.9 g/dL — ABNORMAL LOW (ref 12.0–15.0)
MCH: 29.4 pg (ref 26.0–34.0)
MCHC: 34.3 g/dL (ref 30.0–36.0)
MCV: 85.5 fL (ref 78.0–100.0)
PLATELETS: 174 10*3/uL (ref 150–400)
RBC: 2.69 MIL/uL — AB (ref 3.87–5.11)
RDW: 15.4 % (ref 11.5–15.5)
WBC: 7.6 10*3/uL (ref 4.0–10.5)

## 2015-06-04 LAB — URINE CULTURE

## 2015-06-04 LAB — BASIC METABOLIC PANEL
ANION GAP: 5 (ref 5–15)
BUN: <5 mg/dL — ABNORMAL LOW (ref 6–20)
CALCIUM: 7.9 mg/dL — AB (ref 8.9–10.3)
CO2: 26 mmol/L (ref 22–32)
CREATININE: 0.77 mg/dL (ref 0.44–1.00)
Chloride: 107 mmol/L (ref 101–111)
Glucose, Bld: 100 mg/dL — ABNORMAL HIGH (ref 65–99)
Potassium: 3.3 mmol/L — ABNORMAL LOW (ref 3.5–5.1)
SODIUM: 138 mmol/L (ref 135–145)

## 2015-06-04 MED ORDER — HALOPERIDOL LACTATE 5 MG/ML IJ SOLN
1.0000 mg | Freq: Four times a day (QID) | INTRAMUSCULAR | Status: DC | PRN
  Administered 2015-06-04: 1 mg via INTRAVENOUS
  Filled 2015-06-04: qty 1

## 2015-06-04 MED ORDER — LORAZEPAM 2 MG/ML IJ SOLN
1.0000 mg | Freq: Once | INTRAMUSCULAR | Status: AC
  Administered 2015-06-04: 1 mg via INTRAVENOUS
  Filled 2015-06-04 (×2): qty 1

## 2015-06-04 NOTE — Progress Notes (Signed)
Physical Therapy Treatment Patient Details Name: Taylor Watson MRN: [id number] DOB: 03/04/1919 Today's Date: 06/04/2015    History of Present Illness 82 yo female admitted with GIB rectal bleeding with sepsis PMH: htn, cva, anemia,gerd,kidney stonev depression    PT Comments    Pt lethargic during todays session.  Pt's HGb trending down to 7.9 therefore OOB activity deferred and pt performed LE therapeutic exercise to improve strength in preparation for carryover with mobility in future sessions.    Follow Up Recommendations  Home health PT;Supervision/Assistance - 24 hour     Equipment Recommendations  Rolling walker with 5" wheels;3in1 (PT)    Recommendations for Other Services       Precautions / Restrictions Precautions Precautions: Fall Restrictions Weight Bearing Restrictions: No    Mobility  Bed Mobility Overal bed mobility:  (did not assess during intervention pt unable to follow commands to open eyes.  Remains lethargic and tx deferred to bed exercises.  )                Transfers Overall transfer level:  (not performed during todays intervention.  )                  Ambulation/Gait Ambulation/Gait assistance:  (not performed during todays intervention, pt lethargic during encounter not safe for ambulation today.  )               Stairs            Wheelchair Mobility    Modified Rankin (Stroke Patients Only)       Balance                                    Cognition Arousal/Alertness: Lethargic;Suspect due to medications Behavior During Therapy: Flat affect Overall Cognitive Status: History of cognitive impairments - at baseline       Memory: Decreased short-term memory              Exercises General Exercises - Lower Extremity Ankle Circles/Pumps: AROM;15 reps;Supine;Both Short Arc Quad: AROM;AAROM;Both;15 reps;Supine Heel Slides: AROM;AAROM;Both;15 reps;Supine Hip ABduction/ADduction:  AROM;AAROM;Both;15 reps;Supine Straight Leg Raises: AROM;AAROM;Both;15 reps;Supine Low Level/ICU Exercises Stabilized Bridging: AROM;AAROM;Both;5 reps (partial bridging req max VCs tactile guidance to complete.  )    General Comments        Pertinent Vitals/Pain Pain Assessment: No/denies pain    Home Living                      Prior Function            PT Goals (current goals can now be found in the care plan section) Acute Rehab PT Goals Patient Stated Goal: to sleep Potential to Achieve Goals: Good Progress towards PT goals: Not progressing toward goals - comment (Pt limited in progression of goals secondary to lethargy.  )    Frequency  Min 3X/week    PT Plan      Co-evaluation             End of Session   Activity Tolerance: Patient limited by fatigue;Patient limited by lethargy Patient left: in bed;with bed alarm set;with call bell/phone within reach     Time: 1000-1014 PT Time Calculation (min) (ACUTE ONLY): 14 min  Charges:  $Therapeutic Exercise: 8-22 mins                    G  Codes:      Cristela Blue 06/04/2015, 10:28 AM Governor Rooks, PTA pager [phone]

## 2015-06-04 NOTE — Care Management Note (Signed)
Case Management Note  Patient Details  Name: DELONDA DECLERCK MRN: [id number] Date of Birth: [birth year]  Subjective/Objective:                 Spoke with patient's daughter [name] over the phone as patient is confused. She confirmed that it is the family's intention to take patient back home at discharge. Together we identified discharge needs as HH PT OT RN HHA, and DME tub bench and BSC. Patient uses a walker at home. [facility] was chosen as [employer]   Action/Plan:  Referral made to [place], anticipate [place] orders closer to DC. Will request DME tub bench and BSC closer to discharge.   Expected Discharge Date:  06/03/15               Expected Discharge Plan:  [place]  In-House Referral:     Discharge planning Services  CM Consult  Post Acute Care Choice:  Durable Medical Equipment, Home Health Choice offered to:  Adult Children  DME Arranged:    DME Agency:     HH Arranged:    HH Agency:     Status of Service:  In process, will continue to follow  Medicare Important Message Given:  Yes Date Medicare IM Given:    Medicare IM give by:    Date Additional Medicare IM Given:    Additional Medicare Important Message give by:     If discussed at [place] of Stay Meetings, dates discussed:    Additional Comments:  Carles Collet, RN 06/04/2015, 2:51 PM

## 2015-06-04 NOTE — Progress Notes (Signed)
[facility] PROGRESS NOTE    Progress Note   Taylor Watson [id number] DOB: [birth year] DOA: 05/31/2015 PCP: Taylor Pheasant, MD   Brief Narrative:   Taylor Watson is an 82 y.o. female past medical history of hypertension CVA and chronic anemia who presents to the ED with bright red blood rectum that started 3 days prior to admission. CT scan of the abdomen and pelvis with suspicion for colonic mass she required 2 units of packed red blood cells., IR was consulted and 90 g was performed-could not locate the active bleeding site, GI relates she is too frail for colonoscopy this was decided by 2 independent Gastroenterologist  Assessment/Plan:   Ongoing lower GI bleed: She had a positive tagged red blood cell scan in the ascending colon. Received 2 units of pack red blood cells on 06/01/2015 MR hemoglobin continues to slowly drift down both upper and screen 2 additional units. Recheck hemoglobin tomorrow morning if continues to drift down can transfer her one unit. He is on a PPI GI was consulted and deemed her not a candidate for colonoscopy. IR perform arteriogram on 06/01/2015 without evidence of ongoing bleeding. 1 back to her history she has had unintentional weight loss she is 82 years old and she had mass seen by CT which is concerning for malignancy. If this is a malignancy I don't think the patient will tolerate surgery or chemotherapy due to her age and multiple comorbidities. Family does not want move towards comfort care.  Mild underlying depression: Continue Lexapro.  SIRS: This resolved with fluid resuscitation.  Encounter for palliative care Goals of care, counseling/discussion    DVT Prophylaxis - Lovenox ordered.  Family Communication: daughter Disposition Plan: Home in am Code Status:     Code Status Orders        Start     Ordered   06/01/15 0113  Do not attempt resuscitation (DNR)   Continuous    Question Answer Comment  In the event  of cardiac or respiratory ARREST Do not call a "code blue"   In the event of cardiac or respiratory ARREST Do not perform Intubation, CPR, defibrillation or ACLS   In the event of cardiac or respiratory ARREST Use medication by any route, position, wound care, and other measures to relive pain and suffering. May use oxygen, suction and manual treatment of airway obstruction as needed for comfort.      06/01/15 0115    Code Status History    Date Active Date Inactive Code Status Order ID Comments User Context   05/31/2015  9:57 PM 06/01/2015  1:15 AM Full Code [id number]  Rhona Leavens, RN Inpatient   05/29/2015  9:40 PM 05/31/2015  9:57 PM DNR [id number]  Bettey Costa, MD Inpatient        IV Access:    Peripheral IV   Procedures and diagnostic studies:   Dg Chest Port 1 View  06/27/2015  CLINICAL DATA:  Gastrointestinal bleeding.  Anemia. EXAM: PORTABLE CHEST 1 VIEW COMPARISON:  None. FINDINGS: Small left pleural effusion and basilar atelectasis have slightly worsened. Bochdalek's hernia on the right is noted. Right lung is clear. Heart size is upper normal. IMPRESSION: Some increase in a small left pleural effusion and basilar atelectasis. Electronically Signed   By: Inge Rise M.D.   On: 06-27-2015 08:16     Medical Consultants:    None.  Anti-Infectives:   Anti-infectives    Start     Dose/Rate Route Frequency Ordered Stop  06/03/15 1000  azithromycin (ZITHROMAX) tablet 250 mg     250 mg Oral Daily 06/03/15 0921     06/02/15 0200  azithromycin (ZITHROMAX) 250 mg in dextrose 5 % 125 mL IVPB  Status:  Discontinued     250 mg 125 mL/hr over 60 Minutes Intravenous Every 24 hours 06/01/15 1017 06/03/15 0921   06/02/15 0130  cefTRIAXone (ROCEPHIN) 1 g in dextrose 5 % 50 mL IVPB  Status:  Discontinued     1 g 100 mL/hr over 30 Minutes Intravenous Every 24 hours 06/01/15 1017 06/03/15 0921   06/01/15 0130  cefTRIAXone (ROCEPHIN) 1 g in dextrose 5 % 50 mL IVPB  Status:   Discontinued     1 g 100 mL/hr over 30 Minutes Intravenous Every 24 hours 06/01/15 0120 06/01/15 1015   06/01/15 0130  azithromycin (ZITHROMAX) 500 mg in dextrose 5 % 250 mL IVPB  Status:  Discontinued     500 mg 250 mL/hr over 60 Minutes Intravenous Every 24 hours 06/01/15 0120 06/01/15 1015      Subjective:    Taylor Watson T Ruggieri no complains  Objective:    Filed Vitals:   06/03/15 1213 06/03/15 1542 06/03/15 2143 06/04/15 0602  BP: 107/44 123/49 118/55 117/70  Pulse: 104 93 102 100  Temp: 98.5 F (36.9 C) 98.5 F (36.9 C) 98.1 F (36.7 C)   TempSrc: Oral  Oral   Resp: 23 23 18 18   Height: 5\' 5"  (1.651 m)     Weight: 63.3 kg (139 lb 8.8 oz)     SpO2: 90% 92% 95% 92%    Intake/Output Summary (Last 24 hours) at 06/04/15 1007 Last data filed at 06/04/15 0659  Gross per 24 hour  Intake 1479.17 ml  Output    440 ml  Net 1039.17 ml   Filed Weights   05/31/15 2331 06/03/15 1213  Weight: 57.2 kg (126 lb 1.7 oz) 63.3 kg (139 lb 8.8 oz)    Exam: Gen:  NAD Cardiovascular:  RRR. Chest and lungs:   CTAB Abdomen:  Abdomen soft, NT/ND, + BS Extremities:  No edema   Data Reviewed:    Labs: Basic Metabolic Panel:  Recent Labs Lab 05/31/15 0254 06/01/15 1250 06/02/15 0608 06/02/15 1011 06/03/15 0328 06/04/15 0445  NA 139 137  --  138 139 138  K 3.5 3.6  --  3.5 3.5 3.3*  CL 110 111  --  108 105 107  CO2 26 21*  --  23 22 26   GLUCOSE 124* 92  --  92 75 100*  BUN 20 13  --  8 5* <5*  CREATININE 0.76 0.84  --  0.83 0.82 0.77  CALCIUM 7.6* 7.6*  --  8.0* 8.1* 7.9*  MG  --   --  1.5*  --  1.7  --    GFR Estimated Creatinine Clearance: 38.7 mL/min (by C-G formula based on Cr of 0.77). Liver Function Tests:  Recent Labs Lab 05/28/15 1300 05/29/15 1703  AST 10 14*  ALT 4 <5*  ALKPHOS 105 93  BILITOT 0.9 0.7  PROT 7.0 6.9  ALBUMIN 3.9 3.6   No results for input(s): LIPASE, AMYLASE in the last 168 hours. No results for input(s): AMMONIA in the last 168  hours. Coagulation profile  Recent Labs Lab 06/01/15 0252  INR 1.38    CBC:  Recent Labs Lab 06/02/15 1011 06/02/15 1630 06/03/15 0035 06/03/15 1646 06/04/15 0445  WBC 7.8 6.8 7.3 9.9 7.6  HGB 7.9* 7.7* 8.1*  8.7* 7.9*  HCT 23.2* 23.0* 23.4* 26.2* 23.0*  MCV 85.0 84.9 85.7 85.3 85.5  PLT 120* 131* 140* 188 174   Cardiac Enzymes:  Recent Labs Lab 05/29/15 2012  TROPONINI <0.03   BNP (last 3 results) No results for input(s): PROBNP in the last 8760 hours. CBG:  Recent Labs Lab 06/01/15 0824 06/03/15 1715  GLUCAP 88 102*   D-Dimer: No results for input(s): DDIMER in the last 72 hours. Hgb A1c: No results for input(s): HGBA1C in the last 72 hours. Lipid Profile: No results for input(s): CHOL, HDL, LDLCALC, TRIG, CHOLHDL, LDLDIRECT in the last 72 hours. Thyroid function studies: No results for input(s): TSH, T4TOTAL, T3FREE, THYROIDAB in the last 72 hours.  Invalid input(s): FREET3 Anemia work up: No results for input(s): VITAMINB12, FOLATE, FERRITIN, TIBC, IRON, RETICCTPCT in the last 72 hours. Sepsis Labs:  Recent Labs Lab 06/01/15 0252 06/01/15 0257 06/01/15 0431  06/02/15 1630 06/03/15 0035 06/03/15 1646 06/04/15 0445  PROCALCITON <0.10  --   --   --   --   --   --   --   WBC  --   --   --   < > 6.8 7.3 9.9 7.6  LATICACIDVEN  --  2.4* 2.4*  --   --   --   --   --   < > = values in this interval not displayed. Microbiology Recent Results (from the past 240 hour(s))  MRSA PCR Screening     Status: None   Collection Time: 05/31/15 11:43 PM  Result Value Ref Range Status   MRSA by PCR NEGATIVE NEGATIVE Final    Comment:        The GeneXpert MRSA Assay (FDA approved for NASAL specimens only), is one component of a comprehensive MRSA colonization surveillance program. It is not intended to diagnose MRSA infection nor to guide or monitor treatment for MRSA infections.   Culture, blood (x 2)     Status: None (Preliminary result)   Collection  Time: 06/01/15  2:59 AM  Result Value Ref Range Status   Specimen Description BLOOD RIGHT HAND  Final   Special Requests BOTTLES DRAWN AEROBIC AND ANAEROBIC 5CC  Final   Culture NO GROWTH 2 DAYS  Final   Report Status PENDING  Incomplete  Culture, blood (x 2)     Status: None (Preliminary result)   Collection Time: 06/01/15  4:31 AM  Result Value Ref Range Status   Specimen Description BLOOD RIGHT ARM  Final   Special Requests BOTTLES DRAWN AEROBIC AND ANAEROBIC 5CC  Final   Culture NO GROWTH 2 DAYS  Final   Report Status PENDING  Incomplete  Urine culture     Status: None (Preliminary result)   Collection Time: 06/02/15  3:09 PM  Result Value Ref Range Status   Specimen Description URINE, RANDOM  Final   Special Requests NONE  Final   Culture TOO YOUNG TO READ  Final   Report Status PENDING  Incomplete     Medications:   . amLODipine  10 mg Oral Daily  . azithromycin  250 mg Oral Daily  . escitalopram  10 mg Oral Daily  . feeding supplement  1 Container Oral TID BM  . pantoprazole (PROTONIX) IV  40 mg Intravenous Q12H  . sodium chloride flush  3 mL Intravenous Q12H   Continuous Infusions:   Time spent: 25 min   LOS: 4 days   Charlynne Cousins  [facility] Pager [phone]  *Please  refer to [email or web address], password [email or web address] to get updated schedule on who will round on this patient, as hospitalists switch teams weekly. If 7PM-7AM, please contact night-coverage at [email or web address], password [email or web address] for any overnight needs.  06/04/2015, 10:07 AM

## 2015-06-05 ENCOUNTER — Telehealth: Payer: Self-pay | Admitting: Internal Medicine

## 2015-06-05 DIAGNOSIS — Z7689 Persons encountering health services in other specified circumstances: Secondary | ICD-10-CM

## 2015-06-05 LAB — CBC
HEMATOCRIT: 27.4 % — AB (ref 36.0–46.0)
Hemoglobin: 9.1 g/dL — ABNORMAL LOW (ref 12.0–15.0)
MCH: 29.2 pg (ref 26.0–34.0)
MCHC: 33.2 g/dL (ref 30.0–36.0)
MCV: 87.8 fL (ref 78.0–100.0)
PLATELETS: 236 10*3/uL (ref 150–400)
RBC: 3.12 MIL/uL — ABNORMAL LOW (ref 3.87–5.11)
RDW: 16.1 % — AB (ref 11.5–15.5)
WBC: 9.1 10*3/uL (ref 4.0–10.5)

## 2015-06-05 MED ORDER — PANTOPRAZOLE SODIUM 40 MG PO TBEC: 40.0000 mg | DELAYED_RELEASE_TABLET | Freq: Two times a day (BID) | ORAL | Status: DC

## 2015-06-05 NOTE — Telephone Encounter (Signed)
Pt Taylor Watson daughter called wanting to speak to Dr Nicki Reaper regarding pt having to be taken to the ED with bleeding in her stool. Daughter states they said she has Colon cancer. Pt was admitted into the hospital. Daughter would like to have FMLA paper work filled out so she can take her mom to the doctors and be able to care for her. Call daughter @ [phone] leave mess. Daughter can drop form off. Thank You!

## 2015-06-05 NOTE — Progress Notes (Signed)
Taylor Watson could not provide services to patient based on location, referral made to [facility] per choice daughter [name].

## 2015-06-05 NOTE — Telephone Encounter (Signed)
Placed in red folder  

## 2015-06-05 NOTE — Care Management Note (Signed)
Case Management Note  Patient Details  Name: Taylor Watson MRN: [id number] Date of Birth: 1918/06/29  Subjective/Objective:                 Admitted from home in care of daughter. Declined dispo to SNF.   Action/Plan:  HH arranged through [facility], DME as well, referral also placed to [facility] for palliative to follow. Daughter [name] in agreement with plan.  Expected Discharge Date:  06/03/15               Expected Discharge Plan:  [place]  In-House Referral:     Discharge planning Services  CM Consult  Post Acute Care Choice:  Durable Medical Equipment, Home Health Choice offered to:  Adult Children  DME Arranged:  Hospital bed, Tub bench, 3-N-1 DME Agency:  [place]:  RN, PT, OT, Nurse's Aide, Social Work [employer] Agency:  [place]  Status of Service:  Completed, signed off  Medicare Important Message Given:  Yes Date Medicare IM Given:    Medicare IM give by:    Date Additional Medicare IM Given:    Additional Medicare Important Message give by:     If discussed at [place] of Stay Meetings, dates discussed:    Additional Comments:  Carles Collet, RN 06/05/2015, 12:05 PM

## 2015-06-05 NOTE — Discharge Summary (Signed)
Physician Discharge Summary  Taylor Watson [id number] DOB: 20-Apr-1919 DOA: 05/31/2015  PCP: [name], MD  Admit date: 05/31/2015 Discharge date: 06/05/2015  Time spent: 35 minutes  Recommendations for Outpatient Follow-up:  1. Palliative care to follow-up at home. 2. Follow-up with PCP in one week check a CBC.  Discharge Diagnoses:  Principal Problem:   Rectal bleeding Active Problems:   Anxiety   Essential hypertension, benign   GERD (gastroesophageal reflux disease)   Sepsis (HCC)   Blood loss anemia   Depression   Fever, unspecified   Lower GI bleed   Encounter for palliative care   Goals of care, counseling/discussion   Discharge Condition: Guarded  Diet recommendation: Regular  Filed Weights   05/31/15 2331 06/03/15 1213  Weight: 57.2 kg (126 lb 1.7 oz) 63.3 kg (139 lb 8.8 oz)    History of present illness:  A 82 year old with past history of hypertension that was admitted to [facility] for painless rectal bleeding on 05/29/2015 then transferred to come. [facility] was consulted who deemed a poor candidate for colonoscopy due to her advantageous CT scan of the abdomen was done that showed a mass with positive FOBT.  Hospital Course:  Lower GI bleed: She has a history of unintentional weight lost, with a CT scan of the abdomen and pelvis concerning for colonic malignancy. To GI doctor is deemed a poor candidate for colonoscopy. She has a positive red tach cell scan that was positive in the ascending colon. The patient received 2 units of packed red blood cells. Anemia hemoglobin has been ranging from 8.1-7.8. She was started on a PPI GI here: Was consulted who D did not candidate for colonoscopy. MRI perform arteriogram 06/01/2015 that showed no evidence of bleeding. Palliative care was consulted and the family did not want to move towards comfort care. I personally discussed with the family extensively the situation, and they seem to understand this  situation. They know she cannot tolerate chemotherapy due to her age and malnutrition. Will like to take her home and palliative care to follow-up home.  Mild underlying depression: Continue Lexapro.  SIRS: Resolved with IV fluid resuscitation.  Encounter for palliative care: Goals of care and counseling were performed.  Procedures:  CT scan of the abdomen and pelvis on 06/01/2015, chest x-ray 06/03/2015  Red blood cell tagged scan on 05/23/2015  Arteriogram on 06/01/2015  Consultations:  IR  GI  Discharge Exam: Filed Vitals:   06/04/15 2116 06/05/15 0549  BP: 123/50 124/51  Pulse: 80 88  Temp: 98.5 F (36.9 C) 98.9 F (37.2 C)  Resp: 18 16    General: A&O x2 Cardiovascular: RRR Respiratory: good air movement CTA B/L  Discharge Instructions   Discharge Instructions    Diet - low sodium heart healthy    Complete by:  As directed      Increase activity slowly    Complete by:  As directed           Current Discharge Medication List    CONTINUE these medications which have NOT CHANGED   Details  escitalopram (LEXAPRO) 10 MG tablet TAKE ONE TABLET BY MOUTH EVERY DAY Qty: 30 tablet, Refills: 2    omeprazole (PRILOSEC) 20 MG capsule TAKE ONE CAPSULE BY MOUTH EVERY DAY Qty: 30 capsule, Refills: 5       Allergies  Allergen Reactions  . Dyazide [Hydrochlorothiazide W-Triamterene]   . Monopril [Fosinopril]   . Toprol Xl [Metoprolol Tartrate]   . Verapamil    Follow-up  Information    Follow up with [facility].   Why:  PT OT Nurse, Aide, Social Worker   Contact information:   [address] [place] [address] [phone]       Follow up with [facility] at [facility].   Specialty:  Hospice and Palliative Medicine   Why:  For Palliative consult   Contact information:   [place] [address] [phone]        The results of significant diagnostics from this hospitalization (including imaging,  microbiology, ancillary and laboratory) are listed below for reference.    Significant Diagnostic Studies: Nm Gi Blood Loss  05/31/2015  CLINICAL DATA:  Patient with multiple episodes of blood within the stool. Week and fatigue. Low hemoglobin. EXAM: NUCLEAR MEDICINE GASTROINTESTINAL BLEEDING SCAN TECHNIQUE: Sequential abdominal images were obtained following intravenous administration of Tc-70m labeled red blood cells. RADIOPHARMACEUTICALS:  21.7 mCi Tc-22m in-vitro labeled red cells. COMPARISON:  None. FINDINGS: Radiotracer is demonstrated within the expected location of the cecum and ascending colon progressing distally into the transverse and proximal descending colon. Tracer is demonstrated within the vasculature as well as within the bladder. IMPRESSION: Positive bleeding examination, with origin likely within the region of the cecum/proximal ascending colon. Critical Value/emergent results were called by telephone at the time of interpretation on 05/31/2015 at 7:08 pm to Dr. Abel Presto , who verbally acknowledged these results. Electronically Signed   By: Lovey Newcomer M.D.   On: 05/31/2015 19:09   Ct Abdomen Pelvis W Contrast  06/01/2015  CLINICAL DATA:  Colonic GI bleed EXAM: CT ABDOMEN AND PELVIS WITH CONTRAST TECHNIQUE: Multidetector CT imaging of the abdomen and pelvis was performed using the standard protocol following bolus administration of intravenous contrast. CONTRAST:  49mL OMNIPAQUE IOHEXOL 300 MG/ML  SOLN COMPARISON:  05/31/2015 FINDINGS: Lung bases demonstrate bibasilar atelectasis without sizable effusions. Gallbladder is been surgically removed. The liver demonstrates a 2.2 cmhepatic cyst in the left lobe stable from the prior exam. The spleen, adrenal glands and pancreas are within normal limits with the exception of some mild calcifications within the pancreas which are stable in appearance from the prior exam. The kidneys demonstrate a normal enhancement pattern. Prominent  extrarenal pelves are noted bilaterally. These findings are stable from the prior exam. No obstructive changes are seen. Bladder is well distended. The colon demonstrates diffuse diverticular change without evidence of diverticulitis. The appendix is filled with barium. The ascending colon demonstrates diverticular change which may be the etiology of the patient's underlying hemorrhage. Additionally a filling defect is noted within the barium in the cecum in the possibility of an underlying polypoid lesion cannot be totally excluded. Diffuse aortoiliac calcifications are noted without aneurysmal dilatation. Calcified uterine fibroids are seen. The osseous structures show no acute abnormality. IMPRESSION: Filling defect within the cecum. This may be in part related incomplete distension although the possibility of a polypoid lesion would deserve consideration given the recent hemorrhage. Diverticulosis without diverticulitis. Diverticular change may be the etiology of the patient's right colon hemorrhage. Chronic changes as described above. Electronically Signed   By: Inez Catalina M.D.   On: 06/01/2015 07:30   Ir Angiogram Visceral Selective  06/01/2015  INDICATION: Acute lower GI bleeding originating in the cecum. EXAM: SELECTIVE VISCERAL ARTERIOGRAPHY; IR ULTRASOUND GUIDANCE VASC ACCESS RIGHT MEDICATIONS: 1% lidocaine locally. ANESTHESIA/SEDATION: Fentanyl 25 mcg IV; Versed 1.5 mg IV Moderate Sedation Time:  30 minutes The patient was continuously monitored during the procedure by the interventional radiology nurse  under my direct supervision. CONTRAST:  22mL OMNIPAQUE IOHEXOL 300 MG/ML  SOLN FLUOROSCOPY TIME:  Fluoroscopy Time: 7 minutes 48 seconds (241 mGy). COMPLICATIONS: None immediate. PROCEDURE: Informed consent was obtained from the patient following explanation of the procedure, risks, benefits and alternatives. The patient understands, agrees and consents for the procedure. All questions were addressed.  A time out was performed prior to the initiation of the procedure. Maximal barrier sterile technique utilized including caps, mask, sterile gowns, sterile gloves, large sterile drape, hand hygiene, and Betadine prep. Under sterile conditions and local anesthesia, right common femoral artery ultrasound micropuncture access performed. Images obtained for documentation. Five French sheath inserted. Initially a C2 catheter was advanced into the aorta. Attempts were made to cannulate the SMA with a C2 catheter however this was unsuccessful. Catheter was exchanged for a Sos Omni select catheter. This catheter was reformed in the aorta and utilized to select the SMA. SMA angiograms performed in the frontal and oblique projections. SMA: Atherosclerotic changes of the SMA origin and trunk. SMA trunk is widely patent. Jejunal and colic branches are patent. 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 No evidence of active contrast extravasation or active bleeding in the right colon. On the venous phase, the superior mesenteric vein and portal vein are both patent. Catheter access removed. Hemostasis obtained with manual compression. No immediate complication. Patient tolerated the procedure well. IMPRESSION: Negative for active mesenteric bleeding in the right colon. Electronically Signed   By: Jerilynn Mages.  Shick M.D.   On: 06/01/2015 12:28   Ir US Guide Vasc Access Right  06/01/2015  INDICATION: Acute lower GI bleeding originating in the cecum. EXAM: SELECTIVE VISCERAL ARTERIOGRAPHY; IR ULTRASOUND GUIDANCE VASC ACCESS RIGHT MEDICATIONS: 1% lidocaine locally. ANESTHESIA/SEDATION: Fentanyl 25 mcg IV; Versed 1.5 mg IV Moderate Sedation Time:  30 minutes The patient was continuously monitored during the procedure by the interventional radiology nurse under my direct supervision. CONTRAST:  50mL OMNIPAQUE IOHEXOL 300 MG/ML  SOLN FLUOROSCOPY TIME:  Fluoroscopy Time: 7 minutes 48  seconds (241 mGy). COMPLICATIONS: None immediate. PROCEDURE: Informed consent was obtained from the patient following explanation of the procedure, risks, benefits and alternatives. The patient understands, agrees and consents for the procedure. All questions were addressed. A time out was performed prior to the initiation of the procedure. Maximal barrier sterile technique utilized including caps, mask, sterile gowns, sterile gloves, large sterile drape, hand hygiene, and Betadine prep. Under sterile conditions and local anesthesia, right common femoral artery ultrasound micropuncture access performed. Images obtained for documentation. Five French sheath inserted. Initially a C2 catheter was advanced into the aorta. Attempts were made to cannulate the SMA with a C2 catheter however this was unsuccessful. Catheter was exchanged for a Sos Omni select catheter. This catheter was reformed in the aorta and utilized to select the SMA. SMA angiograms performed in the frontal and oblique projections. SMA: Atherosclerotic changes of the SMA origin and trunk. SMA trunk is widely patent. Jejunal and colic branches are patent. 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 No evidence of active contrast extravasation or active bleeding in the right colon. On the venous phase, the superior mesenteric vein and portal vein are both patent. Catheter access removed. Hemostasis obtained with manual compression. No immediate complication. Patient tolerated the procedure well. IMPRESSION: Negative for active mesenteric bleeding in the right colon. Electronically Signed   By: Jerilynn Mages.  Shick M.D.   On: 06/01/2015 12:28   Dg Chest  Port 1 View  06/03/2015  CLINICAL DATA:  Gastrointestinal bleeding.  Anemia. EXAM: PORTABLE CHEST 1 VIEW COMPARISON:  None. FINDINGS: Small left pleural effusion and basilar atelectasis have slightly worsened. Bochdalek's hernia on the right is noted. Right  lung is clear. Heart size is upper normal. IMPRESSION: Some increase in a small left pleural effusion and basilar atelectasis. Electronically Signed   By: Inge Rise M.D.   On: 06/03/2015 08:16   Dg Chest Port 1 View  06/01/2015  CLINICAL DATA:  Gastrointestinal bleeding EXAM: PORTABLE CHEST 1 VIEW COMPARISON:  10/22/2013 FINDINGS: Heart is upper limits normal in size. Increasing bibasilar airspace opacities, likely atelectasis. Possible small left effusion. No acute bony abnormality. IMPRESSION: Increasing bibasilar opacities, likely atelectasis. Question small left effusion. Electronically Signed   By: Rolm Baptise M.D.   On: 06/01/2015 07:07    Microbiology: Recent Results (from the past 240 hour(s))  MRSA PCR Screening     Status: None   Collection Time: 05/31/15 11:43 PM  Result Value Ref Range Status   MRSA by PCR NEGATIVE NEGATIVE Final    Comment:        The GeneXpert MRSA Assay (FDA approved for NASAL specimens only), is one component of a comprehensive MRSA colonization surveillance program. It is not intended to diagnose MRSA infection nor to guide or monitor treatment for MRSA infections.   Culture, blood (x 2)     Status: None (Preliminary result)   Collection Time: 06/01/15  2:59 AM  Result Value Ref Range Status   Specimen Description BLOOD RIGHT HAND  Final   Special Requests BOTTLES DRAWN AEROBIC AND ANAEROBIC 5CC  Final   Culture NO GROWTH 3 DAYS  Final   Report Status PENDING  Incomplete  Culture, blood (x 2)     Status: None (Preliminary result)   Collection Time: 06/01/15  4:31 AM  Result Value Ref Range Status   Specimen Description BLOOD RIGHT ARM  Final   Special Requests BOTTLES DRAWN AEROBIC AND ANAEROBIC 5CC  Final   Culture NO GROWTH 3 DAYS  Final   Report Status PENDING  Incomplete  Urine culture     Status: None   Collection Time: 06/02/15  3:09 PM  Result Value Ref Range Status   Specimen Description URINE, RANDOM  Final   Special Requests  NONE  Final   Culture MULTIPLE SPECIES PRESENT, SUGGEST RECOLLECTION  Final   Report Status 06/04/2015 FINAL  Final     Labs: Basic Metabolic Panel:  Recent Labs Lab 05/31/15 0254 06/01/15 1250 06/02/15 0608 06/02/15 1011 06/03/15 0328 06/04/15 0445  NA 139 137  --  138 139 138  K 3.5 3.6  --  3.5 3.5 3.3*  CL 110 111  --  108 105 107  CO2 26 21*  --  23 22 26   GLUCOSE 124* 92  --  92 75 100*  BUN 20 13  --  8 5* <5*  CREATININE 0.76 0.84  --  0.83 0.82 0.77  CALCIUM 7.6* 7.6*  --  8.0* 8.1* 7.9*  MG  --   --  1.5*  --  1.7  --    Liver Function Tests:  Recent Labs Lab 05/29/15 1703  AST 14*  ALT <5*  ALKPHOS 93  BILITOT 0.7  PROT 6.9  ALBUMIN 3.6   No results for input(s): LIPASE, AMYLASE in the last 168 hours. No results for input(s): AMMONIA in the last 168 hours. CBC:  Recent Labs Lab 06/02/15 1011 06/02/15  1630 06/03/15 0035 06/03/15 1646 06/04/15 0445  WBC 7.8 6.8 7.3 9.9 7.6  HGB 7.9* 7.7* 8.1* 8.7* 7.9*  HCT 23.2* 23.0* 23.4* 26.2* 23.0*  MCV 85.0 84.9 85.7 85.3 85.5  PLT 120* 131* 140* 188 174   Cardiac Enzymes:  Recent Labs Lab 05/29/15 2012  TROPONINI <0.03   BNP: BNP (last 3 results) No results for input(s): BNP in the last 8760 hours.  ProBNP (last 3 results) No results for input(s): PROBNP in the last 8760 hours.  CBG:  Recent Labs Lab 06/01/15 0824 06/03/15 1715  GLUCAP 88 102*       Signed:  Charlynne Cousins MD.  [facility] 06/05/2015, 10:58 AM

## 2015-06-05 NOTE — Telephone Encounter (Signed)
Please advise 

## 2015-06-05 NOTE — Telephone Encounter (Addendum)
Pt daughter came in to drop off the FMLA forms to be filled out.. Placed in Dr. Nicki Reaper box. Please advise daughter with questions at [phone]. Please fax to [phone].

## 2015-06-05 NOTE — Progress Notes (Signed)
Physical Therapy Treatment Patient Details Name: Taylor Watson MRN: [id number] DOB: 10/17/1918 Today's Date: 06/05/2015    History of Present Illness 82 yo female admitted with GIB rectal bleeding with sepsis PMH: htn, cva, anemia,gerd,kidney stone depression    PT Comments    Pt performed increased gait distance during todays session.  Pt requires cues for safety and demonstrated improved ability to follow one step commands.  Pt tolerated tx well.  Sitter remains present as pt continues to attempt getting up without assistance.    Follow Up Recommendations  Home health PT;Supervision/Assistance - 24 hour     Equipment Recommendations  Rolling walker with 5" wheels;3in1 (PT)    Recommendations for Other Services       Precautions / Restrictions Precautions Precautions: Fall Restrictions Weight Bearing Restrictions: No    Mobility  Bed Mobility Overal bed mobility: Modified Independent             General bed mobility comments: required use of bed rail  Transfers Overall transfer level: Needs assistance Equipment used: Rolling walker (2 wheeled) Transfers: Sit to/from Stand Sit to Stand: Min guard Stand pivot transfers: Min guard       General transfer comment: Pt remains to requires cues for hand placement and foot placement to improve safety with technuique.    Ambulation/Gait Ambulation/Gait assistance: Min assist Ambulation Distance (Feet): 274 Feet Assistive device: Rolling walker (2 wheeled) Gait Pattern/deviations: Step-through pattern;Staggering left;Staggering right;Trunk flexed;Shuffle     General Gait Details: Pt remains to require RW for safety with gait training.  Pt requires assist and tactile cueing to negotiate obstacles in halls.  Pt demonstrates increased gait speed and ability to follow commands during todays session.  Pt remains unsteady but able to advance gait distance.     Stairs            Wheelchair Mobility    Modified  Rankin (Stroke Patients Only)       Balance Overall balance assessment: Needs assistance Sitting-balance support: Bilateral upper extremity supported;Feet supported Sitting balance-Leahy Scale: Good     Standing balance support: Bilateral upper extremity supported;During functional activity Standing balance-Leahy Scale: Fair                      Cognition Arousal/Alertness: Awake/alert Behavior During Therapy: WFL for tasks assessed/performed Overall Cognitive Status: Within Functional Limits for tasks assessed       Memory: Decreased short-term memory              Exercises      General Comments        Pertinent Vitals/Pain Pain Assessment: No/denies pain    Home Living Family/patient expects to be discharged to:: Private residence                    Prior Function            PT Goals (current goals can now be found in the care plan section) Acute Rehab PT Goals Potential to Achieve Goals: Good Progress towards PT goals: Progressing toward goals    Frequency  Min 3X/week    PT Plan      Co-evaluation             End of Session Equipment Utilized During Treatment: Gait belt Activity Tolerance: Patient tolerated treatment well Patient left: with nursing/sitter in room;in chair;with call bell/phone within reach     Time: 0927-0942 PT Time Calculation (min) (ACUTE ONLY): 15 min  Charges:  $  Gait Training: 8-22 mins                    G Codes:      Cristela Blue 13-Jun-2015, 11:53 AM Governor Rooks, PTA pager [phone]

## 2015-06-05 NOTE — Telephone Encounter (Signed)
Spoke with pt and asked her to drop off paperwork for [employer]

## 2015-06-05 NOTE — Progress Notes (Signed)
Occupational Therapy Treatment Patient Details Name: Taylor Watson MRN: [id number] DOB: Jul 29, 1918 Today's Date: 06/05/2015    History of present illness 82 yo female admitted with GIB rectal bleeding with sepsis PMH: htn, cva, anemia,gerd,kidney stone depression   OT comments  Pt more alert and awake this session.  Bathing at sit > stand level with setup and min/steady assist for standing balance.  Pt continues to require min assist with ambulation and functional transfers secondary to generalized weakness.  Pt will continue to benefit from acute OT to continue to improve independence and safety with ADLs and functional mobility.  Follow Up Recommendations  Home health OT    Equipment Recommendations  3 in 1 bedside comode;Other (comment) (RW)    Recommendations for Other Services      Precautions / Restrictions Precautions Precautions: Fall Restrictions Weight Bearing Restrictions: No       Mobility  Transfers Overall transfer level: Needs assistance Equipment used: Rolling walker (2 wheeled) Transfers: Sit to/from [employer] Sit to Stand: Min assist Stand pivot transfers: Min assist       General transfer comment: cues for hand placement and with turns and obstacle negotiation to access bathroom        ADL Overall ADL's : Needs assistance/impaired         Upper Body Bathing: Set up;Sitting   Lower Body Bathing: Moderate assistance;Sit to/from stand Lower Body Bathing Details (indicate cue type and reason): Pt required assistance for thoroughness with washing buttocks secondary to having loose BM.  Also requiring steady assist in standing when attempting to wash buttocks. Upper Body Dressing : Set up Upper Body Dressing Details (indicate cue type and reason): donned hospital gown     Toilet Transfer: Minimal assistance;Ambulation;Comfort height toilet;Grab bars;RW   Toileting- Clothing Manipulation and Hygiene: Minimal assistance       Functional mobility during ADLs: Rolling walker;Min guard;Minimal assistance General ADL Comments: Ambulated to bathroom with RW min guard - min assist when turning and navigating obstacles.  Min assist with sit > stand from toilet.                  Cognition   Behavior During Therapy: Flat affect Overall Cognitive Status: History of cognitive impairments - at baseline       Memory: Decreased short-term memory                            Pertinent Vitals/ Pain       Pain Assessment: No/denies pain         Frequency Min 2X/week     Progress Toward Goals  OT Goals(current goals can now be found in the care plan section)  Progress towards OT goals: Progressing toward goals     Plan Discharge plan remains appropriate;Frequency remains appropriate       End of Session Equipment Utilized During Treatment: Gait belt;Rolling walker   Activity Tolerance Patient tolerated treatment well   Patient Left in chair;with call bell/phone within reach;with chair alarm set;with nursing/sitter in room   Nurse Communication Mobility status (toileting)        Time: [id number] OT Time Calculation (min): 16 min  Charges: OT General Charges $OT Visit: 1 Procedure OT Treatments $Self Care/Home Management : 8-22 mins  Simonne Come, [id number] 06/05/2015, 10:50 AM

## 2015-06-05 NOTE — Telephone Encounter (Signed)
Form completed and placed in your box.  

## 2015-06-06 ENCOUNTER — Telehealth: Payer: Self-pay | Admitting: Internal Medicine

## 2015-06-06 ENCOUNTER — Telehealth: Payer: Self-pay

## 2015-06-06 LAB — CULTURE, BLOOD (ROUTINE X 2)
Culture: NO GROWTH
Culture: NO GROWTH

## 2015-06-06 NOTE — Telephone Encounter (Signed)
FMLA faxed.

## 2015-06-06 NOTE — Telephone Encounter (Signed)
faxed

## 2015-06-06 NOTE — Telephone Encounter (Signed)
Daughter aware.

## 2015-06-06 NOTE — Telephone Encounter (Signed)
Transition Care Management Follow-up Telephone Call   Date discharged? 06/05/15   How have you been since you were released from the hospital? Eating and drinking okay.  Resting well.  No pain.  No bleeding today.   Do you understand why you were in the hospital? Yes, blood in the stool.   Do you understand the discharge instructions? Yes, moving slowly between activities.   Where were you discharged to? Home   Items Reviewed:  Medications reviewed: Yes, continuing medications as prescribed and without issues.  Allergies reviewed: Yes, no changes.  Dietary changes reviewed: Yes, low sodium, heart healthy diet.  Referrals reviewed: Yes, appointment made with PCP 1 week CBC check.  Home health/home care in the process.   Functional Questionnaire:   Activities of Daily Living (ADLs):   She states they are independent in the following: Bathing, dressing, toileting, self feeding, grooming. States they require assistance with the following: Ambulating (uses walker).   Any transportation issues/concerns?: No.   Any patient concerns? Needs a bedside commode and a walker with wheels. Currently borrowing daughter's walker.   Confirmed importance and date/time of follow-up visits scheduled Yes, appointment made 06/12/15 at 12:00.  Provider Appointment booked with Dr. Nicki Reaper (PCP).  Confirmed with patient if condition begins to worsen call PCP or go to the ER.  Patient was given the office number and encouraged to call back with question or concerns.  : Yes, verbalized understanding.

## 2015-06-06 NOTE — Telephone Encounter (Signed)
[employer] stated that this pt was discharged from the hospital 06/05/15 for Lower GI bleed, info available on EPIC

## 2015-06-06 NOTE — Telephone Encounter (Signed)
Gregary Signs [id number] called from [place] needing a copy of her office note most recent and demographics. Gregary Signs will be faxing over the order today. Pt was diagnosed with Colon cancer. Pt daughter wants someone to come out tomorrow.Fax number [phone]. Thank you!

## 2015-06-12 ENCOUNTER — Encounter: Payer: Self-pay | Admitting: Internal Medicine

## 2015-06-12 ENCOUNTER — Other Ambulatory Visit: Payer: Self-pay | Admitting: Internal Medicine

## 2015-06-12 ENCOUNTER — Ambulatory Visit (INDEPENDENT_AMBULATORY_CARE_PROVIDER_SITE_OTHER): Admitting: Internal Medicine

## 2015-06-12 VITALS — BP 104/50 | HR 60 | Temp 97.5°F | Ht 63.5 in | Wt 122.5 lb

## 2015-06-12 DIAGNOSIS — R221 Localized swelling, mass and lump, neck: Secondary | ICD-10-CM

## 2015-06-12 DIAGNOSIS — D649 Anemia, unspecified: Secondary | ICD-10-CM | POA: Diagnosis not present

## 2015-06-12 DIAGNOSIS — I10 Essential (primary) hypertension: Secondary | ICD-10-CM

## 2015-06-12 DIAGNOSIS — K625 Hemorrhage of anus and rectum: Secondary | ICD-10-CM

## 2015-06-12 DIAGNOSIS — K219 Gastro-esophageal reflux disease without esophagitis: Secondary | ICD-10-CM

## 2015-06-12 DIAGNOSIS — Z23 Encounter for immunization: Secondary | ICD-10-CM

## 2015-06-12 DIAGNOSIS — F419 Anxiety disorder, unspecified: Secondary | ICD-10-CM | POA: Diagnosis not present

## 2015-06-12 DIAGNOSIS — E876 Hypokalemia: Secondary | ICD-10-CM

## 2015-06-12 LAB — CBC WITH DIFFERENTIAL/PLATELET
Basophils Absolute: 0 10*3/uL (ref 0.0–0.1)
Basophils Relative: 0.1 % (ref 0.0–3.0)
EOS PCT: 4 % (ref 0.0–5.0)
Eosinophils Absolute: 0.3 10*3/uL (ref 0.0–0.7)
HCT: 30.2 % — ABNORMAL LOW (ref 36.0–46.0)
HEMOGLOBIN: 9.6 g/dL — AB (ref 12.0–15.0)
Lymphocytes Relative: 16.4 % (ref 12.0–46.0)
Lymphs Abs: 1.1 10*3/uL (ref 0.7–4.0)
MCHC: 31.9 g/dL (ref 30.0–36.0)
MCV: 87 fl (ref 78.0–100.0)
MONO ABS: 0.5 10*3/uL (ref 0.1–1.0)
MONOS PCT: 7.1 % (ref 3.0–12.0)
Neutro Abs: 4.8 10*3/uL (ref 1.4–7.7)
Neutrophils Relative %: 72.4 % (ref 43.0–77.0)
Platelets: 512 10*3/uL — ABNORMAL HIGH (ref 150.0–400.0)
RBC: 3.47 Mil/uL — AB (ref 3.87–5.11)
RDW: 15.8 % — ABNORMAL HIGH (ref 11.5–15.5)
WBC: 6.6 10*3/uL (ref 4.0–10.5)

## 2015-06-12 LAB — BASIC METABOLIC PANEL
BUN: 10 mg/dL (ref 6–23)
CHLORIDE: 101 meq/L (ref 96–112)
CO2: 34 meq/L — AB (ref 19–32)
CREATININE: 0.75 mg/dL (ref 0.40–1.20)
Calcium: 9.2 mg/dL (ref 8.4–10.5)
GFR: 92.45 mL/min (ref >60.00)
Glucose, Bld: 91 mg/dL (ref 70–99)
Potassium: 3.9 mEq/L (ref 3.5–5.1)
Sodium: 140 mEq/L (ref 135–145)

## 2015-06-12 LAB — FERRITIN: FERRITIN: 12.5 ng/mL (ref 10.0–291.0)

## 2015-06-12 NOTE — Progress Notes (Signed)
Orders placed for f/u labs.  

## 2015-06-12 NOTE — Progress Notes (Signed)
Patient ID: AVERLEE REDFERN, female   DOB: 1918/11/25, 82 y.o.   MRN: [id number]   Subjective:    Patient ID: Taylor Watson, female    DOB: [birth year], 82 y.o.   MRN: [id number]  HPI  Patient here hospital follow up.  Was recently admitted for GI bleed.  Refer to discharge summary for details.   hgb dropped.  Transfused.  Bleeding scan positive in the cecum/proximal ascending colon.  CT scan of the abdomen revealed a mass.  GI doctor - felt to be a poor candidate for colonoscopy.  Started on PPI.  Palliative care was consulted.  She was discharged with home health.  Has had no further bleeding.  She is eating.   No nausea or vomiting.  She does feel some fatigue.  No light headedness or dizziness.  She is accompanied by her daughter.  History obtained from both of them.     Past Medical History  Diagnosis Date  . Hypertension   . CVA (cerebral vascular accident) ([place])   . Anemia   . Hypercholesterolemia   . GERD (gastroesophageal reflux disease)   . Allergy   . Hx: UTI (urinary tract infection)   . History of blood transfusion   . History of kidney stones   . Depression    Past Surgical History  Procedure Laterality Date  . Cholecystectomy  1984  . Neck lesion biopsy  2015    Followed by Dr. Richardson Landry   Family History  Problem Relation Age of Onset  . Cancer Mother     unknown type  . Hypertension Mother   . Cancer Father     unknown type  . Breast cancer Daughter   . Colon cancer      grandfather   Social History   Social History  . Marital Status: Widowed    Spouse Name: N/A  . Number of Children: 5  . Years of Education: N/A   Social History Main Topics  . Smoking status: Former Research scientist (life sciences)  . Smokeless tobacco: Former Systems developer    Types: Chew  . Alcohol Use: No  . Drug Use: No  . Sexual Activity: Not Currently   Other Topics Concern  . None   Social History Narrative     Review of Systems  Constitutional: Positive for fatigue.       Reports she is  eating.  Daughter staying with her.    HENT: Negative for congestion and sinus pressure.   Respiratory: Negative for cough, chest tightness and shortness of breath.   Cardiovascular: Negative for chest pain, palpitations and leg swelling.  Gastrointestinal: Negative for nausea, vomiting, abdominal pain and diarrhea.  Genitourinary: Negative for dysuria and difficulty urinating.  Musculoskeletal: Negative for back pain and joint swelling.  Skin: Negative for color change and rash.  Neurological: Negative for dizziness, light-headedness and headaches.  Psychiatric/Behavioral: Negative for dysphoric mood and agitation.       Discussed lexapro.  Will continue.  She has felt it has helped to level things out.         Objective:    Physical Exam  Constitutional: She appears well-developed and well-nourished. No distress.  HENT:  Nose: Nose normal.  Mouth/Throat: Oropharynx is clear and moist.  Neck: Neck supple. No thyromegaly present.  Cardiovascular: Normal rate and regular rhythm.   Pulmonary/Chest: Breath sounds normal. No respiratory distress. She has no wheezes.  Abdominal: Soft. Bowel sounds are normal. There is no tenderness.  Musculoskeletal: She exhibits no edema or tenderness.  Lymphadenopathy:    She has no cervical adenopathy.  Skin: No rash noted. No erythema.  Psychiatric: She has a normal mood and affect. Her behavior is normal.    BP 104/50 mmHg  Pulse 60  Temp(Src) 97.5 F (36.4 C) (Oral)  Ht 5' 3.5" (1.613 m)  Wt 122 lb 8 oz (55.566 kg)  BMI 21.36 kg/m2  SpO2 95% Wt Readings from Last 3 Encounters:  06/12/15 122 lb 8 oz (55.566 kg)  06/03/15 139 lb 8.8 oz (63.3 kg)  05/29/15 121 lb 6.4 oz (55.067 kg)     Lab Results  Component Value Date   WBC 6.6 06/12/2015   HGB 9.6* 06/12/2015   HCT 30.2* 06/12/2015   PLT 512.0* 06/12/2015   GLUCOSE 91 06/12/2015   CHOL 254* 05/28/2015   TRIG 100.0 05/28/2015   HDL 58.40 05/28/2015   LDLDIRECT 163.5 01/12/2013     LDLCALC 176* 05/28/2015   ALT <5* 05/29/2015   AST 14* 05/29/2015   NA 140 06/12/2015   K 3.9 06/12/2015   CL 101 06/12/2015   CREATININE 0.75 06/12/2015   BUN 10 06/12/2015   CO2 34* 06/12/2015   TSH 2.15 05/28/2015   INR 1.38 06/01/2015   HGBA1C 5.7 10/04/2013    Nm Gi Blood Loss  05/31/2015  CLINICAL DATA:  Patient with multiple episodes of blood within the stool. Week and fatigue. Low hemoglobin. EXAM: NUCLEAR MEDICINE GASTROINTESTINAL BLEEDING SCAN TECHNIQUE: Sequential abdominal images were obtained following intravenous administration of Tc-68m labeled red blood cells. RADIOPHARMACEUTICALS:  21.7 mCi Tc-51m in-vitro labeled red cells. COMPARISON:  None. FINDINGS: Radiotracer is demonstrated within the expected location of the cecum and ascending colon progressing distally into the transverse and proximal descending colon. Tracer is demonstrated within the vasculature as well as within the bladder. IMPRESSION: Positive bleeding examination, with origin likely within the region of the cecum/proximal ascending colon. Critical Value/emergent results were called by telephone at the time of interpretation on 05/31/2015 at 7:08 pm to Dr. Abel Presto , who verbally acknowledged these results. Electronically Signed   By: Lovey Newcomer M.D.   On: 05/31/2015 19:09   Ct Abdomen Pelvis W Contrast  06/01/2015  CLINICAL DATA:  Colonic GI bleed EXAM: CT ABDOMEN AND PELVIS WITH CONTRAST TECHNIQUE: Multidetector CT imaging of the abdomen and pelvis was performed using the standard protocol following bolus administration of intravenous contrast. CONTRAST:  37mL OMNIPAQUE IOHEXOL 300 MG/ML  SOLN COMPARISON:  05/31/2015 FINDINGS: Lung bases demonstrate bibasilar atelectasis without sizable effusions. Gallbladder is been surgically removed. The liver demonstrates a 2.2 cmhepatic cyst in the left lobe stable from the prior exam. The spleen, adrenal glands and pancreas are within normal limits with the exception  of some mild calcifications within the pancreas which are stable in appearance from the prior exam. The kidneys demonstrate a normal enhancement pattern. Prominent extrarenal pelves are noted bilaterally. These findings are stable from the prior exam. No obstructive changes are seen. Bladder is well distended. The colon demonstrates diffuse diverticular change without evidence of diverticulitis. The appendix is filled with barium. The ascending colon demonstrates diverticular change which may be the etiology of the patient's underlying hemorrhage. Additionally a filling defect is noted within the barium in the cecum in the possibility of an underlying polypoid lesion cannot be totally excluded. Diffuse aortoiliac calcifications are noted without aneurysmal dilatation. Calcified uterine fibroids are seen. The osseous structures show no acute abnormality. IMPRESSION: Filling defect within the cecum. This may be in part related incomplete distension  although the possibility of a polypoid lesion would deserve consideration given the recent hemorrhage. Diverticulosis without diverticulitis. Diverticular change may be the etiology of the patient's right colon hemorrhage. Chronic changes as described above. Electronically Signed   By: Inez Catalina M.D.   On: 06/01/2015 07:30   Ir Angiogram Visceral Selective  06/01/2015  INDICATION: Acute lower GI bleeding originating in the cecum. EXAM: SELECTIVE VISCERAL ARTERIOGRAPHY; IR ULTRASOUND GUIDANCE VASC ACCESS RIGHT MEDICATIONS: 1% lidocaine locally. ANESTHESIA/SEDATION: Fentanyl 25 mcg IV; Versed 1.5 mg IV Moderate Sedation Time:  30 minutes The patient was continuously monitored during the procedure by the interventional radiology nurse under my direct supervision. CONTRAST:  58mL OMNIPAQUE IOHEXOL 300 MG/ML  SOLN FLUOROSCOPY TIME:  Fluoroscopy Time: 7 minutes 48 seconds (241 mGy). COMPLICATIONS: None immediate. PROCEDURE: Informed consent was obtained from the patient  following explanation of the procedure, risks, benefits and alternatives. The patient understands, agrees and consents for the procedure. All questions were addressed. A time out was performed prior to the initiation of the procedure. Maximal barrier sterile technique utilized including caps, mask, sterile gowns, sterile gloves, large sterile drape, hand hygiene, and Betadine prep. Under sterile conditions and local anesthesia, right common femoral artery ultrasound micropuncture access performed. Images obtained for documentation. Five French sheath inserted. Initially a C2 catheter was advanced into the aorta. Attempts were made to cannulate the SMA with a C2 catheter however this was unsuccessful. Catheter was exchanged for a Sos Omni select catheter. This catheter was reformed in the aorta and utilized to select the SMA. SMA angiograms performed in the frontal and oblique projections. SMA: Atherosclerotic changes of the SMA origin and trunk. SMA trunk is widely patent. Jejunal and colic branches are patent. 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 No evidence of active contrast extravasation or active bleeding in the right colon. On the venous phase, the superior mesenteric vein and portal vein are both patent. Catheter access removed. Hemostasis obtained with manual compression. No immediate complication. Patient tolerated the procedure well. IMPRESSION: Negative for active mesenteric bleeding in the right colon. Electronically Signed   By: Jerilynn Mages.  Shick M.D.   On: 06/01/2015 12:28   Ir US Guide Vasc Access Right  06/01/2015  INDICATION: Acute lower GI bleeding originating in the cecum. EXAM: SELECTIVE VISCERAL ARTERIOGRAPHY; IR ULTRASOUND GUIDANCE VASC ACCESS RIGHT MEDICATIONS: 1% lidocaine locally. ANESTHESIA/SEDATION: Fentanyl 25 mcg IV; Versed 1.5 mg IV Moderate Sedation Time:  30 minutes The patient was continuously monitored during the procedure  by the interventional radiology nurse under my direct supervision. CONTRAST:  28mL OMNIPAQUE IOHEXOL 300 MG/ML  SOLN FLUOROSCOPY TIME:  Fluoroscopy Time: 7 minutes 48 seconds (241 mGy). COMPLICATIONS: None immediate. PROCEDURE: Informed consent was obtained from the patient following explanation of the procedure, risks, benefits and alternatives. The patient understands, agrees and consents for the procedure. All questions were addressed. A time out was performed prior to the initiation of the procedure. Maximal barrier sterile technique utilized including caps, mask, sterile gowns, sterile gloves, large sterile drape, hand hygiene, and Betadine prep. Under sterile conditions and local anesthesia, right common femoral artery ultrasound micropuncture access performed. Images obtained for documentation. Five French sheath inserted. Initially a C2 catheter was advanced into the aorta. Attempts were made to cannulate the SMA with a C2 catheter however this was unsuccessful. Catheter was exchanged for a Sos Omni select catheter. This catheter was reformed in the aorta and utilized to select the SMA. SMA angiograms performed in the  frontal and oblique projections. SMA: Atherosclerotic changes of the SMA origin and trunk. SMA trunk is widely patent. Jejunal and colic branches are patent. 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 No evidence of active contrast extravasation or active bleeding in the right colon. On the venous phase, the superior mesenteric vein and portal vein are both patent. Catheter access removed. Hemostasis obtained with manual compression. No immediate complication. Patient tolerated the procedure well. IMPRESSION: Negative for active mesenteric bleeding in the right colon. Electronically Signed   By: Jerilynn Mages.  Shick M.D.   On: 06/01/2015 12:28   Dg Chest Port 1 View  06/01/2015  CLINICAL DATA:  Gastrointestinal bleeding EXAM: PORTABLE CHEST 1  VIEW COMPARISON:  10/22/2013 FINDINGS: Heart is upper limits normal in size. Increasing bibasilar airspace opacities, likely atelectasis. Possible small left effusion. No acute bony abnormality. IMPRESSION: Increasing bibasilar opacities, likely atelectasis. Question small left effusion. Electronically Signed   By: Rolm Baptise M.D.   On: 06/01/2015 07:07       Assessment & Plan:   Problem List Items Addressed This Visit    Anxiety    Has been doing better on lexapro.  Continue.        Essential hypertension, benign    Blood pressure on recheck improved - 112/68.  Follow.  On amlodipine.        Relevant Medications   amLODipine (NORVASC) 2.5 MG tablet   aspirin 81 MG tablet   GERD (gastroesophageal reflux disease)    On omeprazole.  No upper symptoms.  Continue.        Neck nodule    Saw ENT.  Brachial plexus lesion.  They discussed neurosurgery referral.  She declined.  Stable.        Rectal bleeding    Recently admitted for GI bleed as outlined.  W/up as outlined.  Felt not to be a good candidate for colonoscopy.  Discharged with home health.  No further bleeding.  Recheck cbc today.  S/p transfusion.         Other Visit Diagnoses    Need for prophylactic vaccination against Streptococcus pneumoniae (pneumococcus)    -  Primary    Anemia, unspecified anemia type        Hypokalemia            [name], MD

## 2015-06-12 NOTE — Progress Notes (Signed)
Pre visit review using our clinic review tool, if applicable. No additional management support is needed unless otherwise documented below in the visit note. 

## 2015-06-15 ENCOUNTER — Encounter: Payer: Self-pay | Admitting: Internal Medicine

## 2015-06-15 NOTE — Assessment & Plan Note (Signed)
Has been doing better on lexapro.  Continue.

## 2015-06-15 NOTE — Assessment & Plan Note (Signed)
Saw ENT.  Brachial plexus lesion.  They discussed neurosurgery referral.  She declined.  Stable.

## 2015-06-15 NOTE — Assessment & Plan Note (Signed)
Blood pressure on recheck improved - 112/68.  Follow.  On amlodipine.

## 2015-06-15 NOTE — Assessment & Plan Note (Signed)
Recently admitted for GI bleed as outlined.  W/up as outlined.  Felt not to be a good candidate for colonoscopy.  Discharged with home health.  No further bleeding.  Recheck cbc today.  S/p transfusion.

## 2015-06-15 NOTE — Assessment & Plan Note (Signed)
On omeprazole.  No upper symptoms.  Continue.

## 2015-06-16 ENCOUNTER — Telehealth: Payer: Self-pay | Admitting: Internal Medicine

## 2015-06-16 NOTE — Telephone Encounter (Signed)
Linus Orn [id number] called from [facility] regarding to check the status if a fax for pain and symptoms management protocol it was faxed on 06/07/2015. Fax number [phone]. Thank you!

## 2015-06-16 NOTE — Telephone Encounter (Signed)
Do you have paperwork on this? Thanks

## 2015-06-17 NOTE — Telephone Encounter (Signed)
It would have been placed in Dr. Bary Leriche folder to complete

## 2015-06-18 NOTE — Telephone Encounter (Signed)
Requested another copy of form from Hospice

## 2015-06-18 NOTE — Telephone Encounter (Signed)
As we discussed, you found the one form that was signed.  I thought the other form was signed as well.  I do not have the form.  Will need another form sent if unable to locate (unless the form you have is the form they are wanting).  Thanks

## 2015-06-19 ENCOUNTER — Telehealth: Payer: Self-pay | Admitting: Internal Medicine

## 2015-06-19 NOTE — Telephone Encounter (Signed)
Waiting on signature from PCP.

## 2015-06-19 NOTE — Telephone Encounter (Signed)
Tina with [facility] left a voice mail requesting an order for Senna S 2 tablets at Bedtime and milk of magnesia . She is also following up on the patient's pain symptoms protocol that has not been faxed back .

## 2015-06-19 NOTE — Telephone Encounter (Signed)
Please advise, do you have the patient's pain symptom protocol? thanks

## 2015-06-20 NOTE — Telephone Encounter (Signed)
Faxed form back to Hospice

## 2015-06-20 NOTE — Telephone Encounter (Signed)
Faxed today

## 2015-09-03 ENCOUNTER — Other Ambulatory Visit: Payer: Self-pay | Admitting: Internal Medicine

## 2015-09-25 ENCOUNTER — Ambulatory Visit: Payer: Medicare Other | Admitting: Internal Medicine

## 2015-09-25 ENCOUNTER — Telehealth: Payer: Self-pay | Admitting: Internal Medicine

## 2015-09-25 DIAGNOSIS — Z0289 Encounter for other administrative examinations: Secondary | ICD-10-CM

## 2015-09-25 NOTE — Telephone Encounter (Signed)
Please have them refax

## 2015-09-25 NOTE — Telephone Encounter (Signed)
Spoke with Pamala Hurry and she will refax the orders.

## 2015-09-25 NOTE — Telephone Encounter (Signed)
Taylor Watson called from [facility] regarding form for Dr Nicki Reaper to sign and send back it was faxed on 09/05/15 and they have not received it. Form was for certification for plan of treatment. Please call if need to refax ask for [facility] [phone]. Thank you!

## 2015-10-15 ENCOUNTER — Other Ambulatory Visit: Payer: Self-pay | Admitting: Internal Medicine

## 2015-10-15 DIAGNOSIS — G540 Brachial plexus disorders: Secondary | ICD-10-CM | POA: Diagnosis not present

## 2015-10-15 NOTE — Telephone Encounter (Signed)
Okay to refill? Pt seen in February & no showed in May. No future appt scheduled. Last refilled in November with 2 refills.

## 2015-10-16 ENCOUNTER — Telehealth: Payer: Self-pay | Admitting: *Deleted

## 2015-10-16 NOTE — Telephone Encounter (Signed)
Ok to refill.  Pt being followed by hospice.

## 2015-10-16 NOTE — Telephone Encounter (Signed)
Spoke the daughter and they would like an appt to review all her medications that you are prescribing for her. Please advise.  She is having Hospice care and I explained that they can assist and she wants the appt with you to discuss.

## 2015-10-16 NOTE — Telephone Encounter (Signed)
Patient requested to be seen by Dr. Nicki Reaper to have a medication consult. Please advise a place to schedule patient.

## 2015-10-16 NOTE — Telephone Encounter (Signed)
My understanding hospice is seeing her.  I can schedule her an appt, but need to know specifically what she is needing.  Any problems?

## 2015-10-16 NOTE — Telephone Encounter (Signed)
Please advise 

## 2015-10-16 NOTE — Telephone Encounter (Signed)
I can see her on 11/05/15 at 8:00 - 30 minute appt.  I am going to be gone all next week.  Let me know if this is a problem.

## 2015-10-17 NOTE — Telephone Encounter (Signed)
Attempted to call patient, phone is stating she is not available, not able to leave a message, tried cell phone it is not a working number.

## 2015-10-20 NOTE — Telephone Encounter (Signed)
Appt time is taken, unable to reach patient still, please advise. thanks

## 2015-10-20 NOTE — Telephone Encounter (Signed)
I can see her on 10/28/15 at 2:30.  Block appt for her and if you are unable to get in touch with family, if she is seeing hospice - then contact hospice and see if they can get message to her.  Will need to block appt for her.

## 2015-10-21 NOTE — Telephone Encounter (Signed)
Spoke with daughter, scheduled. thanks

## 2015-10-28 ENCOUNTER — Encounter: Payer: Self-pay | Admitting: Internal Medicine

## 2015-10-28 ENCOUNTER — Ambulatory Visit (INDEPENDENT_AMBULATORY_CARE_PROVIDER_SITE_OTHER): Admitting: Internal Medicine

## 2015-10-28 VITALS — BP 120/60 | HR 60 | Temp 98.2°F | Resp 17 | Ht 63.5 in | Wt 122.4 lb

## 2015-10-28 DIAGNOSIS — R634 Abnormal weight loss: Secondary | ICD-10-CM | POA: Diagnosis not present

## 2015-10-28 DIAGNOSIS — D649 Anemia, unspecified: Secondary | ICD-10-CM | POA: Diagnosis not present

## 2015-10-28 DIAGNOSIS — R221 Localized swelling, mass and lump, neck: Secondary | ICD-10-CM

## 2015-10-28 DIAGNOSIS — I10 Essential (primary) hypertension: Secondary | ICD-10-CM | POA: Diagnosis not present

## 2015-10-28 DIAGNOSIS — F419 Anxiety disorder, unspecified: Secondary | ICD-10-CM

## 2015-10-28 DIAGNOSIS — K625 Hemorrhage of anus and rectum: Secondary | ICD-10-CM

## 2015-10-28 DIAGNOSIS — K219 Gastro-esophageal reflux disease without esophagitis: Secondary | ICD-10-CM

## 2015-10-28 LAB — BASIC METABOLIC PANEL
BUN: 9 mg/dL (ref 6–23)
CO2: 32 mEq/L (ref 19–32)
CREATININE: 0.79 mg/dL (ref 0.40–1.20)
Calcium: 9.6 mg/dL (ref 8.4–10.5)
Chloride: 102 mEq/L (ref 96–112)
GFR: 87 mL/min (ref >60.00)
GLUCOSE: 96 mg/dL (ref 70–99)
POTASSIUM: 4.1 meq/L (ref 3.5–5.1)
Sodium: 138 mEq/L (ref 135–145)

## 2015-10-28 LAB — CBC WITH DIFFERENTIAL/PLATELET
Eosinophils Relative: 4 % (ref 0.0–5.0)
HEMATOCRIT: 31 % — AB (ref 36.0–46.0)
Hemoglobin: 9.3 g/dL — ABNORMAL LOW (ref 12.0–15.0)
LYMPHS PCT: 22 % (ref 12.0–46.0)
Monocytes Relative: 11 % (ref 3.0–12.0)
NEUTROS PCT: 63 % (ref 43.0–77.0)
PLATELETS: 368 10*3/uL (ref 150.0–400.0)
RBC: 4.68 Mil/uL (ref 3.87–5.11)
RDW: 20.9 % — ABNORMAL HIGH (ref 11.5–15.5)
WBC: 5.8 10*3/uL (ref 4.0–10.5)

## 2015-10-28 LAB — FERRITIN: Ferritin: 9.1 ng/mL — ABNORMAL LOW (ref 10.0–291.0)

## 2015-10-28 NOTE — Progress Notes (Signed)
Patient ID: Taylor Watson, female   DOB: 01/13/1919, 82 y.o.   MRN: [id number]   Subjective:    Patient ID: Taylor Watson, female    DOB: May 01, 1919, 82 y.o.   MRN: [id number]  HPI  Patient here for a scheduled follow up.  She is accompanied by her granddaughter.  History obtained from both of them.  She is eating.  Weight stable from the last check.  Breathing stable.  No nausea or vomiting.  No further rectal bleeding.  Bowels stable.  Hospice coming in and helping her.  Getting around ok.  Discussed using a walker.     Past Medical History  Diagnosis Date  . Hypertension   . CVA (cerebral vascular accident) ([place])   . Anemia   . Hypercholesterolemia   . GERD (gastroesophageal reflux disease)   . Allergy   . Hx: UTI (urinary tract infection)   . History of blood transfusion   . History of kidney stones   . Depression    Past Surgical History  Procedure Laterality Date  . Cholecystectomy  1984  . Neck lesion biopsy  2015    Followed by Dr. Richardson Landry   Family History  Problem Relation Age of Onset  . Cancer Mother     unknown type  . Hypertension Mother   . Cancer Father     unknown type  . Breast cancer Daughter   . Colon cancer      grandfather   Social History   Social History  . Marital Status: Widowed    Spouse Name: N/A  . Number of Children: 5  . Years of Education: N/A   Social History Main Topics  . Smoking status: Former Research scientist (life sciences)  . Smokeless tobacco: Former Systems developer    Types: Chew  . Alcohol Use: No  . Drug Use: No  . Sexual Activity: Not Currently   Other Topics Concern  . None   Social History Narrative    Outpatient Encounter Prescriptions as of 10/28/2015  Medication Sig  . amLODipine (NORVASC) 2.5 MG tablet Take 2.5 mg by mouth daily.  Marland Kitchen aspirin 81 MG tablet Take 81 mg by mouth daily.  Marland Kitchen escitalopram (LEXAPRO) 10 MG tablet TAKE ONE TABLET BY MOUTH EVERY DAY  . omeprazole (PRILOSEC) 20 MG capsule TAKE ONE CAPSULE BY MOUTH DAILY  .  timolol (BETIMOL) 0.25 % ophthalmic solution 1-2 drops 2 (two) times daily.  . [DISCONTINUED] timolol (TIMOPTIC) 0.25 % ophthalmic solution    No facility-administered encounter medications on file as of 10/28/2015.    Review of Systems  Constitutional: Negative for appetite change and unexpected weight change.  HENT: Negative for congestion and sinus pressure.   Respiratory: Negative for cough, chest tightness and shortness of breath.   Cardiovascular: Negative for chest pain, palpitations and leg swelling.  Gastrointestinal: Negative for nausea, vomiting, abdominal pain and diarrhea.  Genitourinary: Negative for dysuria and difficulty urinating.  Musculoskeletal: Negative for back pain and joint swelling.  Skin: Negative for color change and rash.  Neurological: Negative for dizziness, light-headedness and headaches.  Psychiatric/Behavioral: Negative for dysphoric mood and agitation.       Objective:    Physical Exam  Constitutional: She appears well-developed and well-nourished. No distress.  HENT:  Nose: Nose normal.  Mouth/Throat: Oropharynx is clear and moist.  Neck: Neck supple. No thyromegaly present.  Cardiovascular: Normal rate and regular rhythm.   Pulmonary/Chest: Breath sounds normal. No respiratory distress. She has no wheezes.  Abdominal: Soft. Bowel sounds are  normal. There is no tenderness.  Musculoskeletal: She exhibits no edema or tenderness.  Lymphadenopathy:    She has no cervical adenopathy.  Skin: No rash noted. No erythema.  Psychiatric: She has a normal mood and affect. Her behavior is normal.    BP 120/60 mmHg  Pulse 60  Temp(Src) 98.2 F (36.8 C) (Oral)  Resp 17  Ht 5' 3.5" (1.613 m)  Wt 122 lb 6 oz (55.509 kg)  BMI 21.34 kg/m2  SpO2 90% Wt Readings from Last 3 Encounters:  10/28/15 122 lb 6 oz (55.509 kg)  06/12/15 122 lb 8 oz (55.566 kg)  06/03/15 139 lb 8.8 oz (63.3 kg)     Lab Results  Component Value Date   WBC 5.8 10/28/2015    HGB 9.3* 10/28/2015   HCT 31.0* 10/28/2015   PLT 368.0 10/28/2015   GLUCOSE 96 10/28/2015   CHOL 254* 05/28/2015   TRIG 100.0 05/28/2015   HDL 58.40 05/28/2015   LDLDIRECT 163.5 01/12/2013   LDLCALC 176* 05/28/2015   ALT <5* 05/29/2015   AST 14* 05/29/2015   NA 138 10/28/2015   K 4.1 10/28/2015   CL 102 10/28/2015   CREATININE 0.79 10/28/2015   BUN 9 10/28/2015   CO2 32 10/28/2015   TSH 2.15 05/28/2015   INR 1.38 06/01/2015   HGBA1C 5.7 10/04/2013    Nm Gi Blood Loss  05/31/2015  CLINICAL DATA:  Patient with multiple episodes of blood within the stool. Week and fatigue. Low hemoglobin. EXAM: NUCLEAR MEDICINE GASTROINTESTINAL BLEEDING SCAN TECHNIQUE: Sequential abdominal images were obtained following intravenous administration of Tc-93m labeled red blood cells. RADIOPHARMACEUTICALS:  21.7 mCi Tc-42m in-vitro labeled red cells. COMPARISON:  None. FINDINGS: Radiotracer is demonstrated within the expected location of the cecum and ascending colon progressing distally into the transverse and proximal descending colon. Tracer is demonstrated within the vasculature as well as within the bladder. IMPRESSION: Positive bleeding examination, with origin likely within the region of the cecum/proximal ascending colon. Critical Value/emergent results were called by telephone at the time of interpretation on 05/31/2015 at 7:08 pm to Dr. Abel Presto , who verbally acknowledged these results. Electronically Signed   By: Lovey Newcomer M.D.   On: 05/31/2015 19:09   Ct Abdomen Pelvis W Contrast  06/01/2015  CLINICAL DATA:  Colonic GI bleed EXAM: CT ABDOMEN AND PELVIS WITH CONTRAST TECHNIQUE: Multidetector CT imaging of the abdomen and pelvis was performed using the standard protocol following bolus administration of intravenous contrast. CONTRAST:  54mL OMNIPAQUE IOHEXOL 300 MG/ML  SOLN COMPARISON:  05/31/2015 FINDINGS: Lung bases demonstrate bibasilar atelectasis without sizable effusions. Gallbladder is been  surgically removed. The liver demonstrates a 2.2 cmhepatic cyst in the left lobe stable from the prior exam. The spleen, adrenal glands and pancreas are within normal limits with the exception of some mild calcifications within the pancreas which are stable in appearance from the prior exam. The kidneys demonstrate a normal enhancement pattern. Prominent extrarenal pelves are noted bilaterally. These findings are stable from the prior exam. No obstructive changes are seen. Bladder is well distended. The colon demonstrates diffuse diverticular change without evidence of diverticulitis. The appendix is filled with barium. The ascending colon demonstrates diverticular change which may be the etiology of the patient's underlying hemorrhage. Additionally a filling defect is noted within the barium in the cecum in the possibility of an underlying polypoid lesion cannot be totally excluded. Diffuse aortoiliac calcifications are noted without aneurysmal dilatation. Calcified uterine fibroids are seen. The osseous structures show no  acute abnormality. IMPRESSION: Filling defect within the cecum. This may be in part related incomplete distension although the possibility of a polypoid lesion would deserve consideration given the recent hemorrhage. Diverticulosis without diverticulitis. Diverticular change may be the etiology of the patient's right colon hemorrhage. Chronic changes as described above. Electronically Signed   By: Inez Catalina M.D.   On: 06/01/2015 07:30   Ir Angiogram Visceral Selective  06/01/2015  INDICATION: Acute lower GI bleeding originating in the cecum. EXAM: SELECTIVE VISCERAL ARTERIOGRAPHY; IR ULTRASOUND GUIDANCE VASC ACCESS RIGHT MEDICATIONS: 1% lidocaine locally. ANESTHESIA/SEDATION: Fentanyl 25 mcg IV; Versed 1.5 mg IV Moderate Sedation Time:  30 minutes The patient was continuously monitored during the procedure by the interventional radiology nurse under my direct supervision. CONTRAST:  63mL  OMNIPAQUE IOHEXOL 300 MG/ML  SOLN FLUOROSCOPY TIME:  Fluoroscopy Time: 7 minutes 48 seconds (241 mGy). COMPLICATIONS: None immediate. PROCEDURE: Informed consent was obtained from the patient following explanation of the procedure, risks, benefits and alternatives. The patient understands, agrees and consents for the procedure. All questions were addressed. A time out was performed prior to the initiation of the procedure. Maximal barrier sterile technique utilized including caps, mask, sterile gowns, sterile gloves, large sterile drape, hand hygiene, and Betadine prep. Under sterile conditions and local anesthesia, right common femoral artery ultrasound micropuncture access performed. Images obtained for documentation. Five French sheath inserted. Initially a C2 catheter was advanced into the aorta. Attempts were made to cannulate the SMA with a C2 catheter however this was unsuccessful. Catheter was exchanged for a Sos Omni select catheter. This catheter was reformed in the aorta and utilized to select the SMA. SMA angiograms performed in the frontal and oblique projections. SMA: Atherosclerotic changes of the SMA origin and trunk. SMA trunk is widely patent. Jejunal and colic branches are patent. 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 No evidence of active contrast extravasation or active bleeding in the right colon. On the venous phase, the superior mesenteric vein and portal vein are both patent. Catheter access removed. Hemostasis obtained with manual compression. No immediate complication. Patient tolerated the procedure well. IMPRESSION: Negative for active mesenteric bleeding in the right colon. Electronically Signed   By: Jerilynn Mages.  Shick M.D.   On: 06/01/2015 12:28   Ir US Guide Vasc Access Right  06/01/2015  INDICATION: Acute lower GI bleeding originating in the cecum. EXAM: SELECTIVE VISCERAL ARTERIOGRAPHY; IR ULTRASOUND GUIDANCE VASC ACCESS  RIGHT MEDICATIONS: 1% lidocaine locally. ANESTHESIA/SEDATION: Fentanyl 25 mcg IV; Versed 1.5 mg IV Moderate Sedation Time:  30 minutes The patient was continuously monitored during the procedure by the interventional radiology nurse under my direct supervision. CONTRAST:  4mL OMNIPAQUE IOHEXOL 300 MG/ML  SOLN FLUOROSCOPY TIME:  Fluoroscopy Time: 7 minutes 48 seconds (241 mGy). COMPLICATIONS: None immediate. PROCEDURE: Informed consent was obtained from the patient following explanation of the procedure, risks, benefits and alternatives. The patient understands, agrees and consents for the procedure. All questions were addressed. A time out was performed prior to the initiation of the procedure. Maximal barrier sterile technique utilized including caps, mask, sterile gowns, sterile gloves, large sterile drape, hand hygiene, and Betadine prep. Under sterile conditions and local anesthesia, right common femoral artery ultrasound micropuncture access performed. Images obtained for documentation. Five French sheath inserted. Initially a C2 catheter was advanced into the aorta. Attempts were made to cannulate the SMA with a C2 catheter however this was unsuccessful. Catheter was exchanged for a Sos Omni select catheter. This catheter  was reformed in the aorta and utilized to select the SMA. SMA angiograms performed in the frontal and oblique projections. SMA: Atherosclerotic changes of the SMA origin and trunk. SMA trunk is widely patent. Jejunal and colic branches are patent. 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 No evidence of active contrast extravasation or active bleeding in the right colon. On the venous phase, the superior mesenteric vein and portal vein are both patent. Catheter access removed. Hemostasis obtained with manual compression. No immediate complication. Patient tolerated the procedure well. IMPRESSION: Negative for active mesenteric  bleeding in the right colon. Electronically Signed   By: Jerilynn Mages.  Shick M.D.   On: 06/01/2015 12:28   Dg Chest Port 1 View  06/01/2015  CLINICAL DATA:  Gastrointestinal bleeding EXAM: PORTABLE CHEST 1 VIEW COMPARISON:  10/22/2013 FINDINGS: Heart is upper limits normal in size. Increasing bibasilar airspace opacities, likely atelectasis. Possible small left effusion. No acute bony abnormality. IMPRESSION: Increasing bibasilar opacities, likely atelectasis. Question small left effusion. Electronically Signed   By: Rolm Baptise M.D.   On: 06/01/2015 07:07       Assessment & Plan:   Problem List Items Addressed This Visit    Anemia    Recheck cbc and ferritin.        Relevant Orders   CBC with Differential/Platelet (Completed)   Ferritin (Completed)   Anxiety    On lexapro.  Follow.  Stable.       Essential hypertension, benign - Primary    Blood pressure under good control.  Continue same medication regimen.  Follow pressures.  Follow metabolic panel.        Relevant Orders   Basic metabolic panel (Completed)   GERD (gastroesophageal reflux disease)    On omeprazole.  Symptoms controlled.        Loss of weight    Weight stable from last check.  States eating.  No further bleeding.  Follow.       Neck nodule    Saw ENT.  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  She declined neurosurgery referral.  Seeing Dr Richardson Landry.        Rectal bleeding    No further bleeding.   W/up as outlined in previous note.  Felt not to be a good candidate for colonoscopy.  Recheck cbc today.           [name], MD

## 2015-10-28 NOTE — Progress Notes (Signed)
Pre-visit discussion using our clinic review tool. No additional management support is needed unless otherwise documented below in the visit note.  

## 2015-10-29 ENCOUNTER — Encounter: Payer: Self-pay | Admitting: Internal Medicine

## 2015-10-29 ENCOUNTER — Telehealth: Payer: Self-pay | Admitting: *Deleted

## 2015-10-29 NOTE — Assessment & Plan Note (Signed)
Blood pressure under good control.  Continue same medication regimen.  Follow pressures.  Follow metabolic panel.   

## 2015-10-29 NOTE — Assessment & Plan Note (Signed)
On omeprazole.  Symptoms controlled.  

## 2015-10-29 NOTE — Assessment & Plan Note (Signed)
On lexapro.  Follow.  Stable.

## 2015-10-29 NOTE — Telephone Encounter (Signed)
Faxed requested information to [place]. thanks

## 2015-10-29 NOTE — Assessment & Plan Note (Signed)
No further bleeding.   W/up as outlined in previous note.  Felt not to be a good candidate for colonoscopy.  Recheck cbc today.

## 2015-10-29 NOTE — Telephone Encounter (Signed)
Taylor Watson from [facility] has requested office notes from 06/27 to be faxed over to her office  Fax [phone]

## 2015-10-29 NOTE — Assessment & Plan Note (Signed)
Saw ENT.  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  She declined neurosurgery referral.  Seeing Dr Richardson Landry.

## 2015-10-29 NOTE — Assessment & Plan Note (Signed)
Weight stable from last check.  States eating.  No further bleeding.  Follow.

## 2015-10-29 NOTE — Assessment & Plan Note (Signed)
Recheck cbc and ferritin.   

## 2015-10-30 ENCOUNTER — Encounter: Payer: Self-pay | Admitting: *Deleted

## 2015-10-31 ENCOUNTER — Telehealth: Payer: Self-pay | Admitting: Internal Medicine

## 2015-10-31 NOTE — Telephone Encounter (Signed)
Notified Ferrous Sulfate 325mg  q day, per Dr. Bary Leriche note.

## 2015-10-31 NOTE — Telephone Encounter (Signed)
Pt's grand daughter called. She is not sure what type of iron pills she should get. Please call Orlando Penner, [phone].

## 2015-11-26 ENCOUNTER — Telehealth: Payer: Self-pay

## 2015-11-26 DIAGNOSIS — D509 Iron deficiency anemia, unspecified: Secondary | ICD-10-CM

## 2015-11-26 NOTE — Telephone Encounter (Signed)
Pt coming for labs 11/27/15. Need future orders placed. Thank you.

## 2015-11-27 ENCOUNTER — Other Ambulatory Visit (INDEPENDENT_AMBULATORY_CARE_PROVIDER_SITE_OTHER): Payer: Medicare Other

## 2015-11-27 DIAGNOSIS — D509 Iron deficiency anemia, unspecified: Secondary | ICD-10-CM

## 2015-11-27 LAB — CBC WITH DIFFERENTIAL/PLATELET
HCT: 37.5 % (ref 36.0–46.0)
Hemoglobin: 11.3 g/dL — ABNORMAL LOW (ref 12.0–15.0)
MCHC: 30.2 g/dL (ref 30.0–36.0)
MCV: 74.6 fl — ABNORMAL LOW (ref 78.0–100.0)
PLATELETS: 281 10*3/uL (ref 150.0–400.0)
RBC: 5.03 Mil/uL (ref 3.87–5.11)
RDW: 29 % — AB (ref 11.5–15.5)
WBC: 4.3 10*3/uL (ref 4.0–10.5)

## 2015-11-27 LAB — FERRITIN: FERRITIN: 32.5 ng/mL (ref 10.0–291.0)

## 2015-11-27 NOTE — Telephone Encounter (Signed)
Orders placed for f/u cbc and ferritin.   

## 2015-11-28 ENCOUNTER — Encounter: Payer: Self-pay | Admitting: *Deleted

## 2015-12-29 ENCOUNTER — Other Ambulatory Visit: Payer: Self-pay | Admitting: Internal Medicine

## 2016-02-04 ENCOUNTER — Ambulatory Visit (INDEPENDENT_AMBULATORY_CARE_PROVIDER_SITE_OTHER): Payer: Medicare Other

## 2016-02-04 VITALS — BP 122/64 | HR 64 | Temp 97.9°F | Resp 14 | Ht 64.0 in | Wt 122.0 lb

## 2016-02-04 DIAGNOSIS — Z0000 Encounter for general adult medical examination without abnormal findings: Secondary | ICD-10-CM | POA: Diagnosis not present

## 2016-02-04 NOTE — Progress Notes (Signed)
Subjective:   Taylor Watson is a 82 y.o. female who presents for Medicare Annual (Subsequent) preventive examination.  Review of Systems:  No ROS.  Medicare Wellness Visit.  Cardiac Risk Factors include: hypertension;advanced age (>8men, >25 women)     Objective:     Vitals: BP 122/64 (BP Location: Right Arm, Patient Position: Sitting, Cuff Size: Small)   Pulse 64   Temp 97.9 F (36.6 C) (Oral)   Resp 14   Ht 5\' 4"  (1.626 m)   Wt 122 lb (55.3 kg)   SpO2 95%   BMI 20.94 kg/m   Body mass index is 20.94 kg/m.   Tobacco History  Smoking Status  . Former Smoker  Smokeless Tobacco  . Former Systems developer  . Types: Chew     Counseling given: Not Answered   Past Medical History:  Diagnosis Date  . Allergy   . Anemia   . CVA (cerebral vascular accident) ([place])   . Depression   . GERD (gastroesophageal reflux disease)   . History of blood transfusion   . History of kidney stones   . Hx: UTI (urinary tract infection)   . Hypercholesterolemia   . Hypertension    Past Surgical History:  Procedure Laterality Date  . CHOLECYSTECTOMY  1984  . NECK LESION BIOPSY  2015   Followed by Dr. Richardson Landry   Family History  Problem Relation Age of Onset  . Cancer Mother     unknown type  . Hypertension Mother   . Cancer Father     unknown type  . Breast cancer Daughter   . Colon cancer      grandfather   History  Sexual Activity  . Sexual activity: Not Currently    Outpatient Encounter Prescriptions as of 02/04/2016  Medication Sig  . amLODipine (NORVASC) 2.5 MG tablet TAKE ONE TABLET BY MOUTH EVERY DAY  . escitalopram (LEXAPRO) 10 MG tablet TAKE ONE TABLET BY MOUTH EVERY DAY  . omeprazole (PRILOSEC) 20 MG capsule TAKE ONE CAPSULE BY MOUTH DAILY  . timolol (BETIMOL) 0.25 % ophthalmic solution 1-2 drops 2 (two) times daily.  . [DISCONTINUED] aspirin 81 MG tablet Take 81 mg by mouth daily.   No facility-administered encounter medications on file as of 02/04/2016.      Activities of Daily Living In your present state of health, do you have any difficulty performing the following activities: 02/04/2016 06/02/2015  Hearing? Tempie Donning  Vision? Y N  Difficulty concentrating or making decisions? Y N  Walking or climbing stairs? Y N  Dressing or bathing? N N  Doing errands, shopping? Y -  Conservation officer, nature and eating ? N -  Using the Toilet? N -  In the past six months, have you accidently leaked urine? Y -  Do you have problems with loss of bowel control? N -  Managing your Medications? N -  Managing your Finances? N -  Housekeeping or managing your Housekeeping? N -  Some recent data might be hidden    Patient Care Team: [name], MD as PCP - General (Internal Medicine)    Assessment:    This is a routine wellness examination for [place]. The goal of the wellness visit is to assist the patient how to close the gaps in care and create a preventative care plan for the patient.   Osteoporosis risk reviewed.  DEXA scan postponed per patient preference.    Medications reviewed; taking without issues or barriers.  HTN; controlled with medication.  Stable and  followed PCP. Educational material provided.  Safety issues reviewed; smoke and carbon monoxide detectors in the home. No firearms in the home. Wears seatbelts when riding with others. No violence in the home.  No identified risk were noted; The patient was oriented x 3; appropriate in dress and manner and no objective failures at ADL's or IADL's.   Body mass index; normal.  Discussed the importance of a healthy diet, water intake and exercise. She has a healthy diet and adequate water intake.  She will begin stretching, chair exercises and walk as often as possible.  Educational material provided.  Influenza, PNA, Zostavax, TDAP vaccines postponed, per patient preference.    Urinary frequency; wakes 3-4 times through the night to empty bladder.  Encouraged to watch diuretic intake through  the day and stop drinking fluids 2 hours prior to bedtime.  Follow up with PCP as needed.  Patient Concerns: Bladder leaks when ambulating or at rest.  Started wearing incontinent pad.  Routine appointment scheduled with PCP at the end of this month.  Deferred to PCP for follow up.  Exercise Activities and Dietary recommendations Current Exercise Habits: The patient does not participate in regular exercise at present  Goals    . Increase physical activity          STRETCH AND CHAIR EXERCISE, AS DEMONSTRATED.  AS TOLERATED.  EDUCATIONAL MATERIAL PROVIDED.      Fall Risk Fall Risk  02/04/2016 01/09/2014 01/09/2014 10/22/2012  Falls in the past year? No Yes Yes No  Number falls in past yr: - 1 1 -  Injury with Fall? - No - -  Risk for fall due to : - - Impaired balance/gait -   Depression Screen PHQ 2/9 Scores 02/04/2016 01/09/2014 01/09/2014 10/22/2012  PHQ - 2 Score 0 0 1 0     Cognitive Testing MMSE - Mini Mental State Exam 02/04/2016  Orientation to time 5  Orientation to Place 5  Registration 3  Attention/ Calculation 5  Recall 3  Language- name 2 objects 2  Language- repeat 1  Language- follow 3 step command 3  Language- read & follow direction 1  Write a sentence 1  Copy design 1  Total score 30    Immunization History  Administered Date(s) Administered  . PPD Test 11/06/2013   Screening Tests Health Maintenance  Topic Date Due  . FOOT EXAM  12/25/1930  . OPHTHALMOLOGY EXAM  12/25/1930  . URINE MICROALBUMIN  12/25/1930  . HEMOGLOBIN A1C  04/05/2014  . INFLUENZA VACCINE  07/31/2016 (Originally 12/02/2015)  . DEXA SCAN  02/03/2017 (Originally 12/24/1985)  . ZOSTAVAX  02/03/2017 (Originally 12/24/1980)  . TETANUS/TDAP  02/03/2017 (Originally 12/25/1939)  . PNA vac Low Risk Adult (1 of 2 - PCV13) 02/03/2017 (Originally 12/24/1985)  . MAMMOGRAM  05/27/2020 (Originally [birth year]/1938)      Plan:   End of life planning; Advance aging; Advanced directives discussed. No  HCPOA/Living Will.  Educational material provided to help her start the conversation with her family.  Copy of completed forms requested.  Time spent on this topic is 20 minutes.  Medicare Attestation I have personally reviewed: The patient's medical and social history Their use of alcohol, tobacco or illicit drugs Their current medications and supplements The patient's functional ability including ADLs,fall risks, home safety risks, cognitive, and hearing and visual impairment Diet and physical activities Evidence for depression   The patient's weight, height, BMI, and visual acuity have been recorded in the chart.  I have made referrals and  provided education to the patient based on review of the above and I have provided the patient with a written personalized care plan for preventive services.    During the course of the visit the patient was educated and counseled about the following appropriate screening and preventive services:   Vaccines to include Pneumoccal, Influenza, Hepatitis B, Td, Zostavax, HCV  Electrocardiogram  Cardiovascular Disease  Colorectal cancer screening  Bone density screening  Diabetes screening  Glaucoma screening  Mammography/PAP  Nutrition counseling   Patient Instructions (the written plan) was given to the patient.   Varney Biles, LPN  75/11/3223   Reviewed.    Dr Nicki Reaper

## 2016-02-04 NOTE — Patient Instructions (Addendum)
Taylor Watson , Thank you for taking time to come for your Medicare Wellness Visit. I appreciate your ongoing commitment to your health goals. Please review the following plan we discussed and let me know if I can assist you in the future.   FOLLOW UP WITH DR. Nicki Reaper AS NEEDED. Return in 3 weeks for scheduled appointment.  ADD BATTERIES TO SMOKE AND CARBON MONOXIDE DETECTORS.  READ OVER LIVING WILL INFORMATION WITH FAMILY.  These are the goals we discussed: Goals    . Increase physical activity          STRETCH AND CHAIR EXERCISE, AS DEMONSTRATED.  AS TOLERATED.  EDUCATIONAL MATERIAL PROVIDED.       This is a list of the screening recommended for you and due dates:  Health Maintenance  Topic Date Due  . Complete foot exam   12/[birth year]/1930  . Eye exam for diabetics  12/25/1928  . Urine Protein Check  12/25/1930  . Hemoglobin A1C  04/05/2014  . Flu Shot  07/31/2016*  . DEXA scan (bone density measurement)  02/03/2017*  . Shingles Vaccine  02/03/2017*  . Tetanus Vaccine  02/03/2017*  . Pneumonia vaccines (1 of 2 - PCV13) 02/03/2017*  . Mammogram  05/27/2020*  *Topic was postponed. The date shown is not the original due date.     Bone Densitometry Bone densitometry is an imaging test that uses a special X-ray to measure the amount of calcium and other minerals in your bones (bone density). This test is also known as a bone mineral density test or dual-energy X-ray absorptiometry (DXA). The test can measure bone density at your hip and your spine. It is similar to having a regular X-ray. You may have this test to:  Diagnose a condition that causes weak or thin bones (osteoporosis).  Predict your risk of a broken bone (fracture).  Determine how well osteoporosis treatment is working. LET [facility] CARE PROVIDER KNOW ABOUT:  Any allergies you have.  All medicines you are taking, including vitamins, herbs, eye drops, creams, and over-the-counter medicines.  Previous problems  you or members of your family have had with the use of anesthetics.  Any blood disorders you have.  Previous surgeries you have had.  Medical conditions you have.  Possibility of pregnancy.  Any other medical test you had within the previous 14 days that used contrast material. RISKS AND COMPLICATIONS Generally, this is a safe procedure. However, problems can occur and may include the following:  This test exposes you to a very small amount of radiation.  The risks of radiation exposure may be greater to unborn children. BEFORE THE PROCEDURE  Do not take any calcium supplements for 24 hours before having the test. You can otherwise eat and drink what you usually do.  Take off all metal jewelry, eyeglasses, dental appliances, and any other metal objects. PROCEDURE  You may lie on an exam table. There will be an X-ray generator below you and an imaging device above you.  Other devices, such as boxes or braces, may be used to position your body properly for the scan.  You will need to lie still while the machine slowly scans your body.  The images will show up on a computer monitor. AFTER THE PROCEDURE You may need more testing at a later time.   This information is not intended to replace advice given to you by your health care provider. Make sure you discuss any questions you have with your health care provider.   Document  Released: 05/11/2004 Document Revised: 05/10/2014 Document Reviewed: 09/27/2013 Elsevier Interactive Patient Education 2016 [place] in the Home  Falls can cause injuries. They can happen to people of all ages. There are many things you can do to make your home safe and to help prevent falls.  WHAT CAN I DO ON THE OUTSIDE OF MY HOME?  Regularly fix the edges of walkways and driveways and fix any cracks.  Remove anything that might make you trip as you walk through a door, such as a raised step or threshold.  Trim any bushes or  trees on the path to your home.  Use bright outdoor lighting.  Clear any walking paths of anything that might make someone trip, such as rocks or tools.  Regularly check to see if handrails are loose or broken. Make sure that both sides of any steps have handrails.  Any raised decks and porches should have guardrails on the edges.  Have any leaves, snow, or ice cleared regularly.  Use sand or salt on walking paths during winter.  Clean up any spills in your garage right away. This includes oil or grease spills. WHAT CAN I DO IN THE BATHROOM?   Use night lights.  Install grab bars by the toilet and in the tub and shower. Do not use towel bars as grab bars.  Use non-skid mats or decals in the tub or shower.  If you need to sit down in the shower, use a plastic, non-slip stool.  Keep the floor dry. Clean up any water that spills on the floor as soon as it happens.  Remove soap buildup in the tub or shower regularly.  Attach bath mats securely with double-sided non-slip rug tape.  Do not have throw rugs and other things on the floor that can make you trip. WHAT CAN I DO IN THE BEDROOM?  Use night lights.  Make sure that you have a light by your bed that is easy to reach.  Do not use any sheets or blankets that are too big for your bed. They should not hang down onto the floor.  Have a firm chair that has side arms. You can use this for support while you get dressed.  Do not have throw rugs and other things on the floor that can make you trip. WHAT CAN I DO IN THE KITCHEN?  Clean up any spills right away.  Avoid walking on wet floors.  Keep items that you use a lot in easy-to-reach places.  If you need to reach something above you, use a strong step stool that has a grab bar.  Keep electrical cords out of the way.  Do not use floor polish or wax that makes floors slippery. If you must use wax, use non-skid floor wax.  Do not have throw rugs and other things on the  floor that can make you trip. WHAT CAN I DO WITH MY STAIRS?  Do not leave any items on the stairs.  Make sure that there are handrails on both sides of the stairs and use them. Fix handrails that are broken or loose. Make sure that handrails are as long as the stairways.  Check any carpeting to make sure that it is firmly attached to the stairs. Fix any carpet that is loose or worn.  Avoid having throw rugs at the top or bottom of the stairs. If you do have throw rugs, attach them to the floor with carpet tape.  Make sure  that you have a light switch at the top of the stairs and the bottom of the stairs. If you do not have them, ask someone to add them for you. WHAT ELSE CAN I DO TO HELP PREVENT FALLS?  Wear shoes that:  Do not have high heels.  Have rubber bottoms.  Are comfortable and fit you well.  Are closed at the toe. Do not wear sandals.  If you use a stepladder:  Make sure that it is fully opened. Do not climb a closed stepladder.  Make sure that both sides of the stepladder are locked into place.  Ask someone to hold it for you, if possible.  Clearly mark and make sure that you can see:  Any grab bars or handrails.  First and last steps.  Where the edge of each step is.  Use tools that help you move around (mobility aids) if they are needed. These include:  Canes.  Walkers.  Scooters.  Crutches.  Turn on the lights when you go into a dark area. Replace any light bulbs as soon as they burn out.  Set up your furniture so you have a clear path. Avoid moving your furniture around.  If any of your floors are uneven, fix them.  If there are any pets around you, be aware of where they are.  Review your medicines with your doctor. Some medicines can make you feel dizzy. This can increase your chance of falling. Ask your doctor what other things that you can do to help prevent falls.   This information is not intended to replace advice given to you by your  health care provider. Make sure you discuss any questions you have with your health care provider.   Document Released: 02/13/2009 Document Revised: 09/03/2014 Document Reviewed: 05/24/2014 Elsevier Interactive Patient Education [employer].

## 2016-03-01 ENCOUNTER — Ambulatory Visit (INDEPENDENT_AMBULATORY_CARE_PROVIDER_SITE_OTHER): Payer: Medicare Other | Admitting: Internal Medicine

## 2016-03-01 ENCOUNTER — Encounter: Payer: Self-pay | Admitting: Internal Medicine

## 2016-03-01 VITALS — BP 160/80 | HR 44 | Temp 98.2°F | Ht 64.0 in | Wt 121.6 lb

## 2016-03-01 DIAGNOSIS — D649 Anemia, unspecified: Secondary | ICD-10-CM

## 2016-03-01 DIAGNOSIS — I10 Essential (primary) hypertension: Secondary | ICD-10-CM

## 2016-03-01 DIAGNOSIS — R0609 Other forms of dyspnea: Secondary | ICD-10-CM | POA: Diagnosis not present

## 2016-03-01 DIAGNOSIS — R55 Syncope and collapse: Secondary | ICD-10-CM

## 2016-03-01 DIAGNOSIS — R42 Dizziness and giddiness: Secondary | ICD-10-CM | POA: Diagnosis not present

## 2016-03-01 DIAGNOSIS — K219 Gastro-esophageal reflux disease without esophagitis: Secondary | ICD-10-CM

## 2016-03-01 DIAGNOSIS — E7800 Pure hypercholesterolemia, unspecified: Secondary | ICD-10-CM

## 2016-03-01 DIAGNOSIS — F419 Anxiety disorder, unspecified: Secondary | ICD-10-CM | POA: Diagnosis not present

## 2016-03-01 DIAGNOSIS — R634 Abnormal weight loss: Secondary | ICD-10-CM

## 2016-03-01 DIAGNOSIS — R9431 Abnormal electrocardiogram [ECG] [EKG]: Secondary | ICD-10-CM | POA: Diagnosis not present

## 2016-03-01 LAB — CBC WITH DIFFERENTIAL/PLATELET
BASOS ABS: 0 10*3/uL (ref 0.0–0.1)
BASOS PCT: 1 % (ref 0.0–3.0)
EOS ABS: 0.3 10*3/uL (ref 0.0–0.7)
Eosinophils Relative: 8.2 % — ABNORMAL HIGH (ref 0.0–5.0)
HCT: 42.7 % (ref 36.0–46.0)
HEMOGLOBIN: 13.8 g/dL (ref 12.0–15.0)
LYMPHS PCT: 29.3 % (ref 12.0–46.0)
Lymphs Abs: 1.2 10*3/uL (ref 0.7–4.0)
MCHC: 32.4 g/dL (ref 30.0–36.0)
MCV: 84.5 fl (ref 78.0–100.0)
MONOS PCT: 7.5 % (ref 3.0–12.0)
Monocytes Absolute: 0.3 10*3/uL (ref 0.1–1.0)
Neutro Abs: 2.2 10*3/uL (ref 1.4–7.7)
Neutrophils Relative %: 54 % (ref 43.0–77.0)
Platelets: 207 10*3/uL (ref 150.0–400.0)
RBC: 5.05 Mil/uL (ref 3.87–5.11)
RDW: 15.9 % — AB (ref 11.5–15.5)
WBC: 4 10*3/uL (ref 4.0–10.5)

## 2016-03-01 LAB — BASIC METABOLIC PANEL
BUN: 9 mg/dL (ref 6–23)
CHLORIDE: 99 meq/L (ref 96–112)
CO2: 35 mEq/L — ABNORMAL HIGH (ref 19–32)
Calcium: 10 mg/dL (ref 8.4–10.5)
Creatinine, Ser: 0.77 mg/dL (ref 0.40–1.20)
GFR: 89.55 mL/min (ref >60.00)
GLUCOSE: 83 mg/dL (ref 70–99)
POTASSIUM: 3.6 meq/L (ref 3.5–5.1)
Sodium: 142 mEq/L (ref 135–145)

## 2016-03-01 LAB — HEPATIC FUNCTION PANEL
ALBUMIN: 3.9 g/dL (ref 3.5–5.2)
ALK PHOS: 74 U/L (ref 39–117)
ALT: 6 U/L (ref 0–35)
AST: 12 U/L (ref 0–37)
Bilirubin, Direct: 0.1 mg/dL (ref 0.0–0.3)
TOTAL PROTEIN: 7.4 g/dL (ref 6.0–8.3)
Total Bilirubin: 1 mg/dL (ref 0.2–1.2)

## 2016-03-01 LAB — MAGNESIUM: MAGNESIUM: 1.8 mg/dL (ref 1.5–2.5)

## 2016-03-01 LAB — FERRITIN: Ferritin: 65.4 ng/mL (ref 10.0–291.0)

## 2016-03-01 LAB — TSH: TSH: 3.2 u[IU]/mL (ref 0.35–4.50)

## 2016-03-01 MED ORDER — AMLODIPINE BESYLATE 2.5 MG PO TABS: 2.5000 mg | ORAL_TABLET | Freq: Every day | ORAL | 2 refills | Status: DC

## 2016-03-01 MED ORDER — OMEPRAZOLE 20 MG PO CPDR: 20.0000 mg | DELAYED_RELEASE_CAPSULE | Freq: Every day | ORAL | 5 refills | Status: DC

## 2016-03-01 NOTE — Progress Notes (Signed)
Pre visit review using our clinic review tool, if applicable. No additional management support is needed unless otherwise documented below in the visit note. 

## 2016-03-01 NOTE — Progress Notes (Signed)
Patient ID: Taylor Watson, female   DOB: Jun 17, 1918, 82 y.o.   MRN: [id number]   Subjective:    Patient ID: Taylor Watson, female    DOB: 1919-02-18, 82 y.o.   MRN: [id number]  HPI  Patient here for a scheduled follow up.  She is accompanied by her daughter.  History obtained from both of them.  She has been having weak spells.  Some dizzy episodes and sob with exertion.  She was questioning if the feelings were related to her lexapro.  She takes 10mg  qod.  We discussed decreasing to 1/2 tablet qod.  Also discussed further w/up.  EKG - SR with LAD and nonspecific Twave abnormality.  Agreed to referral to cardiology.  May need holter.  Does not want anything real aggressive.  No acid reflux.  No abdominal pain or cramping.  No further bleeding.     Past Medical History:  Diagnosis Date  . Allergy   . Anemia   . CVA (cerebral vascular accident) ([place])   . Depression   . GERD (gastroesophageal reflux disease)   . History of blood transfusion   . History of kidney stones   . Hx: UTI (urinary tract infection)   . Hypercholesterolemia   . Hypertension    Past Surgical History:  Procedure Laterality Date  . CHOLECYSTECTOMY  1984  . NECK LESION BIOPSY  2015   Followed by Dr. Richardson Landry   Family History  Problem Relation Age of Onset  . Cancer Mother     unknown type  . Hypertension Mother   . Cancer Father     unknown type  . Breast cancer Daughter   . Colon cancer      grandfather   Social History   Social History  . Marital status: Widowed    Spouse name: N/A  . Number of children: 5  . Years of education: N/A   Social History Main Topics  . Smoking status: Former Research scientist (life sciences)  . Smokeless tobacco: Former Systems developer    Types: Chew  . Alcohol use No  . Drug use: No  . Sexual activity: Not Currently   Other Topics Concern  . None   Social History Narrative  . None    Outpatient Encounter Prescriptions as of 03/01/2016  Medication Sig  . amLODipine (NORVASC) 2.5 MG  tablet Take 1 tablet (2.5 mg total) by mouth daily.  Marland Kitchen escitalopram (LEXAPRO) 10 MG tablet TAKE ONE TABLET BY MOUTH EVERY DAY  . omeprazole (PRILOSEC) 20 MG capsule Take 1 capsule (20 mg total) by mouth daily.  . timolol (BETIMOL) 0.25 % ophthalmic solution 1-2 drops 2 (two) times daily.  . [DISCONTINUED] amLODipine (NORVASC) 2.5 MG tablet TAKE ONE TABLET BY MOUTH EVERY DAY  . [DISCONTINUED] omeprazole (PRILOSEC) 20 MG capsule TAKE ONE CAPSULE BY MOUTH DAILY   No facility-administered encounter medications on file as of 03/01/2016.     Review of Systems  Constitutional: Positive for fatigue. Negative for appetite change and unexpected weight change.  HENT: Negative for congestion and sinus pressure.   Respiratory: Positive for shortness of breath. Negative for cough and chest tightness.        Sob with exertion.    Cardiovascular: Negative for chest pain, palpitations and leg swelling.  Gastrointestinal: Negative for abdominal pain, diarrhea, nausea and vomiting.  Genitourinary: Negative for difficulty urinating and dysuria.  Musculoskeletal: Negative for back pain and joint swelling.  Skin: Negative for color change and rash.  Neurological: Positive for light-headedness. Negative for headaches.  Feels like going to pass out at times.    Psychiatric/Behavioral: Negative for agitation and dysphoric mood.       Objective:   pulse recheck:  80  Physical Exam  Constitutional: She appears well-developed and well-nourished. No distress.  HENT:  Nose: Nose normal.  Mouth/Throat: Oropharynx is clear and moist.  Neck: Neck supple. No thyromegaly present.  Cardiovascular: Normal rate and regular rhythm.   Pulmonary/Chest: Breath sounds normal. No respiratory distress. She has no wheezes.  Abdominal: Soft. Bowel sounds are normal. There is no tenderness.  Musculoskeletal: She exhibits no edema or tenderness.  Lymphadenopathy:    She has no cervical adenopathy.  Skin: No rash noted.  No erythema.  Psychiatric: She has a normal mood and affect. Her behavior is normal.    BP (!) 160/80   Pulse (!) 44   Temp 98.2 F (36.8 C) (Oral)   Ht 5\' 4"  (1.626 m)   Wt 121 lb 9.6 oz (55.2 kg)   SpO2 90%   BMI 20.87 kg/m  Wt Readings from Last 3 Encounters:  03/01/16 121 lb 9.6 oz (55.2 kg)  02/04/16 122 lb (55.3 kg)  10/28/15 122 lb 6 oz (55.5 kg)     Lab Results  Component Value Date   WBC 4.0 03/01/2016   HGB 13.8 03/01/2016   HCT 42.7 03/01/2016   PLT 207.0 03/01/2016   GLUCOSE 83 03/01/2016   CHOL 254 (H) 05/28/2015   TRIG 100.0 05/28/2015   HDL 58.40 05/28/2015   LDLDIRECT 163.5 01/12/2013   LDLCALC 176 (H) 05/28/2015   ALT 6 03/01/2016   AST 12 03/01/2016   NA 142 03/01/2016   K 3.6 03/01/2016   CL 99 03/01/2016   CREATININE 0.77 03/01/2016   BUN 9 03/01/2016   CO2 35 (H) 03/01/2016   TSH 3.20 03/01/2016   INR 1.38 06/01/2015   HGBA1C 5.7 10/04/2013    Nm Gi Blood Loss  Result Date: 05/31/2015 CLINICAL DATA:  Patient with multiple episodes of blood within the stool. Week and fatigue. Low hemoglobin. EXAM: NUCLEAR MEDICINE GASTROINTESTINAL BLEEDING SCAN TECHNIQUE: Sequential abdominal images were obtained following intravenous administration of Tc-41m labeled red blood cells. RADIOPHARMACEUTICALS:  21.7 mCi Tc-75m in-vitro labeled red cells. COMPARISON:  None. FINDINGS: Radiotracer is demonstrated within the expected location of the cecum and ascending colon progressing distally into the transverse and proximal descending colon. Tracer is demonstrated within the vasculature as well as within the bladder. IMPRESSION: Positive bleeding examination, with origin likely within the region of the cecum/proximal ascending colon. Critical Value/emergent results were called by telephone at the time of interpretation on 05/31/2015 at 7:08 pm to Dr. Abel Presto , who verbally acknowledged these results. Electronically Signed   By: Lovey Newcomer M.D.   On: 05/31/2015 19:09    Ct Abdomen Pelvis W Contrast  Result Date: 06/01/2015 CLINICAL DATA:  Colonic GI bleed EXAM: CT ABDOMEN AND PELVIS WITH CONTRAST TECHNIQUE: Multidetector CT imaging of the abdomen and pelvis was performed using the standard protocol following bolus administration of intravenous contrast. CONTRAST:  90mL OMNIPAQUE IOHEXOL 300 MG/ML  SOLN COMPARISON:  05/31/2015 FINDINGS: Lung bases demonstrate bibasilar atelectasis without sizable effusions. Gallbladder is been surgically removed. The liver demonstrates a 2.2 cmhepatic cyst in the left lobe stable from the prior exam. The spleen, adrenal glands and pancreas are within normal limits with the exception of some mild calcifications within the pancreas which are stable in appearance from the prior exam. The kidneys demonstrate a normal enhancement pattern.  Prominent extrarenal pelves are noted bilaterally. These findings are stable from the prior exam. No obstructive changes are seen. Bladder is well distended. The colon demonstrates diffuse diverticular change without evidence of diverticulitis. The appendix is filled with barium. The ascending colon demonstrates diverticular change which may be the etiology of the patient's underlying hemorrhage. Additionally a filling defect is noted within the barium in the cecum in the possibility of an underlying polypoid lesion cannot be totally excluded. Diffuse aortoiliac calcifications are noted without aneurysmal dilatation. Calcified uterine fibroids are seen. The osseous structures show no acute abnormality. IMPRESSION: Filling defect within the cecum. This may be in part related incomplete distension although the possibility of a polypoid lesion would deserve consideration given the recent hemorrhage. Diverticulosis without diverticulitis. Diverticular change may be the etiology of the patient's right colon hemorrhage. Chronic changes as described above. Electronically Signed   By: Inez Catalina M.D.   On: 06/01/2015  07:30   Ir Angiogram Visceral Selective  Result Date: 06/01/2015 INDICATION: Acute lower GI bleeding originating in the cecum. EXAM: SELECTIVE VISCERAL ARTERIOGRAPHY; IR ULTRASOUND GUIDANCE VASC ACCESS RIGHT MEDICATIONS: 1% lidocaine locally. ANESTHESIA/SEDATION: Fentanyl 25 mcg IV; Versed 1.5 mg IV Moderate Sedation Time:  30 minutes The patient was continuously monitored during the procedure by the interventional radiology nurse under my direct supervision. CONTRAST:  23mL OMNIPAQUE IOHEXOL 300 MG/ML  SOLN FLUOROSCOPY TIME:  Fluoroscopy Time: 7 minutes 48 seconds (241 mGy). COMPLICATIONS: None immediate. PROCEDURE: Informed consent was obtained from the patient following explanation of the procedure, risks, benefits and alternatives. The patient understands, agrees and consents for the procedure. All questions were addressed. A time out was performed prior to the initiation of the procedure. Maximal barrier sterile technique utilized including caps, mask, sterile gowns, sterile gloves, large sterile drape, hand hygiene, and Betadine prep. Under sterile conditions and local anesthesia, right common femoral artery ultrasound micropuncture access performed. Images obtained for documentation. Five French sheath inserted. Initially a C2 catheter was advanced into the aorta. Attempts were made to cannulate the SMA with a C2 catheter however this was unsuccessful. Catheter was exchanged for a Sos Omni select catheter. This catheter was reformed in the aorta and utilized to select the SMA. SMA angiograms performed in the frontal and oblique projections. SMA: Atherosclerotic changes of the SMA origin and trunk. SMA trunk is widely patent. Jejunal and colic branches are patent. 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 No evidence of active contrast extravasation or active bleeding in the right colon. On the venous phase, the superior mesenteric vein and portal  vein are both patent. Catheter access removed. Hemostasis obtained with manual compression. No immediate complication. Patient tolerated the procedure well. IMPRESSION: Negative for active mesenteric bleeding in the right colon. Electronically Signed   By: Jerilynn Mages.  Shick M.D.   On: 06/01/2015 12:28   Ir US Guide Vasc Access Right  Result Date: 06/01/2015 INDICATION: Acute lower GI bleeding originating in the cecum. EXAM: SELECTIVE VISCERAL ARTERIOGRAPHY; IR ULTRASOUND GUIDANCE VASC ACCESS RIGHT MEDICATIONS: 1% lidocaine locally. ANESTHESIA/SEDATION: Fentanyl 25 mcg IV; Versed 1.5 mg IV Moderate Sedation Time:  30 minutes The patient was continuously monitored during the procedure by the interventional radiology nurse under my direct supervision. CONTRAST:  2mL OMNIPAQUE IOHEXOL 300 MG/ML  SOLN FLUOROSCOPY TIME:  Fluoroscopy Time: 7 minutes 48 seconds (241 mGy). COMPLICATIONS: None immediate. PROCEDURE: Informed consent was obtained from the patient following explanation of the procedure, risks, benefits and alternatives. The patient understands,  agrees and consents for the procedure. All questions were addressed. A time out was performed prior to the initiation of the procedure. Maximal barrier sterile technique utilized including caps, mask, sterile gowns, sterile gloves, large sterile drape, hand hygiene, and Betadine prep. Under sterile conditions and local anesthesia, right common femoral artery ultrasound micropuncture access performed. Images obtained for documentation. Five French sheath inserted. Initially a C2 catheter was advanced into the aorta. Attempts were made to cannulate the SMA with a C2 catheter however this was unsuccessful. Catheter was exchanged for a Sos Omni select catheter. This catheter was reformed in the aorta and utilized to select the SMA. SMA angiograms performed in the frontal and oblique projections. SMA: Atherosclerotic changes of the SMA origin and trunk. SMA trunk is widely  patent. Jejunal and colic branches are patent. 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 No evidence of active contrast extravasation or active bleeding in the right colon. On the venous phase, the superior mesenteric vein and portal vein are both patent. Catheter access removed. Hemostasis obtained with manual compression. No immediate complication. Patient tolerated the procedure well. IMPRESSION: Negative for active mesenteric bleeding in the right colon. Electronically Signed   By: Jerilynn Mages.  Shick M.D.   On: 06/01/2015 12:28   Dg Chest Port 1 View  Result Date: 06/01/2015 CLINICAL DATA:  Gastrointestinal bleeding EXAM: PORTABLE CHEST 1 VIEW COMPARISON:  10/22/2013 FINDINGS: Heart is upper limits normal in size. Increasing bibasilar airspace opacities, likely atelectasis. Possible small left effusion. No acute bony abnormality. IMPRESSION: Increasing bibasilar opacities, likely atelectasis. Question small left effusion. Electronically Signed   By: Rolm Baptise M.D.   On: 06/01/2015 07:07       Assessment & Plan:   Problem List Items Addressed This Visit    Anemia    Recheck cbc.        Relevant Orders   CBC with Differential/Platelet (Completed)   Ferritin (Completed)   Anxiety    On lexapro.  Was questioning if the lexapro could be contributing to her symptoms.  Change lexapro to 5mg  qod.  Follow.        Essential hypertension, benign    Blood pressure elevated today.  Has been well controlled.  Hold on making changes.  States taking medications regularly.  Follow.  Check metabolic panel.        Relevant Medications   amLODipine (NORVASC) 2.5 MG tablet   GERD (gastroesophageal reflux disease)    On omeprazole.  Symptoms controlled.        Relevant Medications   omeprazole (PRILOSEC) 20 MG capsule   Hypercholesterolemia     Follow lipid panel.        Relevant Medications   amLODipine (NORVASC) 2.5 MG tablet   Loss of  weight    Weight is stable from previous two checks.  States she is eating.  Follow.        Near syncope - Primary    Symptoms as outlined.  EKG as outlined.  Discussed further w/up.  Obtain labs as outlined.  Refer to cardiology for further evaluation and question of need for further w/up including echo, holter, etc.  Pt and daughter in agreement.        Relevant Medications   amLODipine (NORVASC) 2.5 MG tablet   Other Relevant Orders   EKG 12-Lead (Completed)   Basic metabolic panel (Completed)   Hepatic function panel (Completed)   TSH (Completed)    Other Visit Diagnoses  Hypomagnesemia       Relevant Orders   Magnesium (Completed)       [name], MD

## 2016-03-07 ENCOUNTER — Encounter: Payer: Self-pay | Admitting: Internal Medicine

## 2016-03-07 NOTE — Assessment & Plan Note (Signed)
Blood pressure elevated today.  Has been well controlled.  Hold on making changes.  States taking medications regularly.  Follow.  Check metabolic panel.

## 2016-03-07 NOTE — Assessment & Plan Note (Signed)
On lexapro.  Was questioning if the lexapro could be contributing to her symptoms.  Change lexapro to 5mg  qod.  Follow.

## 2016-03-07 NOTE — Assessment & Plan Note (Signed)
Weight is stable from previous two checks.  States she is eating.  Follow.

## 2016-03-07 NOTE — Assessment & Plan Note (Signed)
Recheck cbc.  

## 2016-03-07 NOTE — Assessment & Plan Note (Signed)
On omeprazole.  Symptoms controlled.  

## 2016-03-07 NOTE — Assessment & Plan Note (Signed)
Follow lipid panel.   

## 2016-03-07 NOTE — Assessment & Plan Note (Signed)
Symptoms as outlined.  EKG as outlined.  Discussed further w/up.  Obtain labs as outlined.  Refer to cardiology for further evaluation and question of need for further w/up including echo, holter, etc.  Pt and daughter in agreement.

## 2016-03-09 DIAGNOSIS — R42 Dizziness and giddiness: Secondary | ICD-10-CM | POA: Diagnosis not present

## 2016-03-10 DIAGNOSIS — R9431 Abnormal electrocardiogram [ECG] [EKG]: Secondary | ICD-10-CM | POA: Diagnosis not present

## 2016-03-10 DIAGNOSIS — R42 Dizziness and giddiness: Secondary | ICD-10-CM | POA: Diagnosis not present

## 2016-03-10 DIAGNOSIS — I10 Essential (primary) hypertension: Secondary | ICD-10-CM | POA: Diagnosis not present

## 2016-03-10 DIAGNOSIS — R0609 Other forms of dyspnea: Secondary | ICD-10-CM | POA: Diagnosis not present

## 2016-03-17 ENCOUNTER — Ambulatory Visit (INDEPENDENT_AMBULATORY_CARE_PROVIDER_SITE_OTHER): Payer: Medicare Other | Admitting: Internal Medicine

## 2016-03-17 ENCOUNTER — Encounter: Payer: Self-pay | Admitting: Internal Medicine

## 2016-03-17 ENCOUNTER — Ambulatory Visit (INDEPENDENT_AMBULATORY_CARE_PROVIDER_SITE_OTHER): Payer: Medicare Other

## 2016-03-17 VITALS — BP 160/70 | HR 113 | Temp 97.8°F | Resp 18 | Wt 117.4 lb

## 2016-03-17 DIAGNOSIS — R221 Localized swelling, mass and lump, neck: Secondary | ICD-10-CM | POA: Diagnosis not present

## 2016-03-17 DIAGNOSIS — R55 Syncope and collapse: Secondary | ICD-10-CM | POA: Diagnosis not present

## 2016-03-17 DIAGNOSIS — K625 Hemorrhage of anus and rectum: Secondary | ICD-10-CM

## 2016-03-17 DIAGNOSIS — D649 Anemia, unspecified: Secondary | ICD-10-CM

## 2016-03-17 DIAGNOSIS — K219 Gastro-esophageal reflux disease without esophagitis: Secondary | ICD-10-CM

## 2016-03-17 DIAGNOSIS — R634 Abnormal weight loss: Secondary | ICD-10-CM

## 2016-03-17 DIAGNOSIS — R0602 Shortness of breath: Secondary | ICD-10-CM

## 2016-03-17 DIAGNOSIS — I10 Essential (primary) hypertension: Secondary | ICD-10-CM

## 2016-03-17 DIAGNOSIS — J439 Emphysema, unspecified: Secondary | ICD-10-CM | POA: Diagnosis not present

## 2016-03-17 DIAGNOSIS — F419 Anxiety disorder, unspecified: Secondary | ICD-10-CM

## 2016-03-17 NOTE — Progress Notes (Signed)
Pre visit review using our clinic review tool, if applicable. No additional management support is needed unless otherwise documented below in the visit note. 

## 2016-03-17 NOTE — Progress Notes (Signed)
Patient ID: Taylor Watson, female   DOB: 11-11-1918, 82 y.o.   MRN: [id number]   Subjective:    Patient ID: Taylor Watson, female    DOB: 01-21-1919, 82 y.o.   MRN: [id number]  HPI  Patient here for a scheduled follow up.  She is accompanied by her daughter.  History obtained from both of them.  She has been having some dyspnea on exertion and also weak episodes.  See last note and cardiology note for details.  Evaluated by cardiology.  Had holter monitor.  No significant arrhythmia noted.  Cardiology felt no further w/up warranted.  We also decreased her lexapro.  She still reports having similar "episodes".  No chest pain.  Reports some dysphagia at times.  Some decreased appetite.  Some constipation.  miralax helps.  Previously was hospitalized with GI bleed.  CT abdomen revealed filling defect within the cecum.  Was not felt to be a good candidate for colonoscopy.   She has had no further bleeding.  Question if some of her current symptoms could be related to findings on CT.  Discussed further w/up.  She declines.  Did agree to cxr.  Daughter that accompanies her is Esther [phone].   Past Medical History:  Diagnosis Date  . Allergy   . Anemia   . CVA (cerebral vascular accident) ([place])   . Depression   . GERD (gastroesophageal reflux disease)   . History of blood transfusion   . History of kidney stones   . Hx: UTI (urinary tract infection)   . Hypercholesterolemia   . Hypertension    Past Surgical History:  Procedure Laterality Date  . CHOLECYSTECTOMY  1984  . NECK LESION BIOPSY  2015   Followed by Dr. Richardson Landry   Family History  Problem Relation Age of Onset  . Cancer Mother     unknown type  . Hypertension Mother   . Cancer Father     unknown type  . Breast cancer Daughter   . Colon cancer      grandfather   Social History   Social History  . Marital status: Widowed    Spouse name: N/A  . Number of children: 5  . Years of education: N/A   Social  History Main Topics  . Smoking status: Former Research scientist (life sciences)  . Smokeless tobacco: Former Systems developer    Types: Chew  . Alcohol use No  . Drug use: No  . Sexual activity: Not Currently   Other Topics Concern  . None   Social History Narrative  . None    Outpatient Encounter Prescriptions as of 03/17/2016  Medication Sig  . amLODipine (NORVASC) 2.5 MG tablet Take 1 tablet (2.5 mg total) by mouth daily.  Marland Kitchen escitalopram (LEXAPRO) 10 MG tablet TAKE ONE TABLET BY MOUTH EVERY DAY  . omeprazole (PRILOSEC) 20 MG capsule Take 1 capsule (20 mg total) by mouth daily.  . timolol (BETIMOL) 0.25 % ophthalmic solution 1-2 drops 2 (two) times daily.   No facility-administered encounter medications on file as of 03/17/2016.     Review of Systems  Constitutional: Positive for appetite change.       Weight loss.   HENT: Negative for congestion and sinus pressure.   Respiratory: Positive for shortness of breath. Negative for cough and chest tightness.   Cardiovascular: Negative for chest pain, palpitations and leg swelling.  Gastrointestinal: Positive for constipation. Negative for abdominal pain, blood in stool, nausea and vomiting.  Genitourinary: Negative for difficulty urinating  and dysuria.  Musculoskeletal: Negative for back pain and joint swelling.  Skin: Negative for color change and rash.  Neurological: Negative for dizziness, light-headedness and headaches.  Psychiatric/Behavioral: Negative for agitation and dysphoric mood.       Some anxiety.  Feels needs low dose lexapro.         Objective:     Blood pressure rechecked by me:  140/62  Physical Exam  Constitutional: She appears well-developed and well-nourished. No distress.  HENT:  Nose: Nose normal.  Mouth/Throat: Oropharynx is clear and moist.  Neck: Neck supple. No thyromegaly present.  Cardiovascular: Normal rate and regular rhythm.   Pulmonary/Chest: Breath sounds normal. No respiratory distress. She has no wheezes.  Abdominal:  Soft. Bowel sounds are normal. There is no tenderness.  Musculoskeletal: She exhibits no edema or tenderness.  Lymphadenopathy:    She has no cervical adenopathy.  Skin: No rash noted. No erythema.  Psychiatric: She has a normal mood and affect. Her behavior is normal.    BP (!) 160/70 (BP Location: Left Arm, Patient Position: Sitting, Cuff Size: Normal)   Pulse (!) 113   Temp 97.8 F (36.6 C) (Oral)   Resp 18   Wt 117 lb 6 oz (53.2 kg)   SpO2 91%   BMI 20.15 kg/m  Wt Readings from Last 3 Encounters:  03/17/16 117 lb 6 oz (53.2 kg)  03/01/16 121 lb 9.6 oz (55.2 kg)  02/04/16 122 lb (55.3 kg)     Lab Results  Component Value Date   WBC 4.0 03/01/2016   HGB 13.8 03/01/2016   HCT 42.7 03/01/2016   PLT 207.0 03/01/2016   GLUCOSE 83 03/01/2016   CHOL 254 (H) 05/28/2015   TRIG 100.0 05/28/2015   HDL 58.40 05/28/2015   LDLDIRECT 163.5 01/12/2013   LDLCALC 176 (H) 05/28/2015   ALT 6 03/01/2016   AST 12 03/01/2016   NA 142 03/01/2016   K 3.6 03/01/2016   CL 99 03/01/2016   CREATININE 0.77 03/01/2016   BUN 9 03/01/2016   CO2 35 (H) 03/01/2016   TSH 3.20 03/01/2016   INR 1.38 06/01/2015   HGBA1C 5.7 10/04/2013    Nm Gi Blood Loss  Result Date: 05/31/2015 CLINICAL DATA:  Patient with multiple episodes of blood within the stool. Week and fatigue. Low hemoglobin. EXAM: NUCLEAR MEDICINE GASTROINTESTINAL BLEEDING SCAN TECHNIQUE: Sequential abdominal images were obtained following intravenous administration of Tc-44m labeled red blood cells. RADIOPHARMACEUTICALS:  21.7 mCi Tc-52m in-vitro labeled red cells. COMPARISON:  None. FINDINGS: Radiotracer is demonstrated within the expected location of the cecum and ascending colon progressing distally into the transverse and proximal descending colon. Tracer is demonstrated within the vasculature as well as within the bladder. IMPRESSION: Positive bleeding examination, with origin likely within the region of the cecum/proximal ascending  colon. Critical Value/emergent results were called by telephone at the time of interpretation on 05/31/2015 at 7:08 pm to Dr. Abel Presto , who verbally acknowledged these results. Electronically Signed   By: Lovey Newcomer M.D.   On: 05/31/2015 19:09   Ct Abdomen Pelvis W Contrast  Result Date: 06/01/2015 CLINICAL DATA:  Colonic GI bleed EXAM: CT ABDOMEN AND PELVIS WITH CONTRAST TECHNIQUE: Multidetector CT imaging of the abdomen and pelvis was performed using the standard protocol following bolus administration of intravenous contrast. CONTRAST:  83mL OMNIPAQUE IOHEXOL 300 MG/ML  SOLN COMPARISON:  05/31/2015 FINDINGS: Lung bases demonstrate bibasilar atelectasis without sizable effusions. Gallbladder is been surgically removed. The liver demonstrates a 2.2 cmhepatic cyst  in the left lobe stable from the prior exam. The spleen, adrenal glands and pancreas are within normal limits with the exception of some mild calcifications within the pancreas which are stable in appearance from the prior exam. The kidneys demonstrate a normal enhancement pattern. Prominent extrarenal pelves are noted bilaterally. These findings are stable from the prior exam. No obstructive changes are seen. Bladder is well distended. The colon demonstrates diffuse diverticular change without evidence of diverticulitis. The appendix is filled with barium. The ascending colon demonstrates diverticular change which may be the etiology of the patient's underlying hemorrhage. Additionally a filling defect is noted within the barium in the cecum in the possibility of an underlying polypoid lesion cannot be totally excluded. Diffuse aortoiliac calcifications are noted without aneurysmal dilatation. Calcified uterine fibroids are seen. The osseous structures show no acute abnormality. IMPRESSION: Filling defect within the cecum. This may be in part related incomplete distension although the possibility of a polypoid lesion would deserve consideration  given the recent hemorrhage. Diverticulosis without diverticulitis. Diverticular change may be the etiology of the patient's right colon hemorrhage. Chronic changes as described above. Electronically Signed   By: Inez Catalina M.D.   On: 06/01/2015 07:30   Ir Angiogram Visceral Selective  Result Date: 06/01/2015 INDICATION: Acute lower GI bleeding originating in the cecum. EXAM: SELECTIVE VISCERAL ARTERIOGRAPHY; IR ULTRASOUND GUIDANCE VASC ACCESS RIGHT MEDICATIONS: 1% lidocaine locally. ANESTHESIA/SEDATION: Fentanyl 25 mcg IV; Versed 1.5 mg IV Moderate Sedation Time:  30 minutes The patient was continuously monitored during the procedure by the interventional radiology nurse under my direct supervision. CONTRAST:  74mL OMNIPAQUE IOHEXOL 300 MG/ML  SOLN FLUOROSCOPY TIME:  Fluoroscopy Time: 7 minutes 48 seconds (241 mGy). COMPLICATIONS: None immediate. PROCEDURE: Informed consent was obtained from the patient following explanation of the procedure, risks, benefits and alternatives. The patient understands, agrees and consents for the procedure. All questions were addressed. A time out was performed prior to the initiation of the procedure. Maximal barrier sterile technique utilized including caps, mask, sterile gowns, sterile gloves, large sterile drape, hand hygiene, and Betadine prep. Under sterile conditions and local anesthesia, right common femoral artery ultrasound micropuncture access performed. Images obtained for documentation. Five French sheath inserted. Initially a C2 catheter was advanced into the aorta. Attempts were made to cannulate the SMA with a C2 catheter however this was unsuccessful. Catheter was exchanged for a Sos Omni select catheter. This catheter was reformed in the aorta and utilized to select the SMA. SMA angiograms performed in the frontal and oblique projections. SMA: Atherosclerotic changes of the SMA origin and trunk. SMA trunk is widely patent. Jejunal and colic branches are  patent. 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 No evidence of active contrast extravasation or active bleeding in the right colon. On the venous phase, the superior mesenteric vein and portal vein are both patent. Catheter access removed. Hemostasis obtained with manual compression. No immediate complication. Patient tolerated the procedure well. IMPRESSION: Negative for active mesenteric bleeding in the right colon. Electronically Signed   By: Jerilynn Mages.  Shick M.D.   On: 06/01/2015 12:28   Ir US Guide Vasc Access Right  Result Date: 06/01/2015 INDICATION: Acute lower GI bleeding originating in the cecum. EXAM: SELECTIVE VISCERAL ARTERIOGRAPHY; IR ULTRASOUND GUIDANCE VASC ACCESS RIGHT MEDICATIONS: 1% lidocaine locally. ANESTHESIA/SEDATION: Fentanyl 25 mcg IV; Versed 1.5 mg IV Moderate Sedation Time:  30 minutes The patient was continuously monitored during the procedure by the interventional radiology nurse under my  direct supervision. CONTRAST:  74mL OMNIPAQUE IOHEXOL 300 MG/ML  SOLN FLUOROSCOPY TIME:  Fluoroscopy Time: 7 minutes 48 seconds (241 mGy). COMPLICATIONS: None immediate. PROCEDURE: Informed consent was obtained from the patient following explanation of the procedure, risks, benefits and alternatives. The patient understands, agrees and consents for the procedure. All questions were addressed. A time out was performed prior to the initiation of the procedure. Maximal barrier sterile technique utilized including caps, mask, sterile gowns, sterile gloves, large sterile drape, hand hygiene, and Betadine prep. Under sterile conditions and local anesthesia, right common femoral artery ultrasound micropuncture access performed. Images obtained for documentation. Five French sheath inserted. Initially a C2 catheter was advanced into the aorta. Attempts were made to cannulate the SMA with a C2 catheter however this was unsuccessful. Catheter was exchanged  for a Sos Omni select catheter. This catheter was reformed in the aorta and utilized to select the SMA. SMA angiograms performed in the frontal and oblique projections. SMA: Atherosclerotic changes of the SMA origin and trunk. SMA trunk is widely patent. Jejunal and colic branches are patent. 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 No evidence of active contrast extravasation or active bleeding in the right colon. On the venous phase, the superior mesenteric vein and portal vein are both patent. Catheter access removed. Hemostasis obtained with manual compression. No immediate complication. Patient tolerated the procedure well. IMPRESSION: Negative for active mesenteric bleeding in the right colon. Electronically Signed   By: Jerilynn Mages.  Shick M.D.   On: 06/01/2015 12:28   Dg Chest Port 1 View  Result Date: 06/01/2015 CLINICAL DATA:  Gastrointestinal bleeding EXAM: PORTABLE CHEST 1 VIEW COMPARISON:  10/22/2013 FINDINGS: Heart is upper limits normal in size. Increasing bibasilar airspace opacities, likely atelectasis. Possible small left effusion. No acute bony abnormality. IMPRESSION: Increasing bibasilar opacities, likely atelectasis. Question small left effusion. Electronically Signed   By: Rolm Baptise M.D.   On: 06/01/2015 07:07       Assessment & Plan:   Problem List Items Addressed This Visit    Anemia    Recent hgb stable.  Follow.       Anxiety    On lexapro.  Decreased dose some last visit.  Appears to be stable.  Follow.       Essential hypertension, benign    Blood pressure on recheck better.  Follow.        GERD (gastroesophageal reflux disease)    Have been controlled on omeprazole.  Was having some dysphagia.  Appears to be better now.  Follow.        Loss of weight    Weight down from the previous check.  Discussed further w/up.  She declines.  Follow.  Encourage increased po intake.        Near syncope    Has the  spells as outlined.  Saw cardiology.  Had holter monitor.  No significant arrhythmia.  Felt no further w/up warranted.  Encourage increased po intake.  Stay hydrated.        Neck nodule    Saw ENT.  Felt to have a brachial plexus lesion.  She declined neurosurgery referral.        Rectal bleeding    Has had no further bleeding.  W/up as outlined.  Felt not to be a good candidate for colonoscopy.  Follow.  Recent cbc ok.        SOB (shortness of breath) - Primary    Describes the sob with  exertion.  Discussed further w/up especially given recent weight loss.  Discussed repeat CT scan to assess for any change or progression when compared to previous scan.  She declines.  Did agree to cxr.  Had cardiology w/up as outlined.        Relevant Orders   DG Chest 2 View (Completed)       [name], MD

## 2016-03-28 ENCOUNTER — Encounter: Payer: Self-pay | Admitting: Internal Medicine

## 2016-03-28 DIAGNOSIS — R0602 Shortness of breath: Secondary | ICD-10-CM | POA: Insufficient documentation

## 2016-03-28 NOTE — Assessment & Plan Note (Signed)
Saw ENT.  Felt to have a brachial plexus lesion.  She declined neurosurgery referral.

## 2016-03-28 NOTE — Assessment & Plan Note (Signed)
Weight down from the previous check.  Discussed further w/up.  She declines.  Follow.  Encourage increased po intake.

## 2016-03-28 NOTE — Assessment & Plan Note (Signed)
Blood pressure on recheck better.  Follow.   

## 2016-03-28 NOTE — Assessment & Plan Note (Signed)
Recent hgb stable.  Follow.

## 2016-03-28 NOTE — Assessment & Plan Note (Signed)
Has had no further bleeding.  W/up as outlined.  Felt not to be a good candidate for colonoscopy.  Follow.  Recent cbc ok.

## 2016-03-28 NOTE — Assessment & Plan Note (Signed)
Describes the sob with exertion.  Discussed further w/up especially given recent weight loss.  Discussed repeat CT scan to assess for any change or progression when compared to previous scan.  She declines.  Did agree to cxr.  Had cardiology w/up as outlined.

## 2016-03-28 NOTE — Assessment & Plan Note (Signed)
On lexapro.  Decreased dose some last visit.  Appears to be stable.  Follow.

## 2016-03-28 NOTE — Assessment & Plan Note (Signed)
Has the spells as outlined.  Saw cardiology.  Had holter monitor.  No significant arrhythmia.  Felt no further w/up warranted.  Encourage increased po intake.  Stay hydrated.

## 2016-03-28 NOTE — Assessment & Plan Note (Signed)
Have been controlled on omeprazole.  Was having some dysphagia.  Appears to be better now.  Follow.

## 2016-04-15 DIAGNOSIS — G540 Brachial plexus disorders: Secondary | ICD-10-CM | POA: Diagnosis not present

## 2016-04-28 ENCOUNTER — Other Ambulatory Visit: Payer: Self-pay | Admitting: Internal Medicine

## 2016-05-10 DIAGNOSIS — D3611 Benign neoplasm of peripheral nerves and autonomic nervous system of face, head, and neck: Secondary | ICD-10-CM | POA: Diagnosis not present

## 2016-07-14 ENCOUNTER — Telehealth: Payer: Self-pay | Admitting: *Deleted

## 2016-07-14 ENCOUNTER — Emergency Department
Admission: EM | Admit: 2016-07-14 | Discharge: 2016-07-14 | Disposition: A | Discharge status: Home / Self Care | Payer: Medicare Other | Attending: Emergency Medicine | Admitting: Emergency Medicine

## 2016-07-14 ENCOUNTER — Emergency Department: Payer: Medicare Other

## 2016-07-14 ENCOUNTER — Telehealth: Payer: Self-pay | Admitting: Internal Medicine

## 2016-07-14 ENCOUNTER — Encounter: Payer: Self-pay | Admitting: *Deleted

## 2016-07-14 DIAGNOSIS — R531 Weakness: Secondary | ICD-10-CM | POA: Diagnosis not present

## 2016-07-14 DIAGNOSIS — Z79899 Other long term (current) drug therapy: Secondary | ICD-10-CM | POA: Diagnosis not present

## 2016-07-14 DIAGNOSIS — I10 Essential (primary) hypertension: Secondary | ICD-10-CM | POA: Insufficient documentation

## 2016-07-14 DIAGNOSIS — Z87891 Personal history of nicotine dependence: Secondary | ICD-10-CM | POA: Insufficient documentation

## 2016-07-14 DIAGNOSIS — R0602 Shortness of breath: Secondary | ICD-10-CM | POA: Insufficient documentation

## 2016-07-14 LAB — CBC
HCT: 44.5 % (ref 35.0–47.0)
HEMOGLOBIN: 14.7 g/dL (ref 12.0–16.0)
MCH: 29.2 pg (ref 26.0–34.0)
MCHC: 33 g/dL (ref 32.0–36.0)
MCV: 88.6 fL (ref 80.0–100.0)
PLATELETS: 221 10*3/uL (ref 150–440)
RBC: 5.02 MIL/uL (ref 3.80–5.20)
RDW: 14.6 % — ABNORMAL HIGH (ref 11.5–14.5)
WBC: 4.7 10*3/uL (ref 3.6–11.0)

## 2016-07-14 LAB — URINALYSIS, COMPLETE (UACMP) WITH MICROSCOPIC
Bacteria, UA: NONE SEEN
Bilirubin Urine: NEGATIVE
GLUCOSE, UA: NEGATIVE mg/dL
HGB URINE DIPSTICK: NEGATIVE
Ketones, ur: NEGATIVE mg/dL
Leukocytes, UA: NEGATIVE
NITRITE: NEGATIVE
PH: 6 (ref 5.0–8.0)
Protein, ur: NEGATIVE mg/dL
RBC / HPF: NONE SEEN RBC/hpf (ref 0–5)
Specific Gravity, Urine: 1.019 (ref 1.005–1.030)

## 2016-07-14 LAB — BASIC METABOLIC PANEL
ANION GAP: 7 (ref 5–15)
BUN: 18 mg/dL (ref 6–20)
CALCIUM: 9.7 mg/dL (ref 8.9–10.3)
CO2: 31 mmol/L (ref 22–32)
CREATININE: 0.73 mg/dL (ref 0.44–1.00)
Chloride: 99 mmol/L — ABNORMAL LOW (ref 101–111)
Glucose, Bld: 97 mg/dL (ref 65–99)
Potassium: 4.6 mmol/L (ref 3.5–5.1)
SODIUM: 137 mmol/L (ref 135–145)

## 2016-07-14 LAB — TROPONIN I: Troponin I: <0.03 ng/mL (ref <0.03)

## 2016-07-14 NOTE — ED Triage Notes (Signed)
PT reports having increased weakness and SOB for the past month. Pt reports having a productive cough with white sputum. No fevers reported at home but pt reports feeling diaphoretic at times. Pt denies increased falls at home but reports increased fatigue upon exertion. Decrease appetite and decreased PO intake. Family reports pt has been drinking Ensure. Pt able to speak in full sentences in triage without SOB. Pt alert and oriented x 4.

## 2016-07-14 NOTE — Telephone Encounter (Signed)
Ariel from [facility] called pt is denying ED right now, may be going later on.

## 2016-07-14 NOTE — Telephone Encounter (Signed)
See other message.  Agree with evaluation today (per triage).

## 2016-07-14 NOTE — Telephone Encounter (Signed)
Reason for call: SOB, loss of appetite   Symptoms: SOB X month, patient is able to walk around, stopped BP med 2-3 weeks ago  Spoke with [name] daughter  they're going to [facility] to get evaluated , son is going to take them PMH, CVA, Hypertension,  Still waiting for note from [place]

## 2016-07-14 NOTE — Telephone Encounter (Signed)
FYI Family Member will take patient to the emergency department

## 2016-07-14 NOTE — Telephone Encounter (Signed)
If increased sob, etc - agree with evaluation today.

## 2016-07-14 NOTE — Telephone Encounter (Signed)
[place] Patient Name: Taylor Watson DOB: May 23, 1918 Initial Comment Caller states friend has shortness of breathe, loss of appetite, no other symptoms reported. Nurse Assessment Nurse: Andria Frames, RN, Aeriel Date/Time Eilene Ghazi Time): 07/14/2016 2:37:49 PM Confirm and document reason for call. If symptomatic, describe symptoms. ---Caller states, pt is having shortness of breathe and loss of appetite. She is having it right now and has been having it for a while. She has episodes of hot spells every once in a while. She has been weak and nervous. Does the patient have any new or worsening symptoms? ---Yes Will a triage be completed? ---Yes Related visit to physician within the last 2 weeks? ---No Does the PT have any chronic conditions? (i.e. diabetes, asthma, etc.) ---Yes List chronic conditions. ---she has hx of htn Is this a behavioral health or substance abuse call? ---No Guidelines Guideline Title Affirmed Question Affirmed Notes Weakness (Generalized) and Fatigue Difficulty breathing Final Disposition User Go to ED Now Hensel, RN, Aeriel Comments Caller states, she may go later this afternoon. Nurse contacting [facility] to inform them of pt refusal. Referrals [facility] - ED Disagree/Comply: Disagree Disagree/Comply Reason: Disagree with instructions

## 2016-07-14 NOTE — Telephone Encounter (Signed)
fyi

## 2016-07-14 NOTE — Telephone Encounter (Signed)
Family Member reported pt having SOB and loss of appetite.  Pt contact [phone] *pt was transferred to [facility] for SOB

## 2016-07-14 NOTE — ED Provider Notes (Signed)
[facility] Emergency Department Provider Note    ____________________________________________   I have reviewed the triage vital signs and the nursing notes.   HISTORY  Chief Complaint Weakness and Shortness of Breath   History limited by: Not Limited   HPI Taylor Watson is a 83 y.o. female who presents to the emergency department today because of concerns for shortness of breath, decreased appetite and weakness. The patient has had these symptoms for months. She was brought in today because they finally convinced her to come in. It does not sound like anything is changed today. Patient denies any chest pain. No fevers. Occasional cough.    Past Medical History:  Diagnosis Date  . Allergy   . Anemia   . CVA (cerebral vascular accident) ([place])   . Depression   . GERD (gastroesophageal reflux disease)   . History of blood transfusion   . History of kidney stones   . Hx: UTI (urinary tract infection)   . Hypercholesterolemia   . Hypertension     Patient Active Problem List   Diagnosis Date Noted  . SOB (shortness of breath) 03/28/2016  . Near syncope 03/01/2016  . Anemia 10/28/2015  . Lower GI bleed   . Encounter for palliative care   . Goals of care, counseling/discussion   . Sepsis ([place]) 06/01/2015  . Blood loss anemia 06/01/2015  . Fever, unspecified 06/01/2015  . GERD (gastroesophageal reflux disease)   . Depression   . Unsteady gait 02/08/2015  . Eye discomfort 12/07/2014  . Neck nodule 10/07/2013  . Rectal bleeding 04/03/2013  . Light headedness 01/19/2013  . Loss of weight 10/22/2012  . Hypercholesterolemia 10/22/2012  . CVA (cerebral vascular accident) ([place]) 10/22/2012  . Anxiety 10/22/2012  . Essential hypertension, benign 10/22/2012    Past Surgical History:  Procedure Laterality Date  . CHOLECYSTECTOMY  1984  . NECK LESION BIOPSY  2015   Followed by Dr. Richardson Landry    Prior to Admission medications   Medication Sig  Start Date End Date Taking? Authorizing Provider  amLODipine (NORVASC) 2.5 MG tablet Take 1 tablet (2.5 mg total) by mouth daily. 03/01/16   [name], MD  escitalopram (LEXAPRO) 10 MG tablet TAKE ONE TABLET BY MOUTH EVERY DAY 04/28/16   [name], MD  omeprazole (PRILOSEC) 20 MG capsule Take 1 capsule (20 mg total) by mouth daily. 03/01/16   [name], MD  timolol (BETIMOL) 0.25 % ophthalmic solution 1-2 drops 2 (two) times daily.    Historical Provider, MD    Allergies Dyazide [hydrochlorothiazide w-triamterene]; Monopril [fosinopril]; Toprol xl [metoprolol tartrate]; and Verapamil  Family History  Problem Relation Age of Onset  . Cancer Mother     unknown type  . Hypertension Mother   . Cancer Father     unknown type  . Breast cancer Daughter   . Colon cancer      grandfather    Social History Social History  Substance Use Topics  . Smoking status: Former Research scientist (life sciences)  . Smokeless tobacco: Former Systems developer    Types: Chew  . Alcohol use No    Review of Systems  Constitutional: Negative for fever. Cardiovascular: Negative for chest pain. Respiratory: Positive for shortness of breath. Gastrointestinal: Negative for abdominal pain, vomiting and diarrhea. Neurological: Negative for headaches, focal weakness or numbness.  10-point ROS otherwise negative.  ____________________________________________   PHYSICAL EXAM:  VITAL SIGNS: ED Triage Vitals  Enc Vitals Group     BP 07/14/16 1551 (!) 153/61  Pulse Rate 07/14/16 1551 85     Resp 07/14/16 1551 16     Temp 07/14/16 1551 97.9 F (36.6 C)     Temp Source 07/14/16 1551 Oral     SpO2 07/14/16 1551 93 %     Weight 07/14/16 1549 115 lb (52.2 kg)     Height 07/14/16 1549 5\' 5"  (1.651 m)     Head Circumference --      Peak Flow --      Pain Score --      Pain Loc --      Pain Edu? --      Excl. in [place]? --      Constitutional: Alert and oriented. Well appearing and in no distress. Eyes: Conjunctivae are  normal. Normal extraocular movements. ENT   Head: Normocephalic and atraumatic.   Nose: No congestion/rhinnorhea.   Mouth/Throat: Mucous membranes are moist.   Neck: No stridor. Hematological/Lymphatic/Immunilogical: No cervical lymphadenopathy. Cardiovascular: Normal rate, regular rhythm.  No murmurs, rubs, or gallops. Respiratory: Normal respiratory effort without tachypnea nor retractions. Breath sounds are clear and equal bilaterally. No wheezes/rales/rhonchi. Gastrointestinal: Soft and non tender. No rebound. No guarding.  Genitourinary: Deferred Musculoskeletal: Normal range of motion in all extremities. No lower extremity edema. Neurologic:  Normal speech and language. No gross focal neurologic deficits are appreciated.  Skin:  Skin is warm, dry and intact. No rash noted. Psychiatric: Mood and affect are normal. Speech and behavior are normal. Patient exhibits appropriate insight and judgment.  ____________________________________________    LABS (pertinent positives/negatives)  Labs Reviewed  BASIC METABOLIC PANEL - Abnormal; Notable for the following:       Result Value   Chloride 99 (*)    All other components within normal limits  CBC - Abnormal; Notable for the following:    RDW 14.6 (*)    All other components within normal limits  URINALYSIS, COMPLETE (UACMP) WITH MICROSCOPIC - Abnormal; Notable for the following:    Color, Urine YELLOW (*)    APPearance CLEAR (*)    Squamous Epithelial / LPF 0-5 (*)    All other components within normal limits  TROPONIN I  CBG MONITORING, ED     ____________________________________________   EKG  I, Nance Pear, attending physician, personally viewed and interpreted this EKG  EKG Time: 1605 Rate: 80 Rhythm: normal sinus rhythm Axis: left axis deviation Intervals: qtc 431 QRS: narrow, q waves V1, V2, V3 ST changes: no st elevation Impression: abnormal  ekg   ____________________________________________    RADIOLOGY  CXR IMPRESSION:  No active cardiopulmonary disease. Aortic atherosclerosis. Chronic  blunting of costal diaphragmatic angles.   ____________________________________________   PROCEDURES  Procedures  ____________________________________________   INITIAL IMPRESSION / ASSESSMENT AND PLAN / ED COURSE  Pertinent labs & imaging results that were available during my care of the patient were reviewed by me and considered in my medical decision making (see chart for details).  Patient presented to the emergency department today with a couple month history of progressive weakness, decreased appetite and shortness breath. Workup here without any concerning abnormalities of the electrolytes. No signs of infection. Chest x-ray without any concerning findings. This point think patient is safe for discharge. Will have patient follow-up with primary care.  ____________________________________________   FINAL CLINICAL IMPRESSION(S) / ED DIAGNOSES  Final diagnoses:  Weakness     Note: This dictation was prepared with Dragon dictation. Any transcriptional errors that result from this process are unintentional     Nance Pear, MD  07/14/16 2017  

## 2016-07-14 NOTE — Discharge Instructions (Signed)
Please seek medical attention for any high fevers, chest pain, shortness of breath, change in behavior, persistent vomiting, bloody stool or any other new or concerning symptoms.  

## 2016-07-15 ENCOUNTER — Telehealth: Payer: Self-pay | Admitting: Internal Medicine

## 2016-07-15 NOTE — Telephone Encounter (Signed)
Pt son called and stated that pt was in the ed for SOB. While n the hospital they found out that pt has not been taking her medication. They would liek an appointment to have her checked. Please advise, thank you!  Call Michael @ [phone]

## 2016-07-15 NOTE — Telephone Encounter (Signed)
Spoke to patients son app has been made for tomorrow.

## 2016-07-16 ENCOUNTER — Ambulatory Visit (INDEPENDENT_AMBULATORY_CARE_PROVIDER_SITE_OTHER): Payer: Medicare Other | Admitting: Internal Medicine

## 2016-07-16 ENCOUNTER — Encounter: Payer: Self-pay | Admitting: Internal Medicine

## 2016-07-16 DIAGNOSIS — F329 Major depressive disorder, single episode, unspecified: Secondary | ICD-10-CM | POA: Diagnosis not present

## 2016-07-16 DIAGNOSIS — I10 Essential (primary) hypertension: Secondary | ICD-10-CM

## 2016-07-16 DIAGNOSIS — R634 Abnormal weight loss: Secondary | ICD-10-CM | POA: Diagnosis not present

## 2016-07-16 DIAGNOSIS — R221 Localized swelling, mass and lump, neck: Secondary | ICD-10-CM

## 2016-07-16 DIAGNOSIS — F419 Anxiety disorder, unspecified: Secondary | ICD-10-CM

## 2016-07-16 DIAGNOSIS — F32A Depression, unspecified: Secondary | ICD-10-CM

## 2016-07-16 DIAGNOSIS — R0602 Shortness of breath: Secondary | ICD-10-CM

## 2016-07-16 MED ORDER — OMEPRAZOLE 20 MG PO CPDR
20.0000 mg | DELAYED_RELEASE_CAPSULE | Freq: Every day | ORAL | 5 refills | Status: DC
Start: 2016-07-16 — End: 2017-04-09

## 2016-07-16 MED ORDER — ESCITALOPRAM OXALATE 10 MG PO TABS: ORAL_TABLET | ORAL | 1 refills | Status: DC

## 2016-07-16 NOTE — Progress Notes (Signed)
Pre-visit discussion using our clinic review tool. No additional management support is needed unless otherwise documented below in the visit note.  

## 2016-07-16 NOTE — Progress Notes (Signed)
Patient ID: Taylor Watson, female   DOB: 1919/01/09, 83 y.o.   MRN: [id number]   Subjective:    Patient ID: Taylor Watson, female    DOB: November 06, 1918, 83 y.o.   MRN: [id number]  HPI  Patient here for an ER follow up.  She was seen 07/14/16 with concerns regarding weakness, sob and decreased appetite.  cxr no acute cardiopulmonary disease.  Labs unrevealing.  She is accompanied by her daughter.  History obtained from both of them.  Reports she has stopped taking her medications.  Has been off for two months.  On discussion with her today, states she worries.  She is worried about the neck mass.  Also has stopped her lexapro.  States she stopped because she was afraid would get addicted to the medication.  Does feel it helped her.  No chest pain.  She does feel her breathing is stable overall.  States she feels food is getting hung up.  Some difficulty swallowing.  Has lost weight.  No nausea or vomiting.  No abdominal pain.  Bowels stable.  No bleeding.     Past Medical History:  Diagnosis Date  . Allergy   . Anemia   . CVA (cerebral vascular accident) ([place])   . Depression   . GERD (gastroesophageal reflux disease)   . History of blood transfusion   . History of kidney stones   . Hx: UTI (urinary tract infection)   . Hypercholesterolemia   . Hypertension    Past Surgical History:  Procedure Laterality Date  . CHOLECYSTECTOMY  1984  . NECK LESION BIOPSY  2015   Followed by Dr. Richardson Landry   Family History  Problem Relation Age of Onset  . Cancer Mother     unknown type  . Hypertension Mother   . Cancer Father     unknown type  . Breast cancer Daughter   . Colon cancer      grandfather   Social History   Social History  . Marital status: Widowed    Spouse name: N/A  . Number of children: 5  . Years of education: N/A   Social History Main Topics  . Smoking status: Former Research scientist (life sciences)  . Smokeless tobacco: Former Systems developer    Types: Chew  . Alcohol use No  . Drug use: No  .  Sexual activity: Not Currently   Other Topics Concern  . None   Social History Narrative  . None    Outpatient Encounter Prescriptions as of 07/16/2016  Medication Sig  . escitalopram (LEXAPRO) 10 MG tablet Take 1/2 tablet q day  . ferrous sulfate 325 (65 FE) MG tablet Take 325 mg by mouth daily.  Marland Kitchen omeprazole (PRILOSEC) 20 MG capsule Take 1 capsule (20 mg total) by mouth daily.  . timolol (BETIMOL) 0.25 % ophthalmic solution 1-2 drops 2 (two) times daily.  . [DISCONTINUED] amLODipine (NORVASC) 2.5 MG tablet Take 1 tablet (2.5 mg total) by mouth daily. (Patient not taking: Reported on 07/16/2016)  . [DISCONTINUED] escitalopram (LEXAPRO) 10 MG tablet TAKE ONE TABLET BY MOUTH EVERY DAY (Patient not taking: Reported on 07/16/2016)  . [DISCONTINUED] omeprazole (PRILOSEC) 20 MG capsule Take 1 capsule (20 mg total) by mouth daily. (Patient not taking: Reported on 07/16/2016)   No facility-administered encounter medications on file as of 07/16/2016.     Review of Systems  Constitutional: Positive for appetite change.       Weight loss.    HENT: Negative for congestion and sinus pressure.  Respiratory: Negative for cough and chest tightness.        Feels breathing stable.    Cardiovascular: Negative for chest pain, palpitations and leg swelling.  Gastrointestinal: Negative for abdominal pain, nausea and vomiting.  Genitourinary: Negative for difficulty urinating and dysuria.  Musculoskeletal: Negative for back pain and joint swelling.  Skin: Negative for color change and rash.  Neurological: Negative for dizziness, light-headedness and headaches.  Psychiatric/Behavioral: Negative for agitation.       Objective:     Blood pressure rechecked by me;  130/78  Physical Exam  Constitutional: No distress.  HENT:  Nose: Nose normal.  Mouth/Throat: Oropharynx is clear and moist.  Neck: Neck supple. No thyromegaly present.  Palpable mass - schwannoma.  Non tender - right neck    Cardiovascular: Normal rate and regular rhythm.   Pulmonary/Chest: Breath sounds normal. No respiratory distress. She has no wheezes.  Abdominal: Soft. Bowel sounds are normal. There is no tenderness.  Musculoskeletal: She exhibits no edema or tenderness.  Lymphadenopathy:    She has no cervical adenopathy.  Skin: No rash noted. No erythema.  Psychiatric: She has a normal mood and affect. Her behavior is normal.    BP (!) 102/58 (BP Location: Left Arm, Patient Position: Sitting, Cuff Size: Normal)   Pulse 75   Temp 98.6 F (37 C) (Oral)   Resp 16   Wt 107 lb 12.8 oz (48.9 kg)   SpO2 95%   BMI 17.94 kg/m  Wt Readings from Last 3 Encounters:  07/16/16 107 lb 12.8 oz (48.9 kg)  07/14/16 115 lb (52.2 kg)  03/17/16 117 lb 6 oz (53.2 kg)     Lab Results  Component Value Date   WBC 4.7 07/14/2016   HGB 14.7 07/14/2016   HCT 44.5 07/14/2016   PLT 221 07/14/2016   GLUCOSE 97 07/14/2016   CHOL 254 (H) 05/28/2015   TRIG 100.0 05/28/2015   HDL 58.40 05/28/2015   LDLDIRECT 163.5 01/12/2013   LDLCALC 176 (H) 05/28/2015   ALT 6 03/01/2016   AST 12 03/01/2016   NA 137 07/14/2016   K 4.6 07/14/2016   CL 99 (L) 07/14/2016   CREATININE 0.73 07/14/2016   BUN 18 07/14/2016   CO2 31 07/14/2016   TSH 3.20 03/01/2016   INR 1.38 06/01/2015   HGBA1C 5.7 10/04/2013    Dg Chest 2 View  Result Date: 07/14/2016 CLINICAL DATA:  83 y/o  F; weakness and shortness of breath. EXAM: CHEST  2 VIEW COMPARISON:  03/17/2016 chest radiograph. FINDINGS: Stable normal cardiac silhouette. Tortuous aorta with calcific atherosclerosis. No consolidation or pneumothorax. Chronic blunting of the costal diaphragmatic angles. Bilateral posterior Bochdalek hernias. Stable soft tissue enlargement of the right supraclavicular region. Moderate degenerative changes of thoracic spine. IMPRESSION: No active cardiopulmonary disease. Aortic atherosclerosis. Chronic blunting of costal diaphragmatic angles. Electronically  Signed   By: Kristine Garbe M.D.   On: 07/14/2016 18:47       Assessment & Plan:   Problem List Items Addressed This Visit    Anxiety    Restart lexapro.  Follow.       Relevant Medications   escitalopram (LEXAPRO) 10 MG tablet   Depression    Restart lexapro.  Tolerated well.  Follow.        Relevant Medications   escitalopram (LEXAPRO) 10 MG tablet   Essential hypertension, benign    Blood pressure doing well off medication.  Remain off.  Follow.        Loss  of weight    Persistent.  She desires no further testing or exam.  She is having trouble swallowing.  Discussed swallowing evaluation (barium swallow with UGI).  She declines.  Encourage increased po intake.  Nutritional supplements.  Follow.        Neck nodule    Saw ENT.  Felt to be c/w schwannoma.  Discussed with her today.  Questions answered.  Follow.        SOB (shortness of breath)    Feels breathing is stable.  Saw cardiology prior to last appt.  Stable.            [name], MD

## 2016-07-18 ENCOUNTER — Encounter: Payer: Self-pay | Admitting: Internal Medicine

## 2016-07-18 NOTE — Assessment & Plan Note (Signed)
Persistent.  She desires no further testing or exam.  She is having trouble swallowing.  Discussed swallowing evaluation (barium swallow with UGI).  She declines.  Encourage increased po intake.  Nutritional supplements.  Follow.

## 2016-07-18 NOTE — Assessment & Plan Note (Signed)
Restart lexapro.  Follow.

## 2016-07-18 NOTE — Assessment & Plan Note (Signed)
Feels breathing is stable.  Saw cardiology prior to last appt.  Stable.

## 2016-07-18 NOTE — Assessment & Plan Note (Signed)
Blood pressure doing well off medication.  Remain off.  Follow.

## 2016-07-18 NOTE — Assessment & Plan Note (Signed)
Saw ENT.  Felt to be c/w schwannoma.  Discussed with her today.  Questions answered.  Follow.

## 2016-07-18 NOTE — Assessment & Plan Note (Signed)
Restart lexapro.  Tolerated well.  Follow.

## 2016-09-16 ENCOUNTER — Ambulatory Visit: Payer: Medicare Other | Admitting: Internal Medicine

## 2016-10-26 ENCOUNTER — Other Ambulatory Visit: Payer: Self-pay | Admitting: Internal Medicine

## 2016-10-27 ENCOUNTER — Encounter: Payer: Self-pay | Admitting: Internal Medicine

## 2016-10-27 ENCOUNTER — Ambulatory Visit (INDEPENDENT_AMBULATORY_CARE_PROVIDER_SITE_OTHER): Payer: Medicare Other | Admitting: Internal Medicine

## 2016-10-27 DIAGNOSIS — R0602 Shortness of breath: Secondary | ICD-10-CM

## 2016-10-27 DIAGNOSIS — K219 Gastro-esophageal reflux disease without esophagitis: Secondary | ICD-10-CM | POA: Diagnosis not present

## 2016-10-27 DIAGNOSIS — Z8673 Personal history of transient ischemic attack (TIA), and cerebral infarction without residual deficits: Secondary | ICD-10-CM

## 2016-10-27 DIAGNOSIS — K625 Hemorrhage of anus and rectum: Secondary | ICD-10-CM | POA: Diagnosis not present

## 2016-10-27 DIAGNOSIS — Z515 Encounter for palliative care: Secondary | ICD-10-CM | POA: Diagnosis not present

## 2016-10-27 DIAGNOSIS — E7800 Pure hypercholesterolemia, unspecified: Secondary | ICD-10-CM | POA: Diagnosis not present

## 2016-10-27 DIAGNOSIS — F419 Anxiety disorder, unspecified: Secondary | ICD-10-CM

## 2016-10-27 DIAGNOSIS — D649 Anemia, unspecified: Secondary | ICD-10-CM | POA: Diagnosis not present

## 2016-10-27 DIAGNOSIS — F329 Major depressive disorder, single episode, unspecified: Secondary | ICD-10-CM

## 2016-10-27 DIAGNOSIS — D3610 Benign neoplasm of peripheral nerves and autonomic nervous system, unspecified: Secondary | ICD-10-CM | POA: Diagnosis not present

## 2016-10-27 DIAGNOSIS — I10 Essential (primary) hypertension: Secondary | ICD-10-CM

## 2016-10-27 DIAGNOSIS — F32A Depression, unspecified: Secondary | ICD-10-CM

## 2016-10-27 DIAGNOSIS — R634 Abnormal weight loss: Secondary | ICD-10-CM

## 2016-10-27 LAB — CBC WITH DIFFERENTIAL/PLATELET
BASOS ABS: 0.1 10*3/uL (ref 0.0–0.1)
Basophils Relative: 1.2 % (ref 0.0–3.0)
EOS PCT: 8 % — AB (ref 0.0–5.0)
Eosinophils Absolute: 0.3 10*3/uL (ref 0.0–0.7)
HEMATOCRIT: 42.8 % (ref 36.0–46.0)
Hemoglobin: 14 g/dL (ref 12.0–15.0)
LYMPHS ABS: 1.4 10*3/uL (ref 0.7–4.0)
LYMPHS PCT: 33 % (ref 12.0–46.0)
MCHC: 32.7 g/dL (ref 30.0–36.0)
MCV: 90.9 fl (ref 78.0–100.0)
MONOS PCT: 10.3 % (ref 3.0–12.0)
Monocytes Absolute: 0.4 10*3/uL (ref 0.1–1.0)
NEUTROS ABS: 2.1 10*3/uL (ref 1.4–7.7)
NEUTROS PCT: 47.5 % (ref 43.0–77.0)
PLATELETS: 211 10*3/uL (ref 150.0–400.0)
RBC: 4.71 Mil/uL (ref 3.87–5.11)
RDW: 13.8 % (ref 11.5–15.5)
WBC: 4.3 10*3/uL (ref 4.0–10.5)

## 2016-10-27 LAB — BASIC METABOLIC PANEL
BUN: 12 mg/dL (ref 6–23)
CO2: 34 meq/L — AB (ref 19–32)
Calcium: 10.5 mg/dL (ref 8.4–10.5)
Chloride: 102 mEq/L (ref 96–112)
Creatinine, Ser: 0.76 mg/dL (ref 0.40–1.20)
GFR: 90.78 mL/min (ref >60.00)
GLUCOSE: 100 mg/dL — AB (ref 70–99)
POTASSIUM: 4.5 meq/L (ref 3.5–5.1)
SODIUM: 142 meq/L (ref 135–145)

## 2016-10-27 LAB — FERRITIN: Ferritin: 131.3 ng/mL (ref 10.0–291.0)

## 2016-10-27 LAB — TSH: TSH: 2.31 u[IU]/mL (ref 0.35–4.50)

## 2016-10-27 NOTE — Progress Notes (Signed)
Patient ID: Taylor Watson, female   DOB: [birth year], 83 y.o.   MRN: [id number]   Subjective:    Patient ID: Taylor Watson, female    DOB: 1919/02/22, 83 y.o.   MRN: [id number]  HPI  Patient here for a scheduled follow up.  She is accompanied by her daughter.  History obtained from both of them.  She reports she has been having some increased some increased anxiety.  Never restarted the lexapro.  States she was afraid she would get addicted to the medication.  We discussed this at length today.  Discussed that she has previously been on the medication for years and did well when taking.  She is eating.  Discussed increased po intake.  No chest pain.  Breathing overall stable.  No acid reflux.  No abdominal pain.  Bowels moving.  No bleeding.  She is not on blood pressure medication.  Has been off for a long time.     Past Medical History:  Diagnosis Date  . Allergy   . Anemia   . CVA (cerebral vascular accident) ([place])   . Depression   . GERD (gastroesophageal reflux disease)   . History of blood transfusion   . History of kidney stones   . Hx: UTI (urinary tract infection)   . Hypercholesterolemia   . Hypertension    Past Surgical History:  Procedure Laterality Date  . CHOLECYSTECTOMY  1984  . NECK LESION BIOPSY  2015   Followed by Dr. Richardson Landry   Family History  Problem Relation Age of Onset  . Cancer Mother        unknown type  . Hypertension Mother   . Cancer Father        unknown type  . Breast cancer Daughter   . Colon cancer Unknown        grandfather   Social History   Social History  . Marital status: Widowed    Spouse name: N/A  . Number of children: 5  . Years of education: N/A   Social History Main Topics  . Smoking status: Former Research scientist (life sciences)  . Smokeless tobacco: Former Systems developer    Types: Chew  . Alcohol use No  . Drug use: No  . Sexual activity: Not Currently   Other Topics Concern  . None   Social History Narrative  . None    Outpatient  Encounter Prescriptions as of 10/27/2016  Medication Sig  . escitalopram (LEXAPRO) 10 MG tablet TAKE ONE-HALF TABLET BY MOUTH DAILY  . ferrous sulfate 325 (65 FE) MG tablet Take 325 mg by mouth daily.  Marland Kitchen omeprazole (PRILOSEC) 20 MG capsule Take 1 capsule (20 mg total) by mouth daily.  . timolol (BETIMOL) 0.25 % ophthalmic solution 1-2 drops 2 (two) times daily.   No facility-administered encounter medications on file as of 10/27/2016.     Review of Systems  Constitutional: Negative for appetite change and unexpected weight change.       Has gained several pounds since last check.   HENT: Negative for congestion and sinus pressure.   Respiratory: Negative for cough and chest tightness.        Breathing overall stable.   Cardiovascular: Negative for chest pain, palpitations and leg swelling.  Gastrointestinal: Negative for abdominal pain, diarrhea, nausea and vomiting.  Genitourinary: Negative for difficulty urinating and dysuria.  Musculoskeletal: Negative for back pain and joint swelling.  Skin: Negative for color change and rash.  Neurological: Negative for dizziness, light-headedness and headaches.  Psychiatric/Behavioral: Negative  for agitation and dysphoric mood.       Objective:    Physical Exam  Constitutional: She appears well-developed and well-nourished. No distress.  HENT:  Nose: Nose normal.  Mouth/Throat: Oropharynx is clear and moist.  Neck: Neck supple. No thyromegaly present.  Cardiovascular: Normal rate and regular rhythm.   Pulmonary/Chest: Breath sounds normal. No respiratory distress. She has no wheezes.  Abdominal: Soft. Bowel sounds are normal. There is no tenderness.  Musculoskeletal: She exhibits no edema or tenderness.  Lymphadenopathy:    She has no cervical adenopathy.  Skin: No rash noted. No erythema.  Psychiatric: She has a normal mood and affect. Her behavior is normal.    BP (!) 148/66   Pulse 76   Temp 97.9 F (36.6 C) (Oral)   Resp 16    Ht 5\' 5"  (1.651 m)   Wt 111 lb (50.3 kg)   SpO2 96%   BMI 18.47 kg/m  Wt Readings from Last 3 Encounters:  10/27/16 111 lb (50.3 kg)  07/16/16 107 lb 12.8 oz (48.9 kg)  07/14/16 115 lb (52.2 kg)     Lab Results  Component Value Date   WBC 4.3 10/27/2016   HGB 14.0 10/27/2016   HCT 42.8 10/27/2016   PLT 211.0 10/27/2016   GLUCOSE 100 (H) 10/27/2016   CHOL 254 (H) 05/28/2015   TRIG 100.0 05/28/2015   HDL 58.40 05/28/2015   LDLDIRECT 163.5 01/12/2013   LDLCALC 176 (H) 05/28/2015   ALT 6 03/01/2016   AST 12 03/01/2016   NA 142 10/27/2016   K 4.5 10/27/2016   CL 102 10/27/2016   CREATININE 0.76 10/27/2016   BUN 12 10/27/2016   CO2 34 (H) 10/27/2016   TSH 2.31 10/27/2016   INR 1.38 06/01/2015   HGBA1C 5.7 10/04/2013    Dg Chest 2 View  Result Date: 07/14/2016 CLINICAL DATA:  83 y/o  F; weakness and shortness of breath. EXAM: CHEST  2 VIEW COMPARISON:  03/17/2016 chest radiograph. FINDINGS: Stable normal cardiac silhouette. Tortuous aorta with calcific atherosclerosis. No consolidation or pneumothorax. Chronic blunting of the costal diaphragmatic angles. Bilateral posterior Bochdalek hernias. Stable soft tissue enlargement of the right supraclavicular region. Moderate degenerative changes of thoracic spine. IMPRESSION: No active cardiopulmonary disease. Aortic atherosclerosis. Chronic blunting of costal diaphragmatic angles. Electronically Signed   By: Kristine Garbe M.D.   On: 07/14/2016 18:47       Assessment & Plan:   Problem List Items Addressed This Visit    Anemia    Has been stable.  Recheck cbc today.        Relevant Orders   CBC with Differential/Platelet (Completed)   Ferritin (Completed)   Anxiety    Discussed at length with her today.  She was concerned would get addicted to the medication.  She has previously been on lexapro for years.  Has done well previously.  Restart lexapro.  Follow.  Notify me if persistent problems.        Relevant  Orders   TSH (Completed)   Depression    She worries.  Some increased anxiety.  Restart lexapro.        RESOLVED: Encounter for palliative care   Essential hypertension, benign    Blood pressure doing ok on no medication.  Remain off.  Follow.        Relevant Orders   Basic metabolic panel (Completed)   GERD (gastroesophageal reflux disease)    Controlled on omeprazole.  Doing well.  No swallowing issues.  Follow.        History of CVA (cerebrovascular accident)    Off aspirin now secondary to previous GI bleeding.  Follow.        Hypercholesterolemia    Follow lipid panel.  She has eaten today.        Loss of weight    Weight up a few pounds from the previous check.  Continue to encourage increased po intake.  Follow.        Rectal bleeding    Has had no further bleeding.  Aspirin was stopped.  Previous w/up as outlined.  Felt not to be a good candidate for colonoscopy.  Follow cbc.       Schwannoma    Saw ENT.  Neck nodule felt to be c/w schwannoma.  She worries about the nodule.  Stable.  Follow.        SOB (shortness of breath)    Previously saw cardiology.  Did not feel further cardiac w/up warranted.  Overall stable.           [name], MD

## 2016-10-30 ENCOUNTER — Encounter: Payer: Self-pay | Admitting: Internal Medicine

## 2016-10-30 NOTE — Assessment & Plan Note (Signed)
Previously saw cardiology.  Did not feel further cardiac w/up warranted.  Overall stable.

## 2016-10-30 NOTE — Assessment & Plan Note (Signed)
Weight up a few pounds from the previous check.  Continue to encourage increased po intake.  Follow.

## 2016-10-30 NOTE — Assessment & Plan Note (Signed)
Follow lipid panel.  She has eaten today.

## 2016-10-30 NOTE — Assessment & Plan Note (Signed)
She worries.  Some increased anxiety.  Restart lexapro.

## 2016-10-30 NOTE — Assessment & Plan Note (Signed)
Saw ENT.  Neck nodule felt to be c/w schwannoma.  She worries about the nodule.  Stable.  Follow.

## 2016-10-30 NOTE — Assessment & Plan Note (Signed)
Blood pressure doing ok on no medication.  Remain off.  Follow.

## 2016-10-30 NOTE — Assessment & Plan Note (Signed)
Controlled on omeprazole.  Doing well.  No swallowing issues.  Follow.

## 2016-10-30 NOTE — Assessment & Plan Note (Signed)
Has been stable.  Recheck cbc today.

## 2016-10-30 NOTE — Assessment & Plan Note (Signed)
Has had no further bleeding.  Aspirin was stopped.  Previous w/up as outlined.  Felt not to be a good candidate for colonoscopy.  Follow cbc.

## 2016-10-30 NOTE — Assessment & Plan Note (Signed)
Discussed at length with her today.  She was concerned would get addicted to the medication.  She has previously been on lexapro for years.  Has done well previously.  Restart lexapro.  Follow.  Notify me if persistent problems.

## 2016-10-30 NOTE — Assessment & Plan Note (Signed)
Off aspirin now secondary to previous GI bleeding.  Follow.

## 2017-02-03 ENCOUNTER — Ambulatory Visit (INDEPENDENT_AMBULATORY_CARE_PROVIDER_SITE_OTHER): Payer: Medicare Other

## 2017-02-03 VITALS — BP 130/70 | HR 77 | Temp 98.5°F | Resp 16 | Ht 65.0 in | Wt 120.0 lb

## 2017-02-03 DIAGNOSIS — Z0000 Encounter for general adult medical examination without abnormal findings: Secondary | ICD-10-CM | POA: Diagnosis not present

## 2017-02-03 NOTE — Patient Instructions (Addendum)
Taylor Watson , Thank you for taking time to come for your Medicare Wellness Visit. I appreciate your ongoing commitment to your health goals. Please review the following plan we discussed and let me know if I can assist you in the future.   Make a follow up with Dr. Nicki Reaper as needed.    Bring a copy of your [place] and/or Living Will to be scanned into chart once completed.  Monitor and record oxygen level at home.  If shortness of breath worsens or oxygen is at 88 while sitting, walking or walking in place for 3 minutes notify your doctor.    Have a great day!  These are the goals we discussed: Goals    . Healthy Lifestyle          Stay hydrated Stay active Healthy meal choices        This is a list of the screening recommended for you and due dates:  Health Maintenance  Topic Date Due  . Eye exam for diabetics  12/25/1930  . Urine Protein Check  12/25/1930  . Hemoglobin A1C  04/05/2014  . Flu Shot  12/01/2016  . DEXA scan (bone density measurement)  02/03/2017*  . Tetanus Vaccine  02/03/2017*  . Pneumonia vaccines (1 of 2 - PCV13) 02/03/2017*  . Complete foot exam   03/17/2017*  . Mammogram  05/27/2020*  *Topic was postponed. The date shown is not the original due date.    Hearing Loss Hearing loss is a partial or total loss of the ability to hear. This can be temporary or permanent, and it can happen in one or both ears. Hearing loss may be referred to as deafness. Medical care is necessary to treat hearing loss properly and to prevent the condition from getting worse. Your hearing may partially or completely come back, depending on what caused your hearing loss and how severe it is. In some cases, hearing loss is permanent. What are the causes? Common causes of hearing loss include:  Too much wax in the ear canal.  Infection of the ear canal or middle ear.  Fluid in the middle ear.  Injury to the ear or surrounding area.  An object stuck  in the ear.  Prolonged exposure to loud sounds, such as music.  Less common causes of hearing loss include:  Tumors in the ear.  Viral or bacterial infections, such as meningitis.  A hole in the eardrum (perforated eardrum).  Problems with the hearing nerve that sends signals between the brain and the ear.  Certain medicines.  What are the signs or symptoms? Symptoms of this condition may include:  Difficulty telling the difference between sounds.  Difficulty following a conversation when there is background noise.  Lack of response to sounds in your environment. This may be most noticeable when you do not respond to startling sounds.  Needing to turn up the volume on the television, radio, etc.  Ringing in the ears.  Dizziness.  Pain in the ears.  How is this diagnosed? This condition is diagnosed based on a physical exam and a hearing test (audiometry). The audiometry test will be performed by a hearing specialist (audiologist). You may also be referred to an ear, nose, and throat (ENT) specialist (otolaryngologist). How is this treated? Treatment for recent onset of hearing loss may include:  Ear wax removal.  Being prescribed medicines to prevent infection (antibiotics).  Being prescribed medicines to reduce inflammation (corticosteroids).  Follow these instructions at home:  If you were prescribed an antibiotic medicine, take it as told by your health care provider. Do not stop taking the antibiotic even if you start to feel better.  Take over-the-counter and prescription medicines only as told by your health care provider.  Avoid loud noises.  Return to your normal activities as told by your health care provider. Ask your health care provider what activities are safe for you.  Keep all follow-up visits as told by your health care provider. This is important. Contact a health care provider if:  You feel dizzy.  You develop new symptoms.  You vomit or  feel nauseous.  You have a fever. Get help right away if:  You develop sudden changes in your vision.  You have severe ear pain.  You have new or increased weakness.  You have a severe headache. This information is not intended to replace advice given to you by your health care provider. Make sure you discuss any questions you have with your health care provider. Document Released: 04/19/2005 Document Revised: 09/25/2015 Document Reviewed: 09/04/2014 Elsevier Interactive Patient Education  2018 [employer].

## 2017-02-03 NOTE — Progress Notes (Signed)
Subjective:   Taylor Watson is a 83 y.o. female who presents for Medicare Annual (Subsequent) preventive examination.  Review of Systems:  No ROS.  Medicare Wellness Visit. Additional risk factors are reflected in the social history.  Cardiac Risk Factors include: advanced age (>60men, >30 women);hypertension     Objective:     Vitals: BP 130/70 (BP Location: Left Arm, Patient Position: Sitting, Cuff Size: Normal)   Pulse 77   Temp 98.5 F (36.9 C) (Oral)   Resp 16   Ht 5\' 5"  (1.651 m)   Wt 120 lb (54.4 kg)   SpO2 92%   BMI 19.97 kg/m   Body mass index is 19.97 kg/m.   Tobacco History  Smoking Status  . Former Smoker  Smokeless Tobacco  . Former Systems developer  . Types: Chew     Counseling given: Not Answered   Past Medical History:  Diagnosis Date  . Allergy   . Anemia   . CVA (cerebral vascular accident) ([place])   . Depression   . GERD (gastroesophageal reflux disease)   . History of blood transfusion   . History of kidney stones   . Hx: UTI (urinary tract infection)   . Hypercholesterolemia   . Hypertension    Past Surgical History:  Procedure Laterality Date  . CHOLECYSTECTOMY  1984  . NECK LESION BIOPSY  2015   Followed by Dr. Richardson Landry   Family History  Problem Relation Age of Onset  . Cancer Mother        unknown type  . Hypertension Mother   . Cancer Father        unknown type  . Cancer Daughter   . Colon cancer Unknown        grandfather   History  Sexual Activity  . Sexual activity: Not Currently    Outpatient Encounter Prescriptions as of 02/03/2017  Medication Sig  . escitalopram (LEXAPRO) 10 MG tablet TAKE ONE-HALF TABLET BY MOUTH DAILY  . ferrous sulfate 325 (65 FE) MG tablet Take 325 mg by mouth daily.  Marland Kitchen omeprazole (PRILOSEC) 20 MG capsule Take 1 capsule (20 mg total) by mouth daily.  . timolol (BETIMOL) 0.25 % ophthalmic solution 1-2 drops 2 (two) times daily.   No facility-administered encounter medications on file as of  02/03/2017.     Activities of Daily Living In your present state of health, do you have any difficulty performing the following activities: 02/03/2017 02/04/2016  Hearing? N Y  Comment - Difficulty hearing a whsper/ low tones.  Audiology referral deferred this year per patient request.  Vision? N Y  Comment - Eye exam encouraged.  Next scheduled appointment is OCT 2017.  Difficulty concentrating or making decisions? N Y  Comment - Some memory delay  Walking or climbing stairs? Y Y  Comment Unsteady gait Unsteady gait.  Cane/walker in use at all times.  Uses hand rails when climbing stairs.  Dressing or bathing? N N  Doing errands, shopping? Y Y  Comment She does not drive Does not Physiological scientist and eating ? N N  Using the Toilet? N N  In the past six months, have you accidently leaked urine? Y Y  Comment Managed with daily pad Stable and followed by PCP  Do you have problems with loss of bowel control? N N  Managing your Medications? N N  Managing your Finances? N N  Comment - Lives with daughter, assists if needed.  Housekeeping or managing your Housekeeping? N N  Comment - Lives with daughter, assists if needed.  Some recent data might be hidden    Patient Care Team: [name], MD as PCP - General (Internal Medicine)    Assessment:    This is a routine wellness examination for [name]. The goal of the wellness visit is to assist the patient how to close the gaps in care and create a preventative care plan for the patient.   The roster of all physicians providing medical care to patient is listed in the Snapshot section of the chart.  Osteoporosis risk reviewed.    Safety issues reviewed; Smoke and carbon monoxide detectors in the home. No firearms in the home. Wears seatbelts when driving or riding with others. Patient does wear sunscreen or protective clothing when in direct sunlight. No violence in the home.  Patient is alert, normal appearance, oriented  to person/place/and time.  Correctly identified the president of the [place], recall of 3/3 words, and performing simple calculations. Displays appropriate judgement and can read correct time from watch face.   No new identified risk were noted.  No failures at ADL's or IADL's.  Ambulates with cane.  BMI- discussed the importance of a healthy diet, water intake and the benefits of aerobic exercise. Educational material provided.   24 hour diet recall: Breakfast: bacon, eggs  Lunch: chicken, cream potatoes  Dinner: 3 cookies with creme  Daily fluid intake: 2 cups of soda, 4-6 cups of water  Dentures- dentures.  Eye- Visual acuity not assessed per patient preference since they have regular follow up with the ophthalmologist.  Wears corrective lenses.  Influenza, Pneumococcal and  TDAP vaccine deferred per patient preference.  Follow up with insurance.  Educational material provided.  Patient Concerns: She notices SOB every other day when sitting at home.  Denies difficulty breathing today.  Declined walking up to 3 minutes today; walked 1 minute with pulse oximetry.  See results below.   Encouraged to follow up with PCP as needed.  O2 sats fluctuate from 94-92 while sitting, 91-89 standing, 90-89 walking.  Pulse oximetry given to her for use at home.  Hx of SOB.  Encouraged to monitor levels and contact PCP if SOB increases or oxygen reads at 88 while sitting, after walking or walking in place up to 3 minutes.  Denies chest pain.  Deferred to PCP for follow up.  Exercise Activities and Dietary recommendations Current Exercise Habits: Home exercise routine, Type of exercise: walking (chair exercises), Time (Minutes): 10, Frequency (Times/Week): 2, Weekly Exercise (Minutes/Week): 20, Intensity: Mild  Goals    . Healthy Lifestyle          Stay hydrated Stay active Healthy meal choices       Fall Risk Fall Risk  02/03/2017 03/01/2016 02/04/2016 01/09/2014 01/09/2014  Falls in the past year?  No No No Yes Yes  Number falls in past yr: - - - 1 1  Injury with Fall? - - - No -  Risk for fall due to : - - - - Impaired balance/gait   Depression Screen PHQ 2/9 Scores 02/03/2017 03/01/2016 02/04/2016 01/09/2014  PHQ - 2 Score 0 0 0 0  PHQ- 9 Score 0 - - -     Cognitive Function MMSE - Mini Mental State Exam 02/04/2016  Orientation to time 5  Orientation to Place 5  Registration 3  Attention/ Calculation 5  Recall 3  Language- name 2 objects 2  Language- repeat 1  Language- follow 3 step command 3  Language-  read & follow direction 1  Write a sentence 1  Copy design 1  Total score 30     6CIT Screen 02/03/2017  What Year? 0 points  What month? 0 points  What time? 0 points  Count back from 20 0 points  Months in reverse 0 points    Immunization History  Administered Date(s) Administered  . PPD Test 11/06/2013   Screening Tests Health Maintenance  Topic Date Due  . OPHTHALMOLOGY EXAM  12/25/1930  . URINE MICROALBUMIN  12/25/1930  . HEMOGLOBIN A1C  04/05/2014  . INFLUENZA VACCINE  12/01/2016  . DEXA SCAN  02/03/2017 (Originally 12/24/1985)  . TETANUS/TDAP  02/03/2017 (Originally 12/25/1939)  . PNA vac Low Risk Adult (1 of 2 - PCV13) 02/03/2017 (Originally 12/24/1985)  . FOOT EXAM  03/17/2017 (Originally 12/25/1930)  . MAMMOGRAM  05/27/2020 (Originally 12/25/1938)      Plan:   End of life planning; Advanced aging; Advanced directives discussed.  Additional information provided for the completion of HCPOA/Living Will.  Documents and resources reviewed.  Time spent on this topic is 20 minutes.  I have personally reviewed and noted the following in the patient's chart:   . Medical and social history . Use of alcohol, tobacco or illicit drugs  . Current medications and supplements . Functional ability and status . Nutritional status . Physical activity . Advanced directives . List of other physicians . Hospitalizations, surgeries, and ER visits in previous 12  months . Vitals . Screenings to include cognitive, depression, and falls . Referrals and appointments  In addition, I have reviewed and discussed with patient certain preventive protocols, quality metrics, and best practice recommendations. A written personalized care plan for preventive services as well as general preventive health recommendations were provided to patient.     OBrien-Blaney, Broly Hatfield L, LPN  13/[id number]/8657   Reviewed above information.  Given sob and oxygen concerns, she needs to be evaluated.    Dr Nicki Reaper

## 2017-02-17 ENCOUNTER — Ambulatory Visit: Payer: Medicare Other | Admitting: Internal Medicine

## 2017-04-04 ENCOUNTER — Telehealth: Payer: Self-pay | Admitting: Internal Medicine

## 2017-04-04 NOTE — Telephone Encounter (Unsigned)
Copied from [place] [phone]. Topic: Inquiry >> Apr 04, 2017  3:46 PM Malena Catholic I, NT wrote: Reason for NGF:REVQWQ care they need medical recall for pt the [place] person is Vissie her num is [phone] thanks

## 2017-04-09 ENCOUNTER — Other Ambulatory Visit: Payer: Self-pay | Admitting: Internal Medicine

## 2017-05-06 ENCOUNTER — Ambulatory Visit: Payer: Medicare Other | Admitting: Internal Medicine

## 2017-05-11 ENCOUNTER — Encounter: Payer: Self-pay | Admitting: Internal Medicine

## 2017-05-11 ENCOUNTER — Ambulatory Visit (INDEPENDENT_AMBULATORY_CARE_PROVIDER_SITE_OTHER): Payer: Medicare Other | Admitting: Internal Medicine

## 2017-05-11 VITALS — BP 138/76 | HR 65 | Temp 97.7°F | Ht 65.0 in | Wt 119.6 lb

## 2017-05-11 DIAGNOSIS — I10 Essential (primary) hypertension: Secondary | ICD-10-CM

## 2017-05-11 DIAGNOSIS — R0602 Shortness of breath: Secondary | ICD-10-CM

## 2017-05-11 DIAGNOSIS — Z8673 Personal history of transient ischemic attack (TIA), and cerebral infarction without residual deficits: Secondary | ICD-10-CM

## 2017-05-11 DIAGNOSIS — K219 Gastro-esophageal reflux disease without esophagitis: Secondary | ICD-10-CM

## 2017-05-11 DIAGNOSIS — E7800 Pure hypercholesterolemia, unspecified: Secondary | ICD-10-CM

## 2017-05-11 DIAGNOSIS — D3610 Benign neoplasm of peripheral nerves and autonomic nervous system, unspecified: Secondary | ICD-10-CM | POA: Diagnosis not present

## 2017-05-11 DIAGNOSIS — F419 Anxiety disorder, unspecified: Secondary | ICD-10-CM | POA: Diagnosis not present

## 2017-05-11 LAB — LIPID PANEL
CHOL/HDL RATIO: 4
Cholesterol: 288 mg/dL — ABNORMAL HIGH (ref 0–200)
HDL: 68.9 mg/dL (ref >39.00)
LDL Cholesterol: 194 mg/dL — ABNORMAL HIGH (ref 0–99)
NONHDL: 219.22
Triglycerides: 124 mg/dL (ref 0.0–149.0)
VLDL: 24.8 mg/dL (ref 0.0–40.0)

## 2017-05-11 LAB — BASIC METABOLIC PANEL
BUN: 12 mg/dL (ref 6–23)
CO2: 33 mEq/L — ABNORMAL HIGH (ref 19–32)
Calcium: 10.3 mg/dL (ref 8.4–10.5)
Chloride: 101 mEq/L (ref 96–112)
Creatinine, Ser: 0.86 mg/dL (ref 0.40–1.20)
GFR: 78.62 mL/min (ref >60.00)
Glucose, Bld: 98 mg/dL (ref 70–99)
Potassium: 4.7 mEq/L (ref 3.5–5.1)
SODIUM: 141 meq/L (ref 135–145)

## 2017-05-11 LAB — HEPATIC FUNCTION PANEL
ALK PHOS: 81 U/L (ref 39–117)
ALT: 8 U/L (ref 0–35)
AST: 13 U/L (ref 0–37)
Albumin: 4.2 g/dL (ref 3.5–5.2)
BILIRUBIN DIRECT: 0.1 mg/dL (ref 0.0–0.3)
BILIRUBIN TOTAL: 1.1 mg/dL (ref 0.2–1.2)
Total Protein: 7.3 g/dL (ref 6.0–8.3)

## 2017-05-11 LAB — CBC WITH DIFFERENTIAL/PLATELET
BASOS PCT: 0.6 % (ref 0.0–3.0)
Basophils Absolute: 0 10*3/uL (ref 0.0–0.1)
EOS ABS: 0.2 10*3/uL (ref 0.0–0.7)
Eosinophils Relative: 5.6 % — ABNORMAL HIGH (ref 0.0–5.0)
HEMATOCRIT: 44.6 % (ref 36.0–46.0)
Hemoglobin: 14.7 g/dL (ref 12.0–15.0)
LYMPHS PCT: 27.2 % (ref 12.0–46.0)
Lymphs Abs: 1.2 10*3/uL (ref 0.7–4.0)
MCHC: 33 g/dL (ref 30.0–36.0)
MCV: 90.9 fl (ref 78.0–100.0)
Monocytes Absolute: 0.4 10*3/uL (ref 0.1–1.0)
Monocytes Relative: 9.2 % (ref 3.0–12.0)
NEUTROS ABS: 2.4 10*3/uL (ref 1.4–7.7)
Neutrophils Relative %: 57.4 % (ref 43.0–77.0)
PLATELETS: 194 10*3/uL (ref 150.0–400.0)
RBC: 4.91 Mil/uL (ref 3.87–5.11)
RDW: 14 % (ref 11.5–15.5)
WBC: 4.2 10*3/uL (ref 4.0–10.5)

## 2017-05-11 LAB — TSH: TSH: 2.67 u[IU]/mL (ref 0.35–4.50)

## 2017-05-11 NOTE — Progress Notes (Signed)
Patient ID: Taylor Watson, female   DOB: Aug 20, 1918, 84 y.o.   MRN: [id number]   Subjective:    Patient ID: Taylor Watson, female    DOB: 09-29-1918, 84 y.o.   MRN: [id number]  HPI  Patient here for a scheduled follow up. Has a history of right supraclavicular brachial plexus schwannoma.  Saw neurosurgery.  No further w/up planned.  Stable.  She is accompanied by her daughter.  History obtained from both of them.  She reports she feels she is doing better.  Some fatigue.  No chest pain.  Reports breathing is stable.  Concern regarding decreased O2 sats with ambulation.  Discussed with her today.  No acid reflux.  States is eating.  Weight is stable.  No abdominal pain.  Bowel stable.     Past Medical History:  Diagnosis Date  . Allergy   . Anemia   . CVA (cerebral vascular accident) ([place])   . Depression   . GERD (gastroesophageal reflux disease)   . History of blood transfusion   . History of kidney stones   . Hx: UTI (urinary tract infection)   . Hypercholesterolemia   . Hypertension    Past Surgical History:  Procedure Laterality Date  . CHOLECYSTECTOMY  1984  . NECK LESION BIOPSY  2015   Followed by Dr. Richardson Landry   Family History  Problem Relation Age of Onset  . Cancer Mother        unknown type  . Hypertension Mother   . Cancer Father        unknown type  . Cancer Daughter   . Colon cancer Unknown        grandfather   Social History   Socioeconomic History  . Marital status: Widowed    Spouse name: None  . Number of children: 5  . Years of education: None  . Highest education level: None  Social Needs  . Financial resource strain: None  . Food insecurity - worry: None  . Food insecurity - inability: None  . Transportation needs - medical: None  . Transportation needs - non-medical: None  Occupational History  . None  Tobacco Use  . Smoking status: Former Research scientist (life sciences)  . Smokeless tobacco: Former Systems developer    Types: Chew  Substance and Sexual Activity  .  Alcohol use: No    Alcohol/week: 0.0 oz  . Drug use: No  . Sexual activity: Not Currently  Other Topics Concern  . None  Social History Narrative  . None    Outpatient Encounter Medications as of 05/11/2017  Medication Sig  . escitalopram (LEXAPRO) 10 MG tablet TAKE ONE-HALF TABLET BY MOUTH DAILY  . ferrous sulfate 325 (65 FE) MG tablet Take 325 mg by mouth daily.  Marland Kitchen omeprazole (PRILOSEC) 20 MG capsule TAKE ONE CAPSULE BY MOUTH EVERY DAY  . timolol (BETIMOL) 0.25 % ophthalmic solution 1-2 drops 2 (two) times daily.   No facility-administered encounter medications on file as of 05/11/2017.     Review of Systems  Constitutional: Positive for fatigue. Negative for appetite change and unexpected weight change.  HENT: Negative for congestion and sinus pressure.   Respiratory: Negative for cough and chest tightness.        Breathing overall stable.   Cardiovascular: Negative for chest pain, palpitations and leg swelling.  Gastrointestinal: Negative for abdominal pain, diarrhea, nausea and vomiting.  Genitourinary: Negative for difficulty urinating and dysuria.  Musculoskeletal: Negative for joint swelling and myalgias.  Skin: Negative for color change and  rash.  Neurological: Negative for dizziness, light-headedness and headaches.  Psychiatric/Behavioral: Negative for agitation and dysphoric mood.       Objective:     Pulse ox with 5 minute walk - 87%  Physical Exam  Constitutional: She appears well-developed and well-nourished. No distress.  HENT:  Nose: Nose normal.  Mouth/Throat: Oropharynx is clear and moist.  Neck: Neck supple. No thyromegaly present.  Cardiovascular: Normal rate and regular rhythm.  Pulmonary/Chest: Breath sounds normal. No respiratory distress. She has no wheezes.  Abdominal: Soft. Bowel sounds are normal. There is no tenderness.  Musculoskeletal: She exhibits no edema or tenderness.  Lymphadenopathy:    She has no cervical adenopathy.  Skin: No rash  noted. No erythema.  Psychiatric: She has a normal mood and affect. Her behavior is normal.    BP 138/76   Pulse 65   Temp 97.7 F (36.5 C) (Oral)   Ht 5\' 5"  (1.651 m)   Wt 119 lb 9.6 oz (54.3 kg)   SpO2 91%   BMI 19.90 kg/m  Wt Readings from Last 3 Encounters:  05/11/17 119 lb 9.6 oz (54.3 kg)  02/03/17 120 lb (54.4 kg)  10/27/16 111 lb (50.3 kg)     Lab Results  Component Value Date   WBC 4.2 05/11/2017   HGB 14.7 05/11/2017   HCT 44.6 05/11/2017   PLT 194.0 05/11/2017   GLUCOSE 98 05/11/2017   CHOL 288 (H) 05/11/2017   TRIG 124.0 05/11/2017   HDL 68.90 05/11/2017   LDLDIRECT 163.5 01/12/2013   LDLCALC 194 (H) 05/11/2017   ALT 8 05/11/2017   AST 13 05/11/2017   NA 141 05/11/2017   K 4.7 05/11/2017   CL 101 05/11/2017   CREATININE 0.86 05/11/2017   BUN 12 05/11/2017   CO2 33 (H) 05/11/2017   TSH 2.67 05/11/2017   INR 1.38 06/01/2015   HGBA1C 5.7 10/04/2013    Dg Chest 2 View  Result Date: 07/14/2016 CLINICAL DATA:  84 y/o  F; weakness and shortness of breath. EXAM: CHEST  2 VIEW COMPARISON:  03/17/2016 chest radiograph. FINDINGS: Stable normal cardiac silhouette. Tortuous aorta with calcific atherosclerosis. No consolidation or pneumothorax. Chronic blunting of the costal diaphragmatic angles. Bilateral posterior Bochdalek hernias. Stable soft tissue enlargement of the right supraclavicular region. Moderate degenerative changes of thoracic spine. IMPRESSION: No active cardiopulmonary disease. Aortic atherosclerosis. Chronic blunting of costal diaphragmatic angles. Electronically Signed   By: Kristine Garbe M.D.   On: 07/14/2016 18:47       Assessment & Plan:   Problem List Items Addressed This Visit    Anxiety    Overall doing better.  Continue lexapro.       Essential hypertension, benign   Relevant Orders   CBC with Differential/Platelet (Completed)   TSH (Completed)   Basic metabolic panel (Completed)   GERD (gastroesophageal reflux  disease)    Controlled on omeprazole.       History of CVA (cerebrovascular accident)    Off aspirin now secondary to GI bleeding.  Stable.        Hypercholesterolemia - Primary    Check lipid panel today.       Relevant Orders   Hepatic function panel (Completed)   Lipid panel (Completed)   Schwannoma    Saw neurosurgery and ENT.  No further w/up.        SOB (shortness of breath)    Previously saw cardiology.  Did not feel further cardiac w/up warranted.  Discussed with her today. She  feels breathing overall stable.  No worsening symptoms.  5 minute walk- pulse ox 87%.  Discussed the need for oxygen.  She declines.  Discussed with her regarding overnight oximetry.  She initially agreed, but then declined.  Desires not to have oxygen.  Discussed importance of treating.  She declines.            [name], MD

## 2017-05-11 NOTE — Progress Notes (Signed)
Pre visit review using our clinic review tool, if applicable. No additional management support is needed unless otherwise documented below in the visit note. 

## 2017-05-12 ENCOUNTER — Ambulatory Visit: Payer: Self-pay

## 2017-05-12 NOTE — Telephone Encounter (Signed)
Lab results given

## 2017-05-14 ENCOUNTER — Encounter: Payer: Self-pay | Admitting: Internal Medicine

## 2017-05-14 NOTE — Assessment & Plan Note (Signed)
Off aspirin now secondary to GI bleeding.  Stable.

## 2017-05-14 NOTE — Assessment & Plan Note (Signed)
Check lipid panel today 

## 2017-05-14 NOTE — Assessment & Plan Note (Signed)
Controlled on omeprazole.   

## 2017-05-14 NOTE — Assessment & Plan Note (Signed)
Previously saw cardiology.  Did not feel further cardiac w/up warranted.  Discussed with her today. She feels breathing overall stable.  No worsening symptoms.  5 minute walk- pulse ox 87%.  Discussed the need for oxygen.  She declines.  Discussed with her regarding overnight oximetry.  She initially agreed, but then declined.  Desires not to have oxygen.  Discussed importance of treating.  She declines.

## 2017-05-14 NOTE — Assessment & Plan Note (Signed)
Overall doing better.  Continue lexapro.

## 2017-05-14 NOTE — Assessment & Plan Note (Signed)
Saw neurosurgery and ENT.  No further w/up.

## 2017-07-15 ENCOUNTER — Ambulatory Visit (INDEPENDENT_AMBULATORY_CARE_PROVIDER_SITE_OTHER): Payer: Medicare Other | Admitting: Internal Medicine

## 2017-07-15 ENCOUNTER — Encounter: Payer: Self-pay | Admitting: Internal Medicine

## 2017-07-15 DIAGNOSIS — D3610 Benign neoplasm of peripheral nerves and autonomic nervous system, unspecified: Secondary | ICD-10-CM | POA: Diagnosis not present

## 2017-07-15 DIAGNOSIS — R634 Abnormal weight loss: Secondary | ICD-10-CM

## 2017-07-15 DIAGNOSIS — K219 Gastro-esophageal reflux disease without esophagitis: Secondary | ICD-10-CM | POA: Diagnosis not present

## 2017-07-15 DIAGNOSIS — R51 Headache: Secondary | ICD-10-CM | POA: Diagnosis not present

## 2017-07-15 DIAGNOSIS — R519 Headache, unspecified: Secondary | ICD-10-CM

## 2017-07-15 DIAGNOSIS — I10 Essential (primary) hypertension: Secondary | ICD-10-CM | POA: Diagnosis not present

## 2017-07-15 DIAGNOSIS — F419 Anxiety disorder, unspecified: Secondary | ICD-10-CM

## 2017-07-15 DIAGNOSIS — E7800 Pure hypercholesterolemia, unspecified: Secondary | ICD-10-CM | POA: Diagnosis not present

## 2017-07-15 DIAGNOSIS — R0602 Shortness of breath: Secondary | ICD-10-CM

## 2017-07-15 MED ORDER — OMEPRAZOLE 20 MG PO CPDR: 20.0000 mg | DELAYED_RELEASE_CAPSULE | Freq: Two times a day (BID) | ORAL | 2 refills | Status: DC

## 2017-07-15 NOTE — Progress Notes (Signed)
Pre-visit discussion using our clinic review tool. No additional management support is needed unless otherwise documented below in the visit note.  

## 2017-07-15 NOTE — Progress Notes (Signed)
Patient ID: Taylor Watson, female   DOB: 01-04-1919, 84 y.o.   MRN: [id number]   Subjective:    Patient ID: Taylor Watson, female    DOB: 10-24-1918, 84 y.o.   MRN: [id number]  HPI  Patient here for a scheduled follow up.  Was stated was here to discuss oxygen. She is accompanied by her daughter.  history obtained from both of them.  Discussed again with her regarding qualification for oxygen.  She continues to decline.  States she feels she is breathing better.  Reports decreased sob with exertion.  No chest pain.  Some acid reflux.  States she is taking omeprazole, but will still notice some reflux at times.  No abdominal pain.  Bowels moving.  Eating.  Some headache at times.  Relates to sinus pressure.  Not constant and not severe.    Past Medical History:  Diagnosis Date  . Allergy   . Anemia   . CVA (cerebral vascular accident) ([employer])   . Depression   . GERD (gastroesophageal reflux disease)   . History of blood transfusion   . History of kidney stones   . Hx: UTI (urinary tract infection)   . Hypercholesterolemia   . Hypertension    Past Surgical History:  Procedure Laterality Date  . CHOLECYSTECTOMY  1984  . NECK LESION BIOPSY  2015   Followed by Dr. Richardson Landry   Family History  Problem Relation Age of Onset  . Cancer Mother        unknown type  . Hypertension Mother   . Cancer Father        unknown type  . Cancer Daughter   . Colon cancer Unknown        grandfather   Social History   Socioeconomic History  . Marital status: Widowed    Spouse name: None  . Number of children: 5  . Years of education: None  . Highest education level: None  Social Needs  . Financial resource strain: None  . Food insecurity - worry: None  . Food insecurity - inability: None  . Transportation needs - medical: None  . Transportation needs - non-medical: None  Occupational History  . None  Tobacco Use  . Smoking status: Former Research scientist (life sciences)  . Smokeless tobacco: Former Systems developer    Types: Chew  Substance and Sexual Activity  . Alcohol use: No    Alcohol/week: 0.0 oz  . Drug use: No  . Sexual activity: Not Currently  Other Topics Concern  . None  Social History Narrative  . None    Outpatient Encounter Medications as of 07/15/2017  Medication Sig  . escitalopram (LEXAPRO) 10 MG tablet TAKE ONE-HALF TABLET BY MOUTH DAILY  . ferrous sulfate 325 (65 FE) MG tablet Take 325 mg by mouth daily.  Marland Kitchen omeprazole (PRILOSEC) 20 MG capsule Take 1 capsule (20 mg total) by mouth 2 (two) times daily before a meal.  . timolol (BETIMOL) 0.25 % ophthalmic solution 1-2 drops 2 (two) times daily.  . [DISCONTINUED] omeprazole (PRILOSEC) 20 MG capsule TAKE ONE CAPSULE BY MOUTH EVERY DAY   No facility-administered encounter medications on file as of 07/15/2017.     Review of Systems  Constitutional: Negative for appetite change and unexpected weight change.  HENT: Positive for congestion and sinus pressure.   Respiratory: Negative for cough and chest tightness.        Breathing better.    Cardiovascular: Negative for chest pain, palpitations and leg swelling.  Gastrointestinal: Negative for abdominal  pain, diarrhea, nausea and vomiting.  Genitourinary: Negative for difficulty urinating and dysuria.  Musculoskeletal: Negative for joint swelling and myalgias.  Skin: Negative for color change and rash.  Neurological: Negative for dizziness and light-headedness.       Some intermittent headache. No headache now.   Psychiatric/Behavioral: Negative for agitation and dysphoric mood.       Objective:    Physical Exam  Constitutional: She appears well-developed and well-nourished. No distress.  HENT:  Nose: Nose normal.  Mouth/Throat: Oropharynx is clear and moist.  Neck: Neck supple. No thyromegaly present.  Cardiovascular: Normal rate and regular rhythm.  Pulmonary/Chest: Breath sounds normal. No respiratory distress. She has no wheezes.  Abdominal: Soft. Bowel sounds are  normal. There is no tenderness.  Musculoskeletal: She exhibits no edema or tenderness.  Lymphadenopathy:    She has no cervical adenopathy.  Skin: No rash noted. No erythema.  Psychiatric: She has a normal mood and affect. Her behavior is normal.    BP (!) 148/70 (BP Location: Left Arm, Patient Position: Sitting, Cuff Size: Normal)   Pulse 88   Temp 98.2 F (36.8 C) (Oral)   Resp 14   Ht 5\' 5"  (1.651 m)   Wt 117 lb 8 oz (53.3 kg)   SpO2 (!) 88%   BMI 19.55 kg/m  Wt Readings from Last 3 Encounters:  07/15/17 117 lb 8 oz (53.3 kg)  05/11/17 119 lb 9.6 oz (54.3 kg)  02/03/17 120 lb (54.4 kg)     Lab Results  Component Value Date   WBC 4.2 05/11/2017   HGB 14.7 05/11/2017   HCT 44.6 05/11/2017   PLT 194.0 05/11/2017   GLUCOSE 98 05/11/2017   CHOL 288 (H) 05/11/2017   TRIG 124.0 05/11/2017   HDL 68.90 05/11/2017   LDLDIRECT 163.5 01/12/2013   LDLCALC 194 (H) 05/11/2017   ALT 8 05/11/2017   AST 13 05/11/2017   NA 141 05/11/2017   K 4.7 05/11/2017   CL 101 05/11/2017   CREATININE 0.86 05/11/2017   BUN 12 05/11/2017   CO2 33 (H) 05/11/2017   TSH 2.67 05/11/2017   INR 1.38 06/01/2015   HGBA1C 5.7 10/04/2013    Dg Chest 2 View  Result Date: 07/14/2016 CLINICAL DATA:  84 y/o  F; weakness and shortness of breath. EXAM: CHEST  2 VIEW COMPARISON:  03/17/2016 chest radiograph. FINDINGS: Stable normal cardiac silhouette. Tortuous aorta with calcific atherosclerosis. No consolidation or pneumothorax. Chronic blunting of the costal diaphragmatic angles. Bilateral posterior Bochdalek hernias. Stable soft tissue enlargement of the right supraclavicular region. Moderate degenerative changes of thoracic spine. IMPRESSION: No active cardiopulmonary disease. Aortic atherosclerosis. Chronic blunting of costal diaphragmatic angles. Electronically Signed   By: Kristine Garbe M.D.   On: 07/14/2016 18:47       Assessment & Plan:   Problem List Items Addressed This Visit     Anxiety    On lexapro and doing well.  Follow.        Essential hypertension, benign    Blood pressure has been under good control.  Recheck 142/78.  Hold on additional medication.  Follow.        GERD (gastroesophageal reflux disease)    Some increased acid reflux.  Increase omeprazole to bid.  Follow.        Relevant Medications   omeprazole (PRILOSEC) 20 MG capsule   Headache    No significant headache now.  Some sinus congestion.  Saline nasal spray and nasacort nasal spray as outlined.  Follow.  Notify me if persistent headache or problems.        Hypercholesterolemia    Follow lipid panel.        Loss of weight    Encourage increased po intake.  Follow.        Schwannoma    Saw neurosurgery and ENT.  No further w/up.        SOB (shortness of breath)    Previously saw cardiology.  Did not feel further cardiac w/up warranted.  Discussed again with her today.  5 minute walk - pulse ox 87%.  Discussed again the need for oxygen.  She feels her breathing is better and continues to decline oxygen.            [name], MD

## 2017-07-15 NOTE — Patient Instructions (Signed)
nasacort nasal spray - 2 sprays each nostril one time per day.  Do this in the evening.    Saline nasal spray - flush nose at least 2-3x/day  Omeprazole 20mg  - take one capsule 30 minutes before breakfast and one capsule 30 minutes before your evening meal.

## 2017-07-16 ENCOUNTER — Other Ambulatory Visit: Payer: Self-pay | Admitting: Internal Medicine

## 2017-07-17 ENCOUNTER — Encounter: Payer: Self-pay | Admitting: Internal Medicine

## 2017-07-17 DIAGNOSIS — R51 Headache: Secondary | ICD-10-CM

## 2017-07-17 DIAGNOSIS — R519 Headache, unspecified: Secondary | ICD-10-CM | POA: Insufficient documentation

## 2017-07-17 NOTE — Assessment & Plan Note (Signed)
Previously saw cardiology.  Did not feel further cardiac w/up warranted.  Discussed again with her today.  5 minute walk - pulse ox 87%.  Discussed again the need for oxygen.  She feels her breathing is better and continues to decline oxygen.

## 2017-07-17 NOTE — Assessment & Plan Note (Signed)
On lexapro and doing well.  Follow.   

## 2017-07-17 NOTE — Assessment & Plan Note (Signed)
Blood pressure has been under good control.  Recheck 142/78.  Hold on additional medication.  Follow.

## 2017-07-17 NOTE — Assessment & Plan Note (Signed)
Saw neurosurgery and ENT.  No further w/up.

## 2017-07-17 NOTE — Assessment & Plan Note (Signed)
Follow lipid panel.   

## 2017-07-17 NOTE — Assessment & Plan Note (Signed)
No significant headache now.  Some sinus congestion.  Saline nasal spray and nasacort nasal spray as outlined.  Follow.  Notify me if persistent headache or problems.

## 2017-07-17 NOTE — Assessment & Plan Note (Signed)
Some increased acid reflux.  Increase omeprazole to bid.  Follow.

## 2017-07-17 NOTE — Assessment & Plan Note (Signed)
Encourage increased po intake.  Follow.   

## 2017-08-09 ENCOUNTER — Ambulatory Visit: Payer: Medicare Other | Admitting: Internal Medicine

## 2017-10-03 ENCOUNTER — Telehealth: Payer: Self-pay | Admitting: Internal Medicine

## 2017-10-03 NOTE — Telephone Encounter (Signed)
Please advise 

## 2017-10-03 NOTE — Telephone Encounter (Signed)
Copied from [place] [id number]. Topic: Quick Communication - See Telephone Encounter >> Oct 03, 2017  2:29 PM Nils Flack wrote: CRM for notification. See Telephone encounter for: 10/03/17. Daughter called - she is asking for oxygen. Please call [phone]

## 2017-10-05 NOTE — Telephone Encounter (Signed)
Ok.  She has already qualified for oxygen.

## 2017-10-05 NOTE — Telephone Encounter (Signed)
Patient is requesting order for oxygen. She qualified for it in January but refused. She is wanting the portable cylinders. No preference as to which company we submit to. No acute symptoms noted at this time. Ok to start orders?

## 2017-10-07 ENCOUNTER — Encounter: Payer: Self-pay | Admitting: Internal Medicine

## 2017-10-07 ENCOUNTER — Ambulatory Visit (INDEPENDENT_AMBULATORY_CARE_PROVIDER_SITE_OTHER): Payer: Medicare Other | Admitting: Internal Medicine

## 2017-10-07 DIAGNOSIS — F419 Anxiety disorder, unspecified: Secondary | ICD-10-CM

## 2017-10-07 DIAGNOSIS — K219 Gastro-esophageal reflux disease without esophagitis: Secondary | ICD-10-CM

## 2017-10-07 DIAGNOSIS — D3610 Benign neoplasm of peripheral nerves and autonomic nervous system, unspecified: Secondary | ICD-10-CM | POA: Diagnosis not present

## 2017-10-07 DIAGNOSIS — I10 Essential (primary) hypertension: Secondary | ICD-10-CM

## 2017-10-07 DIAGNOSIS — R0602 Shortness of breath: Secondary | ICD-10-CM

## 2017-10-07 DIAGNOSIS — E7800 Pure hypercholesterolemia, unspecified: Secondary | ICD-10-CM | POA: Diagnosis not present

## 2017-10-07 DIAGNOSIS — D649 Anemia, unspecified: Secondary | ICD-10-CM | POA: Diagnosis not present

## 2017-10-07 NOTE — Telephone Encounter (Signed)
Process started for O2

## 2017-10-07 NOTE — Progress Notes (Signed)
Patient ID: TALIAH PORCHE, female   DOB: Jun 27, 1918, 84 y.o.   MRN: [id number]   Subjective:    Patient ID: Taylor Watson, female    DOB: 1918/05/18, 84 y.o.   MRN: [id number]  HPI  Patient here for a scheduled follow up.  She is accompanied by her daughter.  History obtained from both of them.  Overall she feels she is doing relatively well.  No chest pain.  Breathing overall stable.  She did previously qualify for oxygen.  Had refused in the past.  Agreeable now.  No chest pain.  No abdominal pain.  Bowels moving.  Feels she is handling stress better.  Daughter feels things are stable overall.     Past Medical History:  Diagnosis Date  . Allergy   . Anemia   . CVA (cerebral vascular accident) ([place])   . Depression   . GERD (gastroesophageal reflux disease)   . History of blood transfusion   . History of kidney stones   . Hx: UTI (urinary tract infection)   . Hypercholesterolemia   . Hypertension    Past Surgical History:  Procedure Laterality Date  . CHOLECYSTECTOMY  1984  . NECK LESION BIOPSY  2015   Followed by Dr. Richardson Landry   Family History  Problem Relation Age of Onset  . Cancer Mother        unknown type  . Hypertension Mother   . Cancer Father        unknown type  . Cancer Daughter   . Colon cancer Unknown        grandfather   Social History   Socioeconomic History  . Marital status: Widowed    Spouse name: Not on file  . Number of children: 5  . Years of education: Not on file  . Highest education level: Not on file  Occupational History  . Not on file  Social Needs  . Financial resource strain: Not on file  . Food insecurity:    Worry: Not on file    Inability: Not on file  . Transportation needs:    Medical: Not on file    Non-medical: Not on file  Tobacco Use  . Smoking status: Former Research scientist (life sciences)  . Smokeless tobacco: Former Systems developer    Types: Chew  Substance and Sexual Activity  . Alcohol use: No    Alcohol/week: 0.0 oz  . Drug use: No  .  Sexual activity: Not Currently  Lifestyle  . Physical activity:    Days per week: Not on file    Minutes per session: Not on file  . Stress: Not on file  Relationships  . Social connections:    Talks on phone: Not on file    Gets together: Not on file    Attends religious service: Not on file    Active member of club or organization: Not on file    Attends meetings of clubs or organizations: Not on file    Relationship status: Not on file  Other Topics Concern  . Not on file  Social History Narrative  . Not on file    Outpatient Encounter Medications as of 10/07/2017  Medication Sig  . escitalopram (LEXAPRO) 10 MG tablet TAKE ONE-HALF TABLET BY MOUTH DAILY  . ferrous sulfate 325 (65 FE) MG tablet Take 325 mg by mouth daily.  Marland Kitchen omeprazole (PRILOSEC) 20 MG capsule Take 1 capsule (20 mg total) by mouth 2 (two) times daily before a meal.  . timolol (BETIMOL) 0.25 %  ophthalmic solution 1-2 drops 2 (two) times daily.   No facility-administered encounter medications on file as of 10/07/2017.     Review of Systems  Constitutional: Negative for appetite change and unexpected weight change.  HENT: Negative for congestion and sinus pressure.   Respiratory: Negative for cough and chest tightness.   Cardiovascular: Negative for chest pain, palpitations and leg swelling.  Gastrointestinal: Negative for abdominal pain, diarrhea, nausea and vomiting.  Genitourinary: Negative for difficulty urinating and dysuria.  Musculoskeletal: Negative for joint swelling and myalgias.  Skin: Negative for color change and rash.  Neurological: Negative for dizziness, light-headedness and headaches.  Psychiatric/Behavioral: Negative for agitation and dysphoric mood.       Objective:    Physical Exam  Constitutional: She appears well-developed and well-nourished. No distress.  HENT:  Nose: Nose normal.  Mouth/Throat: Oropharynx is clear and moist.  Neck: Neck supple. No thyromegaly present.    Cardiovascular: Normal rate and regular rhythm.  Pulmonary/Chest: Breath sounds normal. No respiratory distress. She has no wheezes.  Abdominal: Soft. Bowel sounds are normal. There is no tenderness.  Musculoskeletal: She exhibits no edema or tenderness.  Lymphadenopathy:    She has no cervical adenopathy.  Skin: No rash noted. No erythema.  Psychiatric: She has a normal mood and affect. Her behavior is normal.    BP 130/70 (BP Location: Left Arm, Patient Position: Sitting, Cuff Size: Normal)   Pulse 73   Temp 98.4 F (36.9 C) (Oral)   Resp 18   Wt 118 lb 12.8 oz (53.9 kg)   SpO2 92%   BMI 19.77 kg/m  Wt Readings from Last 3 Encounters:  10/07/17 118 lb 12.8 oz (53.9 kg)  07/15/17 117 lb 8 oz (53.3 kg)  05/11/17 119 lb 9.6 oz (54.3 kg)     Lab Results  Component Value Date   WBC 4.2 05/11/2017   HGB 14.7 05/11/2017   HCT 44.6 05/11/2017   PLT 194.0 05/11/2017   GLUCOSE 98 05/11/2017   CHOL 288 (H) 05/11/2017   TRIG 124.0 05/11/2017   HDL 68.90 05/11/2017   LDLDIRECT 163.5 01/12/2013   LDLCALC 194 (H) 05/11/2017   ALT 8 05/11/2017   AST 13 05/11/2017   NA 141 05/11/2017   K 4.7 05/11/2017   CL 101 05/11/2017   CREATININE 0.86 05/11/2017   BUN 12 05/11/2017   CO2 33 (H) 05/11/2017   TSH 2.67 05/11/2017   INR 1.38 06/01/2015   HGBA1C 5.7 10/04/2013    Dg Chest 2 View  Result Date: 07/14/2016 CLINICAL DATA:  84 y/o  F; weakness and shortness of breath. EXAM: CHEST  2 VIEW COMPARISON:  03/17/2016 chest radiograph. FINDINGS: Stable normal cardiac silhouette. Tortuous aorta with calcific atherosclerosis. No consolidation or pneumothorax. Chronic blunting of the costal diaphragmatic angles. Bilateral posterior Bochdalek hernias. Stable soft tissue enlargement of the right supraclavicular region. Moderate degenerative changes of thoracic spine. IMPRESSION: No active cardiopulmonary disease. Aortic atherosclerosis. Chronic blunting of costal diaphragmatic angles.  Electronically Signed   By: Kristine Garbe M.D.   On: 07/14/2016 18:47       Assessment & Plan:   Problem List Items Addressed This Visit    Anemia    Just had hgb checked 05/2017 - wnl.  Follow cbc.       Anxiety    Doing better.  On lexapro.  Follow.       Essential hypertension, benign    Blood pressure has been under reasonable control.  Not on any medication now.  Will remain off.  Follow pressures.        GERD (gastroesophageal reflux disease)    Controlled on omeprazole.        Hypercholesterolemia    Follow lipid panel.       Schwannoma    Saw neurosurgery and ENT.  No further w/up warranted.        SOB (shortness of breath)    Previously saw cardiology.  Felt no further cardiac w/up warranted.  Breathing stable.  5 minute walk - pulse ox 87%.  Had previously refused oxygen.  Agreeable now.  Will arrange.            [name], MD

## 2017-10-11 ENCOUNTER — Encounter: Payer: Self-pay | Admitting: Internal Medicine

## 2017-10-11 NOTE — Assessment & Plan Note (Signed)
Blood pressure has been under reasonable control.  Not on any medication now.  Will remain off.  Follow pressures.

## 2017-10-11 NOTE — Assessment & Plan Note (Signed)
Follow lipid panel.   

## 2017-10-11 NOTE — Assessment & Plan Note (Signed)
Previously saw cardiology.  Felt no further cardiac w/up warranted.  Breathing stable.  5 minute walk - pulse ox 87%.  Had previously refused oxygen.  Agreeable now.  Will arrange.

## 2017-10-11 NOTE — Assessment & Plan Note (Signed)
Doing better.  On lexapro.  Follow.

## 2017-10-11 NOTE — Assessment & Plan Note (Signed)
Saw neurosurgery and ENT.  No further w/up warranted.

## 2017-10-11 NOTE — Assessment & Plan Note (Signed)
Controlled on omeprazole.   

## 2017-10-11 NOTE — Assessment & Plan Note (Signed)
Just had hgb checked 05/2017 - wnl.  Follow cbc.

## 2017-11-28 ENCOUNTER — Telehealth: Payer: Self-pay | Admitting: Internal Medicine

## 2017-11-28 NOTE — Telephone Encounter (Signed)
Can we do a walk with her in a nurse visit since she has discussed O2 with the patient?

## 2017-11-28 NOTE — Telephone Encounter (Signed)
Requesting oxygen

## 2017-11-28 NOTE — Telephone Encounter (Signed)
Taylor Watson can you help with this?  She had appt 10/2017 to discuss oxygen.  Do we still need to do another walk?  If so, ok to schedule.  She needs oxygen.

## 2017-11-28 NOTE — Telephone Encounter (Signed)
Copied from [place]. Topic: Quick Communication - See Telephone Encounter >> Nov 28, 2017  2:10 PM Mylinda Latina, NT wrote: CRM for notification. See Telephone encounter for: 11/28/17. Patient grandson called and states the patient seen Dr. Nicki Reaper on 10/07/17 and she mentioned she was going to order oxygen therapy for the patient. They have not heard anything else about this please call CB# [phone]

## 2017-11-28 NOTE — Telephone Encounter (Signed)
Spoke with Taylor Watson to see what patient would need to get oxygen, her walk is expired because they are only good for 30 days. We can refer to pulmonary for them to schedule another walk and order oxygen.

## 2017-11-29 NOTE — Telephone Encounter (Signed)
Patient scheduled, daughter aware.

## 2017-11-29 NOTE — Telephone Encounter (Signed)
You have scheduled right per our conversation?

## 2017-12-08 ENCOUNTER — Ambulatory Visit (INDEPENDENT_AMBULATORY_CARE_PROVIDER_SITE_OTHER): Payer: Medicare Other

## 2017-12-08 DIAGNOSIS — R0602 Shortness of breath: Secondary | ICD-10-CM | POA: Diagnosis not present

## 2017-12-08 NOTE — Progress Notes (Addendum)
Patient comes in for walking O2 staturation to see if she qualifies for O2 for shortness of breath.  Patient was walked for 6 minutes sats failed to 87% on room air.   Patients saturation on room air at rest 90%.   While patient was ambulating without oxygen O2 was  87%.    O2 nasal cannula applied  patient stats stayed at 97%-98% continually  While ambulating 3-5 minutes.  Patient felt better with O2 on while ambulating.  Order was put in for 02 and was faxed to [facility]  .    Reviewed documentation.   Dr Nicki Reaper

## 2017-12-15 DIAGNOSIS — R0602 Shortness of breath: Secondary | ICD-10-CM | POA: Diagnosis not present

## 2017-12-15 DIAGNOSIS — E7800 Pure hypercholesterolemia, unspecified: Secondary | ICD-10-CM | POA: Diagnosis not present

## 2017-12-15 DIAGNOSIS — I10 Essential (primary) hypertension: Secondary | ICD-10-CM | POA: Diagnosis not present

## 2017-12-16 ENCOUNTER — Other Ambulatory Visit: Payer: Self-pay | Admitting: Internal Medicine

## 2017-12-21 NOTE — Telephone Encounter (Signed)
Please advise 

## 2017-12-21 NOTE — Telephone Encounter (Signed)
Daughter, [name] calling. Received oxygen tank in home, thought it was suppose to be portable. Does oxygen need to on 24/7?  Please advise. CB# [phone]

## 2017-12-22 NOTE — Telephone Encounter (Signed)
Patients daughter is wondering if patient should be wearing oxygen continuously or just up moving around? I am going to call [employer] and see if she will qualify for portable tanks.

## 2017-12-23 NOTE — Telephone Encounter (Signed)
Will need to also ask [employer] what would need to be documented for her to qualify for continuous oxygen.  Then check and see if we have the documentation to cover.

## 2017-12-28 NOTE — Telephone Encounter (Signed)
Left message for Jeneen Rinks at [place] to call back .,

## 2018-01-12 ENCOUNTER — Other Ambulatory Visit: Payer: Self-pay | Admitting: Internal Medicine

## 2018-01-15 DIAGNOSIS — R0602 Shortness of breath: Secondary | ICD-10-CM | POA: Diagnosis not present

## 2018-01-15 DIAGNOSIS — E7800 Pure hypercholesterolemia, unspecified: Secondary | ICD-10-CM | POA: Diagnosis not present

## 2018-01-15 DIAGNOSIS — I10 Essential (primary) hypertension: Secondary | ICD-10-CM | POA: Diagnosis not present

## 2018-01-25 NOTE — Telephone Encounter (Signed)
Left message for Taylor Watson at [place] to call back

## 2018-02-06 ENCOUNTER — Ambulatory Visit: Payer: Medicare Other

## 2018-02-14 DIAGNOSIS — R0602 Shortness of breath: Secondary | ICD-10-CM | POA: Diagnosis not present

## 2018-02-14 DIAGNOSIS — I10 Essential (primary) hypertension: Secondary | ICD-10-CM | POA: Diagnosis not present

## 2018-02-14 DIAGNOSIS — E7800 Pure hypercholesterolemia, unspecified: Secondary | ICD-10-CM | POA: Diagnosis not present

## 2018-03-17 DIAGNOSIS — R0602 Shortness of breath: Secondary | ICD-10-CM | POA: Diagnosis not present

## 2018-03-17 DIAGNOSIS — E7800 Pure hypercholesterolemia, unspecified: Secondary | ICD-10-CM | POA: Diagnosis not present

## 2018-03-17 DIAGNOSIS — I10 Essential (primary) hypertension: Secondary | ICD-10-CM | POA: Diagnosis not present

## 2018-04-10 ENCOUNTER — Other Ambulatory Visit: Payer: Self-pay | Admitting: Internal Medicine

## 2018-04-11 ENCOUNTER — Ambulatory Visit (INDEPENDENT_AMBULATORY_CARE_PROVIDER_SITE_OTHER): Payer: Medicare Other | Admitting: Internal Medicine

## 2018-04-11 ENCOUNTER — Other Ambulatory Visit: Payer: Self-pay | Admitting: Internal Medicine

## 2018-04-11 ENCOUNTER — Encounter: Payer: Self-pay | Admitting: Internal Medicine

## 2018-04-11 ENCOUNTER — Ambulatory Visit (INDEPENDENT_AMBULATORY_CARE_PROVIDER_SITE_OTHER): Payer: Medicare Other

## 2018-04-11 VITALS — BP 112/70 | HR 73 | Temp 97.8°F | Resp 16 | Ht 65.0 in | Wt 121.0 lb

## 2018-04-11 VITALS — BP 112/70 | HR 73 | Temp 97.8°F | Resp 16 | Wt 121.0 lb

## 2018-04-11 DIAGNOSIS — Z0000 Encounter for general adult medical examination without abnormal findings: Secondary | ICD-10-CM

## 2018-04-11 DIAGNOSIS — F419 Anxiety disorder, unspecified: Secondary | ICD-10-CM | POA: Diagnosis not present

## 2018-04-11 DIAGNOSIS — E7800 Pure hypercholesterolemia, unspecified: Secondary | ICD-10-CM | POA: Diagnosis not present

## 2018-04-11 DIAGNOSIS — K219 Gastro-esophageal reflux disease without esophagitis: Secondary | ICD-10-CM | POA: Diagnosis not present

## 2018-04-11 DIAGNOSIS — I10 Essential (primary) hypertension: Secondary | ICD-10-CM

## 2018-04-11 DIAGNOSIS — R7989 Other specified abnormal findings of blood chemistry: Secondary | ICD-10-CM

## 2018-04-11 DIAGNOSIS — D3610 Benign neoplasm of peripheral nerves and autonomic nervous system, unspecified: Secondary | ICD-10-CM

## 2018-04-11 DIAGNOSIS — R634 Abnormal weight loss: Secondary | ICD-10-CM

## 2018-04-11 DIAGNOSIS — D649 Anemia, unspecified: Secondary | ICD-10-CM | POA: Diagnosis not present

## 2018-04-11 LAB — CBC WITH DIFFERENTIAL/PLATELET
Basophils Absolute: 0 K/uL (ref 0.0–0.1)
Basophils Relative: 0.8 % (ref 0.0–3.0)
Eosinophils Absolute: 0.3 K/uL (ref 0.0–0.7)
Eosinophils Relative: 7.4 % — ABNORMAL HIGH (ref 0.0–5.0)
HCT: 41.6 % (ref 36.0–46.0)
Hemoglobin: 13.9 g/dL (ref 12.0–15.0)
Lymphocytes Relative: 29.4 % (ref 12.0–46.0)
Lymphs Abs: 1.3 K/uL (ref 0.7–4.0)
MCHC: 33.3 g/dL (ref 30.0–36.0)
MCV: 89 fl (ref 78.0–100.0)
Monocytes Absolute: 0.5 K/uL (ref 0.1–1.0)
Monocytes Relative: 11.7 % (ref 3.0–12.0)
Neutro Abs: 2.3 K/uL (ref 1.4–7.7)
Neutrophils Relative %: 50.7 % (ref 43.0–77.0)
Platelets: 190 K/uL (ref 150.0–400.0)
RBC: 4.68 Mil/uL (ref 3.87–5.11)
RDW: 14 % (ref 11.5–15.5)
WBC: 4.6 K/uL (ref 4.0–10.5)

## 2018-04-11 LAB — TSH: TSH: 5.06 u[IU]/mL — AB (ref 0.35–4.50)

## 2018-04-11 LAB — HEPATIC FUNCTION PANEL
ALBUMIN: 3.9 g/dL (ref 3.5–5.2)
ALT: 4 U/L (ref 0–35)
AST: 11 U/L (ref 0–37)
Alkaline Phosphatase: 88 U/L (ref 39–117)
Bilirubin, Direct: 0.1 mg/dL (ref 0.0–0.3)
Total Bilirubin: 0.7 mg/dL (ref 0.2–1.2)
Total Protein: 6.8 g/dL (ref 6.0–8.3)

## 2018-04-11 LAB — BASIC METABOLIC PANEL
BUN: 14 mg/dL (ref 6–23)
CALCIUM: 9.9 mg/dL (ref 8.4–10.5)
CO2: 31 mEq/L (ref 19–32)
CREATININE: 0.86 mg/dL (ref 0.40–1.20)
Chloride: 102 mEq/L (ref 96–112)
GFR: 78.47 mL/min (ref >60.00)
Glucose, Bld: 78 mg/dL (ref 70–99)
Potassium: 4.4 mEq/L (ref 3.5–5.1)
Sodium: 140 mEq/L (ref 135–145)

## 2018-04-11 NOTE — Patient Instructions (Addendum)
  Taylor Watson , Thank you for taking time to come for your Medicare Wellness Visit. I appreciate your ongoing commitment to your health goals. Please review the following plan we discussed and let me know if I can assist you in the future.   These are the goals we discussed: Goals      Patient Stated   . Schedule eye exam (pt-stated)     Get new glasses       This is a list of the screening recommended for you and due dates:  Health Maintenance  Topic Date Due  . Complete foot exam   12/25/1930  . Eye exam for diabetics  12/25/1930  . Urine Protein Check  12/25/1930  . Tetanus Vaccine  12/25/1939  . DEXA scan (bone density measurement)  12/24/1985  . Pneumonia vaccines (1 of 2 - PCV13) 12/24/1985  . Hemoglobin A1C  04/05/2014  . Mammogram  05/27/2020*  . Flu Shot  Discontinued  *Topic was postponed. The date shown is not the original due date.

## 2018-04-11 NOTE — Progress Notes (Signed)
Subjective:   Taylor Watson is a 84 y.o. female who presents for Medicare Annual (Subsequent) preventive examination.  Review of Systems:  No ROS.  Medicare Wellness Visit. Additional risk factors are reflected in the social history. Cardiac Risk Factors include: advanced age (>6men, >15 women);hypertension     Objective:     Vitals: BP 112/70   Pulse 73   Temp 97.8 F (36.6 C) (Oral)   Resp 16   Ht 5\' 5"  (1.651 m)   Wt 121 lb (54.9 kg)   SpO2 92%   BMI 20.14 kg/m   Body mass index is 20.14 kg/m.  Advanced Directives 04/11/2018 02/03/2017 07/14/2016 02/04/2016 05/31/2015 05/31/2015 05/29/2015  Does Patient Have a Medical Advance Directive? No No No No No No No  Would patient like information on creating a medical advance directive? No - Patient declined Yes (Inpatient - patient requests chaplain consult to create a medical advance directive) - Yes - Educational materials given No - patient declined information No - patient declined information No - patient declined information    Tobacco Social History   Tobacco Use  Smoking Status Former Smoker  Smokeless Tobacco Former Systems developer  . Types: Chew     Counseling given: Not Answered   Clinical Intake:  Pre-visit preparation completed: Yes  Pain : No/denies pain     Nutritional Status: BMI of 19-24  Normal Diabetes: No  How often do you need to have someone help you when you read instructions, pamphlets, or other written materials from your doctor or pharmacy?: 4 - Often        Past Medical History:  Diagnosis Date  . Allergy   . Anemia   . CVA (cerebral vascular accident) ([place])   . Depression   . GERD (gastroesophageal reflux disease)   . History of blood transfusion   . History of kidney stones   . Hx: UTI (urinary tract infection)   . Hypercholesterolemia   . Hypertension    Past Surgical History:  Procedure Laterality Date  . CHOLECYSTECTOMY  1984  . NECK LESION BIOPSY  2015   Followed by Dr.  Richardson Landry   Family History  Problem Relation Age of Onset  . Cancer Mother        unknown type  . Hypertension Mother   . Cancer Father        unknown type  . Cancer Daughter   . Colon cancer Unknown        grandfather   Social History   Socioeconomic History  . Marital status: Widowed    Spouse name: Not on file  . Number of children: 5  . Years of education: Not on file  . Highest education level: Not on file  Occupational History  . Not on file  Social Needs  . Financial resource strain: Not hard at all  . Food insecurity:    Worry: Never true    Inability: Never true  . Transportation needs:    Medical: No    Non-medical: No  Tobacco Use  . Smoking status: Former Research scientist (life sciences)  . Smokeless tobacco: Former Systems developer    Types: Chew  Substance and Sexual Activity  . Alcohol use: No    Alcohol/week: 0.0 standard drinks  . Drug use: No  . Sexual activity: Not Currently  Lifestyle  . Physical activity:    Days per week: 0 days    Minutes per session: Not on file  . Stress: Not at all  Relationships  .  Social connections:    Talks on phone: Not on file    Gets together: Not on file    Attends religious service: Not on file    Active member of club or organization: Not on file    Attends meetings of clubs or organizations: Not on file    Relationship status: Not on file  Other Topics Concern  . Not on file  Social History Narrative  . Not on file    Outpatient Encounter Medications as of 04/11/2018  Medication Sig  . escitalopram (LEXAPRO) 10 MG tablet TAKE ONE-HALF TABLET BY MOUTH DAILY  . ferrous sulfate 325 (65 FE) MG tablet Take 325 mg by mouth daily.  Marland Kitchen omeprazole (PRILOSEC) 20 MG capsule TAKE ONE CAPSULE BY MOUTH EVERY DAY  . timolol (BETIMOL) 0.25 % ophthalmic solution 1-2 drops 2 (two) times daily.   No facility-administered encounter medications on file as of 04/11/2018.     Activities of Daily Living In your present state of health, do you have any  difficulty performing the following activities: 04/11/2018  Hearing? Y  Comment Difficulty hearing some comversational tones. Audiology deferred per patient request.   Vision? Y  Comment Glaucoma  Difficulty concentrating or making decisions? Y  Comment Age appropriate.   Walking or climbing stairs? Y  Comment Unsteady gait. Cane in use when ambulating.   Dressing or bathing? Y  Comment Daughter assists as needed  Doing errands, shopping? Y  Comment She no longer drives.   Preparing Food and eating ? Y  Comment Daughter assists with meal prep.  Self feeds.   Using the Toilet? N  In the past six months, have you accidently leaked urine? Y  Comment Wears daily brief  Do you have problems with loss of bowel control? N  Managing your Medications? Y  Comment Daughter assists  Managing your Finances? Y  Comment Daughter assists  Housekeeping or managing your Housekeeping? Y  Comment Daughter assists  Some recent data might be hidden    Patient Care Team: [name], MD as PCP - General (Internal Medicine)    Assessment:   This is a routine wellness examination for [name].  The goal of the wellness visit is to assist the patient how to close the gaps in care and create a preventative care plan for the patient.   The roster of all physicians providing medical care to patient is listed in the Snapshot section of the chart.  Taking calcium VIT D as appropriate.  Dexa scan discussed.   Safety issues reviewed; Lives with daughter. Smoke and carbon monoxide detectors in the home. No firearms in the home. Wears seatbelts when riding with others. No violence in the home.  They do not have excessive sun exposure.  Discussed the need for sun protection: hats, long sleeves and the use of sunscreen if there is significant sun exposure.  Patient is alert, normal appearance, oriented to person/place/and time.   Daughter assists with ADLs as needed.  Ambulates with cane.   BMI-  discussed the importance of a healthy diet, water intake and the benefits of aerobic exercise. Educational material provided.   24 hour diet recall: Good appetite.  Regular diet.   Labs as directed.   Exercise Activities and Dietary recommendations Current Exercise Habits: The patient does not participate in regular exercise at present  Goals      Patient Stated   . Schedule eye exam (pt-stated)     Get new glasses  Fall Risk Fall Risk  05/11/2017 02/03/2017 03/01/2016 02/04/2016 01/09/2014  Falls in the past year? No No No No Yes  Number falls in past yr: - - - - 1  Injury with Fall? - - - - No  Risk for fall due to : - - - - -   Depression Screen PHQ 2/9 Scores 05/11/2017 02/03/2017 03/01/2016 02/04/2016  PHQ - 2 Score 0 0 0 0  PHQ- 9 Score - 0 - -     Cognitive Function MMSE - Mini Mental State Exam 02/04/2016  Orientation to time 5  Orientation to Place 5  Registration 3  Attention/ Calculation 5  Recall 3  Language- name 2 objects 2  Language- repeat 1  Language- follow 3 step command 3  Language- read & follow direction 1  Write a sentence 1  Copy design 1  Total score 30     6CIT Screen 02/03/2017  What Year? 0 points  What month? 0 points  What time? 0 points  Count back from 20 0 points  Months in reverse 0 points    Immunization History  Administered Date(s) Administered  . PPD Test 11/06/2013   Screening Tests Health Maintenance  Topic Date Due  . FOOT EXAM  12/25/1930  . OPHTHALMOLOGY EXAM  12/25/1930  . URINE MICROALBUMIN  12/25/1930  . TETANUS/TDAP  12/25/1939  . DEXA SCAN  12/24/1985  . PNA vac Low Risk Adult (1 of 2 - PCV13) 12/24/1985  . HEMOGLOBIN A1C  04/05/2014  . MAMMOGRAM  05/27/2020 (Originally [birth year]/1938)  . INFLUENZA VACCINE  Discontinued      Plan:    End of life planning; Advance aging; Advanced directives discussed. Copy of current HCPOA/Living Will declined.    I have personally reviewed and noted the following in the  patient's chart:   . Medical and social history . Use of alcohol, tobacco or illicit drugs  . Current medications and supplements . Functional ability and status . Nutritional status . Physical activity . Advanced directives . List of other physicians . Hospitalizations, surgeries, and ER visits in previous 12 months . Vitals . Screenings to include cognitive, depression, and falls . Referrals and appointments  In addition, I have reviewed and discussed with patient certain preventive protocols, quality metrics, and best practice recommendations. A written personalized care plan for preventive services as well as general preventive health recommendations were provided to patient.     Varney Biles, LPN  49/70/2637   Reviewed above information.  Agree with assessment and plan.   Dr Nicki Reaper

## 2018-04-11 NOTE — Progress Notes (Signed)
Order placed for f/u tsh.  

## 2018-04-11 NOTE — Progress Notes (Signed)
Patient ID: Taylor Watson, female   DOB: 07-18-1918, 84 y.o.   MRN: [id number]   Subjective:    Patient ID: Taylor Watson, female    DOB: Jul 30, 1918, 84 y.o.   MRN: [id number]  HPI  Patient here for a scheduled follow up.   She is accompanied by her daughter.  History obtained from both of them.  Reports things are relatively stable.  She is eating.  No chest pain.  Breathing stable.  States she will feel a little jittery at times.  Daughter feels is related to stress and worrying.  On low dose lexapro.  She had mentioned tapering off.  Daughter feels it helps her with the above.  After discussion, she decided to continue the low dose lexapro.  Discussed taking omeprazole for acid reflux.  No abdominal pain.  Bowels stable.       Past Medical History:  Diagnosis Date  . Allergy   . Anemia   . CVA (cerebral vascular accident) ([place])   . Depression   . GERD (gastroesophageal reflux disease)   . History of blood transfusion   . History of kidney stones   . Hx: UTI (urinary tract infection)   . Hypercholesterolemia   . Hypertension    Past Surgical History:  Procedure Laterality Date  . CHOLECYSTECTOMY  1984  . NECK LESION BIOPSY  2015   Followed by Dr. Richardson Landry   Family History  Problem Relation Age of Onset  . Cancer Mother        unknown type  . Hypertension Mother   . Cancer Father        unknown type  . Cancer Daughter   . Colon cancer Other        grandfather   Social History   Socioeconomic History  . Marital status: Widowed    Spouse name: Not on file  . Number of children: 5  . Years of education: Not on file  . Highest education level: Not on file  Occupational History  . Not on file  Social Needs  . Financial resource strain: Not hard at all  . Food insecurity:    Worry: Never true    Inability: Never true  . Transportation needs:    Medical: No    Non-medical: No  Tobacco Use  . Smoking status: Former Research scientist (life sciences)  . Smokeless tobacco: Former Systems developer      Types: Chew  Substance and Sexual Activity  . Alcohol use: No    Alcohol/week: 0.0 standard drinks  . Drug use: No  . Sexual activity: Not Currently  Lifestyle  . Physical activity:    Days per week: 0 days    Minutes per session: Not on file  . Stress: Not at all  Relationships  . Social connections:    Talks on phone: Not on file    Gets together: Not on file    Attends religious service: Not on file    Active member of club or organization: Not on file    Attends meetings of clubs or organizations: Not on file    Relationship status: Not on file  Other Topics Concern  . Not on file  Social History Narrative  . Not on file    Outpatient Encounter Medications as of 04/11/2018  Medication Sig  . escitalopram (LEXAPRO) 10 MG tablet TAKE ONE-HALF TABLET BY MOUTH DAILY  . ferrous sulfate 325 (65 FE) MG tablet Take 325 mg by mouth daily.  Marland Kitchen omeprazole (PRILOSEC) 20 MG  capsule TAKE ONE CAPSULE BY MOUTH EVERY DAY  . timolol (BETIMOL) 0.25 % ophthalmic solution 1-2 drops 2 (two) times daily.   No facility-administered encounter medications on file as of 04/11/2018.     Review of Systems     Objective:    Physical Exam  BP 112/70 (BP Location: Left Arm, Patient Position: Sitting, Cuff Size: Normal)   Pulse 73   Temp 97.8 F (36.6 C) (Oral)   Resp 16   Wt 121 lb (54.9 kg)   SpO2 92%   BMI 20.14 kg/m  Wt Readings from Last 3 Encounters:  04/11/18 121 lb (54.9 kg)  04/11/18 121 lb (54.9 kg)  10/07/17 118 lb 12.8 oz (53.9 kg)     Lab Results  Component Value Date   WBC 4.6 04/11/2018   HGB 13.9 04/11/2018   HCT 41.6 04/11/2018   PLT 190.0 04/11/2018   GLUCOSE 78 04/11/2018   CHOL 288 (H) 05/11/2017   TRIG 124.0 05/11/2017   HDL 68.90 05/11/2017   LDLDIRECT 163.5 01/12/2013   LDLCALC 194 (H) 05/11/2017   ALT 4 04/11/2018   AST 11 04/11/2018   NA 140 04/11/2018   K 4.4 04/11/2018   CL 102 04/11/2018   CREATININE 0.86 04/11/2018   BUN 14 04/11/2018    CO2 31 04/11/2018   TSH 5.06 (H) 04/11/2018   INR 1.38 06/01/2015   HGBA1C 5.7 10/04/2013    Dg Chest 2 View  Result Date: 07/14/2016 CLINICAL DATA:  84 y/o  F; weakness and shortness of breath. EXAM: CHEST  2 VIEW COMPARISON:  03/17/2016 chest radiograph. FINDINGS: Stable normal cardiac silhouette. Tortuous aorta with calcific atherosclerosis. No consolidation or pneumothorax. Chronic blunting of the costal diaphragmatic angles. Bilateral posterior Bochdalek hernias. Stable soft tissue enlargement of the right supraclavicular region. Moderate degenerative changes of thoracic spine. IMPRESSION: No active cardiopulmonary disease. Aortic atherosclerosis. Chronic blunting of costal diaphragmatic angles. Electronically Signed   By: Kristine Garbe M.D.   On: 07/14/2016 18:47       Assessment & Plan:   Problem List Items Addressed This Visit    Anemia - Primary    Recheck cbc to confirm stable/normal.        Relevant Orders   CBC with Differential/Platelet (Completed)   Anxiety    On lexapro.  Will continue.  Follow.        Essential hypertension, benign    Blood pressure under good control.  Continue same medication regimen.  Follow pressures.  Follow metabolic panel.        Relevant Orders   TSH (Completed)   Basic metabolic panel (Completed)   GERD (gastroesophageal reflux disease)    Discussed omeprazole for acid reflux.        Hypercholesterolemia    Follow lipid panel.        Relevant Orders   Hepatic function panel (Completed)   Loss of weight    Weight up a few pounds from last check.  Follow.        Schwannoma    Saw neurosurgery and ENT.  Felt no further w/up warranted.  Follow.            [name], MD

## 2018-04-16 DIAGNOSIS — E7800 Pure hypercholesterolemia, unspecified: Secondary | ICD-10-CM | POA: Diagnosis not present

## 2018-04-16 DIAGNOSIS — R0602 Shortness of breath: Secondary | ICD-10-CM | POA: Diagnosis not present

## 2018-04-16 DIAGNOSIS — I10 Essential (primary) hypertension: Secondary | ICD-10-CM | POA: Diagnosis not present

## 2018-04-17 ENCOUNTER — Encounter: Payer: Self-pay | Admitting: Internal Medicine

## 2018-04-17 NOTE — Assessment & Plan Note (Signed)
On lexapro.  Will continue.  Follow.

## 2018-04-17 NOTE — Assessment & Plan Note (Signed)
Discussed omeprazole for acid reflux.

## 2018-04-17 NOTE — Assessment & Plan Note (Signed)
Follow lipid panel.   

## 2018-04-17 NOTE — Assessment & Plan Note (Signed)
Blood pressure under good control.  Continue same medication regimen.  Follow pressures.  Follow metabolic panel.   

## 2018-04-17 NOTE — Assessment & Plan Note (Signed)
Weight up a few pounds from last check.  Follow  ?

## 2018-04-17 NOTE — Assessment & Plan Note (Signed)
Recheck cbc to confirm stable/normal.  

## 2018-04-17 NOTE — Assessment & Plan Note (Signed)
Saw neurosurgery and ENT.  Felt no further w/up warranted.  Follow.

## 2018-05-17 DIAGNOSIS — E7800 Pure hypercholesterolemia, unspecified: Secondary | ICD-10-CM | POA: Diagnosis not present

## 2018-05-17 DIAGNOSIS — I10 Essential (primary) hypertension: Secondary | ICD-10-CM | POA: Diagnosis not present

## 2018-05-17 DIAGNOSIS — R0602 Shortness of breath: Secondary | ICD-10-CM | POA: Diagnosis not present

## 2018-05-25 ENCOUNTER — Other Ambulatory Visit (INDEPENDENT_AMBULATORY_CARE_PROVIDER_SITE_OTHER): Payer: Medicare Other

## 2018-05-25 DIAGNOSIS — R7989 Other specified abnormal findings of blood chemistry: Secondary | ICD-10-CM

## 2018-05-25 LAB — TSH: TSH: 3.51 u[IU]/mL (ref 0.35–4.50)

## 2018-06-17 DIAGNOSIS — I10 Essential (primary) hypertension: Secondary | ICD-10-CM | POA: Diagnosis not present

## 2018-06-17 DIAGNOSIS — R0602 Shortness of breath: Secondary | ICD-10-CM | POA: Diagnosis not present

## 2018-06-17 DIAGNOSIS — E7800 Pure hypercholesterolemia, unspecified: Secondary | ICD-10-CM | POA: Diagnosis not present

## 2018-07-16 DIAGNOSIS — E7800 Pure hypercholesterolemia, unspecified: Secondary | ICD-10-CM | POA: Diagnosis not present

## 2018-07-16 DIAGNOSIS — R0602 Shortness of breath: Secondary | ICD-10-CM | POA: Diagnosis not present

## 2018-07-16 DIAGNOSIS — I10 Essential (primary) hypertension: Secondary | ICD-10-CM | POA: Diagnosis not present

## 2018-08-16 DIAGNOSIS — I10 Essential (primary) hypertension: Secondary | ICD-10-CM | POA: Diagnosis not present

## 2018-08-16 DIAGNOSIS — E7800 Pure hypercholesterolemia, unspecified: Secondary | ICD-10-CM | POA: Diagnosis not present

## 2018-08-16 DIAGNOSIS — R0602 Shortness of breath: Secondary | ICD-10-CM | POA: Diagnosis not present

## 2018-08-22 ENCOUNTER — Ambulatory Visit (INDEPENDENT_AMBULATORY_CARE_PROVIDER_SITE_OTHER): Payer: Medicare Other | Admitting: Internal Medicine

## 2018-08-22 ENCOUNTER — Other Ambulatory Visit: Payer: Self-pay

## 2018-08-22 ENCOUNTER — Encounter: Payer: Self-pay | Admitting: Internal Medicine

## 2018-08-22 DIAGNOSIS — J069 Acute upper respiratory infection, unspecified: Secondary | ICD-10-CM

## 2018-08-22 DIAGNOSIS — D3610 Benign neoplasm of peripheral nerves and autonomic nervous system, unspecified: Secondary | ICD-10-CM

## 2018-08-22 DIAGNOSIS — F419 Anxiety disorder, unspecified: Secondary | ICD-10-CM

## 2018-08-22 DIAGNOSIS — E7800 Pure hypercholesterolemia, unspecified: Secondary | ICD-10-CM | POA: Diagnosis not present

## 2018-08-22 DIAGNOSIS — D649 Anemia, unspecified: Secondary | ICD-10-CM

## 2018-08-22 DIAGNOSIS — K219 Gastro-esophageal reflux disease without esophagitis: Secondary | ICD-10-CM | POA: Diagnosis not present

## 2018-08-22 MED ORDER — CEFDINIR 300 MG PO CAPS: 300.0000 mg | ORAL_CAPSULE | Freq: Two times a day (BID) | ORAL | 0 refills | Status: DC

## 2018-08-22 NOTE — Progress Notes (Addendum)
Patient ID: Taylor Watson, female   DOB: [birth year], 85 y.o.   MRN: [id number] Virtual Visit via Telophone Note  This visit type was conducted due to national recommendations for restrictions regarding the COVID-19 pandemic (e.g. social distancing).  This format is felt to be most appropriate for this patient at this time.  All issues noted in this document were discussed and addressed.  No physical exam was performed (except for noted visual exam findings with Video Visits).   I connected with Taylor Watson on 08/22/18 at 10:00 AM EDT by telephone and verified that I am speaking with the correct person using two identifiers. Location patient: home Location provider: work Persons participating in the virtual visit: patient, provider and pts daughter [name].    I discussed the limitations, risks, security and privacy concerns of performing an evaluation and management service by telephone and the availability of in person appointments.  The patient expressed understanding and agreed to proceed.   Reason for visit: scheduled follow up.   HPI: This was a scheduled follow up.  History obtained from both pt and her daughter.  Reports increased cough.  Symptoms started around Easter.  Some increased recently.  Some runny nose.  No post nasal drainage.  No sinus pressure.  Increased cough - productive at times.  She feels her breathing is overall stable.  Taking omeprazole.  Acid reflux controlled.  Eating.  States has a good appetite.  Has been up walking.  No chest pain.     ROS: See pertinent positives and negatives per HPI.  Past Medical History:  Diagnosis Date  . Allergy   . Anemia   . CVA (cerebral vascular accident) ([place])   . Depression   . GERD (gastroesophageal reflux disease)   . History of blood transfusion   . History of kidney stones   . Hx: UTI (urinary tract infection)   . Hypercholesterolemia   . Hypertension     Past Surgical History:  Procedure Laterality Date  .  CHOLECYSTECTOMY  1984  . NECK LESION BIOPSY  2015   Followed by Dr. Richardson Landry    Family History  Problem Relation Age of Onset  . Cancer Mother        unknown type  . Hypertension Mother   . Cancer Father        unknown type  . Cancer Daughter   . Colon cancer Other        grandfather    SOCIAL HX: reviewed.    Current Outpatient Medications:  .  escitalopram (LEXAPRO) 10 MG tablet, TAKE ONE-HALF TABLET BY MOUTH DAILY, Disp: 30 tablet, Rfl: 3 .  ferrous sulfate 325 (65 FE) MG tablet, Take 325 mg by mouth daily., Disp: , Rfl:  .  omeprazole (PRILOSEC) 20 MG capsule, TAKE ONE CAPSULE BY MOUTH EVERY DAY, Disp: 90 capsule, Rfl: 1 .  timolol (BETIMOL) 0.25 % ophthalmic solution, 1-2 drops 2 (two) times daily., Disp: , Rfl:  .  cefdinir (OMNICEF) 300 MG capsule, Take 1 capsule (300 mg total) by mouth 2 (two) times daily., Disp: 20 capsule, Rfl: 0  EXAM:  GENERAL: alert.  Sounds to be in no acute distress.  Answering questions appropriately.    PSYCH/NEURO: pleasant and cooperative, no obvious depression or anxiety, speech and thought processing grossly intact  ASSESSMENT AND PLAN:  Discussed the following assessment and plan:  Anemia, unspecified type  Anxiety  Gastroesophageal reflux disease without esophagitis  Hypercholesterolemia  Schwannoma  Upper respiratory tract infection, unspecified type  Anemia Follow cbc.   Anxiety On lexapro.  Appears to be stable.    GERD (gastroesophageal reflux disease) On omeprazole.  Symptoms controlled.    Hypercholesterolemia Follow lipid panel.    Schwannoma Saw neurosurgery and ENT.  Felt no further w/up warranted.    URI (upper respiratory infection) Increased cough and congestion as outlined.  Treat with nasacort nasal spray and robitussin as directed.  Given persistence and worsening symptoms, will cover with omnicef.  Probiotic as directed.  Follow.  Call with update.      I discussed the assessment and treatment  plan with the patient. The patient was provided an opportunity to ask questions and all were answered. The patient agreed with the plan and demonstrated an understanding of the instructions.   The patient was advised to call back or seek an in-person evaluation if the symptoms worsen or if the condition fails to improve as anticipated.  I provided 20 minutes of non-face-to-face time during this encounter.   [name], MD

## 2018-08-26 DIAGNOSIS — J069 Acute upper respiratory infection, unspecified: Secondary | ICD-10-CM | POA: Insufficient documentation

## 2018-08-26 NOTE — Assessment & Plan Note (Signed)
Saw neurosurgery and ENT.  Felt no further w/up warranted.

## 2018-08-26 NOTE — Assessment & Plan Note (Signed)
Increased cough and congestion as outlined.  Treat with nasacort nasal spray and robitussin as directed.  Given persistence and worsening symptoms, will cover with omnicef.  Probiotic as directed.  Follow.  Call with update.

## 2018-08-26 NOTE — Assessment & Plan Note (Signed)
On lexapro.  Appears to be stable.

## 2018-08-26 NOTE — Assessment & Plan Note (Signed)
Follow cbc.  

## 2018-08-26 NOTE — Assessment & Plan Note (Signed)
On omeprazole.  Symptoms controlled.  

## 2018-08-26 NOTE — Assessment & Plan Note (Signed)
Follow lipid panel.   

## 2018-09-15 DIAGNOSIS — I10 Essential (primary) hypertension: Secondary | ICD-10-CM | POA: Diagnosis not present

## 2018-09-15 DIAGNOSIS — E7800 Pure hypercholesterolemia, unspecified: Secondary | ICD-10-CM | POA: Diagnosis not present

## 2018-09-15 DIAGNOSIS — R0602 Shortness of breath: Secondary | ICD-10-CM | POA: Diagnosis not present

## 2018-10-16 DIAGNOSIS — R0602 Shortness of breath: Secondary | ICD-10-CM | POA: Diagnosis not present

## 2018-10-16 DIAGNOSIS — E7800 Pure hypercholesterolemia, unspecified: Secondary | ICD-10-CM | POA: Diagnosis not present

## 2018-10-16 DIAGNOSIS — I10 Essential (primary) hypertension: Secondary | ICD-10-CM | POA: Diagnosis not present

## 2018-10-26 ENCOUNTER — Other Ambulatory Visit: Payer: Self-pay

## 2018-10-26 ENCOUNTER — Encounter: Payer: Self-pay | Admitting: Internal Medicine

## 2018-10-26 ENCOUNTER — Ambulatory Visit (INDEPENDENT_AMBULATORY_CARE_PROVIDER_SITE_OTHER): Payer: Medicare Other | Admitting: Internal Medicine

## 2018-10-26 DIAGNOSIS — D649 Anemia, unspecified: Secondary | ICD-10-CM

## 2018-10-26 DIAGNOSIS — E7800 Pure hypercholesterolemia, unspecified: Secondary | ICD-10-CM

## 2018-10-26 DIAGNOSIS — I10 Essential (primary) hypertension: Secondary | ICD-10-CM | POA: Diagnosis not present

## 2018-10-26 DIAGNOSIS — K219 Gastro-esophageal reflux disease without esophagitis: Secondary | ICD-10-CM | POA: Diagnosis not present

## 2018-10-26 DIAGNOSIS — F419 Anxiety disorder, unspecified: Secondary | ICD-10-CM | POA: Diagnosis not present

## 2018-10-26 DIAGNOSIS — D3610 Benign neoplasm of peripheral nerves and autonomic nervous system, unspecified: Secondary | ICD-10-CM

## 2018-10-26 MED ORDER — ESCITALOPRAM OXALATE 10 MG PO TABS: 5.0000 mg | ORAL_TABLET | Freq: Every day | ORAL | 1 refills | Status: DC

## 2018-10-26 MED ORDER — OMEPRAZOLE 20 MG PO CPDR: 20.0000 mg | DELAYED_RELEASE_CAPSULE | Freq: Every day | ORAL | 1 refills | Status: DC

## 2018-10-26 NOTE — Progress Notes (Signed)
Patient ID: Taylor Watson, female   DOB: July 18, 1918, 85 y.o.   MRN: [id number]   Virtual Visit via telephone Note  This visit type was conducted due to national recommendations for restrictions regarding the COVID-19 pandemic (e.g. social distancing).  This format is felt to be most appropriate for this patient at this time.  All issues noted in this document were discussed and addressed.  No physical exam was performed (except for noted visual exam findings with Video Visits).   I connected with [name] by telephone and verified that I am speaking with the correct person using two identifiers. Location patient: home Location provider: work Persons participating in the telephone visit: patient, provider and pts daughter esther.    I discussed the limitations, risks, security and privacy concerns of performing an evaluation and management service by telephone and the availability of in person appointments.  The patient expressed understanding and agreed to proceed.   Reason for visit: scheduled follow up.   HPI: Reports she is doing well.  Feels good.  Breathing stable.  Last visit was treated for cough and congestion.  Better now.  No increased cough, congestion or sob. Trying to stay in due to covid restrictions.  No fever.  No acid reflux.  No abdominal pain.  Bowels moving.  Eating well.  Good appetite.  Discussed stopping iron.  Last hgb and ferritin doing well.     ROS: See pertinent positives and negatives per HPI.  Past Medical History:  Diagnosis Date  . Allergy   . Anemia   . CVA (cerebral vascular accident) ([place])   . Depression   . GERD (gastroesophageal reflux disease)   . History of blood transfusion   . History of kidney stones   . Hx: UTI (urinary tract infection)   . Hypercholesterolemia   . Hypertension     Past Surgical History:  Procedure Laterality Date  . CHOLECYSTECTOMY  1984  . NECK LESION BIOPSY  2015   Followed by Dr. Richardson Landry    Family  History  Problem Relation Age of Onset  . Cancer Mother        unknown type  . Hypertension Mother   . Cancer Father        unknown type  . Cancer Daughter   . Colon cancer Other        grandfather    SOCIAL HX: reviewed.    Current Outpatient Medications:  .  escitalopram (LEXAPRO) 10 MG tablet, Take 0.5 tablets (5 mg total) by mouth daily., Disp: 45 tablet, Rfl: 1 .  omeprazole (PRILOSEC) 20 MG capsule, Take 1 capsule (20 mg total) by mouth daily., Disp: 90 capsule, Rfl: 1 .  timolol (BETIMOL) 0.25 % ophthalmic solution, 1-2 drops 2 (two) times daily., Disp: , Rfl:   EXAM:  GENERAL: alert.  Sounds to be in no acute distress.  Answering questions appropriately.    PSYCH/NEURO: pleasant and cooperative, no obvious depression or anxiety, speech and thought processing grossly intact  ASSESSMENT AND PLAN:  Discussed the following assessment and plan:  Anemia Follow cbc.   Anxiety Doing well on lexapro.    Essential hypertension, benign Blood pressure has been under good control. On no medication.  Follow.    GERD (gastroesophageal reflux disease) Controlled on omeprazole.  Follow.   Hypercholesterolemia Follow lipid panel.    Schwannoma Saw neurosurgery and ENT.  Felt no further w/up warranted.  Follow.     I discussed the assessment and treatment plan with the  patient. The patient was provided an opportunity to ask questions and all were answered. The patient agreed with the plan and demonstrated an understanding of the instructions.   The patient was advised to call back or seek an in-person evaluation if the symptoms worsen or if the condition fails to improve as anticipated.  I provided 12 minutes of non-face-to-face time during this encounter.   [name], MD

## 2018-10-29 ENCOUNTER — Encounter: Payer: Self-pay | Admitting: Internal Medicine

## 2018-10-29 NOTE — Assessment & Plan Note (Signed)
Controlled on omeprazole.  Follow.

## 2018-10-29 NOTE — Assessment & Plan Note (Signed)
Doing well on lexapro 

## 2018-10-29 NOTE — Assessment & Plan Note (Signed)
Follow cbc.  

## 2018-10-29 NOTE — Assessment & Plan Note (Signed)
Saw neurosurgery and ENT.  Felt no further w/up warranted.  Follow.

## 2018-10-29 NOTE — Assessment & Plan Note (Signed)
Follow lipid panel.   

## 2018-10-29 NOTE — Assessment & Plan Note (Signed)
Blood pressure has been under good control. On no medication.  Follow.

## 2018-11-15 DIAGNOSIS — E7800 Pure hypercholesterolemia, unspecified: Secondary | ICD-10-CM | POA: Diagnosis not present

## 2018-11-15 DIAGNOSIS — I10 Essential (primary) hypertension: Secondary | ICD-10-CM | POA: Diagnosis not present

## 2018-11-15 DIAGNOSIS — R0602 Shortness of breath: Secondary | ICD-10-CM | POA: Diagnosis not present

## 2018-12-16 DIAGNOSIS — R0602 Shortness of breath: Secondary | ICD-10-CM | POA: Diagnosis not present

## 2018-12-16 DIAGNOSIS — I10 Essential (primary) hypertension: Secondary | ICD-10-CM | POA: Diagnosis not present

## 2018-12-16 DIAGNOSIS — E7800 Pure hypercholesterolemia, unspecified: Secondary | ICD-10-CM | POA: Diagnosis not present

## 2019-01-16 DIAGNOSIS — R0602 Shortness of breath: Secondary | ICD-10-CM | POA: Diagnosis not present

## 2019-01-16 DIAGNOSIS — E7800 Pure hypercholesterolemia, unspecified: Secondary | ICD-10-CM | POA: Diagnosis not present

## 2019-01-16 DIAGNOSIS — I10 Essential (primary) hypertension: Secondary | ICD-10-CM | POA: Diagnosis not present

## 2019-02-15 DIAGNOSIS — R0602 Shortness of breath: Secondary | ICD-10-CM | POA: Diagnosis not present

## 2019-02-15 DIAGNOSIS — E7800 Pure hypercholesterolemia, unspecified: Secondary | ICD-10-CM | POA: Diagnosis not present

## 2019-02-15 DIAGNOSIS — I10 Essential (primary) hypertension: Secondary | ICD-10-CM | POA: Diagnosis not present

## 2019-03-18 DIAGNOSIS — I10 Essential (primary) hypertension: Secondary | ICD-10-CM | POA: Diagnosis not present

## 2019-03-18 DIAGNOSIS — R0602 Shortness of breath: Secondary | ICD-10-CM | POA: Diagnosis not present

## 2019-03-18 DIAGNOSIS — E7800 Pure hypercholesterolemia, unspecified: Secondary | ICD-10-CM | POA: Diagnosis not present

## 2019-04-10 DIAGNOSIS — Z719 Counseling, unspecified: Secondary | ICD-10-CM | POA: Diagnosis not present

## 2019-04-16 ENCOUNTER — Encounter: Payer: Self-pay | Admitting: Internal Medicine

## 2019-04-16 ENCOUNTER — Other Ambulatory Visit: Payer: Self-pay

## 2019-04-16 ENCOUNTER — Ambulatory Visit: Payer: Medicare Other

## 2019-04-16 ENCOUNTER — Ambulatory Visit (INDEPENDENT_AMBULATORY_CARE_PROVIDER_SITE_OTHER): Payer: Medicare Other | Admitting: Internal Medicine

## 2019-04-16 ENCOUNTER — Telehealth: Payer: Self-pay | Admitting: Internal Medicine

## 2019-04-16 DIAGNOSIS — K219 Gastro-esophageal reflux disease without esophagitis: Secondary | ICD-10-CM | POA: Diagnosis not present

## 2019-04-16 DIAGNOSIS — D649 Anemia, unspecified: Secondary | ICD-10-CM | POA: Diagnosis not present

## 2019-04-16 DIAGNOSIS — F419 Anxiety disorder, unspecified: Secondary | ICD-10-CM

## 2019-04-16 DIAGNOSIS — D3610 Benign neoplasm of peripheral nerves and autonomic nervous system, unspecified: Secondary | ICD-10-CM

## 2019-04-16 DIAGNOSIS — E7800 Pure hypercholesterolemia, unspecified: Secondary | ICD-10-CM

## 2019-04-16 MED ORDER — ESCITALOPRAM OXALATE 10 MG PO TABS: 5.0000 mg | ORAL_TABLET | Freq: Every day | ORAL | 1 refills | Status: DC

## 2019-04-16 MED ORDER — OMEPRAZOLE 20 MG PO CPDR: 20.0000 mg | DELAYED_RELEASE_CAPSULE | Freq: Every day | ORAL | 1 refills | Status: DC

## 2019-04-16 NOTE — Progress Notes (Signed)
Patient ID: Taylor Watson, female   DOB: 28-Oct-1918, 85 y.o.   MRN: [id number]   Virtual Visit via telephone Note  This visit type was conducted due to national recommendations for restrictions regarding the COVID-19 pandemic (e.g. social distancing).  This format is felt to be most appropriate for this patient at this time.  All issues noted in this document were discussed and addressed.  No physical exam was performed (except for noted visual exam findings with Video Visits).   I connected with Taylor Watson by telephone and verified that I am speaking with the correct person using two identifiers. Location patient: home Location provider: work  Persons participating in the telephone visit: patient, provider and pts daughter Taylor Watson  I discussed the limitations, risks, security and privacy concerns of performing an evaluation and management service by telephone and the availability of in person appointments.  The patient expressed understanding and agreed to proceed.   Reason for visit: scheduled follow up.   HPI: Reports things are stable.  Some increased stress. Her youngest daughter has cancer.  On lexapro.  Has good support.  Overall feels she is handling things relatively well.  Walks around her house with walker.  No recent falls.  No chest pain.  Reports some occasional sob when she gets anxious or excited.  Feels lexapro helps with this and feels things are stable.  No acid reflux.  Takes omeprazole.  No abdominal pain.  Bowels moving.  Occasional hot flash.  No sweating.  Some incontinence - stress incontinence.  States she will have the sensation to go to the bathroom and when she goes to stand up, some urine will leak out.  No dysuria.  No nocturia.  No fever.  Does have oxygen available.  Uses if feels needs.  Again, feels everything is stable.     ROS: See pertinent positives and negatives per HPI.  Past Medical History:  Diagnosis Date  . Allergy   . Anemia   . CVA  (cerebral vascular accident) ([place])   . Depression   . GERD (gastroesophageal reflux disease)   . History of blood transfusion   . History of kidney stones   . Hx: UTI (urinary tract infection)   . Hypercholesterolemia   . Hypertension     Past Surgical History:  Procedure Laterality Date  . CHOLECYSTECTOMY  1984  . NECK LESION BIOPSY  2015   Followed by Dr. Richardson Landry    Family History  Problem Relation Age of Onset  . Cancer Mother        unknown type  . Hypertension Mother   . Cancer Father        unknown type  . Cancer Daughter   . Colon cancer Other        grandfather    SOCIAL HX: reviewed.    Current Outpatient Medications:  .  escitalopram (LEXAPRO) 10 MG tablet, Take 0.5 tablets (5 mg total) by mouth daily., Disp: 45 tablet, Rfl: 1 .  omeprazole (PRILOSEC) 20 MG capsule, Take 1 capsule (20 mg total) by mouth daily., Disp: 90 capsule, Rfl: 1 .  timolol (BETIMOL) 0.25 % ophthalmic solution, 1-2 drops 2 (two) times daily., Disp: , Rfl:   EXAM:  GENERAL: alert.  Sounds to be in no acute distress.  Answering questions appropriately.    PSYCH/NEURO: pleasant and cooperative, no obvious depression or anxiety, speech and thought processing grossly intact  ASSESSMENT AND PLAN:  Discussed the following assessment and plan:  Anemia Follow cbc.  Anxiety Some increased stress with her daughter's medical issues.  On lexapro.  Overall feels things are stable.  Does not feel needs any further intervention.  Follow.   GERD (gastroesophageal reflux disease) Controlled on prilosec.   Hypercholesterolemia Follow lipid panel.   Schwannoma Saw neurosurgery and ENT.  No further w/up felt warranted.  Follow.     I discussed the assessment and treatment plan with the patient. The patient was provided an opportunity to ask questions and all were answered. The patient agreed with the plan and demonstrated an understanding of the instructions.   The patient was advised to  call back or seek an in-person evaluation if the symptoms worsen or if the condition fails to improve as anticipated.  I provided 15 minutes of non-face-to-face time during this encounter.   [name], MD

## 2019-04-16 NOTE — Telephone Encounter (Signed)
Unable to lm to schedule the following appt  4 month follow up  And fasting labs in 6-8 weeks (if comfortable coming in then).

## 2019-04-17 DIAGNOSIS — I10 Essential (primary) hypertension: Secondary | ICD-10-CM | POA: Diagnosis not present

## 2019-04-17 DIAGNOSIS — E7800 Pure hypercholesterolemia, unspecified: Secondary | ICD-10-CM | POA: Diagnosis not present

## 2019-04-17 DIAGNOSIS — R0602 Shortness of breath: Secondary | ICD-10-CM | POA: Diagnosis not present

## 2019-04-21 ENCOUNTER — Encounter: Payer: Self-pay | Admitting: Internal Medicine

## 2019-04-21 NOTE — Assessment & Plan Note (Signed)
Some increased stress with her daughter's medical issues.  On lexapro.  Overall feels things are stable.  Does not feel needs any further intervention.  Follow.

## 2019-04-21 NOTE — Assessment & Plan Note (Signed)
Controlled on prilosec.   

## 2019-04-21 NOTE — Assessment & Plan Note (Signed)
Follow cbc.  

## 2019-04-21 NOTE — Assessment & Plan Note (Signed)
Follow lipid panel.   

## 2019-04-21 NOTE — Assessment & Plan Note (Signed)
Saw neurosurgery and ENT.  No further w/up felt warranted.  Follow.

## 2019-05-17 ENCOUNTER — Other Ambulatory Visit: Payer: Self-pay

## 2019-05-17 ENCOUNTER — Encounter: Payer: Self-pay | Admitting: Emergency Medicine

## 2019-05-17 ENCOUNTER — Emergency Department: Payer: Medicare Other

## 2019-05-17 ENCOUNTER — Observation Stay
Admission: EM | Admit: 2019-05-17 | Discharge: 2019-05-19 | Disposition: A | Discharge status: Home / Self Care | Payer: Medicare Other | Attending: Internal Medicine | Admitting: Internal Medicine

## 2019-05-17 DIAGNOSIS — F329 Major depressive disorder, single episode, unspecified: Secondary | ICD-10-CM | POA: Insufficient documentation

## 2019-05-17 DIAGNOSIS — I10 Essential (primary) hypertension: Secondary | ICD-10-CM | POA: Diagnosis not present

## 2019-05-17 DIAGNOSIS — J441 Chronic obstructive pulmonary disease with (acute) exacerbation: Secondary | ICD-10-CM | POA: Insufficient documentation

## 2019-05-17 DIAGNOSIS — Z87891 Personal history of nicotine dependence: Secondary | ICD-10-CM | POA: Insufficient documentation

## 2019-05-17 DIAGNOSIS — E876 Hypokalemia: Secondary | ICD-10-CM | POA: Diagnosis present

## 2019-05-17 DIAGNOSIS — Z79899 Other long term (current) drug therapy: Secondary | ICD-10-CM | POA: Insufficient documentation

## 2019-05-17 DIAGNOSIS — R7989 Other specified abnormal findings of blood chemistry: Secondary | ICD-10-CM | POA: Diagnosis present

## 2019-05-17 DIAGNOSIS — Z888 Allergy status to other drugs, medicaments and biological substances status: Secondary | ICD-10-CM | POA: Diagnosis not present

## 2019-05-17 DIAGNOSIS — G9341 Metabolic encephalopathy: Secondary | ICD-10-CM | POA: Insufficient documentation

## 2019-05-17 DIAGNOSIS — R778 Other specified abnormalities of plasma proteins: Secondary | ICD-10-CM | POA: Diagnosis not present

## 2019-05-17 DIAGNOSIS — Z20822 Contact with and (suspected) exposure to covid-19: Secondary | ICD-10-CM | POA: Insufficient documentation

## 2019-05-17 DIAGNOSIS — R531 Weakness: Secondary | ICD-10-CM | POA: Diagnosis not present

## 2019-05-17 DIAGNOSIS — Z8673 Personal history of transient ischemic attack (TIA), and cerebral infarction without residual deficits: Secondary | ICD-10-CM | POA: Diagnosis not present

## 2019-05-17 DIAGNOSIS — F32A Depression, unspecified: Secondary | ICD-10-CM | POA: Diagnosis present

## 2019-05-17 DIAGNOSIS — K219 Gastro-esophageal reflux disease without esophagitis: Secondary | ICD-10-CM | POA: Diagnosis not present

## 2019-05-17 DIAGNOSIS — J439 Emphysema, unspecified: Secondary | ICD-10-CM | POA: Diagnosis not present

## 2019-05-17 DIAGNOSIS — I160 Hypertensive urgency: Secondary | ICD-10-CM | POA: Diagnosis present

## 2019-05-17 LAB — BASIC METABOLIC PANEL
Anion gap: 9 (ref 5–15)
BUN: 16 mg/dL (ref 8–23)
CO2: 30 mmol/L (ref 22–32)
Calcium: 9.4 mg/dL (ref 8.9–10.3)
Chloride: 100 mmol/L (ref 98–111)
Creatinine, Ser: 0.8 mg/dL (ref 0.44–1.00)
GFR calc Af Amer: >60 mL/min (ref >60)
GFR calc non Af Amer: >60 mL/min (ref >60)
Glucose, Bld: 110 mg/dL — ABNORMAL HIGH (ref 70–99)
Potassium: 2.9 mmol/L — ABNORMAL LOW (ref 3.5–5.1)
Sodium: 139 mmol/L (ref 135–145)

## 2019-05-17 LAB — TROPONIN I (HIGH SENSITIVITY): Troponin I (High Sensitivity): 19 ng/L — ABNORMAL HIGH (ref <18)

## 2019-05-17 LAB — CBC
HCT: 43.4 % (ref 36.0–46.0)
Hemoglobin: 13.5 g/dL (ref 12.0–15.0)
MCH: 28.4 pg (ref 26.0–34.0)
MCHC: 31.1 g/dL (ref 30.0–36.0)
MCV: 91.2 fL (ref 80.0–100.0)
Platelets: 180 10*3/uL (ref 150–400)
RBC: 4.76 MIL/uL (ref 3.87–5.11)
RDW: 13.8 % (ref 11.5–15.5)
WBC: 3.9 10*3/uL — ABNORMAL LOW (ref 4.0–10.5)
nRBC: 0 % (ref 0.0–0.2)

## 2019-05-17 NOTE — ED Triage Notes (Signed)
Patient presents to the ED with her daughter.  Patient reports falling asleep frequently, without warming, which daughter states has been going on for, "years" as well as feeling "jittery and dizzy" for the past few months.  Patient also reports increased forgetfulness.  Patient is alert and oriented to self and situation but not place or time.

## 2019-05-18 ENCOUNTER — Emergency Department: Payer: Medicare Other

## 2019-05-18 ENCOUNTER — Observation Stay: Payer: Medicare Other

## 2019-05-18 DIAGNOSIS — E876 Hypokalemia: Secondary | ICD-10-CM | POA: Diagnosis not present

## 2019-05-18 DIAGNOSIS — S0990XA Unspecified injury of head, initial encounter: Secondary | ICD-10-CM | POA: Diagnosis not present

## 2019-05-18 DIAGNOSIS — R0602 Shortness of breath: Secondary | ICD-10-CM | POA: Diagnosis not present

## 2019-05-18 DIAGNOSIS — J441 Chronic obstructive pulmonary disease with (acute) exacerbation: Secondary | ICD-10-CM | POA: Diagnosis not present

## 2019-05-18 DIAGNOSIS — G9341 Metabolic encephalopathy: Secondary | ICD-10-CM | POA: Diagnosis not present

## 2019-05-18 DIAGNOSIS — R778 Other specified abnormalities of plasma proteins: Secondary | ICD-10-CM | POA: Diagnosis not present

## 2019-05-18 DIAGNOSIS — I160 Hypertensive urgency: Secondary | ICD-10-CM | POA: Diagnosis not present

## 2019-05-18 DIAGNOSIS — I10 Essential (primary) hypertension: Secondary | ICD-10-CM | POA: Diagnosis not present

## 2019-05-18 DIAGNOSIS — Z20822 Contact with and (suspected) exposure to covid-19: Secondary | ICD-10-CM | POA: Diagnosis not present

## 2019-05-18 DIAGNOSIS — R531 Weakness: Secondary | ICD-10-CM | POA: Diagnosis not present

## 2019-05-18 DIAGNOSIS — E7800 Pure hypercholesterolemia, unspecified: Secondary | ICD-10-CM | POA: Diagnosis not present

## 2019-05-18 LAB — POC SARS CORONAVIRUS 2 AG: SARS Coronavirus 2 Ag: NEGATIVE

## 2019-05-18 LAB — TROPONIN I (HIGH SENSITIVITY)
Troponin I (High Sensitivity): 23 ng/L — ABNORMAL HIGH (ref <18)
Troponin I (High Sensitivity): 25 ng/L — ABNORMAL HIGH (ref <18)
Troponin I (High Sensitivity): 29 ng/L — ABNORMAL HIGH (ref <18)
Troponin I (High Sensitivity): 31 ng/L — ABNORMAL HIGH (ref <18)

## 2019-05-18 LAB — URINALYSIS, COMPLETE (UACMP) WITH MICROSCOPIC
Bacteria, UA: NONE SEEN
Bilirubin Urine: NEGATIVE
Glucose, UA: NEGATIVE mg/dL
Ketones, ur: NEGATIVE mg/dL
Leukocytes,Ua: NEGATIVE
Nitrite: NEGATIVE
Protein, ur: NEGATIVE mg/dL
Specific Gravity, Urine: 1.01 (ref 1.005–1.030)
pH: 6 (ref 5.0–8.0)

## 2019-05-18 LAB — SARS CORONAVIRUS 2 (TAT 6-24 HRS): SARS Coronavirus 2: NEGATIVE

## 2019-05-18 LAB — TSH: TSH: 2.387 u[IU]/mL (ref 0.350–4.500)

## 2019-05-18 MED ORDER — POTASSIUM CHLORIDE 20 MEQ/15ML (10%) PO SOLN
40.0000 meq | Freq: Once | ORAL | Status: AC
  Administered 2019-05-18: 40 meq via ORAL
  Filled 2019-05-18: qty 30

## 2019-05-18 MED ORDER — MAGNESIUM SULFATE IN D5W 1-5 GM/100ML-% IV SOLN
1.0000 g | Freq: Once | INTRAVENOUS | Status: AC
  Administered 2019-05-18: 1 g via INTRAVENOUS
  Filled 2019-05-18: qty 100

## 2019-05-18 MED ORDER — ASPIRIN EC 81 MG PO TBEC
81.0000 mg | DELAYED_RELEASE_TABLET | Freq: Every day | ORAL | Status: DC
  Administered 2019-05-18 – 2019-05-19 (×2): 81 mg via ORAL
  Filled 2019-05-18 (×2): qty 1

## 2019-05-18 MED ORDER — ESCITALOPRAM OXALATE 10 MG PO TABS
5.0000 mg | ORAL_TABLET | Freq: Every day | ORAL | Status: DC
  Administered 2019-05-19: 5 mg via ORAL
  Filled 2019-05-18: qty 0.5

## 2019-05-18 MED ORDER — HYDRALAZINE HCL 25 MG PO TABS
25.0000 mg | ORAL_TABLET | Freq: Three times a day (TID) | ORAL | Status: DC
  Administered 2019-05-18 (×2): 25 mg via ORAL
  Filled 2019-05-18 (×2): qty 1

## 2019-05-18 MED ORDER — HYDRALAZINE HCL 20 MG/ML IJ SOLN
5.0000 mg | INTRAMUSCULAR | Status: DC | PRN
  Filled 2019-05-18: qty 0.25

## 2019-05-18 MED ORDER — IPRATROPIUM-ALBUTEROL 0.5-2.5 (3) MG/3ML IN SOLN
3.0000 mL | Freq: Once | RESPIRATORY_TRACT | Status: AC
  Administered 2019-05-18: 3 mL via RESPIRATORY_TRACT
  Filled 2019-05-18: qty 3

## 2019-05-18 MED ORDER — ONDANSETRON HCL 4 MG/2ML IJ SOLN: 4.0000 mg | Freq: Three times a day (TID) | INTRAMUSCULAR | Status: DC | PRN

## 2019-05-18 MED ORDER — CEPHALEXIN 500 MG PO CAPS: 500.0000 mg | ORAL_CAPSULE | Freq: Two times a day (BID) | ORAL | Status: DC

## 2019-05-18 MED ORDER — ENOXAPARIN SODIUM 40 MG/0.4ML ~~LOC~~ SOLN
40.0000 mg | SUBCUTANEOUS | Status: DC
  Administered 2019-05-18: 40 mg via SUBCUTANEOUS
  Filled 2019-05-18: qty 0.4

## 2019-05-18 MED ORDER — PANTOPRAZOLE SODIUM 40 MG PO TBEC
40.0000 mg | DELAYED_RELEASE_TABLET | Freq: Every day | ORAL | Status: DC
  Administered 2019-05-19: 40 mg via ORAL
  Filled 2019-05-18: qty 1

## 2019-05-18 MED ORDER — ACETAMINOPHEN 325 MG PO TABS: 650.0000 mg | ORAL_TABLET | Freq: Four times a day (QID) | ORAL | Status: DC | PRN

## 2019-05-18 MED ORDER — TIMOLOL HEMIHYDRATE 0.25 % OP SOLN: 1.0000 [drp] | Freq: Two times a day (BID) | OPHTHALMIC | Status: DC

## 2019-05-18 MED ORDER — TIMOLOL MALEATE 0.25 % OP SOLN
1.0000 [drp] | Freq: Two times a day (BID) | OPHTHALMIC | Status: DC
  Administered 2019-05-18: 1 [drp] via OPHTHALMIC
  Filled 2019-05-18: qty 5

## 2019-05-18 MED ORDER — POTASSIUM CHLORIDE 20 MEQ/15ML (10%) PO SOLN
40.0000 meq | Freq: Once | ORAL | Status: DC
  Filled 2019-05-18: qty 30

## 2019-05-18 NOTE — ED Notes (Signed)
Report given to Nicole, RN

## 2019-05-18 NOTE — ED Notes (Signed)
Diaper wet.  Changed.  Skin intact, but has redness on bottom.  Ordered hospital bed for patient

## 2019-05-18 NOTE — H&P (Signed)
History and Physical    Taylor Watson Taylor Watson DOB: Jun 08, 1918 DOA: 05/17/2019  Referring MD/NP/PA:   PCP: [name], MD   Patient coming from:  The patient is coming from home.  At baseline, pt is independent for most of ADL.        Chief Complaint: Weakness, dizziness and AMS  HPI: Taylor Watson is a 86 y.o. female with medical history significant of hypertension, hyperlipidemia, stroke, GERD, depression, UTI, who presents with weakness, dizziness, altered mental status.  Per his grandson (I called his grandson via phone), normally patient is alert and able to talk with family members most of the time, but recently she seems to be more confused than baseline. She has "falling asleep abruptly without warning, feelings of dizziness, increasing forgetfulness". Pt daughter reported to EDP that her symptoms have been occurring for "years", however that they have worsened over the past week as the patient buried her daughter. Pt has nausea, but no vomiting, diarrhea.  He has some mild cough, no shortness breath or chest pain.  No fever or chills.  Does not seem to have abdominal pain or chest pain.  Her grandson states that the patient has chronic bilateral leg weakness, but on my exam physical examination, her right leg is weaker than the left. No facial droop or slurred speech.  ED Course: pt was found to have WBC 3.9, troponin 23, 25, negative urinalysis, potassium 2.9, renal function okay, temperature normal, elevated blood pressure 202/90, 187/115, oxygen saturation 90-96% on room air.  Chest x-ray showed emphysema.  CT head is negative for acute intracranial abnormalities.  MRI of the brain is negative for stroke.  Patient is placed on MedSurg bed for observation.  Review of Systems: could not be reviewed accurately due to altered mental status.  Allergy:  Allergies  Allergen Reactions  . Dyazide [Hydrochlorothiazide W-Triamterene]   . Monopril [Fosinopril]   . Toprol  Xl [Metoprolol Tartrate]   . Verapamil     Past Medical History:  Diagnosis Date  . Allergy   . Anemia   . CVA (cerebral vascular accident) ([place])   . Depression   . GERD (gastroesophageal reflux disease)   . History of blood transfusion   . History of kidney stones   . Hx: UTI (urinary tract infection)   . Hypercholesterolemia   . Hypertension     Past Surgical History:  Procedure Laterality Date  . CHOLECYSTECTOMY  1984  . NECK LESION BIOPSY  2015   Followed by Dr. Richardson Landry    Social History:  reports that she has quit smoking. She has quit using smokeless tobacco.  Her smokeless tobacco use included chew. She reports that she does not drink alcohol or use drugs.  Family History:  Family History  Problem Relation Age of Onset  . Cancer Mother        unknown type  . Hypertension Mother   . Cancer Father        unknown type  . Cancer Daughter   . Colon cancer Other        grandfather     Prior to Admission medications   Medication Sig Start Date End Date Taking? Authorizing Provider  escitalopram (LEXAPRO) 10 MG tablet Take 0.5 tablets (5 mg total) by mouth daily. 04/16/19  Yes [name], MD  omeprazole (PRILOSEC) 20 MG capsule Take 1 capsule (20 mg total) by mouth daily. 04/16/19  Yes [name], MD  timolol (BETIMOL) 0.25 % ophthalmic solution 1-2 drops 2 (  two) times daily.   Yes [provider]    Physical Exam: Vitals:   05/18/19 0600 05/18/19 0813 05/18/19 0910 05/18/19 1358  BP: (!) 185/112 (!) 184/100 (!) 187/115 (!) 202/111  Pulse: 68 81 86 81  Resp: 19 18 16 17   Temp:  98.5 F (36.9 C)  98.2 F (36.8 C)  TempSrc:  Oral  Oral  SpO2: 93% 96% 96% 92%  Weight:      Height:       General: Not in acute distress HEENT:       Eyes: PERRL, EOMI, no scleral icterus.       ENT: No discharge from the ears and nose, no pharynx injection, no tonsillar enlargement.        Neck: No JVD, no bruit, no mass felt. Heme: No neck lymph node  enlargement. Cardiac: S1/S2, RRR, No murmurs, No gallops or rubs. Respiratory:  No rales, wheezing, rhonchi or rubs. GI: Soft, nondistended, nontender, no rebound pain, no organomegaly, BS present. GU: No hematuria Ext: No pitting leg edema bilaterally. 2+DP/PT pulse bilaterally. Musculoskeletal: No joint deformities, No joint redness or warmth, no limitation of ROM in spin. Skin: No rashes.  Neuro: confused, knows her own name, but not oriented to place and time, cranial nerves II-XII grossly intact. Muscle strength 3/5 right leg and 4/5 in all other extremities,  Psych: Patient is not psychotic, no suicidal or hemocidal ideation.  Labs on Admission: I have personally reviewed following labs and imaging studies  CBC: Recent Labs  Lab 05/17/19 1649  WBC 3.9*  HGB 13.5  HCT 43.4  MCV 91.2  PLT 409   Basic Metabolic Panel: Recent Labs  Lab 05/17/19 1649  NA 139  K 2.9*  CL 100  CO2 30  GLUCOSE 110*  BUN 16  CREATININE 0.80  CALCIUM 9.4   GFR: Estimated Creatinine Clearance: 32.4 mL/min (by C-G formula based on SCr of 0.8 mg/dL). Liver Function Tests: No results for input(s): AST, ALT, ALKPHOS, BILITOT, PROT, ALBUMIN in the last 168 hours. No results for input(s): LIPASE, AMYLASE in the last 168 hours. No results for input(s): AMMONIA in the last 168 hours. Coagulation Profile: No results for input(s): INR, PROTIME in the last 168 hours. Cardiac Enzymes: No results for input(s): CKTOTAL, CKMB, CKMBINDEX, TROPONINI in the last 168 hours. BNP (last 3 results) No results for input(s): PROBNP in the last 8760 hours. HbA1C: No results for input(s): HGBA1C in the last 72 hours. CBG: No results for input(s): GLUCAP in the last 168 hours. Lipid Profile: No results for input(s): CHOL, HDL, LDLCALC, TRIG, CHOLHDL, LDLDIRECT in the last 72 hours. Thyroid Function Tests: No results for input(s): TSH, T4TOTAL, FREET4, T3FREE, THYROIDAB in the last 72 hours. Anemia Panel: No  results for input(s): VITAMINB12, FOLATE, FERRITIN, TIBC, IRON, RETICCTPCT in the last 72 hours. Urine analysis:    Component Value Date/Time   COLORURINE YELLOW (A) 05/18/2019 0829   APPEARANCEUR CLEAR (A) 05/18/2019 0829   APPEARANCEUR Clear 09/21/2011 0059   LABSPEC 1.010 05/18/2019 0829   LABSPEC 1.005 09/21/2011 0059   PHURINE 6.0 05/18/2019 0829   GLUCOSEU NEGATIVE 05/18/2019 0829   GLUCOSEU Negative 09/21/2011 0059   HGBUR SMALL (A) 05/18/2019 0829   BILIRUBINUR NEGATIVE 05/18/2019 0829   BILIRUBINUR Negative 09/21/2011 0059   KETONESUR NEGATIVE 05/18/2019 0829   PROTEINUR NEGATIVE 05/18/2019 0829   NITRITE NEGATIVE 05/18/2019 0829   LEUKOCYTESUR NEGATIVE 05/18/2019 0829   LEUKOCYTESUR 1+ 09/21/2011 0059   Sepsis Labs: @LABRCNTIP (procalcitonin:4,lacticidven:4) )No  results found for this or any previous visit (from the past 240 hour(s)).   Radiological Exams on Admission: DG Chest 2 View  Result Date: 05/17/2019 CLINICAL DATA:  Initial evaluation for acute hypoxia. EXAM: CHEST - 2 VIEW COMPARISON:  Prior radiograph from 07/14/2016. FINDINGS: Cardiac and mediastinal silhouettes are stable, and remain within normal limits. Tortuosity the intrathoracic aorta with associated aortic atherosclerosis noted. Lungs are hyperinflated with chronic coarsening of the interstitial markings, compatible with emphysema. No focal infiltrates. No pulmonary edema or pleural effusion. No pneumothorax. No acute osseous abnormality. No made of a ovoid soft tissue opacity at the right supraclavicular fossa, likely reflecting previously identified right supraclavicular lesion. This is stable from previous. IMPRESSION: 1. Emphysema.  No other active cardiopulmonary disease. 2.  Aortic Atherosclerosis (ICD10-I70.0). 3. Ovoid opacity at the right supraclavicular fossa, consistent with previously identified right supraclavicular lesion. Finding is stable from previous. Electronically Signed   By: Jeannine Boga M.D.   On: 05/17/2019 23:55   CT Head Wo Contrast  Result Date: 05/18/2019 CLINICAL DATA:  Altered mental status EXAM: CT HEAD WITHOUT CONTRAST TECHNIQUE: Contiguous axial images were obtained from the base of the skull through the vertex without intravenous contrast. COMPARISON:  Sep 10, 2013 FINDINGS: Brain: No evidence of acute territorial infarction, hemorrhage, hydrocephalus,extra-axial collection or mass lesion/mass effect. There is dilatation the ventricles and sulci consistent with age-related atrophy. Low-attenuation changes in the deep white matter consistent with small vessel ischemia. Vascular: No hyperdense vessel or unexpected calcification. Skull: The skull is intact. No fracture seen. Again noted is decreased density seen within the right parietotemporal skull. This is slightly progressed since the MRI. No cortical destruction or periosteal reaction is seen within this region. Sinuses/Orbits: The visualized paranasal sinuses and mastoid air cells are clear. The orbits and globes intact. Other: None IMPRESSION: No acute intracranial abnormality. Findings consistent with age related atrophy and chronic small vessel ischemia Decreased density within the right parietotemporal skull which is non-specific, slightly progressed the prior exam. Electronically Signed   By: Prudencio Pair M.D.   On: 05/18/2019 02:37   MR BRAIN WO CONTRAST  Result Date: 05/18/2019 CLINICAL DATA:  Right leg weakness.  Falling. EXAM: MRI HEAD WITHOUT CONTRAST TECHNIQUE: Multiplanar, multiecho pulse sequences of the brain and surrounding structures were obtained without intravenous contrast. COMPARISON:  Head CT same day.  MRI 09/10/2013. FINDINGS: Brain: Diffusion imaging does not show any acute or subacute infarction. No focal abnormality affects the brainstem or cerebellum. Cerebral hemispheres show moderate to marked changes of chronic small vessel disease throughout the deep and subcortical white matter. No  large vessel territory infarction. No mass lesion, hemorrhage, hydrocephalus or extra-axial collection. Vascular: Major vessels at the base of the brain show flow. Skull and upper cervical spine: Geographic calvarial lucency by CT is secondary to osteoporosis circumscripta type Paget's disease. Sinuses/Orbits: Negative Other: None IMPRESSION: No acute finding. Moderate to marked chronic small-vessel ischemic changes of the cerebral hemispheric white matter. Geographic lucency of the calvarium on the right at CT indicates osteoporosis circumscripta type Paget's disease. Electronically Signed   By: Nelson Chimes M.D.   On: 05/18/2019 14:52     EKG: Independently reviewed.  QTC 446, LAD, T wave version in V3-V6.  Assessment/Plan Principal Problem:   Acute metabolic encephalopathy Active Problems:   Depression   Hypertensive urgency   Elevated troponin   Hypokalemia   Acute metabolic encephalopathy: Etiology is not clear.  CT head is negative.  On physical examination, patient has  right leg weakness, but MRI of the brain is negative for stroke. -Placed on MedSurg bed for observation -Frequent neuro check -Check vitamin B12, TSH, RPR -pt/ot  Depression: -continue home meds   Hypertensive urgency: Blood pressure 202/90.  Patient is not taking blood pressure medications at home. -Start hydralazine 25 mg 3 times daily -IV hydralazine as needed  Elevated troponin: trop 19 -->23 -->25. No CP.  Likely due to demand ischemia -ASA -Check A1c, FLP -EKG in the morning  Hypokalemia: K=2.9 on admission. - Repleted - Check Mg level - Give 1 g of magnesium sulfate    DVT ppx: SQ Lovenox Code Status: Full code per his grandson who is the power of attorney Family Communication:  Yes, patient's grandson by phone Disposition Plan:  Anticipate discharge back to previous home environment Consults called:  none Admission status: Med-surg bed for obs  Date of Service 05/18/2019    [facility]   If 7PM-7AM, please contact night-coverage [email or web address] Password [facility] 05/18/2019, 4:50 PM

## 2019-05-18 NOTE — ED Notes (Signed)
Patient up to bedside commode for bowel movement and urination.  Patient is slightly unsteady on her feet and needs some assistance from this RN.

## 2019-05-18 NOTE — ED Provider Notes (Signed)
[facility] Emergency Department Provider Note  ____________________________________________   First MD Initiated Contact with Patient 05/17/19 2318     (approximate)  I have reviewed the triage vital signs and the nursing notes.   HISTORY  Chief Complaint Weakness    HPI Taylor Watson is a 86 y.o. female with below list of previous medical conditions presents to the emergency department in the care of her daughter secondary to "falling asleep abruptly without warning, feelings of jittery and dizziness, increasing forgetfulness.  Patient's daughter states that the symptoms have been occurring for "years" however that they have worsened over the past week as the patient buried her daughter.        Past Medical History:  Diagnosis Date  . Allergy   . Anemia   . CVA (cerebral vascular accident) ([place])   . Depression   . GERD (gastroesophageal reflux disease)   . History of blood transfusion   . History of kidney stones   . Hx: UTI (urinary tract infection)   . Hypercholesterolemia   . Hypertension     Patient Active Problem List   Diagnosis Date Noted  . URI (upper respiratory infection) 08/26/2018  . Headache 07/17/2017  . SOB (shortness of breath) 03/28/2016  . Near syncope 03/01/2016  . Anemia 10/28/2015  . Lower GI bleed   . Goals of care, counseling/discussion   . Blood loss anemia 06/01/2015  . GERD (gastroesophageal reflux disease)   . Depression   . Unsteady gait 02/08/2015  . Schwannoma 10/07/2013  . Rectal bleeding 04/03/2013  . Light headedness 01/19/2013  . Loss of weight 10/22/2012  . Hypercholesterolemia 10/22/2012  . History of CVA (cerebrovascular accident) 10/22/2012  . Anxiety 10/22/2012  . Essential hypertension, benign 10/22/2012    Past Surgical History:  Procedure Laterality Date  . CHOLECYSTECTOMY  1984  . NECK LESION BIOPSY  2015   Followed by Dr. Richardson Landry    Prior to Admission medications    Medication Sig Start Date End Date Taking? Authorizing Provider  escitalopram (LEXAPRO) 10 MG tablet Take 0.5 tablets (5 mg total) by mouth daily. 04/16/19   [name], MD  omeprazole (PRILOSEC) 20 MG capsule Take 1 capsule (20 mg total) by mouth daily. 04/16/19   [name], MD  timolol (BETIMOL) 0.25 % ophthalmic solution 1-2 drops 2 (two) times daily.    [provider]    Allergies Dyazide [hydrochlorothiazide w-triamterene], Monopril [fosinopril], Toprol xl [metoprolol tartrate], and Verapamil  Family History  Problem Relation Age of Onset  . Cancer Mother        unknown type  . Hypertension Mother   . Cancer Father        unknown type  . Cancer Daughter   . Colon cancer Other        grandfather    Social History Social History   Tobacco Use  . Smoking status: Former Research scientist (life sciences)  . Smokeless tobacco: Former Systems developer    Types: Chew  Substance Use Topics  . Alcohol use: No    Alcohol/week: 0.0 standard drinks  . Drug use: No    Review of Systems Constitutional: No fever/chills Eyes: No visual changes. ENT: No sore throat. Cardiovascular: Denies chest pain. Respiratory: Denies shortness of breath. Gastrointestinal: No abdominal pain.  No nausea, no vomiting.  No diarrhea.  No constipation. Genitourinary: Negative for dysuria. Musculoskeletal: Negative for neck pain.  Negative for back pain. Integumentary: Negative for rash. Neurological: Negative for headaches, focal weakness or numbness.  ____________________________________________   PHYSICAL EXAM:  VITAL SIGNS: ED Triage Vitals  Enc Vitals Group     BP 05/17/19 1634 (!) 178/55     Pulse Rate 05/17/19 1634 (!) 42     Resp 05/17/19 1634 16     Temp 05/17/19 1634 98.4 F (36.9 C)     Temp Source 05/17/19 1634 Oral     SpO2 05/17/19 1634 94 %     Weight 05/17/19 1635 52.2 kg (115 lb)     Height 05/17/19 1635 1.651 m (5\' 5" )     Head Circumference --      Peak Flow --      Pain Score  05/17/19 1635 0     Pain Loc --      Pain Edu? --      Excl. in [place]? --     Constitutional: Alert and oriented to self and situation but not time nor place Eyes: Conjunctivae are normal.  Head: Atraumatic. Mouth/Throat: Patient is wearing a mask. Neck: No stridor.  No meningeal signs.   Cardiovascular: Normal rate, regular rhythm. Good peripheral circulation. Grossly normal heart sounds. Respiratory: Normal respiratory effort. diffuse expiratory wheeze Gastrointestinal: Soft and nontender. No distention.  Musculoskeletal: No lower extremity tenderness nor edema. No gross deformities of extremities. Neurologic:  Normal speech and language. No gross focal neurologic deficits are appreciated.  Skin:  Skin is warm, dry and intact. Psychiatric: Mood and affect are normal. Speech and behavior are normal.  ____________________________________________   LABS (all labs ordered are listed, but only abnormal results are displayed)  Labs Reviewed  BASIC METABOLIC PANEL - Abnormal; Notable for the following components:      Result Value   Potassium 2.9 (*)    Glucose, Bld 110 (*)    All other components within normal limits  CBC - Abnormal; Notable for the following components:   WBC 3.9 (*)    All other components within normal limits  TROPONIN I (HIGH SENSITIVITY) - Abnormal; Notable for the following components:   Troponin I (High Sensitivity) 19 (*)    All other components within normal limits  URINALYSIS, COMPLETE (UACMP) WITH MICROSCOPIC  POC SARS CORONAVIRUS 2 AG -  ED   ____________________________________________  EKG  ED ECG REPORT I, [place] N , the attending physician, personally viewed and interpreted this ECG.   Date: 05/18/2019  EKG Time: 4:39 PM  Rate: 68  Rhythm: Normal sinus rhythm  Axis: Normal  Intervals:Normal  ST&T Change: None  ____________________________________________  RADIOLOGY I, [place] N , personally viewed and evaluated these  images (plain radiographs) as part of my medical decision making, as well as reviewing the written report by the radiologist.  ED MD interpretation: No active cardiopulmonary disease, emphysema and known ovoid opacity upper clavicular on the right.  Official radiology report(s): DG Chest 2 View  Result Date: 05/17/2019 CLINICAL DATA:  Initial evaluation for acute hypoxia. EXAM: CHEST - 2 VIEW COMPARISON:  Prior radiograph from 07/14/2016. FINDINGS: Cardiac and mediastinal silhouettes are stable, and remain within normal limits. Tortuosity the intrathoracic aorta with associated aortic atherosclerosis noted. Lungs are hyperinflated with chronic coarsening of the interstitial markings, compatible with emphysema. No focal infiltrates. No pulmonary edema or pleural effusion. No pneumothorax. No acute osseous abnormality. No made of a ovoid soft tissue opacity at the right supraclavicular fossa, likely reflecting previously identified right supraclavicular lesion. This is stable from previous. IMPRESSION: 1. Emphysema.  No other active cardiopulmonary disease. 2.  Aortic Atherosclerosis (ICD10-I70.0). 3. Ovoid opacity  at the right supraclavicular fossa, consistent with previously identified right supraclavicular lesion. Finding is stable from previous. Electronically Signed   By: Jeannine Boga M.D.   On: 05/17/2019 23:55      Procedures   ____________________________________________   INITIAL IMPRESSION / MDM / [place] / ED COURSE  As part of my medical decision making, I reviewed the following data within the [place] NUMBER   86 year old female present with above-stated history and physical exam secondary to generalized weakness and above-stated complaints.  In addition patient with a COPD exacerbation with current oxygen saturation of 91% on room air.  Patient given a DuoNeb IV Solu-Medrol however oxygen saturation remains 93%.            ____________________________________________  FINAL CLINICAL IMPRESSION(S) / ED DIAGNOSES  Final diagnoses:  Weakness  COPD exacerbation ([place])     MEDICATIONS GIVEN DURING THIS VISIT:  Medications - No data to display   ED Discharge Orders    None      *Please note:  Taylor Watson was evaluated in Emergency Department on 05/18/2019 for the symptoms described in the history of present illness. She was evaluated in the context of the global COVID-19 pandemic, which necessitated consideration that the patient might be at risk for infection with the SARS-CoV-2 virus that causes COVID-19. Institutional protocols and algorithms that pertain to the evaluation of patients at risk for COVID-19 are in a state of rapid change based on information released by regulatory bodies including the CDC and federal and state organizations. These policies and algorithms were followed during the patient's care in the ED.  Some ED evaluations and interventions may be delayed as a result of limited staffing during the pandemic.*  Note:  This document was prepared using Dragon voice recognition software and may include unintentional dictation errors.   Gregor Hams, MD 05/21/19 [phone]

## 2019-05-18 NOTE — ED Notes (Signed)
Pt assisted to the toilet for urine sample, bed linens changed and new depends placed for patient. Pt requiring moderate assist with walking, states that she usually uses a walking stick

## 2019-05-18 NOTE — ED Notes (Signed)
Pt placed on 2L O2 via nasal cannula

## 2019-05-19 DIAGNOSIS — G9341 Metabolic encephalopathy: Secondary | ICD-10-CM | POA: Diagnosis not present

## 2019-05-19 DIAGNOSIS — R531 Weakness: Secondary | ICD-10-CM | POA: Diagnosis not present

## 2019-05-19 LAB — VITAMIN B12: Vitamin B-12: 289 pg/mL (ref 180–914)

## 2019-05-19 LAB — BASIC METABOLIC PANEL
Anion gap: 9 (ref 5–15)
BUN: 14 mg/dL (ref 8–23)
CO2: 31 mmol/L (ref 22–32)
Calcium: 9 mg/dL (ref 8.9–10.3)
Chloride: 99 mmol/L (ref 98–111)
Creatinine, Ser: 0.81 mg/dL (ref 0.44–1.00)
GFR calc Af Amer: >60 mL/min (ref >60)
GFR calc non Af Amer: >60 mL/min (ref >60)
Glucose, Bld: 83 mg/dL (ref 70–99)
Potassium: 3.1 mmol/L — ABNORMAL LOW (ref 3.5–5.1)
Sodium: 139 mmol/L (ref 135–145)

## 2019-05-19 LAB — LIPID PANEL
Cholesterol: 236 mg/dL — ABNORMAL HIGH (ref 0–200)
HDL: 52 mg/dL (ref >40)
LDL Cholesterol: 167 mg/dL — ABNORMAL HIGH (ref 0–99)
Total CHOL/HDL Ratio: 4.5 RATIO
Triglycerides: 83 mg/dL (ref <150)
VLDL: 17 mg/dL (ref 0–40)

## 2019-05-19 LAB — CBC
HCT: 42.7 % (ref 36.0–46.0)
Hemoglobin: 13.7 g/dL (ref 12.0–15.0)
MCH: 28.3 pg (ref 26.0–34.0)
MCHC: 32.1 g/dL (ref 30.0–36.0)
MCV: 88.2 fL (ref 80.0–100.0)
Platelets: 176 10*3/uL (ref 150–400)
RBC: 4.84 MIL/uL (ref 3.87–5.11)
RDW: 13.8 % (ref 11.5–15.5)
WBC: 3.7 10*3/uL — ABNORMAL LOW (ref 4.0–10.5)
nRBC: 0 % (ref 0.0–0.2)

## 2019-05-19 LAB — MAGNESIUM: Magnesium: 2 mg/dL (ref 1.7–2.4)

## 2019-05-19 LAB — RPR: RPR Ser Ql: NONREACTIVE

## 2019-05-19 MED ORDER — ASPIRIN 81 MG PO TBEC: 81.0000 mg | DELAYED_RELEASE_TABLET | Freq: Every day | ORAL | 0 refills | Status: DC

## 2019-05-19 MED ORDER — HYDRALAZINE HCL 50 MG PO TABS: 50.0000 mg | ORAL_TABLET | Freq: Three times a day (TID) | ORAL | 0 refills | Status: DC

## 2019-05-19 MED ORDER — POTASSIUM CHLORIDE CRYS ER 20 MEQ PO TBCR
40.0000 meq | EXTENDED_RELEASE_TABLET | Freq: Once | ORAL | Status: AC
  Administered 2019-05-19: 40 meq via ORAL
  Filled 2019-05-19: qty 2

## 2019-05-19 MED ORDER — HYDRALAZINE HCL 50 MG PO TABS: 50.0000 mg | ORAL_TABLET | Freq: Three times a day (TID) | ORAL | Status: DC

## 2019-05-19 NOTE — Evaluation (Signed)
Occupational Therapy Evaluation Patient Details Name: Taylor Watson MRN: [id number] DOB: [birth year] Today's Date: 05/19/2019    History of Present Illness Taylor Watson is a 86 y.o. female with medical history significant of hypertension, hyperlipidemia, stroke, GERD, depression, UTI, who presents with weakness, dizziness, altered mental status.   Clinical Impression   Patient seen for OT evaluation this date.  Patient alert and oriented, patient lives with her daughter in a mobile home. She was modified independent prior to admission with basic self care tasks, able to make herself breakfast.  Her daughter cooks all other meals, cleaning and driving.  Patient reports she does a sponge bath at home.  Patient presents with muscle weakness, decreased balance, requires min guard for functional mobility this date and decreased ability to perform ADL tasks.  She wants to go home and will likely require 24 hour supervision.  She would benefit from continued skilled OT services to maximize her safety and independence with daily tasks.      Follow Up Recommendations  Home health OT    Equipment Recommendations       Recommendations for Other Services       Precautions / Restrictions Precautions Precautions: Fall Restrictions Weight Bearing Restrictions: No      Mobility Bed Mobility Overal bed mobility: Modified Independent             General bed mobility comments: cues to push up from the bed  Transfers Overall transfer level: Needs assistance Equipment used: Rolling walker (2 wheeled)             General transfer comment: VC for safety, min guard for toilet and bed transfer for safety    Balance Overall balance assessment: Needs assistance Sitting-balance support: No upper extremity supported;Feet supported Sitting balance-Leahy Scale: Normal     Standing balance support: Bilateral upper extremity supported Standing balance-Leahy Scale: Fair Standing  balance comment: unable to tandem stand or reach in standing without UE support                           ADL either performed or assessed with clinical judgement   ADL Overall ADL's : Needs assistance/impaired Eating/Feeding: Modified independent   Grooming: Sitting;Standing;Min guard   Upper Body Bathing: Set up   Lower Body Bathing: Set up;Bed level   Upper Body Dressing : Modified independent   Lower Body Dressing: Set up;Min guard   Toilet Transfer: Min guard;Regular Toilet;Grab bars   Toileting- Clothing Manipulation and Hygiene: Min guard               Vision Baseline Vision/History: Wears glasses Wears Glasses: At all times Patient Visual Report: No change from baseline       Perception     Praxis      Pertinent Vitals/Pain Pain Assessment: No/denies pain     Hand Dominance Right   Extremity/Trunk Assessment Upper Extremity Assessment Upper Extremity Assessment: Generalized weakness   Lower Extremity Assessment Lower Extremity Assessment: Defer to PT evaluation       Communication Communication Communication: No difficulties   Cognition Arousal/Alertness: Awake/alert Behavior During Therapy: WFL for tasks assessed/performed Overall Cognitive Status: No family/caregiver present to determine baseline cognitive functioning                                     General Comments       Exercises  Exercises: General Lower Extremity General Exercises - Lower Extremity Long Arc Quad: Right;Left;10 reps Hip Flexion/Marching: Right;Left;10 reps   Shoulder Instructions      Home Living Family/patient expects to be discharged to:: Private residence Living Arrangements: Children Available Help at Discharge: Family Type of Home: Mobile home Home Access: Stairs to enter Entrance Stairs-Number of Steps: 3 Entrance Stairs-Rails: Right Home Layout: One level     Bathroom Shower/Tub: Tub/shower unit;Curtain;Other  (comment)(patient reports she has not hot water in the bathroom, sponge baths)   Bathroom Toilet: Standard     Home Equipment: Walker - 2 wheels          Prior Functioning/Environment Level of Independence: Independent with assistive device(s)        Comments: Patient denies any falls, reports she lives with her daughter but reports she was able to perform her basic self care tasks with modified independence and was able to cook breakfast for herself. She reports her daughter prepares lunch and dinner and does all the cleaning.  She does not drive.        OT Problem List: Decreased strength;Decreased knowledge of use of DME or AE;Decreased range of motion;Decreased activity tolerance;Impaired balance (sitting and/or standing)      OT Treatment/Interventions: Self-care/ADL training;Therapeutic exercise;Patient/family education;Balance training;Therapeutic activities;DME and/or AE instruction    OT Goals(Current goals can be found in the care plan section) Acute Rehab OT Goals Patient Stated Goal: patient wants to go home with her daughter and be independent as possible OT Goal Formulation: With patient Time For Goal Achievement: 06/24/19 Potential to Achieve Goals: Good ADL Goals Pt Will Perform Lower Body Dressing: with modified independence Pt Will Transfer to Toilet: with modified independence  OT Frequency: Min 1X/week   Barriers to D/C:            Co-evaluation              AM-PAC OT "6 Clicks" Daily Activity     Outcome Measure Help from another person eating meals?: None Help from another person taking care of personal grooming?: None Help from another person toileting, which includes using toliet, bedpan, or urinal?: A Little Help from another person bathing (including washing, rinsing, drying)?: A Little Help from another person to put on and taking off regular upper body clothing?: None Help from another person to put on and taking off regular lower body  clothing?: A Little 6 Click Score: 21   End of Session Equipment Utilized During Treatment: Gait belt;Rolling walker  Activity Tolerance: Patient tolerated treatment well Patient left: in bed;with call bell/phone within reach;with bed alarm set  OT Visit Diagnosis: Unsteadiness on feet (R26.81);Muscle weakness (generalized) (M62.81);History of falling (Z91.81)                Time: 9390-3009 OT Time Calculation (min): 26 min Charges:  OT General Charges $OT Visit: 1 Visit OT Treatments $Self Care/Home Management : 8-22 mins  Kassadi Presswood T Anas Reister, OTR/L, CLT   Mildreth Reek 05/19/2019, 11:21 AM

## 2019-05-19 NOTE — Progress Notes (Signed)
Physical Therapy Evaluation Patient Details Name: Taylor Watson MRN: [id number] DOB: [birth year] Today's Date: 05/19/2019   History of Present Illness  Taylor Watson is a 86 y.o. female with medical history significant of hypertension, hyperlipidemia, stroke, GERD, depression, UTI, who presents with weakness, dizziness, altered mental status.  Clinical Impression  Patient presents with decreased gait speed, decreased balance, and decreased BLE strength. Patient's main complaint is BLE weakness and inability to participate in desired activities. Patient wants to improve  ability to ambulate on inndoor surfaces safely. Patient will benefit from skilled PT in order to increase gait speed, increase BLE strength, and improve dynamic standing balance to decrease risk for falls and enable patient to participate in desired activities.    Follow Up Recommendations Home health PT    Equipment Recommendations  Rolling walker with 5" wheels    Recommendations for Other Services       Precautions / Restrictions Precautions Precautions: None Restrictions Weight Bearing Restrictions: No      Mobility  Bed Mobility Overal bed mobility: Modified Independent             General bed mobility comments: vc for sequencing  Transfers Overall transfer level: Modified independent Equipment used: Rolling walker (2 wheeled)             General transfer comment: VC for safety  Ambulation/Gait Ambulation/Gait assistance: Min guard Gait Distance (Feet): 20 Feet Assistive device: Rolling walker (2 wheeled) Gait Pattern/deviations: Step-to pattern     General Gait Details: decreased gait speed  Stairs            Wheelchair Mobility    Modified Rankin (Stroke Patients Only)       Balance Overall balance assessment: (P) Needs assistance                                           Pertinent Vitals/Pain Pain Assessment: No/denies pain    Home  Living Family/patient expects to be discharged to:: Private residence Living Arrangements: Children Available Help at Discharge: Family Type of Home: Mobile home Home Access: Other (comment)(unable to say if she has steps to her home)              Prior Function Level of Independence: Independent with assistive device(s)               Hand Dominance   Dominant Hand: Left    Extremity/Trunk Assessment   Upper Extremity Assessment Upper Extremity Assessment: Overall WFL for tasks assessed    Lower Extremity Assessment Lower Extremity Assessment: Overall WFL for tasks assessed       Communication   Communication: No difficulties  Cognition Arousal/Alertness: Awake/alert Behavior During Therapy: WFL for tasks assessed/performed Overall Cognitive Status: No family/caregiver present to determine baseline cognitive functioning                                        General Comments      Exercises General Exercises - Lower Extremity Long Arc Quad: Right;Left;10 reps Hip Flexion/Marching: Right;Left;10 reps   Assessment/Plan    PT Assessment Patient needs continued PT services  PT Problem List Decreased strength;Decreased activity tolerance;Decreased mobility       PT Treatment Interventions Gait training;Therapeutic exercise    PT Goals (Current goals can be  found in the Care Plan section)  Acute Rehab PT Goals Patient Stated Goal: to go home PT Goal Formulation: Patient unable to participate in goal setting Time For Goal Achievement: 06/02/19 Potential to Achieve Goals: Good    Frequency Min 2X/week   Barriers to discharge        Co-evaluation               AM-PAC PT "6 Clicks" Mobility  Outcome Measure Help needed turning from your back to your side while in a flat bed without using bedrails?: None Help needed moving from lying on your back to sitting on the side of a flat bed without using bedrails?: A Little Help needed  moving to and from a bed to a chair (including a wheelchair)?: A Little Help needed standing up from a chair using your arms (e.g., wheelchair or bedside chair)?: A Little Help needed to walk in hospital room?: A Little Help needed climbing 3-5 steps with a railing? : A Lot 6 Click Score: 18    End of Session Equipment Utilized During Treatment: Gait belt Activity Tolerance: Patient tolerated treatment well Patient left: in bed;with bed alarm set Nurse Communication: Mobility status PT Visit Diagnosis: Unsteadiness on feet (R26.81);Muscle weakness (generalized) (M62.81);Difficulty in walking, not elsewhere classified (R26.2)    Time: 4268-3419 PT Time Calculation (min) (ACUTE ONLY): 25 min   Charges:   PT Evaluation $PT Eval Low Complexity: 1 Low PT Treatments $Gait Training: 8-22 mins          Arelia Sneddon S, PT DPT 05/19/2019, 10:28 AM

## 2019-05-19 NOTE — Plan of Care (Signed)

## 2019-05-19 NOTE — TOC Initial Note (Signed)
Transition of Care [facility]) - Initial/Assessment Note    Patient Details  Name: Taylor Watson MRN: [id number] Date of Birth: 02-14-1919  Transition of Care [facility] CM/SW Contact:    Taylor Watson,  Phone Number: 05/19/2019, 2:22 PM  Clinical Narrative:                 Patient is a 86 y.o. female  who presented to the hospital with weakness, dizziness, altered mental status. Patient resides with her daughter who assists with all of her care needs. Patient to discharge home today with [facility] services. This social worker discussed [place] recommendation with patient. Per patient, she has no preference. [facility] arranged for follow up. Patient discussed having no additional needs at this time.  Phone call to patient's daughter, Taylor Watson [phone]. HH recommendation discussed. [facility] [place] Patient has no DME needs per, daughter. Patient's daughter verbalized having no additional discharge needs.   Mili Piltz, LCSW Clinical Social Work [phone]  Expected Discharge Plan: [place] Barriers to Discharge: No Barriers Identified   Patient Goals and CMS Choice Patient states their goals for this hospitalization and ongoing recovery are:: "I want to go home"      Expected Discharge Plan and Services Expected Discharge Plan: [place] In-house Referral: Clinical Social Work     Living arrangements for the past 2 months: Mobile Home Expected Discharge Date: 05/19/19                         HH Arranged: PT, OT [place] Agency: [place] ([facility])     Representative spoke with at [place]: Corene Cornea  Prior Living Arrangements/Services Living arrangements for the past 2 months: Mobile Home Lives with:: Adult Children Patient language and need for interpreter reviewed:: Yes Do you feel safe going back to the place where you live?: Yes      Need for Family Participation in Patient Care:  Yes (Comment) Care giver support system in place?: Yes (comment) Current home services: Home OT, Home PT Criminal Activity/Legal Involvement Pertinent to Current Situation/Hospitalization: No - Comment as needed  Activities of Daily Living Home Assistive Devices/Equipment: Cane (specify quad or straight), Walker (specify type) ADL Screening (condition at time of admission) Patient's cognitive ability adequate to safely complete daily activities?: Yes Is the patient deaf or have difficulty hearing?: No Does the patient have difficulty seeing, even when wearing glasses/contacts?: No Does the patient have difficulty concentrating, remembering, or making decisions?: Yes Patient able to express need for assistance with ADLs?: Yes Does the patient have difficulty dressing or bathing?: No Independently performs ADLs?: No Communication: Appropriate for developmental age Dressing (OT): Appropriate for developmental age Grooming: Appropriate for developmental age Feeding: Independent Bathing: Appropriate for developmental age 45: Appropriate for developmental age In/Out Bed: Independent with device (comment) Walks in Home: Independent with device (comment) Does the patient have difficulty walking or climbing stairs?: Yes Weakness of Legs: Both Weakness of Arms/Hands: None  Permission Sought/Granted   Permission granted to share information with : Yes, Verbal Permission Granted  Share Information with NAME: Taylor Watson     Permission granted to share info w Relationship: daughter  Permission granted to share info w Contact Information: [phone]  Emotional Assessment Appearance:: Appears older than stated age Attitude/Demeanor/Rapport: Engaged Affect (typically observed): Accepting Orientation: : Oriented to Self, Oriented to Place, Oriented to  Time, Oriented to Situation Alcohol / Substance  Use: Not Applicable Psych Involvement: No (comment)  Admission diagnosis:  Weakness  [R53.1] COPD exacerbation (HCC) [G92.0] Acute metabolic encephalopathy [F00.71] Patient Active Problem List   Diagnosis Date Noted  . Hypertensive urgency 05/18/2019  . Acute metabolic encephalopathy 21/97/5883  . Elevated troponin 05/18/2019  . Hypokalemia 05/18/2019  . URI (upper respiratory infection) 08/26/2018  . Headache 07/17/2017  . SOB (shortness of breath) 03/28/2016  . Near syncope 03/01/2016  . Anemia 10/28/2015  . Lower GI bleed   . Goals of care, counseling/discussion   . Blood loss anemia 06/01/2015  . GERD (gastroesophageal reflux disease)   . Depression   . Unsteady gait 02/08/2015  . Schwannoma 10/07/2013  . Rectal bleeding 04/03/2013  . Light headedness 01/19/2013  . Loss of weight 10/22/2012  . Hypercholesterolemia 10/22/2012  . History of CVA (cerebrovascular accident) 10/22/2012  . Anxiety 10/22/2012  . Essential hypertension, benign 10/22/2012   PCP:  [name], MD Pharmacy:   [place], [place] [address] Phone: [phone] Fax: [phone]  [id number] [place] [place] [facility] [address] [place] [address] Phone: [phone] Fax: [phone]     Social Determinants of Health (SDOH) Interventions    Readmission Risk Interventions No flowsheet data found.

## 2019-05-19 NOTE — Progress Notes (Signed)
Patient discharged to home today and [facility] will be set up for her. DC & Rx instructions given to granddaughter over the phone. Questions answered and granddaughter acknowledged understanding. IV removed, NT helped patient get dressed for transport via daughter who is waiting at the medical mall.

## 2019-05-19 NOTE — Discharge Summary (Signed)
[facility] Discharge Summary   Patient: Taylor Watson ZHG:[id number]  PCP: [name], MD  Date of admission: 05/17/2019   Date of discharge: 05/19/2019      Discharge Diagnoses:  Principal Problem:   Acute metabolic encephalopathy Active Problems:   Depression   Hypertensive urgency   Elevated troponin   Hypokalemia  Admitted From: Home Disposition:  Home with home health  Recommendations for Outpatient Follow-up:  1. PCP: Follow-up with PCP in 1 week 2. Follow up LABS/TEST: None   Diet recommendation: Cardiac diet  Activity: The patient is advised to gradually reintroduce usual activities, as tolerated  Discharge Condition: stable  Code Status: Full code   History of present illness: As per the H and P dictated on admission, "Taylor Watson is a 86 y.o. female with medical history significant of hypertension, hyperlipidemia, stroke, GERD, depression, UTI, who presents with weakness, dizziness, altered mental status.  Per his grandson (I called his grandson via phone), normally patient is alert and able to talk with family members most of the time, but recently she seems to be more confused than baseline. She has "falling asleep abruptly without warning, feelings of dizziness, increasing forgetfulness". Pt daughter reported to EDP that her symptoms have been occurring for "years", however that they have worsened over the past week as the patient buried her daughter. Pt has nausea, but no vomiting, diarrhea.  He has some mild cough, no shortness breath or chest pain.  No fever or chills.  Does not seem to have abdominal pain or chest pain.  Her grandson states that the patient has chronic bilateral leg weakness, but on my exam physical examination, her right leg is weaker than the left. No facial droop or slurred speech. "  Hospital Course:  Summary of her active problems in the hospital is as following. Acute metabolic encephalopathy: Etiology is not clear.   CT head is negative.  On physical examination, patient has right leg weakness, but MRI of the brain is negative for stroke. Mentation significantly better and almost back to baseline. PT OT recommends home health. Will require repeat B12 check in 1 month  Depression: -continue home meds   Hypertensive urgency:  Blood pressure 202/90.  Patient is not taking blood pressure medications at home. -Start hydralazine 50 3 times daily  Elevated troponin: trop 19 -->23 -->25. No CP.  Likely due to demand ischemia -ASA  Hypokalemia: K=2.9 on admission. - Repleted  Patient was seen by physical therapy, who recommended Home health, which was arranged. On the day of the discharge the patient's vitals were stable, and no other acute medical condition were reported by patient. the patient was felt safe to be discharge at Home with Home health.  Consultants: none Procedures: none  Discharge Exam: General: Appear in no distress, no Rash; Oral Mucosa Clear, moist. Cardiovascular: S1 and S2 Present, no Murmur, Respiratory: normal respiratory effort, Bilateral Air entry present and no Crackles, no wheezes Abdomen: Bowel Sound present, Soft and no tenderness, no hernia Extremities: no Pedal edema, no calf tenderness Neurology: alert and oriented to time, place, and person affect appropriate.  Filed Weights   05/17/19 1635  Weight: 52.2 kg   Vitals:   05/19/19 0116 05/19/19 0758  BP: (!) 170/79 (!) 173/81  Pulse: 73 79  Resp: 16 16  Temp: 98.4 F (36.9 C) 98 F (36.7 C)  SpO2: 93% 93%    DISCHARGE MEDICATION: Allergies as of 05/19/2019      Reactions   Dyazide [  hydrochlorothiazide W-triamterene]    Monopril [fosinopril]    Toprol Xl [metoprolol Tartrate]    Verapamil       Medication List    TAKE these medications   aspirin 81 MG EC tablet Take 1 tablet (81 mg total) by mouth daily. Start taking on: May 20, 2019   escitalopram 10 MG tablet Commonly known as:  LEXAPRO Take 0.5 tablets (5 mg total) by mouth daily.   hydrALAZINE 50 MG tablet Commonly known as: APRESOLINE Take 1 tablet (50 mg total) by mouth 3 (three) times daily.   omeprazole 20 MG capsule Commonly known as: PRILOSEC Take 1 capsule (20 mg total) by mouth daily.   timolol 0.25 % ophthalmic solution Commonly known as: BETIMOL 1-2 drops 2 (two) times daily.      Allergies  Allergen Reactions  . Dyazide [Hydrochlorothiazide W-Triamterene]   . Monopril [Fosinopril]   . Toprol Xl [Metoprolol Tartrate]   . Verapamil    Discharge Instructions    Diet - low sodium heart healthy   Complete by: As directed    Discharge instructions   Complete by: As directed    It is important that you read the given instructions as well as go over your medication list with RN to help you understand your care after this hospitalization.  Please follow-up with PCP in 1-2 weeks.  Please note that NO REFILLS for any discharge medications will be authorized once you are discharged, as it is imperative that you return to your primary care physician (or establish a relationship with a primary care physician if you do not have one) for your aftercare needs so that they can reassess your need for medications and monitor your lab values.  Please request your primary care physician to go over all Hospital Tests and Procedure/Radiological results at the follow up. Please get all Hospital records sent to your PCP by signing hospital release before you go home.   Do not take more than prescribed Pain, Sleep and Anxiety Medications.  You were cared for by a hospitalist during your hospital stay. If you have any questions about your discharge medications or the care you received while you were in the hospital after you are discharged, you can call the unit @UNIT @ you were admitted to and ask to speak with the hospitalist Berle Mull. Ask for Hospitalist on call if the hospitalist that took care of you is not  available.   Once you are discharged, your primary care physician will handle any further medical issues.  You Must read complete instructions/literature along with all the possible adverse reactions/side effects for all the Medicines you take and that have been prescribed to you. Take any new Medicines after you have completely understood and accept all the possible adverse reactions/side effects.  If you have smoked or chewed Tobacco in the last 2 yrs please STOP smoking STOP any Recreational drug use.  Wear Seat belts while driving.   Increase activity slowly   Complete by: As directed       The results of significant diagnostics from this hospitalization (including imaging, microbiology, ancillary and laboratory) are listed below for reference.    Significant Diagnostic Studies: DG Chest 2 View  Result Date: 05/17/2019 CLINICAL DATA:  Initial evaluation for acute hypoxia. EXAM: CHEST - 2 VIEW COMPARISON:  Prior radiograph from 07/14/2016. FINDINGS: Cardiac and mediastinal silhouettes are stable, and remain within normal limits. Tortuosity the intrathoracic aorta with associated aortic atherosclerosis noted. Lungs are hyperinflated with chronic coarsening of  the interstitial markings, compatible with emphysema. No focal infiltrates. No pulmonary edema or pleural effusion. No pneumothorax. No acute osseous abnormality. No made of a ovoid soft tissue opacity at the right supraclavicular fossa, likely reflecting previously identified right supraclavicular lesion. This is stable from previous. IMPRESSION: 1. Emphysema.  No other active cardiopulmonary disease. 2.  Aortic Atherosclerosis (ICD10-I70.0). 3. Ovoid opacity at the right supraclavicular fossa, consistent with previously identified right supraclavicular lesion. Finding is stable from previous. Electronically Signed   By: Jeannine Boga M.D.   On: 05/17/2019 23:55   CT Head Wo Contrast  Result Date: 05/18/2019 CLINICAL DATA:   Altered mental status EXAM: CT HEAD WITHOUT CONTRAST TECHNIQUE: Contiguous axial images were obtained from the base of the skull through the vertex without intravenous contrast. COMPARISON:  Sep 10, 2013 FINDINGS: Brain: No evidence of acute territorial infarction, hemorrhage, hydrocephalus,extra-axial collection or mass lesion/mass effect. There is dilatation the ventricles and sulci consistent with age-related atrophy. Low-attenuation changes in the deep white matter consistent with small vessel ischemia. Vascular: No hyperdense vessel or unexpected calcification. Skull: The skull is intact. No fracture seen. Again noted is decreased density seen within the right parietotemporal skull. This is slightly progressed since the MRI. No cortical destruction or periosteal reaction is seen within this region. Sinuses/Orbits: The visualized paranasal sinuses and mastoid air cells are clear. The orbits and globes intact. Other: None IMPRESSION: No acute intracranial abnormality. Findings consistent with age related atrophy and chronic small vessel ischemia Decreased density within the right parietotemporal skull which is non-specific, slightly progressed the prior exam. Electronically Signed   By: Prudencio Pair M.D.   On: 05/18/2019 02:37   MR BRAIN WO CONTRAST  Result Date: 05/18/2019 CLINICAL DATA:  Right leg weakness.  Falling. EXAM: MRI HEAD WITHOUT CONTRAST TECHNIQUE: Multiplanar, multiecho pulse sequences of the brain and surrounding structures were obtained without intravenous contrast. COMPARISON:  Head CT same day.  MRI 09/10/2013. FINDINGS: Brain: Diffusion imaging does not show any acute or subacute infarction. No focal abnormality affects the brainstem or cerebellum. Cerebral hemispheres show moderate to marked changes of chronic small vessel disease throughout the deep and subcortical white matter. No large vessel territory infarction. No mass lesion, hemorrhage, hydrocephalus or extra-axial collection.  Vascular: Major vessels at the base of the brain show flow. Skull and upper cervical spine: Geographic calvarial lucency by CT is secondary to osteoporosis circumscripta type Paget's disease. Sinuses/Orbits: Negative Other: None IMPRESSION: No acute finding. Moderate to marked chronic small-vessel ischemic changes of the cerebral hemispheric white matter. Geographic lucency of the calvarium on the right at CT indicates osteoporosis circumscripta type Paget's disease. Electronically Signed   By: Nelson Chimes M.D.   On: 05/18/2019 14:52    Microbiology: Recent Results (from the past 240 hour(s))  SARS CORONAVIRUS 2 (TAT 6-24 HRS) Nasopharyngeal Nasopharyngeal Swab     Status: None   Collection Time: 05/18/19 12:12 PM   Specimen: Nasopharyngeal Swab  Result Value Ref Range Status   SARS Coronavirus 2 NEGATIVE NEGATIVE Final    Comment: (NOTE) SARS-CoV-2 target nucleic acids are NOT DETECTED. The SARS-CoV-2 RNA is generally detectable in upper and lower respiratory specimens during the acute phase of infection. Negative results do not preclude SARS-CoV-2 infection, do not rule out co-infections with other pathogens, and should not be used as the sole basis for treatment or other patient management decisions. Negative results must be combined with clinical observations, patient history, and epidemiological information. The expected result is Negative. Fact Sheet for  Patients: SugarRoll.be Fact Sheet for Healthcare Providers: https://www.woods-mathews.com/ This test is not yet approved or cleared by the [place] FDA and  has been authorized for detection and/or diagnosis of SARS-CoV-2 by FDA under an Emergency Use Authorization (EUA). This EUA will remain  in effect (meaning this test can be used) for the duration of the COVID-19 declaration under Section 56 4(b)(1) of the Act, 21 U.S.C. section 360bbb-3(b)(1), unless the authorization is terminated  or revoked sooner. Performed at [facility], [place] [address], [place], [place] [address]      Labs: CBC: Recent Labs  Lab 05/17/19 1649 05/19/19 0541  WBC 3.9* 3.7*  HGB 13.5 13.7  HCT 43.4 42.7  MCV 91.2 88.2  PLT 180 675   Basic Metabolic Panel: Recent Labs  Lab 05/17/19 1649 05/19/19 0541  NA 139 139  K 2.9* 3.1*  CL 100 99  CO2 30 31  GLUCOSE 110* 83  BUN 16 14  CREATININE 0.80 0.81  CALCIUM 9.4 9.0  MG  --  2.0   Liver Function Tests: No results for input(s): AST, ALT, ALKPHOS, BILITOT, PROT, ALBUMIN in the last 168 hours. No results for input(s): LIPASE, AMYLASE in the last 168 hours. No results for input(s): AMMONIA in the last 168 hours. Cardiac Enzymes: No results for input(s): CKTOTAL, CKMB, CKMBINDEX, TROPONINI in the last 168 hours. BNP (last 3 results) No results for input(s): BNP in the last 8760 hours. CBG: No results for input(s): GLUCAP in the last 168 hours.  Time spent: 35 minutes  Signed:  Berle Mull  [facility] 05/19/2019 9:08 PM

## 2019-05-20 LAB — HEMOGLOBIN A1C
Hgb A1c MFr Bld: 5.6 % (ref 4.8–5.6)
Mean Plasma Glucose: 114.02 mg/dL

## 2019-05-21 ENCOUNTER — Telehealth: Payer: Self-pay

## 2019-05-21 NOTE — Telephone Encounter (Signed)
Called patient for transitional care management follow up. Spoke with patient and daughter, both parties states patient is doing well and declines hospital follow up at this time. Encouraged to call her PMD or contact Urgent care/ED if symptoms present or worsen.

## 2019-05-23 ENCOUNTER — Telehealth: Payer: Self-pay | Admitting: Internal Medicine

## 2019-05-23 DIAGNOSIS — I10 Essential (primary) hypertension: Secondary | ICD-10-CM | POA: Diagnosis not present

## 2019-05-23 DIAGNOSIS — E7800 Pure hypercholesterolemia, unspecified: Secondary | ICD-10-CM | POA: Diagnosis not present

## 2019-05-23 DIAGNOSIS — Z7982 Long term (current) use of aspirin: Secondary | ICD-10-CM | POA: Diagnosis not present

## 2019-05-23 DIAGNOSIS — K219 Gastro-esophageal reflux disease without esophagitis: Secondary | ICD-10-CM | POA: Diagnosis not present

## 2019-05-23 DIAGNOSIS — D649 Anemia, unspecified: Secondary | ICD-10-CM | POA: Diagnosis not present

## 2019-05-23 DIAGNOSIS — Z87891 Personal history of nicotine dependence: Secondary | ICD-10-CM | POA: Diagnosis not present

## 2019-05-23 DIAGNOSIS — Z8673 Personal history of transient ischemic attack (TIA), and cerebral infarction without residual deficits: Secondary | ICD-10-CM | POA: Diagnosis not present

## 2019-05-23 DIAGNOSIS — E785 Hyperlipidemia, unspecified: Secondary | ICD-10-CM | POA: Diagnosis not present

## 2019-05-23 DIAGNOSIS — G9341 Metabolic encephalopathy: Secondary | ICD-10-CM | POA: Diagnosis not present

## 2019-05-23 DIAGNOSIS — Z87440 Personal history of urinary (tract) infections: Secondary | ICD-10-CM | POA: Diagnosis not present

## 2019-05-23 DIAGNOSIS — Z87442 Personal history of urinary calculi: Secondary | ICD-10-CM | POA: Diagnosis not present

## 2019-05-23 DIAGNOSIS — E876 Hypokalemia: Secondary | ICD-10-CM | POA: Diagnosis not present

## 2019-05-23 NOTE — Telephone Encounter (Signed)
Verbals given  

## 2019-05-23 NOTE — Telephone Encounter (Signed)
Stacy with [facility] called needing verbal order for PT  2 week 3 times a week Ok to leave a message with Verbal order @ [phone]

## 2019-05-24 DIAGNOSIS — E876 Hypokalemia: Secondary | ICD-10-CM | POA: Diagnosis not present

## 2019-05-24 DIAGNOSIS — D649 Anemia, unspecified: Secondary | ICD-10-CM | POA: Diagnosis not present

## 2019-05-24 DIAGNOSIS — Z87891 Personal history of nicotine dependence: Secondary | ICD-10-CM | POA: Diagnosis not present

## 2019-05-24 DIAGNOSIS — K219 Gastro-esophageal reflux disease without esophagitis: Secondary | ICD-10-CM | POA: Diagnosis not present

## 2019-05-24 DIAGNOSIS — E785 Hyperlipidemia, unspecified: Secondary | ICD-10-CM | POA: Diagnosis not present

## 2019-05-24 DIAGNOSIS — Z87442 Personal history of urinary calculi: Secondary | ICD-10-CM | POA: Diagnosis not present

## 2019-05-24 DIAGNOSIS — Z7982 Long term (current) use of aspirin: Secondary | ICD-10-CM | POA: Diagnosis not present

## 2019-05-24 DIAGNOSIS — Z8673 Personal history of transient ischemic attack (TIA), and cerebral infarction without residual deficits: Secondary | ICD-10-CM | POA: Diagnosis not present

## 2019-05-24 DIAGNOSIS — I10 Essential (primary) hypertension: Secondary | ICD-10-CM | POA: Diagnosis not present

## 2019-05-24 DIAGNOSIS — Z87440 Personal history of urinary (tract) infections: Secondary | ICD-10-CM | POA: Diagnosis not present

## 2019-05-24 DIAGNOSIS — G9341 Metabolic encephalopathy: Secondary | ICD-10-CM | POA: Diagnosis not present

## 2019-05-24 DIAGNOSIS — E7800 Pure hypercholesterolemia, unspecified: Secondary | ICD-10-CM | POA: Diagnosis not present

## 2019-05-25 ENCOUNTER — Telehealth: Payer: Self-pay | Admitting: Internal Medicine

## 2019-05-25 NOTE — Telephone Encounter (Signed)
Taylor Watson is requested verbal orders for OT. 1wk one-2wk two

## 2019-05-25 NOTE — Telephone Encounter (Signed)
Verbals given  

## 2019-05-31 DIAGNOSIS — K219 Gastro-esophageal reflux disease without esophagitis: Secondary | ICD-10-CM | POA: Diagnosis not present

## 2019-05-31 DIAGNOSIS — G9341 Metabolic encephalopathy: Secondary | ICD-10-CM | POA: Diagnosis not present

## 2019-05-31 DIAGNOSIS — Z8673 Personal history of transient ischemic attack (TIA), and cerebral infarction without residual deficits: Secondary | ICD-10-CM | POA: Diagnosis not present

## 2019-05-31 DIAGNOSIS — E7800 Pure hypercholesterolemia, unspecified: Secondary | ICD-10-CM | POA: Diagnosis not present

## 2019-05-31 DIAGNOSIS — E876 Hypokalemia: Secondary | ICD-10-CM | POA: Diagnosis not present

## 2019-05-31 DIAGNOSIS — Z87891 Personal history of nicotine dependence: Secondary | ICD-10-CM | POA: Diagnosis not present

## 2019-05-31 DIAGNOSIS — Z7982 Long term (current) use of aspirin: Secondary | ICD-10-CM | POA: Diagnosis not present

## 2019-05-31 DIAGNOSIS — Z87440 Personal history of urinary (tract) infections: Secondary | ICD-10-CM | POA: Diagnosis not present

## 2019-05-31 DIAGNOSIS — D649 Anemia, unspecified: Secondary | ICD-10-CM | POA: Diagnosis not present

## 2019-05-31 DIAGNOSIS — E785 Hyperlipidemia, unspecified: Secondary | ICD-10-CM | POA: Diagnosis not present

## 2019-05-31 DIAGNOSIS — I10 Essential (primary) hypertension: Secondary | ICD-10-CM | POA: Diagnosis not present

## 2019-05-31 DIAGNOSIS — Z87442 Personal history of urinary calculi: Secondary | ICD-10-CM | POA: Diagnosis not present

## 2019-06-05 DIAGNOSIS — K219 Gastro-esophageal reflux disease without esophagitis: Secondary | ICD-10-CM | POA: Diagnosis not present

## 2019-06-05 DIAGNOSIS — G9341 Metabolic encephalopathy: Secondary | ICD-10-CM | POA: Diagnosis not present

## 2019-06-05 DIAGNOSIS — Z87440 Personal history of urinary (tract) infections: Secondary | ICD-10-CM | POA: Diagnosis not present

## 2019-06-05 DIAGNOSIS — Z87891 Personal history of nicotine dependence: Secondary | ICD-10-CM | POA: Diagnosis not present

## 2019-06-05 DIAGNOSIS — Z7982 Long term (current) use of aspirin: Secondary | ICD-10-CM | POA: Diagnosis not present

## 2019-06-05 DIAGNOSIS — E876 Hypokalemia: Secondary | ICD-10-CM | POA: Diagnosis not present

## 2019-06-05 DIAGNOSIS — I10 Essential (primary) hypertension: Secondary | ICD-10-CM | POA: Diagnosis not present

## 2019-06-05 DIAGNOSIS — Z87442 Personal history of urinary calculi: Secondary | ICD-10-CM | POA: Diagnosis not present

## 2019-06-05 DIAGNOSIS — Z8673 Personal history of transient ischemic attack (TIA), and cerebral infarction without residual deficits: Secondary | ICD-10-CM | POA: Diagnosis not present

## 2019-06-05 DIAGNOSIS — D649 Anemia, unspecified: Secondary | ICD-10-CM | POA: Diagnosis not present

## 2019-06-05 DIAGNOSIS — E785 Hyperlipidemia, unspecified: Secondary | ICD-10-CM | POA: Diagnosis not present

## 2019-06-05 DIAGNOSIS — E7800 Pure hypercholesterolemia, unspecified: Secondary | ICD-10-CM | POA: Diagnosis not present

## 2019-06-08 DIAGNOSIS — Z8673 Personal history of transient ischemic attack (TIA), and cerebral infarction without residual deficits: Secondary | ICD-10-CM | POA: Diagnosis not present

## 2019-06-08 DIAGNOSIS — Z87891 Personal history of nicotine dependence: Secondary | ICD-10-CM | POA: Diagnosis not present

## 2019-06-08 DIAGNOSIS — E7800 Pure hypercholesterolemia, unspecified: Secondary | ICD-10-CM | POA: Diagnosis not present

## 2019-06-08 DIAGNOSIS — D649 Anemia, unspecified: Secondary | ICD-10-CM | POA: Diagnosis not present

## 2019-06-08 DIAGNOSIS — I10 Essential (primary) hypertension: Secondary | ICD-10-CM | POA: Diagnosis not present

## 2019-06-08 DIAGNOSIS — Z87440 Personal history of urinary (tract) infections: Secondary | ICD-10-CM | POA: Diagnosis not present

## 2019-06-08 DIAGNOSIS — E876 Hypokalemia: Secondary | ICD-10-CM | POA: Diagnosis not present

## 2019-06-08 DIAGNOSIS — K219 Gastro-esophageal reflux disease without esophagitis: Secondary | ICD-10-CM | POA: Diagnosis not present

## 2019-06-08 DIAGNOSIS — Z87442 Personal history of urinary calculi: Secondary | ICD-10-CM | POA: Diagnosis not present

## 2019-06-08 DIAGNOSIS — G9341 Metabolic encephalopathy: Secondary | ICD-10-CM | POA: Diagnosis not present

## 2019-06-08 DIAGNOSIS — Z7982 Long term (current) use of aspirin: Secondary | ICD-10-CM | POA: Diagnosis not present

## 2019-06-08 DIAGNOSIS — E785 Hyperlipidemia, unspecified: Secondary | ICD-10-CM | POA: Diagnosis not present

## 2019-06-11 DIAGNOSIS — E7800 Pure hypercholesterolemia, unspecified: Secondary | ICD-10-CM | POA: Diagnosis not present

## 2019-06-11 DIAGNOSIS — K219 Gastro-esophageal reflux disease without esophagitis: Secondary | ICD-10-CM | POA: Diagnosis not present

## 2019-06-11 DIAGNOSIS — E876 Hypokalemia: Secondary | ICD-10-CM | POA: Diagnosis not present

## 2019-06-11 DIAGNOSIS — Z87891 Personal history of nicotine dependence: Secondary | ICD-10-CM | POA: Diagnosis not present

## 2019-06-11 DIAGNOSIS — E785 Hyperlipidemia, unspecified: Secondary | ICD-10-CM | POA: Diagnosis not present

## 2019-06-11 DIAGNOSIS — G9341 Metabolic encephalopathy: Secondary | ICD-10-CM | POA: Diagnosis not present

## 2019-06-11 DIAGNOSIS — D649 Anemia, unspecified: Secondary | ICD-10-CM | POA: Diagnosis not present

## 2019-06-11 DIAGNOSIS — Z8673 Personal history of transient ischemic attack (TIA), and cerebral infarction without residual deficits: Secondary | ICD-10-CM | POA: Diagnosis not present

## 2019-06-11 DIAGNOSIS — I10 Essential (primary) hypertension: Secondary | ICD-10-CM | POA: Diagnosis not present

## 2019-06-11 DIAGNOSIS — Z87440 Personal history of urinary (tract) infections: Secondary | ICD-10-CM | POA: Diagnosis not present

## 2019-06-11 DIAGNOSIS — Z87442 Personal history of urinary calculi: Secondary | ICD-10-CM | POA: Diagnosis not present

## 2019-06-11 DIAGNOSIS — Z7982 Long term (current) use of aspirin: Secondary | ICD-10-CM | POA: Diagnosis not present

## 2019-06-12 ENCOUNTER — Telehealth: Payer: Self-pay | Admitting: Internal Medicine

## 2019-06-12 DIAGNOSIS — Z87442 Personal history of urinary calculi: Secondary | ICD-10-CM | POA: Diagnosis not present

## 2019-06-12 DIAGNOSIS — Z8673 Personal history of transient ischemic attack (TIA), and cerebral infarction without residual deficits: Secondary | ICD-10-CM | POA: Diagnosis not present

## 2019-06-12 DIAGNOSIS — E7800 Pure hypercholesterolemia, unspecified: Secondary | ICD-10-CM | POA: Diagnosis not present

## 2019-06-12 DIAGNOSIS — E785 Hyperlipidemia, unspecified: Secondary | ICD-10-CM | POA: Diagnosis not present

## 2019-06-12 DIAGNOSIS — Z7982 Long term (current) use of aspirin: Secondary | ICD-10-CM | POA: Diagnosis not present

## 2019-06-12 DIAGNOSIS — K219 Gastro-esophageal reflux disease without esophagitis: Secondary | ICD-10-CM | POA: Diagnosis not present

## 2019-06-12 DIAGNOSIS — D649 Anemia, unspecified: Secondary | ICD-10-CM | POA: Diagnosis not present

## 2019-06-12 DIAGNOSIS — G9341 Metabolic encephalopathy: Secondary | ICD-10-CM | POA: Diagnosis not present

## 2019-06-12 DIAGNOSIS — E876 Hypokalemia: Secondary | ICD-10-CM | POA: Diagnosis not present

## 2019-06-12 DIAGNOSIS — Z87440 Personal history of urinary (tract) infections: Secondary | ICD-10-CM | POA: Diagnosis not present

## 2019-06-12 DIAGNOSIS — Z87891 Personal history of nicotine dependence: Secondary | ICD-10-CM | POA: Diagnosis not present

## 2019-06-12 DIAGNOSIS — I10 Essential (primary) hypertension: Secondary | ICD-10-CM | POA: Diagnosis not present

## 2019-06-12 DIAGNOSIS — R531 Weakness: Secondary | ICD-10-CM

## 2019-06-12 NOTE — Telephone Encounter (Signed)
Taylor Watson is calling wanting Larena Glassman to call her back

## 2019-06-13 NOTE — Telephone Encounter (Signed)
DME printed for signature 

## 2019-06-13 NOTE — Telephone Encounter (Signed)
[name] called to let me know that patient has been d/c'd but needs a prescription for 3 in 1 commode and a tub bench. I will print DME for Dr Nicki Reaper to sign tomorrow and have the family come pick up.

## 2019-06-13 NOTE — Addendum Note (Signed)
Addended by: Lars Masson on: 06/13/2019 11:46 AM   Modules accepted: Orders

## 2019-06-14 DIAGNOSIS — E785 Hyperlipidemia, unspecified: Secondary | ICD-10-CM | POA: Diagnosis not present

## 2019-06-14 DIAGNOSIS — Z87891 Personal history of nicotine dependence: Secondary | ICD-10-CM | POA: Diagnosis not present

## 2019-06-14 DIAGNOSIS — Z7982 Long term (current) use of aspirin: Secondary | ICD-10-CM | POA: Diagnosis not present

## 2019-06-14 DIAGNOSIS — Z87440 Personal history of urinary (tract) infections: Secondary | ICD-10-CM | POA: Diagnosis not present

## 2019-06-14 DIAGNOSIS — E7800 Pure hypercholesterolemia, unspecified: Secondary | ICD-10-CM | POA: Diagnosis not present

## 2019-06-14 DIAGNOSIS — G9341 Metabolic encephalopathy: Secondary | ICD-10-CM | POA: Diagnosis not present

## 2019-06-14 DIAGNOSIS — I10 Essential (primary) hypertension: Secondary | ICD-10-CM | POA: Diagnosis not present

## 2019-06-14 DIAGNOSIS — E876 Hypokalemia: Secondary | ICD-10-CM | POA: Diagnosis not present

## 2019-06-14 DIAGNOSIS — Z8673 Personal history of transient ischemic attack (TIA), and cerebral infarction without residual deficits: Secondary | ICD-10-CM | POA: Diagnosis not present

## 2019-06-14 DIAGNOSIS — D649 Anemia, unspecified: Secondary | ICD-10-CM | POA: Diagnosis not present

## 2019-06-14 DIAGNOSIS — Z87442 Personal history of urinary calculi: Secondary | ICD-10-CM | POA: Diagnosis not present

## 2019-06-14 DIAGNOSIS — K219 Gastro-esophageal reflux disease without esophagitis: Secondary | ICD-10-CM | POA: Diagnosis not present

## 2019-06-18 ENCOUNTER — Telehealth: Payer: Self-pay | Admitting: Internal Medicine

## 2019-06-18 DIAGNOSIS — E7800 Pure hypercholesterolemia, unspecified: Secondary | ICD-10-CM | POA: Diagnosis not present

## 2019-06-18 DIAGNOSIS — R0602 Shortness of breath: Secondary | ICD-10-CM | POA: Diagnosis not present

## 2019-06-18 DIAGNOSIS — I10 Essential (primary) hypertension: Secondary | ICD-10-CM | POA: Diagnosis not present

## 2019-06-18 NOTE — Telephone Encounter (Signed)
Pt needs refills on escitalopram (LEXAPRO) 10 MG tablet and hydrALAZINE (APRESOLINE) 50 MG tablet and timolol (BETIMOL) 0.25 % ophthalmic solution?

## 2019-06-19 MED ORDER — ESCITALOPRAM OXALATE 10 MG PO TABS: 5.0000 mg | ORAL_TABLET | Freq: Every day | ORAL | 1 refills | Status: DC

## 2019-06-19 MED ORDER — HYDRALAZINE HCL 50 MG PO TABS: 50.0000 mg | ORAL_TABLET | Freq: Three times a day (TID) | ORAL | 1 refills | Status: DC

## 2019-06-19 NOTE — Telephone Encounter (Signed)
Ester called to check if the rx had been sent to the Pharmacy

## 2019-06-19 NOTE — Telephone Encounter (Signed)
I have refilled the hydralazine and lexapro.  The betimol - is eye drop and will need to be prescribed by eye doctor.  Does need to call them to get refilled.  Do not want her to be off the medication.

## 2019-06-19 NOTE — Telephone Encounter (Signed)
Patient was given hydralazine in the hospital. Are you okay with sending in for her? Did TCM with Denisa and declined HFU

## 2019-06-20 ENCOUNTER — Telehealth: Payer: Self-pay | Admitting: Internal Medicine

## 2019-06-20 NOTE — Telephone Encounter (Signed)
Patients family is aware and will call eye doctor to get eye drops.

## 2019-06-20 NOTE — Telephone Encounter (Signed)
Small portable oxygen tank to carry around. They said they requested over a year ago and nurse never called him back. Pt's son is looking for a call back.

## 2019-06-21 NOTE — Telephone Encounter (Signed)
Called pt  Unable to leave message

## 2019-06-25 NOTE — Telephone Encounter (Signed)
Called patients daughter to confirm what is needed. Patient has a concentrator and portable tanks. Daughter was requesting the small tanks that she can carry around. Advised that insurance may not cover those for her but I would call [facility] and see if there if some type of smaller tank or bag that she can put her tank in to transport around with her.

## 2019-07-05 DIAGNOSIS — I10 Essential (primary) hypertension: Secondary | ICD-10-CM | POA: Diagnosis not present

## 2019-07-05 NOTE — Telephone Encounter (Signed)
Called Magda Paganini at [facility]. Portable bags will be delivered to pt.

## 2019-07-11 ENCOUNTER — Telehealth: Payer: Self-pay | Admitting: Internal Medicine

## 2019-07-11 NOTE — Telephone Encounter (Signed)
Pt has not been seen since 04/2019 and does not have upcoming appt. Declined HFU. Are you ok to order home health to come out and evaluate or would you prefer to do a visit with her first?

## 2019-07-11 NOTE — Telephone Encounter (Signed)
We may need to have documentation for home health.  I do not mind ordering, but will need to confirm if I need to see to order and have covered.

## 2019-07-11 NOTE — Telephone Encounter (Signed)
Pt's grandson called and states that social services advised him to call PCP about getting home health to come help take care of pt in the mornings a few times a week. Please call him back [phone]

## 2019-07-12 ENCOUNTER — Other Ambulatory Visit: Payer: Self-pay | Admitting: Internal Medicine

## 2019-07-13 ENCOUNTER — Telehealth: Payer: Self-pay | Admitting: Internal Medicine

## 2019-07-13 NOTE — Telephone Encounter (Signed)
Pt's grandson called back about home health. Please call him at [phone]

## 2019-07-13 NOTE — Telephone Encounter (Signed)
Pt scheduled for video visit to have documentation to order home health

## 2019-07-13 NOTE — Telephone Encounter (Signed)
err

## 2019-07-16 DIAGNOSIS — I10 Essential (primary) hypertension: Secondary | ICD-10-CM | POA: Diagnosis not present

## 2019-07-16 DIAGNOSIS — E7800 Pure hypercholesterolemia, unspecified: Secondary | ICD-10-CM | POA: Diagnosis not present

## 2019-07-16 DIAGNOSIS — R0602 Shortness of breath: Secondary | ICD-10-CM | POA: Diagnosis not present

## 2019-07-19 ENCOUNTER — Inpatient Hospital Stay
Admission: EM | Admit: 2019-07-19 | Discharge: 2019-07-25 | DRG: 291 | Disposition: A | Discharge status: Skilled Nursing Facility | Payer: Medicare Other | Attending: Family Medicine | Admitting: Family Medicine

## 2019-07-19 ENCOUNTER — Other Ambulatory Visit: Payer: Self-pay

## 2019-07-19 ENCOUNTER — Emergency Department: Payer: Medicare Other

## 2019-07-19 ENCOUNTER — Telehealth: Payer: Medicare Other | Admitting: Internal Medicine

## 2019-07-19 ENCOUNTER — Inpatient Hospital Stay
Admit: 2019-07-19 | Discharge: 2019-07-19 | Disposition: A | Discharge status: Home / Self Care | Payer: Medicare Other | Attending: Hospitalist | Admitting: Hospitalist

## 2019-07-19 DIAGNOSIS — R531 Weakness: Secondary | ICD-10-CM | POA: Diagnosis present

## 2019-07-19 DIAGNOSIS — I4891 Unspecified atrial fibrillation: Secondary | ICD-10-CM | POA: Diagnosis not present

## 2019-07-19 DIAGNOSIS — F329 Major depressive disorder, single episode, unspecified: Secondary | ICD-10-CM | POA: Diagnosis present

## 2019-07-19 DIAGNOSIS — Z20822 Contact with and (suspected) exposure to covid-19: Secondary | ICD-10-CM | POA: Diagnosis present

## 2019-07-19 DIAGNOSIS — Z8673 Personal history of transient ischemic attack (TIA), and cerebral infarction without residual deficits: Secondary | ICD-10-CM | POA: Diagnosis not present

## 2019-07-19 DIAGNOSIS — I509 Heart failure, unspecified: Secondary | ICD-10-CM | POA: Diagnosis not present

## 2019-07-19 DIAGNOSIS — E7800 Pure hypercholesterolemia, unspecified: Secondary | ICD-10-CM | POA: Diagnosis not present

## 2019-07-19 DIAGNOSIS — Z7982 Long term (current) use of aspirin: Secondary | ICD-10-CM

## 2019-07-19 DIAGNOSIS — E8779 Other fluid overload: Secondary | ICD-10-CM | POA: Diagnosis not present

## 2019-07-19 DIAGNOSIS — I480 Paroxysmal atrial fibrillation: Secondary | ICD-10-CM | POA: Diagnosis present

## 2019-07-19 DIAGNOSIS — I10 Essential (primary) hypertension: Secondary | ICD-10-CM | POA: Diagnosis not present

## 2019-07-19 DIAGNOSIS — Z888 Allergy status to other drugs, medicaments and biological substances status: Secondary | ICD-10-CM

## 2019-07-19 DIAGNOSIS — E8770 Fluid overload, unspecified: Secondary | ICD-10-CM | POA: Diagnosis not present

## 2019-07-19 DIAGNOSIS — R0602 Shortness of breath: Secondary | ICD-10-CM | POA: Diagnosis not present

## 2019-07-19 DIAGNOSIS — W19XXXA Unspecified fall, initial encounter: Secondary | ICD-10-CM

## 2019-07-19 DIAGNOSIS — Z79899 Other long term (current) drug therapy: Secondary | ICD-10-CM | POA: Diagnosis not present

## 2019-07-19 DIAGNOSIS — Z800 Family history of malignant neoplasm of digestive organs: Secondary | ICD-10-CM | POA: Diagnosis not present

## 2019-07-19 DIAGNOSIS — J9601 Acute respiratory failure with hypoxia: Secondary | ICD-10-CM | POA: Diagnosis present

## 2019-07-19 DIAGNOSIS — Z8249 Family history of ischemic heart disease and other diseases of the circulatory system: Secondary | ICD-10-CM

## 2019-07-19 DIAGNOSIS — J9600 Acute respiratory failure, unspecified whether with hypoxia or hypercapnia: Secondary | ICD-10-CM | POA: Diagnosis not present

## 2019-07-19 DIAGNOSIS — M2550 Pain in unspecified joint: Secondary | ICD-10-CM | POA: Diagnosis not present

## 2019-07-19 DIAGNOSIS — I110 Hypertensive heart disease with heart failure: Principal | ICD-10-CM | POA: Diagnosis present

## 2019-07-19 DIAGNOSIS — M7989 Other specified soft tissue disorders: Secondary | ICD-10-CM | POA: Diagnosis present

## 2019-07-19 DIAGNOSIS — J9611 Chronic respiratory failure with hypoxia: Secondary | ICD-10-CM | POA: Diagnosis not present

## 2019-07-19 DIAGNOSIS — Z7401 Bed confinement status: Secondary | ICD-10-CM | POA: Diagnosis not present

## 2019-07-19 DIAGNOSIS — I5033 Acute on chronic diastolic (congestive) heart failure: Secondary | ICD-10-CM | POA: Diagnosis present

## 2019-07-19 DIAGNOSIS — R609 Edema, unspecified: Secondary | ICD-10-CM | POA: Diagnosis not present

## 2019-07-19 DIAGNOSIS — K219 Gastro-esophageal reflux disease without esophagitis: Secondary | ICD-10-CM | POA: Diagnosis not present

## 2019-07-19 DIAGNOSIS — Z9181 History of falling: Secondary | ICD-10-CM

## 2019-07-19 DIAGNOSIS — J9811 Atelectasis: Secondary | ICD-10-CM | POA: Diagnosis present

## 2019-07-19 DIAGNOSIS — R2681 Unsteadiness on feet: Secondary | ICD-10-CM | POA: Diagnosis present

## 2019-07-19 DIAGNOSIS — Z87891 Personal history of nicotine dependence: Secondary | ICD-10-CM

## 2019-07-19 DIAGNOSIS — Z9049 Acquired absence of other specified parts of digestive tract: Secondary | ICD-10-CM | POA: Diagnosis not present

## 2019-07-19 DIAGNOSIS — R0902 Hypoxemia: Secondary | ICD-10-CM | POA: Diagnosis not present

## 2019-07-19 DIAGNOSIS — Z743 Need for continuous supervision: Secondary | ICD-10-CM | POA: Diagnosis not present

## 2019-07-19 DIAGNOSIS — M6281 Muscle weakness (generalized): Secondary | ICD-10-CM | POA: Diagnosis not present

## 2019-07-19 DIAGNOSIS — E876 Hypokalemia: Secondary | ICD-10-CM | POA: Diagnosis not present

## 2019-07-19 DIAGNOSIS — I5031 Acute diastolic (congestive) heart failure: Secondary | ICD-10-CM | POA: Diagnosis not present

## 2019-07-19 DIAGNOSIS — Y92009 Unspecified place in unspecified non-institutional (private) residence as the place of occurrence of the external cause: Secondary | ICD-10-CM

## 2019-07-19 DIAGNOSIS — R262 Difficulty in walking, not elsewhere classified: Secondary | ICD-10-CM | POA: Diagnosis present

## 2019-07-19 DIAGNOSIS — R600 Localized edema: Secondary | ICD-10-CM | POA: Diagnosis not present

## 2019-07-19 LAB — CBC
HCT: 39.3 % (ref 36.0–46.0)
Hemoglobin: 12.5 g/dL (ref 12.0–15.0)
MCH: 29.1 pg (ref 26.0–34.0)
MCHC: 31.8 g/dL (ref 30.0–36.0)
MCV: 91.4 fL (ref 80.0–100.0)
Platelets: 192 10*3/uL (ref 150–400)
RBC: 4.3 MIL/uL (ref 3.87–5.11)
RDW: 14.1 % (ref 11.5–15.5)
WBC: 5.6 10*3/uL (ref 4.0–10.5)
nRBC: 0 % (ref 0.0–0.2)

## 2019-07-19 LAB — HEPATIC FUNCTION PANEL
ALT: 10 U/L (ref 0–44)
AST: 20 U/L (ref 15–41)
Albumin: 3.4 g/dL — ABNORMAL LOW (ref 3.5–5.0)
Alkaline Phosphatase: 78 U/L (ref 38–126)
Bilirubin, Direct: 0.3 mg/dL — ABNORMAL HIGH (ref 0.0–0.2)
Indirect Bilirubin: 1 mg/dL — ABNORMAL HIGH (ref 0.3–0.9)
Total Bilirubin: 1.3 mg/dL — ABNORMAL HIGH (ref 0.3–1.2)
Total Protein: 6.5 g/dL (ref 6.5–8.1)

## 2019-07-19 LAB — URINALYSIS, COMPLETE (UACMP) WITH MICROSCOPIC
Bacteria, UA: NONE SEEN
Bilirubin Urine: NEGATIVE
Glucose, UA: NEGATIVE mg/dL
Hgb urine dipstick: NEGATIVE
Ketones, ur: NEGATIVE mg/dL
Leukocytes,Ua: NEGATIVE
Nitrite: NEGATIVE
Protein, ur: NEGATIVE mg/dL
Specific Gravity, Urine: 1.004 — ABNORMAL LOW (ref 1.005–1.030)
pH: 7 (ref 5.0–8.0)

## 2019-07-19 LAB — BASIC METABOLIC PANEL
Anion gap: 8 (ref 5–15)
BUN: 15 mg/dL (ref 8–23)
CO2: 33 mmol/L — ABNORMAL HIGH (ref 22–32)
Calcium: 8.9 mg/dL (ref 8.9–10.3)
Chloride: 100 mmol/L (ref 98–111)
Creatinine, Ser: 0.87 mg/dL (ref 0.44–1.00)
GFR calc Af Amer: >60 mL/min (ref >60)
GFR calc non Af Amer: 55 mL/min — ABNORMAL LOW (ref >60)
Glucose, Bld: 95 mg/dL (ref 70–99)
Potassium: 3.3 mmol/L — ABNORMAL LOW (ref 3.5–5.1)
Sodium: 141 mmol/L (ref 135–145)

## 2019-07-19 LAB — ECHOCARDIOGRAM COMPLETE
Height: 65 in
Weight: 1840 oz

## 2019-07-19 LAB — POC SARS CORONAVIRUS 2 AG: SARS Coronavirus 2 Ag: NEGATIVE

## 2019-07-19 LAB — BRAIN NATRIURETIC PEPTIDE: B Natriuretic Peptide: 621 pg/mL — ABNORMAL HIGH (ref 0.0–100.0)

## 2019-07-19 LAB — PROCALCITONIN: Procalcitonin: <0.1 ng/mL

## 2019-07-19 MED ORDER — ASPIRIN EC 81 MG PO TBEC
81.0000 mg | DELAYED_RELEASE_TABLET | Freq: Every day | ORAL | Status: DC
  Administered 2019-07-19 – 2019-07-25 (×7): 81 mg via ORAL
  Filled 2019-07-19 (×8): qty 1

## 2019-07-19 MED ORDER — ESCITALOPRAM OXALATE 10 MG PO TABS
5.0000 mg | ORAL_TABLET | Freq: Every day | ORAL | Status: DC
  Administered 2019-07-20 – 2019-07-25 (×6): 5 mg via ORAL
  Filled 2019-07-19 (×6): qty 0.5

## 2019-07-19 MED ORDER — HYDRALAZINE HCL 20 MG/ML IJ SOLN
10.0000 mg | Freq: Four times a day (QID) | INTRAMUSCULAR | Status: DC | PRN
  Administered 2019-07-20: 10 mg via INTRAVENOUS
  Filled 2019-07-19 (×2): qty 0.5

## 2019-07-19 MED ORDER — GUAIFENESIN-DM 100-10 MG/5ML PO SYRP
10.0000 mL | ORAL_SOLUTION | Freq: Four times a day (QID) | ORAL | Status: DC | PRN
  Filled 2019-07-19: qty 10

## 2019-07-19 MED ORDER — FUROSEMIDE 10 MG/ML IJ SOLN
40.0000 mg | Freq: Once | INTRAMUSCULAR | Status: AC
  Administered 2019-07-19: 40 mg via INTRAVENOUS
  Filled 2019-07-19: qty 4

## 2019-07-19 MED ORDER — POTASSIUM CHLORIDE 10 MEQ/100ML IV SOLN
10.0000 meq | Freq: Once | INTRAVENOUS | Status: AC
  Administered 2019-07-19: 10 meq via INTRAVENOUS
  Filled 2019-07-19: qty 100

## 2019-07-19 MED ORDER — DOCUSATE SODIUM 100 MG PO CAPS: 100.0000 mg | ORAL_CAPSULE | Freq: Two times a day (BID) | ORAL | Status: DC | PRN

## 2019-07-19 MED ORDER — SODIUM CHLORIDE 0.9 % IV SOLN: Freq: Once | INTRAVENOUS | Status: AC

## 2019-07-19 MED ORDER — ONDANSETRON HCL 4 MG/2ML IJ SOLN: 4.0000 mg | Freq: Four times a day (QID) | INTRAMUSCULAR | Status: DC | PRN

## 2019-07-19 MED ORDER — PANTOPRAZOLE SODIUM 40 MG PO TBEC
40.0000 mg | DELAYED_RELEASE_TABLET | Freq: Every day | ORAL | Status: DC
  Administered 2019-07-19 – 2019-07-25 (×7): 40 mg via ORAL
  Filled 2019-07-19 (×7): qty 1

## 2019-07-19 MED ORDER — ALUM & MAG HYDROXIDE-SIMETH 200-200-20 MG/5ML PO SUSP: 15.0000 mL | Freq: Four times a day (QID) | ORAL | Status: DC | PRN

## 2019-07-19 MED ORDER — CALCIUM CARBONATE ANTACID 500 MG PO CHEW: 1.0000 | CHEWABLE_TABLET | Freq: Three times a day (TID) | ORAL | Status: DC | PRN

## 2019-07-19 MED ORDER — HEPARIN SODIUM (PORCINE) 5000 UNIT/ML IJ SOLN
5000.0000 [IU] | Freq: Three times a day (TID) | INTRAMUSCULAR | Status: DC
  Administered 2019-07-19 – 2019-07-25 (×19): 5000 [IU] via SUBCUTANEOUS
  Filled 2019-07-19 (×21): qty 1

## 2019-07-19 MED ORDER — POTASSIUM CHLORIDE CRYS ER 20 MEQ PO TBCR
40.0000 meq | EXTENDED_RELEASE_TABLET | Freq: Once | ORAL | Status: AC
  Administered 2019-07-19: 40 meq via ORAL
  Filled 2019-07-19: qty 2

## 2019-07-19 MED ORDER — ONDANSETRON 4 MG PO TBDP
4.0000 mg | ORAL_TABLET | Freq: Three times a day (TID) | ORAL | Status: DC | PRN
  Filled 2019-07-19: qty 1

## 2019-07-19 MED ORDER — ACETAMINOPHEN 500 MG PO TABS
1000.0000 mg | ORAL_TABLET | Freq: Three times a day (TID) | ORAL | Status: DC | PRN
  Administered 2019-07-20 – 2019-07-24 (×2): 1000 mg via ORAL
  Filled 2019-07-19 (×2): qty 2

## 2019-07-19 MED ORDER — POLYETHYLENE GLYCOL 3350 17 G PO PACK: 17.0000 g | PACK | Freq: Two times a day (BID) | ORAL | Status: DC | PRN

## 2019-07-19 MED ORDER — HYDRALAZINE HCL 50 MG PO TABS
50.0000 mg | ORAL_TABLET | Freq: Three times a day (TID) | ORAL | Status: DC
  Administered 2019-07-19 – 2019-07-23 (×13): 50 mg via ORAL
  Filled 2019-07-19 (×14): qty 1

## 2019-07-19 NOTE — ED Triage Notes (Signed)
arrives via ems from home where she lives with daughter.ems reports weakness last couple days, when standing up today legs weak and pt fell, unwitnessed. pt denies hitting head, denies pain. new leg swelling no Hx of any cardiac problems. 86% on 2l increased to 3L [place] and oxygen sat increased to 94%. EMS reports 39mm icrease in v3 v4 on ekg Pt a&o to self, time, unable to state why she was brought to ER. NAD noted at this time.  Pt on 2L [place] at home.   BP 159/82 HR 84  cbg 115 RR-24

## 2019-07-19 NOTE — ED Notes (Signed)
Significant swelling to pt's legs; 2+; states this has occurred over the last month. Pt cleansed with bath wipes as requested. Pt changed into gown. Bed sheets and pad changed. Bed locked low. Rails up. Call bell within reach. Pt currently on 3L via [place]. Denies any other needs. Denies pain.

## 2019-07-19 NOTE — H&P (Signed)
History and Physical    Taylor Watson XBM:[id number] DOB: 03/01/1919 DOA: 07/19/2019  PCP: [name], MD  Patient coming from: home  I have personally briefly reviewed patient's old medical records in [place]  Chief Complaint: falls  HPI: Taylor Watson is a 86 y.o. AA female with medical history significant of HTN, depression, CVA who presented for weakness and falls.   Daughter at bedside helped provide hx.  Pt was brought to the ED due to weakness and falls.  Pt reported her right leg gave away.  Pt had BLE swelling for the past week.  Pt also has intermittent dyspnea.  No fever, cough, chest pain, abdominal pain, N/V/D, dysuria.  Normal PO intake and didn't note increased salt intake, and did drink lots fluids.  No hx of CHF.   ED Course: On EMS arrival, pt 86% on 2L.  ED initial vitals: afebrile, pulse 81, BP 155/79, sating 98% on 2L.  Labs notable for normal WBC, BNP 621, CXR showed "new bibasilar opacification right worse than left likely small effusions with associated atelectasis."  COVID neg.  Pt received IV lasix 40 x1 in the ED before admission.   Assessment/Plan Active Problems:   Volume overload  # Acute hypoxic respiratory failure and BLE swelling due to volume overload --On EMS arrival, pt 86% on 2L.   BNP 621. CXR showed "new bibasilar opacification right worse than left likely small effusions with associated atelectasis."  Given age, likely has at least diastolic CHF. --received IV lasix 40 x1 in the ED PLAN: --continue IV diuresis based on urine output and Cr --Strict I/O --TTE  # Fall due to weakness --PT/OT   # HTN --continue home hydralazine --continue IV diuresis  # Hx of CVA --continue home ASA  # Depression --continue home Lexapro  # GERD --continue home PPI   DVT prophylaxis: Heparin SQ Code Status: DNR Discussed with pt and daughter at bedside, pt said she can't have chest compressions.  Daughter confirmed  DNR. Family Communication: Daughter updated at bedside.  Disposition Plan: Pt came from home, but is willing to go to rehab, pending PT/OT eval.  Will give IV diuretic until respiratory status improve (no need for O2) and swelling improve before discharge.   Admission status: Inpatient   Review of Systems: As per HPI otherwise 10 point review of systems negative.   Past Medical History:  Diagnosis Date  . Allergy   . Anemia   . CVA (cerebral vascular accident) ([place])   . Depression   . GERD (gastroesophageal reflux disease)   . History of blood transfusion   . History of kidney stones   . Hx: UTI (urinary tract infection)   . Hypercholesterolemia   . Hypertension     Past Surgical History:  Procedure Laterality Date  . CHOLECYSTECTOMY  1984  . NECK LESION BIOPSY  2015   Followed by Dr. Richardson Landry     reports that she has quit smoking. She has quit using smokeless tobacco.  Her smokeless tobacco use included chew. She reports that she does not drink alcohol or use drugs.  Allergies  Allergen Reactions  . Dyazide [Hydrochlorothiazide W-Triamterene]   . Monopril [Fosinopril]   . Toprol Xl [Metoprolol Tartrate]   . Verapamil     Family History  Problem Relation Age of Onset  . Cancer Mother        unknown type  . Hypertension Mother   . Cancer Father  unknown type  . Cancer Daughter   . Colon cancer Other        grandfather     Prior to Admission medications   Medication Sig Start Date End Date Taking? Authorizing Provider  aspirin EC 81 MG EC tablet Take 1 tablet (81 mg total) by mouth daily. 05/20/19  Yes Lavina Hamman, MD  escitalopram (LEXAPRO) 10 MG tablet Take 0.5 tablets (5 mg total) by mouth daily. 06/19/19  Yes [name], MD  hydrALAZINE (APRESOLINE) 50 MG tablet TAKE 1 TABLET BY MOUTH THREE TIMES A DAY 07/12/19  Yes [name], MD  omeprazole (PRILOSEC) 20 MG capsule Take 1 capsule (20 mg total) by mouth daily. 04/16/19  Yes [name],  MD    Physical Exam: Vitals:   07/19/19 1600 07/19/19 1615 07/19/19 1630 07/19/19 1645  BP: (!) 172/101     Pulse:  (!) 37 63 (!) 103  Resp: 16 15 18 14   Temp:      TempSrc:      SpO2:  97% 97% 100%  Weight:      Height:        Constitutional: NAD, alert, oriented to self and place, somewhat confused, can not give consistent answers. HEENT: conjunctivae and lids normal, EOMI CV: irregular, no M,R,G. Distal pulses +2.  No cyanosis.   RESP: CTA B/L, normal respiratory effort, on 2L GI: +BS, NTND Extremities: 2+ pitting edema in BLE SKIN: warm, dry and intact Neuro: II - XII grossly intact.  Sensation intact   Labs on Admission: I have personally reviewed following labs and imaging studies  CBC: Recent Labs  Lab 07/19/19 1053  WBC 5.6  HGB 12.5  HCT 39.3  MCV 91.4  PLT 025   Basic Metabolic Panel: Recent Labs  Lab 07/19/19 1053  NA 141  K 3.3*  CL 100  CO2 33*  GLUCOSE 95  BUN 15  CREATININE 0.87  CALCIUM 8.9   GFR: Estimated Creatinine Clearance: 29.8 mL/min (by C-G formula based on SCr of 0.87 mg/dL). Liver Function Tests: Recent Labs  Lab 07/19/19 1053  AST 20  ALT 10  ALKPHOS 78  BILITOT 1.3*  PROT 6.5  ALBUMIN 3.4*   No results for input(s): LIPASE, AMYLASE in the last 168 hours. No results for input(s): AMMONIA in the last 168 hours. Coagulation Profile: No results for input(s): INR, PROTIME in the last 168 hours. Cardiac Enzymes: No results for input(s): CKTOTAL, CKMB, CKMBINDEX, TROPONINI in the last 168 hours. BNP (last 3 results) No results for input(s): PROBNP in the last 8760 hours. HbA1C: No results for input(s): HGBA1C in the last 72 hours. CBG: No results for input(s): GLUCAP in the last 168 hours. Lipid Profile: No results for input(s): CHOL, HDL, LDLCALC, TRIG, CHOLHDL, LDLDIRECT in the last 72 hours. Thyroid Function Tests: No results for input(s): TSH, T4TOTAL, FREET4, T3FREE, THYROIDAB in the last 72 hours. Anemia  Panel: No results for input(s): VITAMINB12, FOLATE, FERRITIN, TIBC, IRON, RETICCTPCT in the last 72 hours. Urine analysis:    Component Value Date/Time   COLORURINE STRAW (A) 07/19/2019 1053   APPEARANCEUR CLEAR (A) 07/19/2019 1053   APPEARANCEUR Clear 09/21/2011 0059   LABSPEC 1.004 (L) 07/19/2019 1053   LABSPEC 1.005 09/21/2011 0059   PHURINE 7.0 07/19/2019 [place] 07/19/2019 1053   GLUCOSEU Negative 09/21/2011 0059   HGBUR NEGATIVE 07/19/2019 1053   [place] 07/19/2019 1053   BILIRUBINUR Negative 09/21/2011 [place] 07/19/2019 1053  PROTEINUR NEGATIVE 07/19/2019 1053   NITRITE NEGATIVE 07/19/2019 [place] 07/19/2019 1053   LEUKOCYTESUR 1+ 09/21/2011 0059    Radiological Exams on Admission: DG Chest Portable 1 View  Result Date: 07/19/2019 CLINICAL DATA:  Shortness of breath and weakness. EXAM: PORTABLE CHEST 1 VIEW COMPARISON:  05/17/2019 FINDINGS: Patient slightly rotated to the left. Lungs are adequately inflated demonstrate new bibasilar opacification right worse than left likely small effusions with associated atelectasis although infection in the lung bases is possible. Mild prominence of the central pulmonary vessels. Borderline cardiomegaly. Remainder of the exam is unchanged. IMPRESSION: New bibasilar opacification right worse than left likely small effusions with associated elect cysts as infection in the lung bases is possible. Electronically Signed   By: Marin Olp M.D.   On: 07/19/2019 12:39   ECHOCARDIOGRAM COMPLETE  Result Date: 07/19/2019    ECHOCARDIOGRAM REPORT   Patient Name:   Taylor Watson Date of Exam: 07/19/2019 Medical Rec #:  [id number]          Height:       65.0 in Accession #:    [id number]         Weight:       115.0 lb Date of Birth:  1919/02/03          BSA:          1.564 m Patient Age:    35 years           BP:           183/90 mmHg Patient Gender: F                  HR:            71 bpm. Exam Location:  [facility] Procedure: 2D Echo, Cardiac Doppler and Color Doppler Indications:     CHF 428.0  History:         Patient has no prior history of Echocardiogram examinations.                  Risk Factors:Hypertension. CVA.  Sonographer:     Sherrie Sport RDCS (AE) Referring Phys:  [id number] Otila Kluver Shawnta Zimbelman Diagnosing Phys: Yolonda Kida MD IMPRESSIONS  1. Left ventricular ejection fraction, by estimation, is 55 to 60%. The left ventricle has normal function. The left ventricle has no regional wall motion abnormalities. There is moderate concentric left ventricular hypertrophy. Left ventricular diastolic parameters are consistent with Grade I diastolic dysfunction (impaired relaxation).  2. Right ventricular systolic function is normal. The right ventricular size is normal. There is severely elevated pulmonary artery systolic pressure.  3. The mitral valve is grossly normal. Mild mitral valve regurgitation.  4. The aortic valve is normal in structure. Aortic valve regurgitation is not visualized. FINDINGS  Left Ventricle: Left ventricular ejection fraction, by estimation, is 55 to 60%. The left ventricle has normal function. The left ventricle has no regional wall motion abnormalities. The left ventricular internal cavity size was normal in size. There is  moderate concentric left ventricular hypertrophy. Left ventricular diastolic parameters are consistent with Grade I diastolic dysfunction (impaired relaxation). Right Ventricle: The right ventricular size is normal. No increase in right ventricular wall thickness. Right ventricular systolic function is normal. There is severely elevated pulmonary artery systolic pressure. The tricuspid regurgitant velocity is 4.11 m/s, and with an assumed right atrial pressure of 10 mmHg, the estimated right ventricular systolic pressure is 17.7 mmHg. Left Atrium: Left atrial size was normal in  size. Right Atrium: Right atrial size was normal in size. Pericardium: There is  no evidence of pericardial effusion. Mitral Valve: The mitral valve is grossly normal. There is mild thickening of the mitral valve leaflet(s). Mild mitral annular calcification. Mild mitral valve regurgitation. MV peak gradient, 12.2 mmHg. The mean mitral valve gradient is 5.0 mmHg. Tricuspid Valve: The tricuspid valve is normal in structure. Tricuspid valve regurgitation is mild. Aortic Valve: The aortic valve is normal in structure. Aortic valve regurgitation is not visualized. Aortic valve mean gradient measures 6.7 mmHg. Aortic valve peak gradient measures 11.7 mmHg. Aortic valve area, by VTI measures 1.68 cm. Pulmonic Valve: The pulmonic valve was normal in structure. Pulmonic valve regurgitation is not visualized. Aorta: The aortic root is normal in size and structure. IAS/Shunts: No atrial level shunt detected by color flow Doppler.  LEFT VENTRICLE PLAX 2D LVIDd:         2.72 cm  Diastology LVIDs:         1.92 cm  LV e' lateral:   4.03 cm/s LV PW:         1.25 cm  LV E/e' lateral: 22.6 LV IVS:        1.12 cm  LV e' medial:    3.37 cm/s LVOT diam:     2.00 cm  LV E/e' medial:  27.1 LV SV:         53 LV SV Index:   34 LVOT Area:     3.14 cm  RIGHT VENTRICLE RV Basal diam:  3.49 cm LEFT ATRIUM              Index       RIGHT ATRIUM           Index LA diam:        3.90 cm  2.49 cm/m  RA Area:     13.50 cm LA Vol (A2C):   113.0 ml 72.27 ml/m RA Volume:   31.00 ml  19.83 ml/m LA Vol (A4C):   87.0 ml  55.64 ml/m LA Biplane Vol: 103.0 ml 65.88 ml/m  AORTIC VALVE                    PULMONIC VALVE AV Area (Vmax):    1.59 cm     PV Vmax:        0.67 m/s AV Area (Vmean):   1.48 cm     PV Peak grad:   1.8 mmHg AV Area (VTI):     1.68 cm     RVOT Peak grad: 2 mmHg AV Vmax:           171.00 cm/s AV Vmean:          117.333 cm/s AV VTI:            0.318 m AV Peak Grad:      11.7 mmHg AV Mean Grad:      6.7 mmHg LVOT Vmax:         86.50 cm/s LVOT Vmean:        55.300 cm/s LVOT VTI:          0.170 m LVOT/AV VTI  ratio: 0.53  AORTA Ao Root diam: 2.80 cm MITRAL VALVE                TRICUSPID VALVE MV Area (PHT): 2.50 cm     TR Peak grad:   67.6 mmHg MV Peak grad:  12.2 mmHg    TR Vmax:  411.00 cm/s MV Mean grad:  5.0 mmHg MV Vmax:       1.75 m/s     SHUNTS MV Vmean:      100.0 cm/s   Systemic VTI:  0.17 m MV Decel Time: 303 msec     Systemic Diam: 2.00 cm MV E velocity: 91.20 cm/s MV A velocity: 161.00 cm/s MV E/A ratio:  0.57 Dwayne Prince [place] MD Electronically signed by Yolonda Kida MD Signature Date/Time: 07/19/2019/4:23:48 PM    Final       Enzo Bi MD [facility]  If 7PM-7AM, please contact night-coverage 07/19/2019, 6:18 PM

## 2019-07-19 NOTE — Progress Notes (Signed)
*  PRELIMINARY RESULTS* Echocardiogram 2D Echocardiogram has been performed.  Taylor Watson 07/19/2019, 2:49 PM

## 2019-07-19 NOTE — ED Notes (Signed)
Pt given food tray.

## 2019-07-19 NOTE — ED Provider Notes (Signed)
[facility] Emergency Department Provider Note  ____________________________________________   First MD Initiated Contact with Patient 07/19/19 1047     (approximate)  I have reviewed the triage vital signs and the nursing notes.   HISTORY  Chief Complaint Fatigue and Fall    HPI NAARAH BORGERDING is a 86 y.o. female  HTN, HLD, here with weakness. Per report, pt has had increasing weakness and difficulty walking for the past several days. She has had associated increased swelling in her b/l legs. She's also had increased SOB and mild cough. SOB is worse w/ lying flat. She has had difficulty walking today due to this weakness, along with mild confusion. She states that her legs "just don't want to go the right way." No alleviating factors. Does not recall if she has changed any medications recently.        Past Medical History:  Diagnosis Date  . Allergy   . Anemia   . CVA (cerebral vascular accident) ([place])   . Depression   . GERD (gastroesophageal reflux disease)   . History of blood transfusion   . History of kidney stones   . Hx: UTI (urinary tract infection)   . Hypercholesterolemia   . Hypertension     Patient Active Problem List   Diagnosis Date Noted  . Volume overload 07/19/2019  . Hypertensive urgency 05/18/2019  . Acute metabolic encephalopathy 03/54/6568  . Elevated troponin 05/18/2019  . Hypokalemia 05/18/2019  . URI (upper respiratory infection) 08/26/2018  . Headache 07/17/2017  . SOB (shortness of breath) 03/28/2016  . Near syncope 03/01/2016  . Anemia 10/28/2015  . Lower GI bleed   . Goals of care, counseling/discussion   . Blood loss anemia 06/01/2015  . GERD (gastroesophageal reflux disease)   . Depression   . Unsteady gait 02/08/2015  . Schwannoma 10/07/2013  . Rectal bleeding 04/03/2013  . Light headedness 01/19/2013  . Loss of weight 10/22/2012  . Hypercholesterolemia 10/22/2012  . History of CVA  (cerebrovascular accident) 10/22/2012  . Anxiety 10/22/2012  . Essential hypertension, benign 10/22/2012    Past Surgical History:  Procedure Laterality Date  . CHOLECYSTECTOMY  1984  . NECK LESION BIOPSY  2015   Followed by Dr. Richardson Landry    Prior to Admission medications   Medication Sig Start Date End Date Taking? Authorizing Provider  aspirin EC 81 MG EC tablet Take 1 tablet (81 mg total) by mouth daily. 05/20/19  Yes Lavina Hamman, MD  escitalopram (LEXAPRO) 10 MG tablet Take 0.5 tablets (5 mg total) by mouth daily. 06/19/19  Yes [name], MD  hydrALAZINE (APRESOLINE) 50 MG tablet TAKE 1 TABLET BY MOUTH THREE TIMES A DAY 07/12/19  Yes [name], MD  omeprazole (PRILOSEC) 20 MG capsule Take 1 capsule (20 mg total) by mouth daily. 04/16/19  Yes [name], MD    Allergies Dyazide [hydrochlorothiazide w-triamterene], Monopril [fosinopril], Toprol xl [metoprolol tartrate], and Verapamil  Family History  Problem Relation Age of Onset  . Cancer Mother        unknown type  . Hypertension Mother   . Cancer Father        unknown type  . Cancer Daughter   . Colon cancer Other        grandfather    Social History Social History   Tobacco Use  . Smoking status: Former Research scientist (life sciences)  . Smokeless tobacco: Former Systems developer    Types: Chew  Substance Use Topics  . Alcohol use: No  Alcohol/week: 0.0 standard drinks  . Drug use: No    Review of Systems  Review of Systems  Constitutional: Positive for fatigue. Negative for fever.  HENT: Negative for congestion and sore throat.   Eyes: Negative for visual disturbance.  Respiratory: Negative for cough and shortness of breath.   Cardiovascular: Positive for leg swelling. Negative for chest pain.  Gastrointestinal: Negative for abdominal pain, diarrhea, nausea and vomiting.  Genitourinary: Negative for flank pain.  Musculoskeletal: Negative for back pain and neck pain.  Skin: Negative for rash and wound.  Neurological:  Positive for weakness.  All other systems reviewed and are negative.    ____________________________________________  PHYSICAL EXAM:      VITAL SIGNS: ED Triage Vitals  Enc Vitals Group     BP 07/19/19 1043 (!) 155/79     Pulse Rate 07/19/19 1043 81     Resp 07/19/19 1043 14     Temp 07/19/19 1043 98.7 F (37.1 C)     Temp Source 07/19/19 1043 Oral     SpO2 07/19/19 1043 98 %     Weight 07/19/19 1045 115 lb (52.2 kg)     Height 07/19/19 1045 5\' 5"  (1.651 m)     Head Circumference --      Peak Flow --      Pain Score 07/19/19 1045 0     Pain Loc --      Pain Edu? --      Excl. in [place]? --      Physical Exam Vitals and nursing note reviewed.  Constitutional:      General: She is not in acute distress.    Appearance: She is well-developed.  HENT:     Head: Normocephalic and atraumatic.  Eyes:     Conjunctiva/sclera: Conjunctivae normal.  Cardiovascular:     Rate and Rhythm: Normal rate and regular rhythm.     Heart sounds: Normal heart sounds.  Pulmonary:     Effort: Pulmonary effort is normal. No respiratory distress.     Breath sounds: Rales (bibasilar) present. No wheezing.  Abdominal:     General: There is no distension.  Musculoskeletal:     Cervical back: Neck supple.     Right lower leg: Edema (2+) present.     Left lower leg: Edema (2+) present.  Skin:    General: Skin is warm.     Capillary Refill: Capillary refill takes less than 2 seconds.     Findings: No rash.  Neurological:     Mental Status: She is alert and oriented to person, place, and time.     Motor: No abnormal muscle tone.       ____________________________________________   LABS (all labs ordered are listed, but only abnormal results are displayed)  Labs Reviewed  BASIC METABOLIC PANEL - Abnormal; Notable for the following components:      Result Value   Potassium 3.3 (*)    CO2 33 (*)    GFR calc non Af Amer 55 (*)    All other components within normal limits  URINALYSIS,  COMPLETE (UACMP) WITH MICROSCOPIC - Abnormal; Notable for the following components:   Color, Urine STRAW (*)    APPearance CLEAR (*)    Specific Gravity, Urine 1.004 (*)    All other components within normal limits  BRAIN NATRIURETIC PEPTIDE - Abnormal; Notable for the following components:   B Natriuretic Peptide 621.0 (*)    All other components within normal limits  HEPATIC FUNCTION PANEL - Abnormal;  Notable for the following components:   Albumin 3.4 (*)    Total Bilirubin 1.3 (*)    Bilirubin, Direct 0.3 (*)    Indirect Bilirubin 1.0 (*)    All other components within normal limits  SARS CORONAVIRUS 2 (TAT 6-24 HRS)  CBC  PROCALCITONIN  BASIC METABOLIC PANEL  CBC  MAGNESIUM  POC SARS CORONAVIRUS 2 AG -  ED  POC SARS CORONAVIRUS 2 AG  CBG MONITORING, ED    ____________________________________________  EKG: Sinus rhythm with frequent PACs, VR 88. QRS narrow at 107. QTc 471. LAFB, no acute ST elevations. Non-specific precordial changes without ST elevations or signs of acute ischemia/infract. ________________________________________  RADIOLOGY All imaging, including plain films, CT scans, and ultrasounds, independently reviewed by me, and interpretations confirmed via formal radiology reads.  ED MD interpretation:   CXR: Bibasilar opacities, possible edema  Official radiology report(s): DG Chest Portable 1 View  Result Date: 07/19/2019 CLINICAL DATA:  Shortness of breath and weakness. EXAM: PORTABLE CHEST 1 VIEW COMPARISON:  05/17/2019 FINDINGS: Patient slightly rotated to the left. Lungs are adequately inflated demonstrate new bibasilar opacification right worse than left likely small effusions with associated atelectasis although infection in the lung bases is possible. Mild prominence of the central pulmonary vessels. Borderline cardiomegaly. Remainder of the exam is unchanged. IMPRESSION: New bibasilar opacification right worse than left likely small effusions with  associated elect cysts as infection in the lung bases is possible. Electronically Signed   By: Marin Olp M.D.   On: 07/19/2019 12:39   ECHOCARDIOGRAM COMPLETE  Result Date: 07/19/2019    ECHOCARDIOGRAM REPORT   Patient Name:   DAZHANE VILLAGOMEZ Date of Exam: 07/19/2019 Medical Rec #:  [id number]          Height:       65.0 in Accession #:    [id number]         Weight:       115.0 lb Date of Birth:  [birth year]          BSA:          1.564 m Patient Age:    65 years           BP:           183/90 mmHg Patient Gender: F                  HR:           71 bpm. Exam Location:  [facility] Procedure: 2D Echo, Cardiac Doppler and Color Doppler Indications:     CHF 428.0  History:         Patient has no prior history of Echocardiogram examinations.                  Risk Factors:Hypertension. CVA.  Sonographer:     Sherrie Sport RDCS (AE) Referring Phys:  [id number] Otila Kluver LAI Diagnosing Phys: Yolonda Kida MD IMPRESSIONS  1. Left ventricular ejection fraction, by estimation, is 55 to 60%. The left ventricle has normal function. The left ventricle has no regional wall motion abnormalities. There is moderate concentric left ventricular hypertrophy. Left ventricular diastolic parameters are consistent with Grade I diastolic dysfunction (impaired relaxation).  2. Right ventricular systolic function is normal. The right ventricular size is normal. There is severely elevated pulmonary artery systolic pressure.  3. The mitral valve is grossly normal. Mild mitral valve regurgitation.  4. The aortic valve is normal in structure. Aortic valve regurgitation is not visualized. FINDINGS  Left Ventricle: Left ventricular ejection fraction, by estimation, is 55 to 60%. The left ventricle has normal function. The left ventricle has no regional wall motion abnormalities. The left ventricular internal cavity size was normal in size. There is  moderate concentric left ventricular hypertrophy. Left ventricular diastolic parameters are consistent  with Grade I diastolic dysfunction (impaired relaxation). Right Ventricle: The right ventricular size is normal. No increase in right ventricular wall thickness. Right ventricular systolic function is normal. There is severely elevated pulmonary artery systolic pressure. The tricuspid regurgitant velocity is 4.11 m/s, and with an assumed right atrial pressure of 10 mmHg, the estimated right ventricular systolic pressure is 37.4 mmHg. Left Atrium: Left atrial size was normal in size. Right Atrium: Right atrial size was normal in size. Pericardium: There is no evidence of pericardial effusion. Mitral Valve: The mitral valve is grossly normal. There is mild thickening of the mitral valve leaflet(s). Mild mitral annular calcification. Mild mitral valve regurgitation. MV peak gradient, 12.2 mmHg. The mean mitral valve gradient is 5.0 mmHg. Tricuspid Valve: The tricuspid valve is normal in structure. Tricuspid valve regurgitation is mild. Aortic Valve: The aortic valve is normal in structure. Aortic valve regurgitation is not visualized. Aortic valve mean gradient measures 6.7 mmHg. Aortic valve peak gradient measures 11.7 mmHg. Aortic valve area, by VTI measures 1.68 cm. Pulmonic Valve: The pulmonic valve was normal in structure. Pulmonic valve regurgitation is not visualized. Aorta: The aortic root is normal in size and structure. IAS/Shunts: No atrial level shunt detected by color flow Doppler.  LEFT VENTRICLE PLAX 2D LVIDd:         2.72 cm  Diastology LVIDs:         1.92 cm  LV e' lateral:   4.03 cm/s LV PW:         1.25 cm  LV E/e' lateral: 22.6 LV IVS:        1.12 cm  LV e' medial:    3.37 cm/s LVOT diam:     2.00 cm  LV E/e' medial:  27.1 LV SV:         53 LV SV Index:   34 LVOT Area:     3.14 cm  RIGHT VENTRICLE RV Basal diam:  3.49 cm LEFT ATRIUM              Index       RIGHT ATRIUM           Index LA diam:        3.90 cm  2.49 cm/m  RA Area:     13.50 cm LA Vol (A2C):   113.0 ml 72.27 ml/m RA Volume:    31.00 ml  19.83 ml/m LA Vol (A4C):   87.0 ml  55.64 ml/m LA Biplane Vol: 103.0 ml 65.88 ml/m  AORTIC VALVE                    PULMONIC VALVE AV Area (Vmax):    1.59 cm     PV Vmax:        0.67 m/s AV Area (Vmean):   1.48 cm     PV Peak grad:   1.8 mmHg AV Area (VTI):     1.68 cm     RVOT Peak grad: 2 mmHg AV Vmax:           171.00 cm/s AV Vmean:          117.333 cm/s AV VTI:  0.318 m AV Peak Grad:      11.7 mmHg AV Mean Grad:      6.7 mmHg LVOT Vmax:         86.50 cm/s LVOT Vmean:        55.300 cm/s LVOT VTI:          0.170 m LVOT/AV VTI ratio: 0.53  AORTA Ao Root diam: 2.80 cm MITRAL VALVE                TRICUSPID VALVE MV Area (PHT): 2.50 cm     TR Peak grad:   67.6 mmHg MV Peak grad:  12.2 mmHg    TR Vmax:        411.00 cm/s MV Mean grad:  5.0 mmHg MV Vmax:       1.75 m/s     SHUNTS MV Vmean:      100.0 cm/s   Systemic VTI:  0.17 m MV Decel Time: 303 msec     Systemic Diam: 2.00 cm MV E velocity: 91.20 cm/s MV A velocity: 161.00 cm/s MV E/A ratio:  0.57 Dwayne D Callwood MD Electronically signed by Yolonda Kida MD Signature Date/Time: 07/19/2019/4:23:48 PM    Final     ____________________________________________  PROCEDURES   Procedure(s) performed (including Critical Care):  .1-3 Lead EKG Interpretation Performed by: Duffy Bruce, MD Authorized by: Duffy Bruce, MD     Interpretation: non-specific     ECG rate:  90-100s   ECG rate assessment: tachycardic     Rhythm: sinus rhythm     Ectopy: none     Conduction: normal   Comments:     Indication: Shortness of breath, CHF    ____________________________________________  INITIAL IMPRESSION / MDM / ASSESSMENT AND PLAN / ED COURSE  As part of my medical decision making, I reviewed the following data within the [facility] notes reviewed and incorporated, Old chart reviewed, Notes from prior ED visits, and [place] Controlled Substance Database       *JOANNAH GITLIN was evaluated in  Emergency Department on 07/19/2019 for the symptoms described in the history of present illness. She was evaluated in the context of the global COVID-19 pandemic, which necessitated consideration that the patient might be at risk for infection with the SARS-CoV-2 virus that causes COVID-19. Institutional protocols and algorithms that pertain to the evaluation of patients at risk for COVID-19 are in a state of rapid change based on information released by regulatory bodies including the CDC and federal and state organizations. These policies and algorithms were followed during the patient's care in the ED.  Some ED evaluations and interventions may be delayed as a result of limited staffing during the pandemic.*     Medical Decision Making:  86 yo F here with significant SOB, weakness, and falls. On exam, pt appears significantly hypervolemic with pitting edema and bilateral rales. Suspect component of new onset CHF. She has frequent pACs as well, would not rule out underlying arrhythmia. Labs are otherwise reassuring. Renal function normal. Potassium repleted. Will admit to medicine. IV lasix given.  ____________________________________________  FINAL CLINICAL IMPRESSION(S) / ED DIAGNOSES  Final diagnoses:  Congestive heart failure, unspecified HF chronicity, unspecified heart failure type [facility])     MEDICATIONS GIVEN DURING THIS VISIT:  Medications  heparin injection 5,000 Units (5,000 Units Subcutaneous Given 07/19/19 1449)  aspirin EC tablet 81 mg (81 mg Oral Given 07/19/19 1448)  hydrALAZINE (APRESOLINE) tablet 50 mg (50 mg Oral Given 07/19/19 1448)  escitalopram (LEXAPRO) tablet 5 mg (has no administration in time range)  pantoprazole (PROTONIX) EC tablet 40 mg (40 mg Oral Given 07/19/19 1448)  acetaminophen (TYLENOL) tablet 1,000 mg (has no administration in time range)  alum & mag hydroxide-simeth (MAALOX/MYLANTA) 200-200-20 MG/5ML suspension 15 mL (has no administration in time range)    calcium carbonate (TUMS - dosed in mg elemental calcium) chewable tablet 200 mg of elemental calcium (has no administration in time range)  docusate sodium (COLACE) capsule 100 mg (has no administration in time range)  guaiFENesin-dextromethorphan (ROBITUSSIN DM) 100-10 MG/5ML syrup 10 mL (has no administration in time range)  polyethylene glycol (MIRALAX / GLYCOLAX) packet 17 g (has no administration in time range)  ondansetron (ZOFRAN-ODT) disintegrating tablet 4 mg (has no administration in time range)  ondansetron (ZOFRAN) injection 4 mg (has no administration in time range)  hydrALAZINE (APRESOLINE) injection 10 mg (has no administration in time range)  furosemide (LASIX) injection 40 mg (40 mg Intravenous Given 07/19/19 1353)  potassium chloride SA (KLOR-CON) CR tablet 40 mEq (40 mEq Oral Given 07/19/19 1352)  potassium chloride 10 mEq in 100 mL IVPB (0 mEq Intravenous Stopped 07/19/19 1510)  0.9 %  sodium chloride infusion ( Intravenous Stopped 07/19/19 1524)     ED Discharge Orders    None       Note:  This document was prepared using Dragon voice recognition software and may include unintentional dictation errors.   Duffy Bruce, MD 07/19/19 1753

## 2019-07-19 NOTE — ED Notes (Deleted)
This RN placed purewick on pt and ensured that undergarments and linens are clean.

## 2019-07-19 NOTE — ED Notes (Signed)
Chuck in lab states the hepatic function panel will be added on soon.

## 2019-07-20 LAB — BASIC METABOLIC PANEL
Anion gap: 9 (ref 5–15)
BUN: 15 mg/dL (ref 8–23)
CO2: 34 mmol/L — ABNORMAL HIGH (ref 22–32)
Calcium: 8.7 mg/dL — ABNORMAL LOW (ref 8.9–10.3)
Chloride: 100 mmol/L (ref 98–111)
Creatinine, Ser: 0.85 mg/dL (ref 0.44–1.00)
GFR calc Af Amer: >60 mL/min (ref >60)
GFR calc non Af Amer: 57 mL/min — ABNORMAL LOW (ref >60)
Glucose, Bld: 81 mg/dL (ref 70–99)
Potassium: 3.3 mmol/L — ABNORMAL LOW (ref 3.5–5.1)
Sodium: 143 mmol/L (ref 135–145)

## 2019-07-20 LAB — CBC
HCT: 38.6 % (ref 36.0–46.0)
Hemoglobin: 12.4 g/dL (ref 12.0–15.0)
MCH: 29.2 pg (ref 26.0–34.0)
MCHC: 32.1 g/dL (ref 30.0–36.0)
MCV: 91 fL (ref 80.0–100.0)
Platelets: 209 10*3/uL (ref 150–400)
RBC: 4.24 MIL/uL (ref 3.87–5.11)
RDW: 14.1 % (ref 11.5–15.5)
WBC: 4.5 10*3/uL (ref 4.0–10.5)
nRBC: 0 % (ref 0.0–0.2)

## 2019-07-20 LAB — MAGNESIUM: Magnesium: 2 mg/dL (ref 1.7–2.4)

## 2019-07-20 LAB — SARS CORONAVIRUS 2 (TAT 6-24 HRS): SARS Coronavirus 2: NEGATIVE

## 2019-07-20 MED ORDER — DILTIAZEM HCL 30 MG PO TABS
30.0000 mg | ORAL_TABLET | Freq: Four times a day (QID) | ORAL | Status: DC
  Administered 2019-07-20 – 2019-07-21 (×6): 30 mg via ORAL
  Filled 2019-07-20 (×6): qty 1

## 2019-07-20 MED ORDER — FUROSEMIDE 10 MG/ML IJ SOLN
40.0000 mg | Freq: Once | INTRAMUSCULAR | Status: AC
  Administered 2019-07-20: 40 mg via INTRAVENOUS
  Filled 2019-07-20: qty 4

## 2019-07-20 MED ORDER — POTASSIUM CHLORIDE CRYS ER 20 MEQ PO TBCR
40.0000 meq | EXTENDED_RELEASE_TABLET | Freq: Once | ORAL | Status: AC
  Administered 2019-07-20: 40 meq via ORAL
  Filled 2019-07-20: qty 2

## 2019-07-20 NOTE — TOC Initial Note (Signed)
Transition of Care [facility]) - Initial/Assessment Note    Patient Details  Name: Taylor Watson MRN: [id number] Date of Birth: October 19, 1918  Transition of Care [facility]) CM/SW Contact:    Su Hilt, RN Phone Number: 07/20/2019, 10:14 AM  Clinical Narrative:                 Met with the patient in the room, she lives at home with her daughter.  She ambulates at home and has a Corporate investment banker but needs a RW for safety, I notified Brad with [employer] and it was brought into the room for the patient to take home, She has Oxygen at home but is unsure the agency that provides it.  She wants to use [facility] PT as she has used [facility] in the past, I reached out to [place] with [facility] and he will check and see if they are able to see the patient and let me know. She stated that she has transportation with family and is up to date with her PCP, no additional needs  Expected Discharge Plan: [place] Barriers to Discharge: Continued Medical Work up   Patient Goals and CMS Choice Patient states their goals for this hospitalization and ongoing recovery are:: go home      Expected Discharge Plan and Services Expected Discharge Plan: [place]   Discharge Planning Services: CM Consult   Living arrangements for the past 2 months: Single Family Home                 DME Arranged: [place] rolling DME Agency: [employer] Date DME Agency Contacted: 07/20/19 Time DME Agency Contacted: 1006   [place]: PT [place] Agency: [place] ([place]) Date [place]: 07/20/19 Time [place]: 1014 Representative spoke with at [place]: Corene Cornea  Prior Living Arrangements/Services Living arrangements for the past 2 months: [place] with:: Adult Children Patient language and need for interpreter reviewed:: Yes Do you feel safe going back to the place where you live?: Yes      Need for Family Participation in Patient Care: No  (Comment) Care giver support system in place?: Yes (comment) Current home services: DME Criminal Activity/Legal Involvement Pertinent to Current Situation/Hospitalization: No - Comment as needed  Activities of Daily Living Home Assistive Devices/Equipment: None ADL Screening (condition at time of admission) Patient's cognitive ability adequate to safely complete daily activities?: Yes Is the patient deaf or have difficulty hearing?: No Does the patient have difficulty seeing, even when wearing glasses/contacts?: No Does the patient have difficulty concentrating, remembering, or making decisions?: No Patient able to express need for assistance with ADLs?: Yes Does the patient have difficulty dressing or bathing?: Yes Independently performs ADLs?: No Grooming: Needs assistance Bathing: Needs assistance Is this a change from baseline?: Change from baseline, expected to last <3 days In/Out Bed: Needs assistance Walks in Home: Appropriate for developmental age Does the patient have difficulty walking or climbing stairs?: Yes Weakness of Legs: Both Weakness of Arms/Hands: None  Permission Sought/Granted   Permission granted to share information with : Yes, Verbal Permission Granted              Emotional Assessment Appearance:: Appears stated age Attitude/Demeanor/Rapport: Engaged Affect (typically observed): Appropriate Orientation: : Oriented to Self, Oriented to Place, Oriented to  Time, Oriented to Situation Alcohol / Substance Use: Not Applicable Psych Involvement: No (comment)  Admission diagnosis:  Volume overload [E87.70] Congestive heart  failure, unspecified HF chronicity, unspecified heart failure type [facility]) [I50.9] Patient Active Problem List   Diagnosis Date Noted  . Volume overload 07/19/2019  . Hypertensive urgency 05/18/2019  . Acute metabolic encephalopathy 59/27/6394  . Elevated troponin 05/18/2019  . Hypokalemia 05/18/2019  . URI (upper respiratory  infection) 08/26/2018  . Headache 07/17/2017  . SOB (shortness of breath) 03/28/2016  . Near syncope 03/01/2016  . Anemia 10/28/2015  . Lower GI bleed   . Goals of care, counseling/discussion   . Blood loss anemia 06/01/2015  . GERD (gastroesophageal reflux disease)   . Depression   . Unsteady gait 02/08/2015  . Schwannoma 10/07/2013  . Rectal bleeding 04/03/2013  . Light headedness 01/19/2013  . Loss of weight 10/22/2012  . Hypercholesterolemia 10/22/2012  . History of CVA (cerebrovascular accident) 10/22/2012  . Anxiety 10/22/2012  . Essential hypertension, benign 10/22/2012   PCP:  [name], MD Pharmacy:   [facility], [address] [place] [address] Phone: [phone] Fax: [phone]  [id number] [place] [place] [id number]: [phone]: [phone]    Social Determinants of Health (SDOH) Interventions    Readmission Risk Interventions No flowsheet data found.

## 2019-07-20 NOTE — Progress Notes (Signed)
PROGRESS NOTE    CING   IOX:[id number] DOB: 09-Oct-1918 DOA: 07/19/2019 PCP: [name], MD    Assessment & Plan:   Active Problems:   Volume overload    Taylor Watson is a 86 y.o. AA female with medical history significant of HTN, depression, CVA who presented for weakness and falls.    # Acute hypoxic respiratory failure and BLE swelling due to volume overload # Acute diastolic CHF exacerbation --On EMS arrival, pt 86% on 2L.   BNP 621. CXR showed "new bibasilar opacification right worse than left likely small effusions with associated atelectasis."   --TTE showed normal LVEF, grade I diastolic dysfunction. --received IV lasix 40 x1 in the ED PLAN: --IV lasix 40 mg x2 today --Strict I/O --wean O2 as able  # Afib w RVR --No known prior hx.  Maybe brought on by CHF exacerbation, or vice versa. PLAN: --Start dilt 30 mg q6h --No anticoagulation due to hx of bleeding (discussed with family) --Monitor on tele  # Fall due to weakness --PT/OT   # HTN --continue home hydralazine --continue IV diuresis  # Hx of CVA --continue home ASA  # Depression --continue home Lexapro  # GERD --continue home PPI   DVT prophylaxis: Heparin SQ Code Status: DNR Discussed with pt and daughter on admission.  pt said she can't have chest compressions.  Daughter confirmed DNR. Family Communication: Daughter and grandson updated at bedside today.  Disposition Plan: Pt came from home, but is willing to go to rehab, pending PT/OT eval.  Will give IV diuretic until respiratory status improve (no need for O2) and swelling improve before discharge.  Likely another 2 days.   Subjective and Interval History:  Pt reported breathing about the same.  Complained of legs hurting (per family, chronic).  No fever, chest pain, abdominal pain, N/V/D, dysuria.  Pt was found to be in Afib with occasional RVR.   Objective: Vitals:   07/19/19 2352 07/20/19 0845 07/20/19  1210 07/20/19 1610  BP: (!) 166/57 (!) 182/69 127/66 125/79  Pulse: 94 89 72 61  Resp: 16 16 16 16   Temp: 98.4 F (36.9 C) 98.5 F (36.9 C) 98.1 F (36.7 C) (!) 97.5 F (36.4 C)  TempSrc:   Oral Oral  SpO2: 95% 93% 98% 100%  Weight:      Height:       No intake or output data in the 24 hours ending 07/20/19 1711 Filed Weights   07/19/19 1045  Weight: 52.2 kg    Examination:   Constitutional: NAD, alert, oriented to self and place HEENT: conjunctivae and lids normal, EOMI CV: irregular, no M,R,G. Distal pulses +2.  No cyanosis.   RESP: CTA B/L, normal respiratory effort, on 2L GI: +BS, NTND Extremities: 1+ pitting edema in BLE, improved since yesterday SKIN: warm, dry and intact Neuro: II - XII grossly intact.  Sensation intact   Data Reviewed: I have personally reviewed following labs and imaging studies  CBC: Recent Labs  Lab 07/19/19 1053 07/20/19 0425  WBC 5.6 4.5  HGB 12.5 12.4  HCT 39.3 38.6  MCV 91.4 91.0  PLT 192 268   Basic Metabolic Panel: Recent Labs  Lab 07/19/19 1053 07/20/19 0425  NA 141 143  K 3.3* 3.3*  CL 100 100  CO2 33* 34*  GLUCOSE 95 81  BUN 15 15  CREATININE 0.87 0.85  CALCIUM 8.9 8.7*  MG  --  2.0   GFR: Estimated Creatinine Clearance: 30.5 mL/min (by C-G  formula based on SCr of 0.85 mg/dL). Liver Function Tests: Recent Labs  Lab 07/19/19 1053  AST 20  ALT 10  ALKPHOS 78  BILITOT 1.3*  PROT 6.5  ALBUMIN 3.4*   No results for input(s): LIPASE, AMYLASE in the last 168 hours. No results for input(s): AMMONIA in the last 168 hours. Coagulation Profile: No results for input(s): INR, PROTIME in the last 168 hours. Cardiac Enzymes: No results for input(s): CKTOTAL, CKMB, CKMBINDEX, TROPONINI in the last 168 hours. BNP (last 3 results) No results for input(s): PROBNP in the last 8760 hours. HbA1C: No results for input(s): HGBA1C in the last 72 hours. CBG: No results for input(s): GLUCAP in the last 168 hours. Lipid  Profile: No results for input(s): CHOL, HDL, LDLCALC, TRIG, CHOLHDL, LDLDIRECT in the last 72 hours. Thyroid Function Tests: No results for input(s): TSH, T4TOTAL, FREET4, T3FREE, THYROIDAB in the last 72 hours. Anemia Panel: No results for input(s): VITAMINB12, FOLATE, FERRITIN, TIBC, IRON, RETICCTPCT in the last 72 hours. Sepsis Labs: Recent Labs  Lab 07/19/19 1053  PROCALCITON <0.10    Recent Results (from the past 240 hour(s))  SARS CORONAVIRUS 2 (TAT 6-24 HRS) Nasopharyngeal Nasopharyngeal Swab     Status: None   Collection Time: 07/19/19  6:52 PM   Specimen: Nasopharyngeal Swab  Result Value Ref Range Status   SARS Coronavirus 2 NEGATIVE NEGATIVE Final    Comment: (NOTE) SARS-CoV-2 target nucleic acids are NOT DETECTED. The SARS-CoV-2 RNA is generally detectable in upper and lower respiratory specimens during the acute phase of infection. Negative results do not preclude SARS-CoV-2 infection, do not rule out co-infections with other pathogens, and should not be used as the sole basis for treatment or other patient management decisions. Negative results must be combined with clinical observations, patient history, and epidemiological information. The expected result is Negative. Fact Sheet for Patients: SugarRoll.be Fact Sheet for Healthcare Providers: https://www.woods-mathews.com/ This test is not yet approved or cleared by the [place] FDA and  has been authorized for detection and/or diagnosis of SARS-CoV-2 by FDA under an Emergency Use Authorization (EUA). This EUA will remain  in effect (meaning this test can be used) for the duration of the COVID-19 declaration under Section 56 4(b)(1) of the Act, 21 U.S.C. section 360bbb-3(b)(1), unless the authorization is terminated or revoked sooner. Performed at [facility], [place] [address] [place], [place] [address]       Radiology Studies: DG Chest Portable 1  View  Result Date: 07/19/2019 CLINICAL DATA:  Shortness of breath and weakness. EXAM: PORTABLE CHEST 1 VIEW COMPARISON:  05/17/2019 FINDINGS: Patient slightly rotated to the left. Lungs are adequately inflated demonstrate new bibasilar opacification right worse than left likely small effusions with associated atelectasis although infection in the lung bases is possible. Mild prominence of the central pulmonary vessels. Borderline cardiomegaly. Remainder of the exam is unchanged. IMPRESSION: New bibasilar opacification right worse than left likely small effusions with associated elect cysts as infection in the lung bases is possible. Electronically Signed   By: Marin Olp M.D.   On: 07/19/2019 12:39   ECHOCARDIOGRAM COMPLETE  Result Date: 07/19/2019    ECHOCARDIOGRAM REPORT   Patient Name:   ALLI JASMER Date of Exam: 07/19/2019 Medical Rec #:  [id number]          Height:       65.0 in Accession #:    [id number]         Weight:  115.0 lb Date of Birth:  1918-07-20          BSA:          1.564 m Patient Age:    64 years           BP:           183/90 mmHg Patient Gender: F                  HR:           71 bpm. Exam Location:  [facility] Procedure: 2D Echo, Cardiac Doppler and Color Doppler Indications:     CHF 428.0  History:         Patient has no prior history of Echocardiogram examinations.                  Risk Factors:Hypertension. CVA.  Sonographer:     Sherrie Sport RDCS (AE) Referring Phys:  [id number] Otila Kluver Cherrie Franca Diagnosing Phys: Yolonda Kida MD IMPRESSIONS  1. Left ventricular ejection fraction, by estimation, is 55 to 60%. The left ventricle has normal function. The left ventricle has no regional wall motion abnormalities. There is moderate concentric left ventricular hypertrophy. Left ventricular diastolic parameters are consistent with Grade I diastolic dysfunction (impaired relaxation).  2. Right ventricular systolic function is normal. The right ventricular size is normal. There is severely  elevated pulmonary artery systolic pressure.  3. The mitral valve is grossly normal. Mild mitral valve regurgitation.  4. The aortic valve is normal in structure. Aortic valve regurgitation is not visualized. FINDINGS  Left Ventricle: Left ventricular ejection fraction, by estimation, is 55 to 60%. The left ventricle has normal function. The left ventricle has no regional wall motion abnormalities. The left ventricular internal cavity size was normal in size. There is  moderate concentric left ventricular hypertrophy. Left ventricular diastolic parameters are consistent with Grade I diastolic dysfunction (impaired relaxation). Right Ventricle: The right ventricular size is normal. No increase in right ventricular wall thickness. Right ventricular systolic function is normal. There is severely elevated pulmonary artery systolic pressure. The tricuspid regurgitant velocity is 4.11 m/s, and with an assumed right atrial pressure of 10 mmHg, the estimated right ventricular systolic pressure is 64.6 mmHg. Left Atrium: Left atrial size was normal in size. Right Atrium: Right atrial size was normal in size. Pericardium: There is no evidence of pericardial effusion. Mitral Valve: The mitral valve is grossly normal. There is mild thickening of the mitral valve leaflet(s). Mild mitral annular calcification. Mild mitral valve regurgitation. MV peak gradient, 12.2 mmHg. The mean mitral valve gradient is 5.0 mmHg. Tricuspid Valve: The tricuspid valve is normal in structure. Tricuspid valve regurgitation is mild. Aortic Valve: The aortic valve is normal in structure. Aortic valve regurgitation is not visualized. Aortic valve mean gradient measures 6.7 mmHg. Aortic valve peak gradient measures 11.7 mmHg. Aortic valve area, by VTI measures 1.68 cm. Pulmonic Valve: The pulmonic valve was normal in structure. Pulmonic valve regurgitation is not visualized. Aorta: The aortic root is normal in size and structure. IAS/Shunts: No atrial  level shunt detected by color flow Doppler.  LEFT VENTRICLE PLAX 2D LVIDd:         2.72 cm  Diastology LVIDs:         1.92 cm  LV e' lateral:   4.03 cm/s LV PW:         1.25 cm  LV E/e' lateral: 22.6 LV IVS:        1.12 cm  LV e' medial:    3.37 cm/s LVOT diam:     2.00 cm  LV E/e' medial:  27.1 LV SV:         53 LV SV Index:   34 LVOT Area:     3.14 cm  RIGHT VENTRICLE RV Basal diam:  3.49 cm LEFT ATRIUM              Index       RIGHT ATRIUM           Index LA diam:        3.90 cm  2.49 cm/m  RA Area:     13.50 cm LA Vol (A2C):   113.0 ml 72.27 ml/m RA Volume:   31.00 ml  19.83 ml/m LA Vol (A4C):   87.0 ml  55.64 ml/m LA Biplane Vol: 103.0 ml 65.88 ml/m  AORTIC VALVE                    PULMONIC VALVE AV Area (Vmax):    1.59 cm     PV Vmax:        0.67 m/s AV Area (Vmean):   1.48 cm     PV Peak grad:   1.8 mmHg AV Area (VTI):     1.68 cm     RVOT Peak grad: 2 mmHg AV Vmax:           171.00 cm/s AV Vmean:          117.333 cm/s AV VTI:            0.318 m AV Peak Grad:      11.7 mmHg AV Mean Grad:      6.7 mmHg LVOT Vmax:         86.50 cm/s LVOT Vmean:        55.300 cm/s LVOT VTI:          0.170 m LVOT/AV VTI ratio: 0.53  AORTA Ao Root diam: 2.80 cm MITRAL VALVE                TRICUSPID VALVE MV Area (PHT): 2.50 cm     TR Peak grad:   67.6 mmHg MV Peak grad:  12.2 mmHg    TR Vmax:        411.00 cm/s MV Mean grad:  5.0 mmHg MV Vmax:       1.75 m/s     SHUNTS MV Vmean:      100.0 cm/s   Systemic VTI:  0.17 m MV Decel Time: 303 msec     Systemic Diam: 2.00 cm MV E velocity: 91.20 cm/s MV A velocity: 161.00 cm/s MV E/A ratio:  0.57 Dwayne D Callwood MD Electronically signed by Yolonda Kida MD Signature Date/Time: 07/19/2019/4:23:48 PM    Final      Scheduled Meds: . aspirin EC  81 mg Oral Daily  . diltiazem  30 mg Oral Q6H  . escitalopram  5 mg Oral Daily  . furosemide  40 mg Intravenous Once  . heparin  5,000 Units Subcutaneous Q8H  . hydrALAZINE  50 mg Oral TID  . pantoprazole  40 mg Oral Daily    Continuous Infusions:   LOS: 1 day     Enzo Bi, MD [facility] If 7PM-7AM, please contact night-coverage 07/20/2019, 5:11 PM

## 2019-07-20 NOTE — Evaluation (Signed)
Occupational Therapy Evaluation Patient Details Name: Taylor Watson MRN: [id number] DOB: 21-Mar-1919 Today's Date: 07/20/2019    History of Present Illness 86 y.o. female with medical history significant of hypertension, hyperlipidemia, stroke, GERD, depression, UTI, who presents with weakness, Le swelling, falls and shortness of breath.   Clinical Impression   Approached patient for OT evaluation and pt agreeable to session.  Patient finishing eating her lunch.  Previously, pt reports being MOD I with BADLs and utilizing rollator for functional mobility.  Patient lives with daughter and daughter provides support for community tasks and homemaking.  Patient reports no pain at this time, but does endorse a "tingly" feeling in B feet.  Vitals at 127/66, 87 HR and 89% 02 on RA.  Patient able to move to EOB and performed grooming tasks in sitting.  Performed sit<>stand with MIN A with elevated surface and cues for body mechanics/sequencing.  02 remained above 89% for duration of treatment, but 2.5L 02 was replaced at end of session.  Patient would benefit from further occupational therapy services address performance components outlined below.  Based on today's performance, recommending HH OT with 24/7 SPV pending continual improvement.  Patient states she is open to going to rehab if she needs to.    Follow Up Recommendations  Home health OT    Equipment Recommendations  Other (comment)(2WW)    Recommendations for Other Services       Precautions / Restrictions Precautions Precautions: Fall Restrictions Weight Bearing Restrictions: No      Mobility Bed Mobility Overal bed mobility: Needs Assistance Bed Mobility: Supine to Sit;Sit to Supine     Supine to sit: Min guard;HOB elevated Sit to supine: Min guard;Min assist   General bed mobility comments: Requires extra time and use of bed railing for assistance.  Transfers Overall transfer level: Needs assistance Equipment used:  Rolling walker (2 wheeled) Transfers: Sit to/from Stand Sit to Stand: Min guard;Min assist;From elevated surface         General transfer comment: Provided education on body mechanics, sequencing and hand placement for improved independence    Balance Overall balance assessment: Needs assistance Sitting-balance support: Feet supported;Single extremity supported;Bilateral upper extremity supported Sitting balance-Leahy Scale: Good                                  ADL either performed or assessed with clinical judgement   ADL Overall ADL's : Needs assistance/impaired     Grooming: Wash/dry hands;Wash/dry face;Sitting;Supervision/safety;Set up               Lower Body Dressing: Moderate assistance;Sit to/from stand;Cueing for compensatory techniques Lower Body Dressing Details (indicate cue type and reason): d/t swelling Toilet Transfer: Minimal assistance;RW;Stand-pivot             General ADL Comments: Requires some extra time and VCs for sequencing/body mechanics/hand placement     Vision Patient Visual Report: No change from baseline       Perception     Praxis      Pertinent Vitals/Pain Pain Assessment: No/denies pain(Reports no pain but states B LEs are feeling "tingly")     Hand Dominance Right   Extremity/Trunk Assessment Upper Extremity Assessment Upper Extremity Assessment: Overall WFL for tasks assessed   Lower Extremity Assessment Lower Extremity Assessment: Defer to PT evaluation       Communication Communication Communication: No difficulties   Cognition Arousal/Alertness: Awake/alert Behavior During Therapy: [facility] for  tasks assessed/performed Overall Cognitive Status: Within Functional Limits for tasks assessed                                 General Comments: Cooperative and pleasant   General Comments  Patient continues on 2.5L of 02.  Doffed 02 to complete grooming and stayed >89%.    Exercises Other  Exercises Other Exercises: Provided education on goals/role of OT in acute care setting Other Exercises: Provided education on general safety (i.e. call button, bed controls, etc) Other Exercises: Performed bed mobility with CGA and extra time.  Grooming tasks at EOB with SBA.  Performed sit to stand with elevated surface requiring MIN A with education on body mechanics, sequencing and hand placement.   Shoulder Instructions      Home Living Family/patient expects to be discharged to:: Private residence Living Arrangements: Children(Daughter) Available Help at Discharge: Family   Home Access: Stairs to enter Technical brewer of Steps: 3 Entrance Stairs-Rails: Right Home Layout: One level     Bathroom Shower/Tub: (Patient states "we don't have a shower")   Bathroom Toilet: [place]: Walker - 4 wheels          Prior Functioning/Environment Level of Independence: Independent with assistive device(s)        Comments: Patient states she uses supplemental 02 at home when she needs it.  Reports she uses "walker with a seat" for mobility and does not require assistance for BADLs.        OT Problem List: Decreased strength;Decreased activity tolerance;Impaired sensation;Decreased knowledge of precautions      OT Treatment/Interventions: Self-care/ADL training;Therapeutic exercise;Energy conservation;DME and/or AE instruction;Therapeutic activities;Patient/family education    OT Goals(Current goals can be found in the care plan section) Acute Rehab OT Goals Patient Stated Goal: "feel a little better" OT Goal Formulation: With patient Time For Goal Achievement: 08/02/19 Potential to Achieve Goals: Good  OT Frequency: Min 1X/week   Barriers to D/C:            Co-evaluation              AM-PAC OT "6 Clicks" Daily Activity     Outcome Measure Help from another person eating meals?: None Help from another person taking care of personal  grooming?: None Help from another person toileting, which includes using toliet, bedpan, or urinal?: A Little Help from another person bathing (including washing, rinsing, drying)?: A Lot Help from another person to put on and taking off regular upper body clothing?: None Help from another person to put on and taking off regular lower body clothing?: A Lot 6 Click Score: 19   End of Session Equipment Utilized During Treatment: Gait belt;Rolling walker  Activity Tolerance: Patient tolerated treatment well;No increased pain Patient left: in bed;with call bell/phone within reach;with bed alarm set  OT Visit Diagnosis: Unsteadiness on feet (R26.81)                Time: 2841-3244 OT Time Calculation (min): 21 min Charges:  OT General Charges $OT Visit: 1 Visit OT Evaluation $OT Eval Low Complexity: 1 Low OT Treatments $Self Care/Home Management : 8-22 mins  Baldomero Lamy, MS, OTR/L 07/20/19, 2:01 PM

## 2019-07-20 NOTE — Evaluation (Signed)
Physical Therapy Evaluation Patient Details Name: Taylor Watson MRN: [id number] DOB: 1918/05/17 Today's Date: 07/20/2019   History of Present Illness  86 y.o. female with medical history significant of hypertension, hyperlipidemia, stroke, GERD, depression, UTI, who presents with weakness, Le swelling, falls and shortness of breath.  Clinical Impression  Pt did relatively well with mobility and (after LOB needing heavy assist to keep from falling) was able to ambulate around the foot of the bed and in the room with good relative confidence and safety.  Pt on O2 t/o the session with sats generally staying in the high 80s/low 90s.  She was able to follow instructions well and showed reasonable insight, but did display some mild confusion - unsure how this relates to her baseline.  Pt should be able to safely return home as O2 and LE swelling improves.      Follow Up Recommendations Home health PT(per continued improvement) , 24/7 supervision   Equipment Recommendations  Rolling walker with 5" wheels(if pt indeed only has a 4WW at home)    Recommendations for Other Services Rehab consult     Precautions / Restrictions Precautions Precautions: Fall Restrictions Weight Bearing Restrictions: No      Mobility  Bed Mobility Overal bed mobility: Modified Independent             General bed mobility comments: Pt was able to get herself to EOB w/o direct assist, was reliant on rails/UEs  Transfers Overall transfer level: Needs assistance Equipment used: Rolling walker (2 wheeled) Transfers: Sit to/from Stand Sit to Stand: Min guard         General transfer comment: Pt failed to attain standing on 2 attempts on her own, PT cued to use UEs on bed/rail and she was able to rise w/o direct assist.  Ambulation/Gait Ambulation/Gait assistance: Max assist;Min guard Gait Distance (Feet): 25 Feet Assistive device: Rolling walker (2 wheeled)       General Gait Details: Pt had a  fall while turning away from the bed, she needed total assist to keep from hitting the floor.  Once assisted back to standing she was able to walk relatively well and safely.  She reports that that knees give out on her with some regularity and that she has had multiple falls.   Stairs            Wheelchair Mobility    Modified Rankin (Stroke Patients Only)       Balance Overall balance assessment: Needs assistance   Sitting balance-Leahy Scale: Good       Standing balance-Leahy Scale: Fair Standing balance comment: Apart from the LOB/knee buckling during turn pt showed good confidence and balance with ambulation/standing mobility                              Pertinent Vitals/Pain Pain Assessment: No/denies pain    Home Living Family/patient expects to be discharged to:: Private residence Living Arrangements: Children Available Help at Discharge: Family   Home Access: Stairs to enter Entrance Stairs-Rails: Right Entrance Stairs-Number of Steps: 3(per previous notes, states no steps today?) Home Layout: One level Home Equipment: Walker - 4 wheels(prev doc states she has FWW, today reports 4WW?)      Prior Function Level of Independence: Independent with assistive device(s)         Comments: Pt states that she does not leave the home much, uses O2 occasionally (up to a few times/week depending on  how she is feeling)       Hand Dominance        Extremity/Trunk Assessment   Upper Extremity Assessment Upper Extremity Assessment: Generalized weakness;Overall [facility] for tasks assessed(age appropriate deficits)    Lower Extremity Assessment Lower Extremity Assessment: Generalized weakness;Overall WFL for tasks assessed(age appropriate deficits)       Communication   Communication: No difficulties  Cognition Arousal/Alertness: Awake/alert Behavior During Therapy: WFL for tasks assessed/performed Overall Cognitive Status: Within Functional Limits  for tasks assessed                                        General Comments General comments (skin integrity, edema, etc.): Pt on 2.5 L O2 t/o the session, O2 sats remain 87-92% at rest and during activity.    Exercises     Assessment/Plan    PT Assessment Patient needs continued PT services  PT Problem List Decreased strength;Decreased balance;Decreased activity tolerance;Decreased range of motion;Decreased mobility;Decreased safety awareness;Decreased knowledge of use of DME       PT Treatment Interventions DME instruction;Gait training;Stair training;Functional mobility training;Therapeutic activities;Therapeutic exercise;Balance training;Neuromuscular re-education;Cognitive remediation;Patient/family education    PT Goals (Current goals can be found in the Care Plan section)  Acute Rehab PT Goals Patient Stated Goal: Go home, but willing to go to rehab if needed PT Goal Formulation: With patient Time For Goal Achievement: 08/03/19 Potential to Achieve Goals: Good    Frequency Min 2X/week   Barriers to discharge        Co-evaluation               AM-PAC PT "6 Clicks" Mobility  Outcome Measure Help needed turning from your back to your side while in a flat bed without using bedrails?: None Help needed moving from lying on your back to sitting on the side of a flat bed without using bedrails?: None Help needed moving to and from a bed to a chair (including a wheelchair)?: A Little Help needed standing up from a chair using your arms (e.g., wheelchair or bedside chair)?: A Little Help needed to walk in hospital room?: A Little Help needed climbing 3-5 steps with a railing? : A Lot 6 Click Score: 19    End of Session Equipment Utilized During Treatment: Gait belt;Oxygen Activity Tolerance: Patient tolerated treatment well;Patient limited by fatigue Patient left: with chair alarm set;with call bell/phone within reach Nurse Communication: Mobility  status PT Visit Diagnosis: Muscle weakness (generalized) (M62.81);Difficulty in walking, not elsewhere classified (R26.2)    Time: 8657-8469 PT Time Calculation (min) (ACUTE ONLY): 27 min   Charges:   PT Evaluation $PT Eval Low Complexity: 1 Low PT Treatments $Therapeutic Activity: 8-22 mins        Kreg Shropshire, DPT 07/20/2019, 10:29 AM

## 2019-07-21 LAB — BASIC METABOLIC PANEL
Anion gap: 11 (ref 5–15)
BUN: 13 mg/dL (ref 8–23)
CO2: 33 mmol/L — ABNORMAL HIGH (ref 22–32)
Calcium: 8.4 mg/dL — ABNORMAL LOW (ref 8.9–10.3)
Chloride: 97 mmol/L — ABNORMAL LOW (ref 98–111)
Creatinine, Ser: 0.92 mg/dL (ref 0.44–1.00)
GFR calc Af Amer: >60 mL/min (ref >60)
GFR calc non Af Amer: 52 mL/min — ABNORMAL LOW (ref >60)
Glucose, Bld: 74 mg/dL (ref 70–99)
Potassium: 3.2 mmol/L — ABNORMAL LOW (ref 3.5–5.1)
Sodium: 141 mmol/L (ref 135–145)

## 2019-07-21 LAB — CBC
HCT: 38.2 % (ref 36.0–46.0)
Hemoglobin: 11.7 g/dL — ABNORMAL LOW (ref 12.0–15.0)
MCH: 28.1 pg (ref 26.0–34.0)
MCHC: 30.6 g/dL (ref 30.0–36.0)
MCV: 91.6 fL (ref 80.0–100.0)
Platelets: 207 10*3/uL (ref 150–400)
RBC: 4.17 MIL/uL (ref 3.87–5.11)
RDW: 14 % (ref 11.5–15.5)
WBC: 3.1 10*3/uL — ABNORMAL LOW (ref 4.0–10.5)
nRBC: 0 % (ref 0.0–0.2)

## 2019-07-21 LAB — MAGNESIUM: Magnesium: 1.8 mg/dL (ref 1.7–2.4)

## 2019-07-21 MED ORDER — FUROSEMIDE 10 MG/ML IJ SOLN
40.0000 mg | Freq: Once | INTRAMUSCULAR | Status: AC
  Administered 2019-07-21: 40 mg via INTRAVENOUS
  Filled 2019-07-21: qty 4

## 2019-07-21 MED ORDER — DILTIAZEM HCL ER COATED BEADS 120 MG PO CP24
120.0000 mg | ORAL_CAPSULE | Freq: Every day | ORAL | Status: DC
  Administered 2019-07-22: 120 mg via ORAL
  Filled 2019-07-21 (×2): qty 1

## 2019-07-21 MED ORDER — POTASSIUM CHLORIDE CRYS ER 20 MEQ PO TBCR
40.0000 meq | EXTENDED_RELEASE_TABLET | Freq: Once | ORAL | Status: AC
  Administered 2019-07-21: 40 meq via ORAL
  Filled 2019-07-21: qty 2

## 2019-07-21 NOTE — Plan of Care (Signed)
  Problem: Clinical Measurements: Goal: Ability to maintain clinical measurements within normal limits will improve Outcome: Progressing Goal: Will remain free from infection Outcome: Progressing Goal: Respiratory complications will improve Outcome: Progressing   Problem: Activity: Goal: Risk for activity intolerance will decrease Outcome: Progressing   Problem: Nutrition: Goal: Adequate nutrition will be maintained Outcome: Progressing   Problem: Elimination: Goal: Will not experience complications related to bowel motility Outcome: Progressing   Problem: Pain Managment: Goal: General experience of comfort will improve Outcome: Progressing   Problem: Safety: Goal: Ability to remain free from injury will improve Outcome: Progressing   Problem: Skin Integrity: Goal: Risk for impaired skin integrity will decrease Outcome: Progressing

## 2019-07-21 NOTE — Progress Notes (Signed)
PROGRESS NOTE    Taylor Watson  HWE:[id number] DOB: 05-17-1918 DOA: 07/19/2019 PCP: [name], MD    Assessment & Plan:   Active Problems:   Volume overload    Taylor Watson is a 86 y.o. AA female with medical history significant of HTN, depression, CVA who presented for weakness and falls.    # Acute hypoxic respiratory failure and BLE swelling due to volume overload # Acute diastolic CHF exacerbation --On EMS arrival, pt 86% on 2L.   BNP 621. CXR showed "new bibasilar opacification right worse than left likely small effusions with associated atelectasis."   --TTE showed normal LVEF, grade I diastolic dysfunction. --received IV lasix 40 x1 in the ED PLAN: --continue IV lasix 40 mg x2 today --Strict I/O --wean O2 as able  # Afib w RVR --No known prior hx.  Maybe brought on by CHF exacerbation, or vice versa. --Started dilt 30 mg q6h with good rate control PLAN: --convert to cardizem CD 120 mg daily tomorrow morning --No anticoagulation due to hx of bleeding (discussed with family) --Monitor on tele  # Fall due to weakness --PT/OT rec [place]  # HTN --continue home hydralazine --continue IV diuresis  # Hx of CVA --continue home ASA  # Depression --continue home Lexapro  # GERD --continue home PPI   DVT prophylaxis: Heparin SQ Code Status: DNR Discussed with pt and daughter on admission.  pt said she can't have chest compressions.  Daughter confirmed DNR. Family Communication:  Disposition Plan: Pt came from home, likely can return home tomorrow if back on room air.  PT rec HHPT.   Subjective and Interval History:  Pt reported breathing better today.  No more leg pain.  No fever, chest pain, abdominal pain, N/V/D, dysuria.   Objective: Vitals:   07/20/19 1820 07/20/19 2139 07/21/19 0820 07/21/19 1514  BP: 121/78 (!) 161/93 (!) 144/75 128/62  Pulse: 62 82 (!) 40 64  Resp: 16 18 14 18   Temp: 97.8 F (36.6 C) 97.7 F (36.5 C) 98.5 F  (36.9 C) 97.9 F (36.6 C)  TempSrc: Oral Oral  Oral  SpO2: 100% 98% 99% 97%  Weight:      Height:        Intake/Output Summary (Last 24 hours) at 07/21/2019 1743 Last data filed at 07/21/2019 1240 Gross per 24 hour  Intake --  Output 1525 ml  Net -1525 ml   Filed Weights   07/19/19 1045  Weight: 52.2 kg    Examination:   Constitutional: NAD, alert, oriented to self and place HEENT: conjunctivae and lids normal, EOMI CV: irregular, no M,R,G. Distal pulses +2.  No cyanosis.   RESP: CTA B/L, normal respiratory effort GI: +BS, NTND Extremities: 1+ pitting edema in BLE, improved since yesterday SKIN: warm, dry and intact Neuro: II - XII grossly intact.  Sensation intact   Data Reviewed: I have personally reviewed following labs and imaging studies  CBC: Recent Labs  Lab 07/19/19 1053 07/20/19 0425 07/21/19 0638  WBC 5.6 4.5 3.1*  HGB 12.5 12.4 11.7*  HCT 39.3 38.6 38.2  MCV 91.4 91.0 91.6  PLT 192 209 893   Basic Metabolic Panel: Recent Labs  Lab 07/19/19 1053 07/20/19 0425 07/21/19 0638  NA 141 143 141  K 3.3* 3.3* 3.2*  CL 100 100 97*  CO2 33* 34* 33*  GLUCOSE 95 81 74  BUN 15 15 13   CREATININE 0.87 0.85 0.92  CALCIUM 8.9 8.7* 8.4*  MG  --  2.0 1.8  GFR: Estimated Creatinine Clearance: 28.1 mL/min (by C-G formula based on SCr of 0.92 mg/dL). Liver Function Tests: Recent Labs  Lab 07/19/19 1053  AST 20  ALT 10  ALKPHOS 78  BILITOT 1.3*  PROT 6.5  ALBUMIN 3.4*   No results for input(s): LIPASE, AMYLASE in the last 168 hours. No results for input(s): AMMONIA in the last 168 hours. Coagulation Profile: No results for input(s): INR, PROTIME in the last 168 hours. Cardiac Enzymes: No results for input(s): CKTOTAL, CKMB, CKMBINDEX, TROPONINI in the last 168 hours. BNP (last 3 results) No results for input(s): PROBNP in the last 8760 hours. HbA1C: No results for input(s): HGBA1C in the last 72 hours. CBG: No results for input(s): GLUCAP in  the last 168 hours. Lipid Profile: No results for input(s): CHOL, HDL, LDLCALC, TRIG, CHOLHDL, LDLDIRECT in the last 72 hours. Thyroid Function Tests: No results for input(s): TSH, T4TOTAL, FREET4, T3FREE, THYROIDAB in the last 72 hours. Anemia Panel: No results for input(s): VITAMINB12, FOLATE, FERRITIN, TIBC, IRON, RETICCTPCT in the last 72 hours. Sepsis Labs: Recent Labs  Lab 07/19/19 1053  PROCALCITON <0.10    Recent Results (from the past 240 hour(s))  SARS CORONAVIRUS 2 (TAT 6-24 HRS) Nasopharyngeal Nasopharyngeal Swab     Status: None   Collection Time: 07/19/19  6:52 PM   Specimen: Nasopharyngeal Swab  Result Value Ref Range Status   SARS Coronavirus 2 NEGATIVE NEGATIVE Final    Comment: (NOTE) SARS-CoV-2 target nucleic acids are NOT DETECTED. The SARS-CoV-2 RNA is generally detectable in upper and lower respiratory specimens during the acute phase of infection. Negative results do not preclude SARS-CoV-2 infection, do not rule out co-infections with other pathogens, and should not be used as the sole basis for treatment or other patient management decisions. Negative results must be combined with clinical observations, patient history, and epidemiological information. The expected result is Negative. Fact Sheet for Patients: SugarRoll.be Fact Sheet for Healthcare Providers: https://www.woods-mathews.com/ This test is not yet approved or cleared by the [place] FDA and  has been authorized for detection and/or diagnosis of SARS-CoV-2 by FDA under an Emergency Use Authorization (EUA). This EUA will remain  in effect (meaning this test can be used) for the duration of the COVID-19 declaration under Section 56 4(b)(1) of the Act, 21 U.S.C. section 360bbb-3(b)(1), unless the authorization is terminated or revoked sooner. Performed at [facility], [place] [address] [place], [place] [address]       Radiology  Studies: No results found.   Scheduled Meds: . aspirin EC  81 mg Oral Daily  . diltiazem  30 mg Oral Q6H  . escitalopram  5 mg Oral Daily  . heparin  5,000 Units Subcutaneous Q8H  . hydrALAZINE  50 mg Oral TID  . pantoprazole  40 mg Oral Daily   Continuous Infusions:   LOS: 2 days     Enzo Bi, MD [facility] If 7PM-7AM, please contact night-coverage 07/21/2019, 5:43 PM

## 2019-07-22 DIAGNOSIS — I509 Heart failure, unspecified: Secondary | ICD-10-CM

## 2019-07-22 DIAGNOSIS — I480 Paroxysmal atrial fibrillation: Secondary | ICD-10-CM | POA: Diagnosis present

## 2019-07-22 DIAGNOSIS — J9601 Acute respiratory failure with hypoxia: Secondary | ICD-10-CM | POA: Diagnosis present

## 2019-07-22 DIAGNOSIS — M7989 Other specified soft tissue disorders: Secondary | ICD-10-CM | POA: Diagnosis present

## 2019-07-22 DIAGNOSIS — Y92009 Unspecified place in unspecified non-institutional (private) residence as the place of occurrence of the external cause: Secondary | ICD-10-CM

## 2019-07-22 DIAGNOSIS — W19XXXA Unspecified fall, initial encounter: Secondary | ICD-10-CM

## 2019-07-22 DIAGNOSIS — I4891 Unspecified atrial fibrillation: Secondary | ICD-10-CM | POA: Diagnosis present

## 2019-07-22 LAB — BASIC METABOLIC PANEL
Anion gap: 12 (ref 5–15)
BUN: 13 mg/dL (ref 8–23)
CO2: 36 mmol/L — ABNORMAL HIGH (ref 22–32)
Calcium: 8.7 mg/dL — ABNORMAL LOW (ref 8.9–10.3)
Chloride: 91 mmol/L — ABNORMAL LOW (ref 98–111)
Creatinine, Ser: 0.92 mg/dL (ref 0.44–1.00)
GFR calc Af Amer: >60 mL/min (ref >60)
GFR calc non Af Amer: 52 mL/min — ABNORMAL LOW (ref >60)
Glucose, Bld: 85 mg/dL (ref 70–99)
Potassium: 3.8 mmol/L (ref 3.5–5.1)
Sodium: 139 mmol/L (ref 135–145)

## 2019-07-22 LAB — CBC
HCT: 36.5 % (ref 36.0–46.0)
Hemoglobin: 11.6 g/dL — ABNORMAL LOW (ref 12.0–15.0)
MCH: 28.9 pg (ref 26.0–34.0)
MCHC: 31.8 g/dL (ref 30.0–36.0)
MCV: 91 fL (ref 80.0–100.0)
Platelets: 201 10*3/uL (ref 150–400)
RBC: 4.01 MIL/uL (ref 3.87–5.11)
RDW: 13.8 % (ref 11.5–15.5)
WBC: 3.5 10*3/uL — ABNORMAL LOW (ref 4.0–10.5)
nRBC: 0 % (ref 0.0–0.2)

## 2019-07-22 LAB — MAGNESIUM: Magnesium: 1.7 mg/dL (ref 1.7–2.4)

## 2019-07-22 MED ORDER — FUROSEMIDE 10 MG/ML IJ SOLN
40.0000 mg | Freq: Once | INTRAMUSCULAR | Status: AC
  Administered 2019-07-22: 40 mg via INTRAVENOUS
  Filled 2019-07-22: qty 4

## 2019-07-22 MED ORDER — METOPROLOL TARTRATE 25 MG PO TABS
12.5000 mg | ORAL_TABLET | Freq: Two times a day (BID) | ORAL | Status: DC
  Administered 2019-07-23 – 2019-07-25 (×4): 12.5 mg via ORAL
  Filled 2019-07-22 (×5): qty 1

## 2019-07-22 MED ORDER — SODIUM CHLORIDE 0.9 % IV SOLN: INTRAVENOUS | Status: DC | PRN

## 2019-07-22 NOTE — Progress Notes (Signed)
PROGRESS NOTE    Taylor Watson  WIO:[id number] DOB: July 06, 1918 DOA: 07/19/2019 PCP: [name], MD    Assessment & Plan:   Principal Problem:   Acute respiratory failure with hypoxia ([place]) Active Problems:   History of CVA (cerebrovascular accident)   Essential hypertension, benign   Unsteady gait   GERD (gastroesophageal reflux disease)   SOB (shortness of breath)   Hypokalemia   Volume overload   Acute exacerbation of CHF (congestive heart failure) (HCC)   Swelling of lower extremity   Atrial fibrillation with rapid ventricular response ([place])   Fall at home, initial encounter    Taylor Watson is a 86 y.o. AA female with medical history significant of HTN, depression, CVA who presented for weakness and falls.    # Acute hypoxic respiratory failure and BLE swelling due to volume overload # Acute diastolic CHF exacerbation --On EMS arrival, pt 86% on 2L.   BNP 621. CXR showed "new bibasilar opacification right worse than left likely small effusions with associated atelectasis."   --TTE showed normal LVEF, grade I diastolic dysfunction. --Walk test today 3/21 showed pt needed 3L to maintain 92-93% while walking. PLAN: --continue IV lasix 40 mg x2 today --Strict I/O --wean O2 as able   # Afib w RVR, RVR resolved --No known prior hx.  Maybe brought on by CHF exacerbation, or vice versa. --Started dilt 30 mg q6h with good rate control, however, HR became too low (in 40's-50's) after converting to Cardizem CD 120 mg daily. PLAN: --Switch to metop 12.5 BID starting tomorrow --No anticoagulation due to hx of bleeding (discussed with family) --Monitor on tele  # Fall due to weakness --PT/OT rec [facility]  # HTN --continue home hydralazine --continue IV diuresis  # Hx of CVA --continue home ASA  # Depression --continue home Lexapro  # GERD --continue home PPI   DVT prophylaxis: Heparin SQ Code Status: DNR Discussed with pt and daughter on admission.   pt said she can't have chest compressions.  Daughter confirmed DNR. Family Communication:  Disposition Plan: Pt came from home, likely can return home.  PT rec HHPT.  May need 1-2 more days of IV diuresis, because pt still needs 3L supplemental O2 today (new requirement).   Subjective and Interval History:  Pt reported breathing fine.  No complaints.  No leg pain.  No fever, chest pain, abdominal pain, N/V/D, dysuria.   Objective: Vitals:   07/21/19 1514 07/21/19 2340 07/22/19 0526 07/22/19 0756  BP: 128/62 (!) 140/57 (!) 142/92 (!) 151/68  Pulse: 64 61 80 (!) 43  Resp: 18 17  18   Temp: 97.9 F (36.6 C) 98.3 F (36.8 C)  98.6 F (37 C)  TempSrc: Oral Oral  Oral  SpO2: 97% 100%  100%  Weight:      Height:        Intake/Output Summary (Last 24 hours) at 07/22/2019 1446 Last data filed at 07/22/2019 1325 Gross per 24 hour  Intake --  Output 3100 ml  Net -3100 ml   Filed Weights   07/19/19 1045  Weight: 52.2 kg    Examination:   Constitutional: NAD, alert, oriented to self and place HEENT: conjunctivae and lids normal, EOMI CV: irregular, no M,R,G. Distal pulses +2.  No cyanosis.   RESP: CTA B/L, normal respiratory effort, on 2L GI: +BS, NTND Extremities: 1+ pitting edema in BLE, improved since yesterday SKIN: warm, dry and intact Neuro: II - XII grossly intact.  Sensation intact   Data Reviewed:  I have personally reviewed following labs and imaging studies  CBC: Recent Labs  Lab 07/19/19 1053 07/20/19 0425 07/21/19 0638 07/22/19 0535  WBC 5.6 4.5 3.1* 3.5*  HGB 12.5 12.4 11.7* 11.6*  HCT 39.3 38.6 38.2 36.5  MCV 91.4 91.0 91.6 91.0  PLT 192 209 207 222   Basic Metabolic Panel: Recent Labs  Lab 07/19/19 1053 07/20/19 0425 07/21/19 0638 07/22/19 0535  NA 141 143 141 139  K 3.3* 3.3* 3.2* 3.8  CL 100 100 97* 91*  CO2 33* 34* 33* 36*  GLUCOSE 95 81 74 85  BUN 15 15 13 13   CREATININE 0.87 0.85 0.92 0.92  CALCIUM 8.9 8.7* 8.4* 8.7*  MG  --  2.0 1.8  1.7   GFR: Estimated Creatinine Clearance: 28.1 mL/min (by C-G formula based on SCr of 0.92 mg/dL). Liver Function Tests: Recent Labs  Lab 07/19/19 1053  AST 20  ALT 10  ALKPHOS 78  BILITOT 1.3*  PROT 6.5  ALBUMIN 3.4*   No results for input(s): LIPASE, AMYLASE in the last 168 hours. No results for input(s): AMMONIA in the last 168 hours. Coagulation Profile: No results for input(s): INR, PROTIME in the last 168 hours. Cardiac Enzymes: No results for input(s): CKTOTAL, CKMB, CKMBINDEX, TROPONINI in the last 168 hours. BNP (last 3 results) No results for input(s): PROBNP in the last 8760 hours. HbA1C: No results for input(s): HGBA1C in the last 72 hours. CBG: No results for input(s): GLUCAP in the last 168 hours. Lipid Profile: No results for input(s): CHOL, HDL, LDLCALC, TRIG, CHOLHDL, LDLDIRECT in the last 72 hours. Thyroid Function Tests: No results for input(s): TSH, T4TOTAL, FREET4, T3FREE, THYROIDAB in the last 72 hours. Anemia Panel: No results for input(s): VITAMINB12, FOLATE, FERRITIN, TIBC, IRON, RETICCTPCT in the last 72 hours. Sepsis Labs: Recent Labs  Lab 07/19/19 1053  PROCALCITON <0.10    Recent Results (from the past 240 hour(s))  SARS CORONAVIRUS 2 (TAT 6-24 HRS) Nasopharyngeal Nasopharyngeal Swab     Status: None   Collection Time: 07/19/19  6:52 PM   Specimen: Nasopharyngeal Swab  Result Value Ref Range Status   SARS Coronavirus 2 NEGATIVE NEGATIVE Final    Comment: (NOTE) SARS-CoV-2 target nucleic acids are NOT DETECTED. The SARS-CoV-2 RNA is generally detectable in upper and lower respiratory specimens during the acute phase of infection. Negative results do not preclude SARS-CoV-2 infection, do not rule out co-infections with other pathogens, and should not be used as the sole basis for treatment or other patient management decisions. Negative results must be combined with clinical observations, patient history, and epidemiological  information. The expected result is Negative. Fact Sheet for Patients: SugarRoll.be Fact Sheet for Healthcare Providers: https://www.woods-mathews.com/ This test is not yet approved or cleared by the [place] FDA and  has been authorized for detection and/or diagnosis of SARS-CoV-2 by FDA under an Emergency Use Authorization (EUA). This EUA will remain  in effect (meaning this test can be used) for the duration of the COVID-19 declaration under Section 56 4(b)(1) of the Act, 21 U.S.C. section 360bbb-3(b)(1), unless the authorization is terminated or revoked sooner. Performed at [facility], [place] [address] [place], [place] [address]       Radiology Studies: No results found.   Scheduled Meds: . aspirin EC  81 mg Oral Daily  . diltiazem  120 mg Oral Daily  . escitalopram  5 mg Oral Daily  . furosemide  40 mg Intravenous Once  . heparin  5,000  Units Subcutaneous Q8H  . hydrALAZINE  50 mg Oral TID  . pantoprazole  40 mg Oral Daily   Continuous Infusions:   LOS: 3 days     Enzo Bi, MD [facility] If 7PM-7AM, please contact night-coverage 07/22/2019, 2:46 PM

## 2019-07-22 NOTE — Progress Notes (Signed)
SATURATION QUALIFICATIONS: (This note is used to comply with regulatory documentation for home oxygen)  Patient Saturations on Room Air at Rest = 85-90%  Patient Saturations on Room Air while Ambulating = 78%  Patient Saturations on 3 Liters of oxygen while Ambulating = 92-93%  Please briefly explain why patient needs home oxygen: Patient's saturations drop while walking

## 2019-07-22 NOTE — Progress Notes (Signed)
Notified Dr. Billie Ruddy of 10 beat run of v-tach. No new orders.

## 2019-07-23 LAB — BASIC METABOLIC PANEL
Anion gap: 12 (ref 5–15)
BUN: 13 mg/dL (ref 8–23)
CO2: 36 mmol/L — ABNORMAL HIGH (ref 22–32)
Calcium: 8.7 mg/dL — ABNORMAL LOW (ref 8.9–10.3)
Chloride: 90 mmol/L — ABNORMAL LOW (ref 98–111)
Creatinine, Ser: 0.83 mg/dL (ref 0.44–1.00)
GFR calc Af Amer: >60 mL/min (ref >60)
GFR calc non Af Amer: 59 mL/min — ABNORMAL LOW (ref >60)
Glucose, Bld: 91 mg/dL (ref 70–99)
Potassium: 3.4 mmol/L — ABNORMAL LOW (ref 3.5–5.1)
Sodium: 138 mmol/L (ref 135–145)

## 2019-07-23 LAB — CBC
HCT: 39.8 % (ref 36.0–46.0)
Hemoglobin: 12.7 g/dL (ref 12.0–15.0)
MCH: 28.9 pg (ref 26.0–34.0)
MCHC: 31.9 g/dL (ref 30.0–36.0)
MCV: 90.7 fL (ref 80.0–100.0)
Platelets: 198 10*3/uL (ref 150–400)
RBC: 4.39 MIL/uL (ref 3.87–5.11)
RDW: 13.5 % (ref 11.5–15.5)
WBC: 3.3 10*3/uL — ABNORMAL LOW (ref 4.0–10.5)
nRBC: 0 % (ref 0.0–0.2)

## 2019-07-23 LAB — MAGNESIUM: Magnesium: 1.8 mg/dL (ref 1.7–2.4)

## 2019-07-23 MED ORDER — MAGNESIUM SULFATE 2 GM/50ML IV SOLN
2.0000 g | Freq: Once | INTRAVENOUS | Status: AC
  Administered 2019-07-23: 2 g via INTRAVENOUS
  Filled 2019-07-23: qty 50

## 2019-07-23 MED ORDER — POTASSIUM CHLORIDE CRYS ER 20 MEQ PO TBCR
40.0000 meq | EXTENDED_RELEASE_TABLET | Freq: Once | ORAL | Status: AC
  Administered 2019-07-23: 40 meq via ORAL
  Filled 2019-07-23: qty 2

## 2019-07-23 MED ORDER — FUROSEMIDE 10 MG/ML IJ SOLN
40.0000 mg | Freq: Two times a day (BID) | INTRAMUSCULAR | Status: DC
  Administered 2019-07-23 – 2019-07-25 (×5): 40 mg via INTRAVENOUS
  Filled 2019-07-23 (×5): qty 4

## 2019-07-23 NOTE — Progress Notes (Signed)
Physical Therapy Treatment Patient Details Name: Taylor Watson MRN: [id number] DOB: [birth year] Today's Date: 07/23/2019    History of Present Illness 86 y.o. female with medical history significant of hypertension, hyperlipidemia, stroke, GERD, depression, UTI, who presents with weakness, LE swelling, falls and shortness of breath.    PT Comments    Pt pleasant and motivated to participate during the session. Pt had 4-5 instances of LOB in standing today and was very unsteady when ambulating. Pt required frequent physical assistance to prevent from falling and likely would have fallen to the floor on several occasions if Pryor Curia was not present. Pt appeared to have difficulty grading movement in her LE's and occasionally would struggle to accept weight through her LE's as well.  Pt is unsafe to return to her prior living situation due to a very risk risk for falling. Pt will benefit from PT services in a SNF setting upon discharge to safely address deficits listed in patient problem list for decreased caregiver assistance and eventual return to PLOF.    Follow Up Recommendations  SNF     Equipment Recommendations  Rolling walker with 5" wheels    Recommendations for Other Services       Precautions / Restrictions Precautions Precautions: Fall Restrictions Weight Bearing Restrictions: No    Mobility  Bed Mobility Overal bed mobility: Needs Assistance Bed Mobility: Supine to Sit     Supine to sit: Min guard;HOB elevated Sit to supine: Modified independent (Device/Increase time)   General bed mobility comments: Extra time and effort but  Transfers Overall transfer level: Needs assistance Equipment used: Rolling walker (2 wheeled) Transfers: Sit to/from Stand Sit to Stand: Min guard;Max assist         General transfer comment: Pt first tried to stand-up with hands on walker and was unable to stand-up. With cueing to push from bed, pt was closer to standing up but had  a posterior LOB immediatley and returned to sitting EOB. Bed height was then elevated even more and pt was able to achieve standing but immediatley had a LOB to the L and required max assist. Once regaining her balance, pt was CGA in standing. When preforming stand to sit transfer, pt had very poor eccentric control and did not appear to make an attempt to slowly lower herself to the chair despite cueing to do so.   Ambulation/Gait Ambulation/Gait assistance: Max assist Gait Distance (Feet): 20 Feet Assistive device: Rolling walker (2 wheeled) Gait Pattern/deviations: Staggering left;Staggering right;Ataxic Gait velocity: decreased   General Gait Details: Pt had several instances of LOB when ambulating in the room that required max assist to prevent from falling. After the second instances of LOB, author switched from close CGA to min assist for safety and to prevent future LOB. Pt still had 2-3 additional instances of LOB but given that pt was min assist, pt did not require much additional physical assistance to prevent from falling because the amplitude of LOB was small. Pt appeared to have difficulty with foot placement and grading leg movement that appeared ataxic. Pt also had difficulty with RW management and sometimes picked up her RW and other times moved it sideways as she was walking forward   Stairs             Wheelchair Mobility    Modified Rankin (Stroke Patients Only)       Balance Overall balance assessment: Needs assistance Sitting-balance support: Feet supported;No upper extremity supported Sitting balance-Leahy Scale: Fair Sitting balance - Comments:  No visible difficulty maintaining upright sitting posture without any challenge   Standing balance support: Bilateral upper extremity supported;During functional activity Standing balance-Leahy Scale: Poor Standing balance comment: Very poor standing balance and had 4-5 instances of LOB that required physical  assistance while ambulating 20 feet                            Cognition Arousal/Alertness: Awake/alert Behavior During Therapy: WFL for tasks assessed/performed Overall Cognitive Status: Within Functional Limits for tasks assessed                                 General Comments: Cooperative and pleasant      Exercises Total Joint Exercises Ankle Circles/Pumps: AROM;Strengthening;Both;10 reps Quad Sets: AROM;Strengthening;Both;10 reps Heel Slides: AROM;Both;5 reps Long Arc Quad: AROM;Strengthening;Both;10 Theatre manager in Standing: AROM;Strengthening;Both;10 reps Other Exercises Other Exercises: sit to/from stand transfer training    General Comments       Pertinent Vitals/Pain Pain Assessment: No/denies pain    Home Living                      Prior Function            PT Goals (current goals can now be found in the care plan section) Progress towards PT goals: Goals downgraded-see care plan    Frequency    Min 2X/week      PT Plan Discharge plan needs to be updated    Co-evaluation              AM-PAC PT "6 Clicks" Mobility   Outcome Measure  Help needed turning from your back to your side while in a flat bed without using bedrails?: None Help needed moving from lying on your back to sitting on the side of a flat bed without using bedrails?: A Little Help needed moving to and from a bed to a chair (including a wheelchair)?: A Lot Help needed standing up from a chair using your arms (e.g., wheelchair or bedside chair)?: A Lot Help needed to walk in hospital room?: A Lot Help needed climbing 3-5 steps with a railing? : A Lot 6 Click Score: 15    End of Session Equipment Utilized During Treatment: Gait belt;Oxygen Activity Tolerance: Patient tolerated treatment well Patient left: with chair alarm set;with call bell/phone within reach;in chair(Call bell not working, NT notified) Nurse Communication: Mobility  status PT Visit Diagnosis: Muscle weakness (generalized) (M62.81);Difficulty in walking, not elsewhere classified (R26.2)     Time: 1055-1130 PT Time Calculation (min) (ACUTE ONLY): 35 min  Charges:                        Annabelle Harman, SPT 07/23/19 3:37 PM

## 2019-07-23 NOTE — NC FL2 (Signed)
[place] LEVEL OF CARE SCREENING TOOL     IDENTIFICATION  Patient Name: Taylor Watson Birthdate: 29-Jul-1918 Sex: female Admission Date (Current Location): 07/19/2019  [place] and [place] Number:  Engineering geologist and Address:  [facility], [address] [place], [place] [address]      Provider Number: [id number]  Attending Physician Name and Address:  Enzo Bi, MD  Relative Name and Phone Number:  [name] [phone]    Current Level of Care: Hospital Recommended Level of Care: [place] Prior Approval Number:    Date Approved/Denied:   PASRR Number: [id number]  Discharge Plan: SNF    Current Diagnoses: Patient Active Problem List   Diagnosis Date Noted  . Acute exacerbation of CHF (congestive heart failure) ([place]) 07/22/2019  . Acute respiratory failure with hypoxia ([place]) 07/22/2019  . Swelling of lower extremity 07/22/2019  . Atrial fibrillation with rapid ventricular response ([place]) 07/22/2019  . Fall at home, initial encounter 07/22/2019  . Volume overload 07/19/2019  . Hypertensive urgency 05/18/2019  . Acute metabolic encephalopathy 20/94/7096  . Elevated troponin 05/18/2019  . Hypokalemia 05/18/2019  . URI (upper respiratory infection) 08/26/2018  . Headache 07/17/2017  . SOB (shortness of breath) 03/28/2016  . Near syncope 03/01/2016  . Anemia 10/28/2015  . Lower GI bleed   . Goals of care, counseling/discussion   . Blood loss anemia 06/01/2015  . GERD (gastroesophageal reflux disease)   . Depression   . Unsteady gait 02/08/2015  . Schwannoma 10/07/2013  . Rectal bleeding 04/03/2013  . Light headedness 01/19/2013  . Loss of weight 10/22/2012  . Hypercholesterolemia 10/22/2012  . History of CVA (cerebrovascular accident) 10/22/2012  . Anxiety 10/22/2012  . Essential hypertension, benign 10/22/2012    Orientation RESPIRATION BLADDER Height & Weight     Self, Time, Situation,  Place  Normal Continent Weight: 52.2 kg Height:  5\' 5"  (165.1 cm)  BEHAVIORAL SYMPTOMS/MOOD NEUROLOGICAL BOWEL NUTRITION STATUS      Continent    AMBULATORY STATUS COMMUNICATION OF NEEDS Skin   Extensive Assist Verbally Normal                       Personal Care Assistance Level of Assistance  Bathing, Dressing Bathing Assistance: Limited assistance   Dressing Assistance: Limited assistance     Functional Limitations Info  Hearing   Hearing Info: Impaired      SPECIAL CARE FACTORS FREQUENCY  PT (By licensed PT), OT (By licensed OT)     PT Frequency: 5 times per week OT Frequency: 5 times per week            Contractures Contractures Info: Not present    Additional Factors Info  Allergies   Allergies Info: Dyazide Hydrochlorothiazide W-triamterene, Monopril Fosinopril, Toprol Xl Metoprolol Tartrate Verapamil           Current Medications (07/23/2019):  This is the current hospital active medication list Current Facility-Administered Medications  Medication Dose Route Frequency Provider Last Rate Last Admin  . acetaminophen (TYLENOL) tablet 1,000 mg  1,000 mg Oral Q8H PRN Enzo Bi, MD   1,000 mg at 07/20/19 1720  . alum & mag hydroxide-simeth (MAALOX/MYLANTA) 200-200-20 MG/5ML suspension 15 mL  15 mL Oral Q6H PRN Enzo Bi, MD      . aspirin EC tablet 81 mg  81 mg Oral Daily Enzo Bi, MD   81 mg at 07/23/19 0835  . calcium carbonate (TUMS - dosed in mg elemental  calcium) chewable tablet 200 mg of elemental calcium  1 tablet Oral TID PRN Enzo Bi, MD      . docusate sodium (COLACE) capsule 100 mg  100 mg Oral BID PRN Enzo Bi, MD      . escitalopram (LEXAPRO) tablet 5 mg  5 mg Oral Daily Enzo Bi, MD   5 mg at 07/23/19 0919  . furosemide (LASIX) injection 40 mg  40 mg Intravenous BID Enzo Bi, MD   40 mg at 07/23/19 1308  . guaiFENesin-dextromethorphan (ROBITUSSIN DM) 100-10 MG/5ML syrup 10 mL  10 mL Oral Q6H PRN Enzo Bi, MD      . heparin injection  5,000 Units  5,000 Units Subcutaneous Q8H Enzo Bi, MD   5,000 Units at 07/23/19 1418  . hydrALAZINE (APRESOLINE) injection 10 mg  10 mg Intravenous Q6H PRN Enzo Bi, MD   10 mg at 07/20/19 0944  . hydrALAZINE (APRESOLINE) tablet 50 mg  50 mg Oral TID Enzo Bi, MD   50 mg at 07/23/19 0835  . metoprolol tartrate (LOPRESSOR) tablet 12.5 mg  12.5 mg Oral BID Enzo Bi, MD   12.5 mg at 07/23/19 0836  . ondansetron (ZOFRAN) injection 4 mg  4 mg Intravenous Q6H PRN Enzo Bi, MD      . ondansetron (ZOFRAN-ODT) disintegrating tablet 4 mg  4 mg Oral Q8H PRN Enzo Bi, MD      . pantoprazole (PROTONIX) EC tablet 40 mg  40 mg Oral Daily Enzo Bi, MD   40 mg at 07/23/19 0834  . polyethylene glycol (MIRALAX / GLYCOLAX) packet 17 g  17 g Oral BID PRN Enzo Bi, MD         Discharge Medications: Please see discharge summary for a list of discharge medications.  Relevant Imaging Results:  Relevant Lab Results:   Additional Information SS# [id number]  Su Hilt, RN

## 2019-07-23 NOTE — Care Management Important Message (Signed)
Important Message  Patient Details  Name: Taylor Watson MRN: [id number] Date of Birth: 1918/05/05   Medicare Important Message Given:  Yes     Shelbie Ammons, RN 07/23/2019, 11:04 AM

## 2019-07-23 NOTE — Telephone Encounter (Signed)
Pt Taylor Watson called wanting to know if pt needed an appt for home health care since she has been in the hospital or if it could just be put in place once she is released

## 2019-07-23 NOTE — Progress Notes (Signed)
Occupational Therapy Treatment Patient Details Name: Taylor Watson MRN: [id number] DOB: 07-23-1918 Today's Date: 07/23/2019    History of present illness 86 y.o. female with medical history significant of hypertension, hyperlipidemia, stroke, GERD, depression, UTI, who presents with weakness, Le swelling, falls and shortness of breath.   OT comments  Patient sitting up in recliner upon entry and agreeable to therapy.  Patient performed simple grooming tasks while seated with set up/SPV.  Doffed 02 during grooming and remained >90%.  Performed UB dressing using lateral leaning/bridges with set up/SPV.  Performed sit<>stand with CGA and increased utilization of appropriate body mechanics this date.  Improved static standing noted using RW for UE support.  Patient would benefit from further occupational therapy intervention to address performance components outlined in evaluation.  Based on today's performance, continuing to recommend [facility] OT with 24/7 SPV.   Follow Up Recommendations  Home health OT;Supervision/Assistance - 24 hour    Equipment Recommendations       Recommendations for Other Services      Precautions / Restrictions Precautions Precautions: Fall Restrictions Weight Bearing Restrictions: No       Mobility Bed Mobility               General bed mobility comments: Patient seated in recliner upon entry.  Transfers Overall transfer level: Needs assistance Equipment used: Rolling walker (2 wheeled) Transfers: Sit to/from Stand Sit to Stand: Min guard         General transfer comment: Provided education on body mechanics, sequencing and hand placement for improved independence    Balance                                           ADL either performed or assessed with clinical judgement   ADL Overall ADL's : Needs assistance/impaired     Grooming: Wash/dry hands;Wash/dry face;Sitting;Supervision/safety;Set up           Upper  Body Dressing : Set up;Supervision/safety;Sitting                     General ADL Comments: VCs for safety with telemetry leads during UB dressing     Vision Patient Visual Report: No change from baseline     Perception     Praxis      Cognition Arousal/Alertness: Awake/alert Behavior During Therapy: WFL for tasks assessed/performed Overall Cognitive Status: Within Functional Limits for tasks assessed                                 General Comments: Cooperative and pleasant        Exercises Other Exercises Other Exercises: Performed simple grooming while seated in recliner with setup/SPV Other Exercises: Performed UB dressing while sitting and performing bridges/lateral leaning with SPV and cues for safety regarding telemetry leads Other Exercises: Performed sit<>stand from recliner using RW and CGA.  Improved body mechanics and strength noted.   Shoulder Instructions       General Comments Patient noted on 3L supplemental 02 this date.  Doffed 02 to complete grooming and stayed >90%.    Pertinent Vitals/ Pain       Pain Assessment: No/denies pain  Home Living  Prior Functioning/Environment              Frequency  Min 1X/week        Progress Toward Goals  OT Goals(current goals can now be found in the care plan section)  Progress towards OT goals: Progressing toward goals     Plan Discharge plan remains appropriate;Frequency remains appropriate    Co-evaluation                 AM-PAC OT "6 Clicks" Daily Activity     Outcome Measure   Help from another person eating meals?: None Help from another person taking care of personal grooming?: None Help from another person toileting, which includes using toliet, bedpan, or urinal?: A Little Help from another person bathing (including washing, rinsing, drying)?: A Lot Help from another person to put on and taking off  regular upper body clothing?: None Help from another person to put on and taking off regular lower body clothing?: A Lot 6 Click Score: 19    End of Session Equipment Utilized During Treatment: Gait belt;Rolling walker  OT Visit Diagnosis: Unsteadiness on feet (R26.81)   Activity Tolerance Patient tolerated treatment well;No increased pain   Patient Left in chair;with call bell/phone within reach;with chair alarm set   Nurse Communication Other (comment)(Telemetry box not on and pt needs new purewic.  Informed NT.)        Time: 0388-8280 OT Time Calculation (min): 19 min  Charges: OT General Charges $OT Visit: 1 Visit OT Treatments $Self Care/Home Management : 8-22 mins  Baldomero Lamy, MS, OTR/L 07/23/19, 12:01 PM

## 2019-07-23 NOTE — TOC Progression Note (Signed)
Transition of Care [facility]) - Progression Note    Patient Details  Name: Taylor Watson MRN: [id number] Date of Birth: 10-20-1918  Transition of Care [facility]) CM/SW [place], RN Phone Number: 07/23/2019, 4:03 PM  Clinical Narrative:    The patient stated that she realizes she need rehab, agrees to a bedsearch, will review beds once obtained FL2 PASSR and bedseqrch sent  Expected Discharge Plan: [place] Barriers to Discharge: Continued Medical Work up  Expected Discharge Plan and Services Expected Discharge Plan: [place]   Discharge Planning Services: CM Consult   Living arrangements for the past 2 months: Single Family Home                 DME Arranged: Walker rolling DME Agency: [employer] Date DME Agency Contacted: 07/20/19 Time DME Agency Contacted: 1006   [place]: PT Bureau Agency: [place] ([place]) Date [place]: 07/20/19 Time [place]: 1014 Representative spoke with at [place]: [place] ([place]) Interventions    Readmission Risk Interventions No flowsheet data found.

## 2019-07-23 NOTE — Progress Notes (Signed)
PROGRESS NOTE    Taylor Watson  WIO:[id number] DOB: 05/13/1918 DOA: 07/19/2019 PCP: [name], MD    Assessment & Plan:   Principal Problem:   Acute respiratory failure with hypoxia ([place]) Active Problems:   History of CVA (cerebrovascular accident)   Essential hypertension, benign   Unsteady gait   GERD (gastroesophageal reflux disease)   SOB (shortness of breath)   Hypokalemia   Volume overload   Acute exacerbation of CHF (congestive heart failure) (HCC)   Swelling of lower extremity   Atrial fibrillation with rapid ventricular response ([place])   Fall at home, initial encounter    Taylor Watson is a 86 y.o. AA female with medical history significant of HTN, depression, CVA who presented for weakness and falls.    # Acute hypoxic respiratory failure and BLE swelling due to volume overload # Acute diastolic CHF exacerbation --On EMS arrival, pt 84% on 2L.   BNP 621. CXR showed "new bibasilar opacification right worse than left likely small effusions with associated atelectasis."   --TTE showed normal LVEF, grade I diastolic dysfunction. --Walk test 3/21 showed pt needed 3L to maintain 92-93% while walking. PLAN: --continue IV lasix 40 mg x2 today --Strict I/O --wean O2 as able   # Afib w RVR, RVR resolved --No known prior hx.  Maybe brought on by CHF exacerbation, or vice versa. --Started dilt 30 mg q6h with good rate control, however, HR became too low (in 40's-50's) after converting to Cardizem CD 120 mg daily. PLAN: --Switch to metop 12.5 BID today --No anticoagulation due to hx of bleeding (discussed with family) --Monitor on tele  # Fall due to weakness --PT/OT rec HH  # HTN --continue home hydralazine --continue IV diuresis  # Hx of CVA --continue home ASA  # Depression --continue home Lexapro  # GERD --continue home PPI   DVT prophylaxis: Heparin SQ Code Status: DNR Discussed with pt and daughter on admission.  pt said she  can't have chest compressions.  Daughter confirmed DNR. Family Communication:  Disposition Plan: Pt came from home, likely can return home.  PT rec HHPT.  May need 1-2 more days of IV diuresis, because pt still needs 3L supplemental O2 on 3/21 (new requirement).   Subjective and Interval History:  No more leg pain or any pain.  No fever, N/V/D, dysuria, increased swelling.   Objective: Vitals:   07/22/19 2043 07/22/19 2347 07/23/19 0800 07/23/19 0835  BP: (!) 157/72 (!) 152/47 (!) 152/66   Pulse:  60 (!) 52 75  Resp:  16 16   Temp:  98.4 F (36.9 C) 98.3 F (36.8 C)   TempSrc:  Oral Oral   SpO2:  100% 100%   Weight:      Height:        Intake/Output Summary (Last 24 hours) at 07/23/2019 1614 Last data filed at 07/23/2019 1500 Gross per 24 hour  Intake 268.74 ml  Output 1301 ml  Net -1032.26 ml   Filed Weights   07/19/19 1045  Weight: 52.2 kg    Examination:   Constitutional: NAD, alert, oriented to self and place HEENT: conjunctivae and lids normal, EOMI CV: irregular, no M,R,G. Distal pulses +2.  No cyanosis.   RESP: CTA B/L, normal respiratory effort, on 3L GI: +BS, NTND Extremities: edema improved in BLE SKIN: warm, dry and intact Neuro: II - XII grossly intact.  Sensation intact   Data Reviewed: I have personally reviewed following labs and imaging studies  CBC: Recent Labs  Lab 07/19/19 1053 07/20/19 0425 07/21/19 0638 07/22/19 0535 07/23/19 0543  WBC 5.6 4.5 3.1* 3.5* 3.3*  HGB 12.5 12.4 11.7* 11.6* 12.7  HCT 39.3 38.6 38.2 36.5 39.8  MCV 91.4 91.0 91.6 91.0 90.7  PLT 192 209 207 201 097   Basic Metabolic Panel: Recent Labs  Lab 07/19/19 1053 07/20/19 0425 07/21/19 0638 07/22/19 0535 07/23/19 0543  NA 141 143 141 139 138  K 3.3* 3.3* 3.2* 3.8 3.4*  CL 100 100 97* 91* 90*  CO2 33* 34* 33* 36* 36*  GLUCOSE 95 81 74 85 91  BUN 15 15 13 13 13   CREATININE 0.87 0.85 0.92 0.92 0.83  CALCIUM 8.9 8.7* 8.4* 8.7* 8.7*  MG  --  2.0 1.8 1.7 1.8     GFR: Estimated Creatinine Clearance: 31.2 mL/min (by C-G formula based on SCr of 0.83 mg/dL). Liver Function Tests: Recent Labs  Lab 07/19/19 1053  AST 20  ALT 10  ALKPHOS 78  BILITOT 1.3*  PROT 6.5  ALBUMIN 3.4*   No results for input(s): LIPASE, AMYLASE in the last 168 hours. No results for input(s): AMMONIA in the last 168 hours. Coagulation Profile: No results for input(s): INR, PROTIME in the last 168 hours. Cardiac Enzymes: No results for input(s): CKTOTAL, CKMB, CKMBINDEX, TROPONINI in the last 168 hours. BNP (last 3 results) No results for input(s): PROBNP in the last 8760 hours. HbA1C: No results for input(s): HGBA1C in the last 72 hours. CBG: No results for input(s): GLUCAP in the last 168 hours. Lipid Profile: No results for input(s): CHOL, HDL, LDLCALC, TRIG, CHOLHDL, LDLDIRECT in the last 72 hours. Thyroid Function Tests: No results for input(s): TSH, T4TOTAL, FREET4, T3FREE, THYROIDAB in the last 72 hours. Anemia Panel: No results for input(s): VITAMINB12, FOLATE, FERRITIN, TIBC, IRON, RETICCTPCT in the last 72 hours. Sepsis Labs: Recent Labs  Lab 07/19/19 1053  PROCALCITON <0.10    Recent Results (from the past 240 hour(s))  SARS CORONAVIRUS 2 (TAT 6-24 HRS) Nasopharyngeal Nasopharyngeal Swab     Status: None   Collection Time: 07/19/19  6:52 PM   Specimen: Nasopharyngeal Swab  Result Value Ref Range Status   SARS Coronavirus 2 NEGATIVE NEGATIVE Final    Comment: (NOTE) SARS-CoV-2 target nucleic acids are NOT DETECTED. The SARS-CoV-2 RNA is generally detectable in upper and lower respiratory specimens during the acute phase of infection. Negative results do not preclude SARS-CoV-2 infection, do not rule out co-infections with other pathogens, and should not be used as the sole basis for treatment or other patient management decisions. Negative results must be combined with clinical observations, patient history, and epidemiological information.  The expected result is Negative. Fact Sheet for Patients: SugarRoll.be Fact Sheet for Healthcare Providers: https://www.woods-mathews.com/ This test is not yet approved or cleared by the [place] FDA and  has been authorized for detection and/or diagnosis of SARS-CoV-2 by FDA under an Emergency Use Authorization (EUA). This EUA will remain  in effect (meaning this test can be used) for the duration of the COVID-19 declaration under Section 56 4(b)(1) of the Act, 21 U.S.C. section 360bbb-3(b)(1), unless the authorization is terminated or revoked sooner. Performed at [facility], [place] [address], [place], [place] [address]       Radiology Studies: No results found.   Scheduled Meds: . aspirin EC  81 mg Oral Daily  . escitalopram  5 mg Oral Daily  . furosemide  40 mg Intravenous BID  . heparin  5,000 Units Subcutaneous Q8H  .  hydrALAZINE  50 mg Oral TID  . metoprolol tartrate  12.5 mg Oral BID  . pantoprazole  40 mg Oral Daily   Continuous Infusions:   LOS: 4 days     Enzo Bi, MD [facility] If 7PM-7AM, please contact night-coverage 07/23/2019, 4:14 PM

## 2019-07-23 NOTE — Telephone Encounter (Signed)
Spoke with patients daughter. Pt is still currently admitted to the hospital. Discharge has been undetermined. Advised that once patient was discharged someone would be calling to discuss needs since being home and an appt would be scheduled with Dr Nicki Reaper. If hospital does not order home health, can discuss at [facility].

## 2019-07-24 LAB — CBC
HCT: 41.1 % (ref 36.0–46.0)
Hemoglobin: 12.9 g/dL (ref 12.0–15.0)
MCH: 28.4 pg (ref 26.0–34.0)
MCHC: 31.4 g/dL (ref 30.0–36.0)
MCV: 90.5 fL (ref 80.0–100.0)
Platelets: 218 10*3/uL (ref 150–400)
RBC: 4.54 MIL/uL (ref 3.87–5.11)
RDW: 13.5 % (ref 11.5–15.5)
WBC: 3.2 10*3/uL — ABNORMAL LOW (ref 4.0–10.5)
nRBC: 0 % (ref 0.0–0.2)

## 2019-07-24 LAB — BASIC METABOLIC PANEL
Anion gap: 11 (ref 5–15)
BUN: 14 mg/dL (ref 8–23)
CO2: 39 mmol/L — ABNORMAL HIGH (ref 22–32)
Calcium: 8.6 mg/dL — ABNORMAL LOW (ref 8.9–10.3)
Chloride: 89 mmol/L — ABNORMAL LOW (ref 98–111)
Creatinine, Ser: 0.88 mg/dL (ref 0.44–1.00)
GFR calc Af Amer: >60 mL/min (ref >60)
GFR calc non Af Amer: 55 mL/min — ABNORMAL LOW (ref >60)
Glucose, Bld: 101 mg/dL — ABNORMAL HIGH (ref 70–99)
Potassium: 3.4 mmol/L — ABNORMAL LOW (ref 3.5–5.1)
Sodium: 139 mmol/L (ref 135–145)

## 2019-07-24 LAB — RESPIRATORY PANEL BY RT PCR (FLU A&B, COVID)
Influenza A by PCR: NEGATIVE
Influenza B by PCR: NEGATIVE
SARS Coronavirus 2 by RT PCR: NEGATIVE

## 2019-07-24 LAB — MAGNESIUM: Magnesium: 2.3 mg/dL (ref 1.7–2.4)

## 2019-07-24 MED ORDER — HYDRALAZINE HCL 25 MG PO TABS: 25.0000 mg | ORAL_TABLET | Freq: Three times a day (TID) | ORAL | Status: DC

## 2019-07-24 MED ORDER — POTASSIUM CHLORIDE CRYS ER 20 MEQ PO TBCR
40.0000 meq | EXTENDED_RELEASE_TABLET | Freq: Once | ORAL | Status: AC
  Administered 2019-07-24: 40 meq via ORAL
  Filled 2019-07-24: qty 2

## 2019-07-24 NOTE — Progress Notes (Signed)
Occupational Therapy Treatment Patient Details Name: Taylor Watson MRN: [id number] DOB: February 26, 1919 Today's Date: 07/24/2019    History of present illness 86 y.o. female with medical history significant of hypertension, hyperlipidemia, stroke, GERD, depression, UTI, who presents with weakness, LE swelling, falls and shortness of breath.   OT comments  Patient seen this afternoon for OT session.  Upon entry, patient was lying on side of bed with B LEs dangling off.  Patient in a deep sleep.  Patient easily awoken by therapist and realized she was at the edge of the bed.  Assisted patient with positioning to midline in bed to improve overall safety and comfort.  Bed alarm on and needs within reach.  Provided education on use of pillows for increased comfort and safety.  Patient verbalized understanding and politely declined further treatment due to lethargy.  Patient would benefit from continued occupational therapy services to address performance component deficits outlined in evaluation.  Upgrading follow up recommendation to SNF 24/7 SPV.   Follow Up Recommendations  SNF;Supervision/Assistance - 24 hour    Equipment Recommendations  Other (comment)(defer to next level of care)    Recommendations for Other Services      Precautions / Restrictions Precautions Precautions: Fall Restrictions Weight Bearing Restrictions: No       Mobility Bed Mobility Overal bed mobility: Needs Assistance Bed Mobility: Rolling Rolling: Min guard         General bed mobility comments: Patient found laying on side of bed without bed railing.  B LEs hanging off of edge.  Patient in deep sleep.  Awoke with ease assisted patient back to midline in bed with appropriate positioning.  Transfers Overall transfer level: Needs assistance                    Balance                                           ADL either performed or assessed with clinical judgement   ADL  Overall ADL's : Needs assistance/impaired                                             Vision Patient Visual Report: No change from baseline     Perception     Praxis      Cognition Arousal/Alertness: Awake/alert Behavior During Therapy: WFL for tasks assessed/performed Overall Cognitive Status: Within Functional Limits for tasks assessed                                          Exercises Other Exercises Other Exercises: Provided education on general safety and positioning in bed to prevent falls   Shoulder Instructions       General Comments      Pertinent Vitals/ Pain       Pain Assessment: No/denies pain  Home Living                                          Prior Functioning/Environment  Frequency  Min 1X/week        Progress Toward Goals  OT Goals(current goals can now be found in the care plan section)  Progress towards OT goals: OT to reassess next treatment     Plan Frequency remains appropriate;Discharge plan needs to be updated    Co-evaluation                 AM-PAC OT "6 Clicks" Daily Activity     Outcome Measure                    End of Session    OT Visit Diagnosis: Unsteadiness on feet (R26.81)   Activity Tolerance Patient tolerated treatment well;No increased pain   Patient Left in bed;with call bell/phone within reach;with bed alarm set   Nurse Communication          Time: 5361-4431 OT Time Calculation (min): 11 min  Charges: OT General Charges $OT Visit: 1 Visit OT Treatments $Therapeutic Activity: 8-22 mins  Baldomero Lamy, MS, OTR/L 07/24/19, 4:36 PM

## 2019-07-24 NOTE — Progress Notes (Signed)
PROGRESS NOTE    Taylor Watson  FBP:[id number] DOB: 06-22-1918 DOA: 07/19/2019 PCP: [name], MD    Assessment & Plan:   Principal Problem:   Acute respiratory failure with hypoxia ([place]) Active Problems:   History of CVA (cerebrovascular accident)   Essential hypertension, benign   Unsteady gait   GERD (gastroesophageal reflux disease)   SOB (shortness of breath)   Hypokalemia   Volume overload   Acute exacerbation of CHF (congestive heart failure) (HCC)   Swelling of lower extremity   Atrial fibrillation with rapid ventricular response ([place])   Fall at home, initial encounter    Taylor Watson is a 86 y.o. AA female with medical history significant of HTN, depression, CVA who presented for weakness and falls.    # Acute hypoxic respiratory failure and BLE swelling due to volume overload # Acute diastolic CHF exacerbation --On EMS arrival, pt 84% on 2L.   BNP 621. CXR showed "new bibasilar opacification right worse than left likely small effusions with associated atelectasis."   --TTE showed normal LVEF, grade I diastolic dysfunction. --Walk test 3/21 showed pt needed 3L to maintain 92-93% while walking.  On 3/22, O2 requirement down to 2L. --Has been receiving IV lasix 40 mg BID with neg 7L so far, and stable Cr. PLAN: --continue IV lasix 40 mg x2 until back to room air or Cr starts to inicrease --Strict I/O --wean O2 as able   # Afib w RVR, resolved --No known prior hx.  Maybe brought on by CHF exacerbation, or vice versa. --Started dilt 30 mg q6h with good rate control, however, HR became too low (in 40's-50's) after converting to Cardizem CD 120 mg daily, so switched to metop 12.5 mg BID.  Now HR in 50's and back to sinus. PLAN: --continue metop 12.5 BID  --No anticoagulation due to hx of bleeding (discussed with family) --Monitor on tele  # Fall due to weakness --PT/OT re-eval today and rec SNF rehab  # HTN --continue home  hydralazine --continue IV diuresis  # Hx of CVA --continue home ASA  # Depression --continue home Lexapro  # GERD --continue home PPI   DVT prophylaxis: Heparin SQ Code Status: DNR Discussed with pt and daughter on admission.  pt said she can't have chest compressions.  Daughter confirmed DNR. Family Communication:  Disposition Plan: Pt came from home.  PT initially rec HHPT but revised rec to SNF rehab today 3/23.  Pt still requires 2L O2, but is stable and ready for discharge to SNF rehab with suppl O2 when bed available.   Subjective and Interval History:  Pt was found to be more sleepy.  No complaints, no pain.  No fever, N/V/D, dysuria, increased swelling.  Had another O2 sat test with ambulation, and still required 2L.  PT re-eval now rec SNF rehab.   Objective: Vitals:   07/24/19 0954 07/24/19 1000 07/24/19 1300 07/24/19 1635  BP:    (!) 129/56  Pulse: 75   (!) 55  Resp:    16  Temp:    98 F (36.7 C)  TempSrc:    Oral  SpO2:    100%  Weight:  67.6 kg    Height:   5\' 5"  (1.651 m)     Intake/Output Summary (Last 24 hours) at 07/24/2019 1920 Last data filed at 07/24/2019 1433 Gross per 24 hour  Intake 360 ml  Output 1200 ml  Net -840 ml   Filed Weights   07/19/19 1045 07/24/19 1000  Weight: 52.2 kg 67.6 kg    Examination:   Constitutional: NAD, alert, oriented to self and place, more sleepy HEENT: conjunctivae and lids normal, EOMI CV: RRR, no M,R,G. Distal pulses +2.  No cyanosis.   RESP: CTA B/L, normal respiratory effort, on 2L GI: +BS, NTND Extremities: edema improved in BLE SKIN: warm, dry and intact Neuro: II - XII grossly intact.  Sensation intact   Data Reviewed: I have personally reviewed following labs and imaging studies  CBC: Recent Labs  Lab 07/20/19 0425 07/21/19 0638 07/22/19 0535 07/23/19 0543 07/24/19 0529  WBC 4.5 3.1* 3.5* 3.3* 3.2*  HGB 12.4 11.7* 11.6* 12.7 12.9  HCT 38.6 38.2 36.5 39.8 41.1  MCV 91.0 91.6 91.0  90.7 90.5  PLT 209 207 201 198 671   Basic Metabolic Panel: Recent Labs  Lab 07/20/19 0425 07/21/19 0638 07/22/19 0535 07/23/19 0543 07/24/19 0529  NA 143 141 139 138 139  K 3.3* 3.2* 3.8 3.4* 3.4*  CL 100 97* 91* 90* 89*  CO2 34* 33* 36* 36* 39*  GLUCOSE 81 74 85 91 101*  BUN 15 13 13 13 14   CREATININE 0.85 0.92 0.92 0.83 0.88  CALCIUM 8.7* 8.4* 8.7* 8.7* 8.6*  MG 2.0 1.8 1.7 1.8 2.3   GFR: Estimated Creatinine Clearance: 32.1 mL/min (by C-G formula based on SCr of 0.88 mg/dL). Liver Function Tests: Recent Labs  Lab 07/19/19 1053  AST 20  ALT 10  ALKPHOS 78  BILITOT 1.3*  PROT 6.5  ALBUMIN 3.4*   No results for input(s): LIPASE, AMYLASE in the last 168 hours. No results for input(s): AMMONIA in the last 168 hours. Coagulation Profile: No results for input(s): INR, PROTIME in the last 168 hours. Cardiac Enzymes: No results for input(s): CKTOTAL, CKMB, CKMBINDEX, TROPONINI in the last 168 hours. BNP (last 3 results) No results for input(s): PROBNP in the last 8760 hours. HbA1C: No results for input(s): HGBA1C in the last 72 hours. CBG: No results for input(s): GLUCAP in the last 168 hours. Lipid Profile: No results for input(s): CHOL, HDL, LDLCALC, TRIG, CHOLHDL, LDLDIRECT in the last 72 hours. Thyroid Function Tests: No results for input(s): TSH, T4TOTAL, FREET4, T3FREE, THYROIDAB in the last 72 hours. Anemia Panel: No results for input(s): VITAMINB12, FOLATE, FERRITIN, TIBC, IRON, RETICCTPCT in the last 72 hours. Sepsis Labs: Recent Labs  Lab 07/19/19 1053  PROCALCITON <0.10    Recent Results (from the past 240 hour(s))  SARS CORONAVIRUS 2 (TAT 6-24 HRS) Nasopharyngeal Nasopharyngeal Swab     Status: None   Collection Time: 07/19/19  6:52 PM   Specimen: Nasopharyngeal Swab  Result Value Ref Range Status   SARS Coronavirus 2 NEGATIVE NEGATIVE Final    Comment: (NOTE) SARS-CoV-2 target nucleic acids are NOT DETECTED. The SARS-CoV-2 RNA is generally  detectable in upper and lower respiratory specimens during the acute phase of infection. Negative results do not preclude SARS-CoV-2 infection, do not rule out co-infections with other pathogens, and should not be used as the sole basis for treatment or other patient management decisions. Negative results must be combined with clinical observations, patient history, and epidemiological information. The expected result is Negative. Fact Sheet for Patients: SugarRoll.be Fact Sheet for Healthcare Providers: https://www.woods-mathews.com/ This test is not yet approved or cleared by the [place] FDA and  has been authorized for detection and/or diagnosis of SARS-CoV-2 by FDA under an Emergency Use Authorization (EUA). This EUA will remain  in effect (meaning this test can be used) for the  duration of the COVID-19 declaration under Section 56 4(b)(1) of the Act, 21 U.S.C. section 360bbb-3(b)(1), unless the authorization is terminated or revoked sooner. Performed at [facility], [place] [address] [place], [place] [address]   Respiratory Panel by RT PCR (Flu A&B, Covid) - Nasopharyngeal Swab     Status: None   Collection Time: 07/24/19  2:20 PM   Specimen: Nasopharyngeal Swab  Result Value Ref Range Status   SARS Coronavirus 2 by RT PCR NEGATIVE NEGATIVE Final    Comment: (NOTE) SARS-CoV-2 target nucleic acids are NOT DETECTED. The SARS-CoV-2 RNA is generally detectable in upper respiratoy specimens during the acute phase of infection. The lowest concentration of SARS-CoV-2 viral copies this assay can detect is 131 copies/mL. A negative result does not preclude SARS-Cov-2 infection and should not be used as the sole basis for treatment or other patient management decisions. A negative result may occur with  improper specimen collection/handling, submission of specimen other than nasopharyngeal swab, presence of viral mutation(s) within  the areas targeted by this assay, and inadequate number of viral copies (<131 copies/mL). A negative result must be combined with clinical observations, patient history, and epidemiological information. The expected result is Negative. Fact Sheet for Patients:  PinkCheek.be Fact Sheet for Healthcare Providers:  GravelBags.it This test is not yet ap proved or cleared by the [place] FDA and  has been authorized for detection and/or diagnosis of SARS-CoV-2 by FDA under an Emergency Use Authorization (EUA). This EUA will remain  in effect (meaning this test can be used) for the duration of the COVID-19 declaration under Section 564(b)(1) of the Act, 21 U.S.C. section 360bbb-3(b)(1), unless the authorization is terminated or revoked sooner.    Influenza A by PCR NEGATIVE NEGATIVE Final   Influenza B by PCR NEGATIVE NEGATIVE Final    Comment: (NOTE) The Xpert Xpress SARS-CoV-2/FLU/RSV assay is intended as an aid in  the diagnosis of influenza from Nasopharyngeal swab specimens and  should not be used as a sole basis for treatment. Nasal washings and  aspirates are unacceptable for Xpert Xpress SARS-CoV-2/FLU/RSV  testing. Fact Sheet for Patients: PinkCheek.be Fact Sheet for Healthcare Providers: GravelBags.it This test is not yet approved or cleared by the [place] FDA and  has been authorized for detection and/or diagnosis of SARS-CoV-2 by  FDA under an Emergency Use Authorization (EUA). This EUA will remain  in effect (meaning this test can be used) for the duration of the  Covid-19 declaration under Section 564(b)(1) of the Act, 21  U.S.C. section 360bbb-3(b)(1), unless the authorization is  terminated or revoked. Performed at [facility], [address] [place], [place] [address]       Radiology Studies: No results found.   Scheduled  Meds: . aspirin EC  81 mg Oral Daily  . escitalopram  5 mg Oral Daily  . furosemide  40 mg Intravenous BID  . heparin  5,000 Units Subcutaneous Q8H  . metoprolol tartrate  12.5 mg Oral BID  . pantoprazole  40 mg Oral Daily   Continuous Infusions:   LOS: 5 days     Enzo Bi, MD [facility] If 7PM-7AM, please contact night-coverage 07/24/2019, 7:20 PM

## 2019-07-24 NOTE — TOC Progression Note (Signed)
Transition of Care [facility]) - Progression Note    Patient Details  Name: Taylor Watson MRN: [id number] Date of Birth: Jan 15, 1919  Transition [facility]) CM/SW [place], RN Phone Number: 07/24/2019, 12:53 PM  Clinical Narrative:     Patient to go to [place] called [employer] to request insurance auth to start today Ref number [id number] I faxed the clinical notes to [employer] at [phone]  Expected Discharge Plan: [place] Barriers to Discharge: Continued Medical Work up  Expected Discharge Plan and Services Expected Discharge Plan: [place]   Discharge Planning Services: CM Consult   Living arrangements for the past 2 months: Single Family Home                 DME Arranged: Gilford Rile rolling DME Agency: [employer] Date DME Agency Contacted: 07/20/19 Time DME Agency Contacted: [phone]   [place]: [place] ([place]) Date [place]: 07/20/19 Time [place]: 1014 Representative spoke with at [place]: [place] ([place]) Interventions    Readmission Risk Interventions No flowsheet data found.

## 2019-07-24 NOTE — TOC Progression Note (Signed)
Transition of Care [facility]) - Progression Note    Patient Details  Name: Taylor Watson MRN: [id number] Date of Birth: 11/08/1918  Transition of Care [facility]) CM/SW [place], RN Phone Number: 07/24/2019, 4:24 PM  Clinical Narrative:    [place] called with Josem Kaufmann approval ref number [id number] auth number [id number] Coordinator stacy Thurm next review date 3/25   Expected Discharge Plan: [place] Barriers to Discharge: Continued Medical Work up  Expected Discharge Plan and Services Expected Discharge Plan: [employer]   Discharge Planning Services: CM Consult   Living arrangements for the past 2 months: Single Family Home                 DME Arranged: Gilford Rile rolling DME Agency: [employer] Date DME Agency Contacted: 07/20/19 Time DME Agency Contacted: [phone]   [place]: [place] ([place]) Date [place]: 07/20/19 Time [place]: [phone] Representative spoke with at [place]: [place] ([place]) Interventions    Readmission Risk Interventions No flowsheet data found.

## 2019-07-24 NOTE — Progress Notes (Signed)
Patient ambulated around room with FWW Pulse 56-02 85% at rest placed on 2L patients pulse 60 and o2 95%

## 2019-07-25 DIAGNOSIS — Z8673 Personal history of transient ischemic attack (TIA), and cerebral infarction without residual deficits: Secondary | ICD-10-CM | POA: Diagnosis not present

## 2019-07-25 DIAGNOSIS — R0602 Shortness of breath: Secondary | ICD-10-CM | POA: Diagnosis not present

## 2019-07-25 DIAGNOSIS — Z87442 Personal history of urinary calculi: Secondary | ICD-10-CM | POA: Diagnosis not present

## 2019-07-25 DIAGNOSIS — I110 Hypertensive heart disease with heart failure: Secondary | ICD-10-CM | POA: Diagnosis not present

## 2019-07-25 DIAGNOSIS — I5031 Acute diastolic (congestive) heart failure: Secondary | ICD-10-CM | POA: Diagnosis not present

## 2019-07-25 DIAGNOSIS — Z8249 Family history of ischemic heart disease and other diseases of the circulatory system: Secondary | ICD-10-CM | POA: Diagnosis not present

## 2019-07-25 DIAGNOSIS — E7800 Pure hypercholesterolemia, unspecified: Secondary | ICD-10-CM | POA: Diagnosis not present

## 2019-07-25 DIAGNOSIS — Z79899 Other long term (current) drug therapy: Secondary | ICD-10-CM | POA: Diagnosis not present

## 2019-07-25 DIAGNOSIS — E8770 Fluid overload, unspecified: Secondary | ICD-10-CM | POA: Diagnosis not present

## 2019-07-25 DIAGNOSIS — M6281 Muscle weakness (generalized): Secondary | ICD-10-CM | POA: Diagnosis not present

## 2019-07-25 DIAGNOSIS — J9601 Acute respiratory failure with hypoxia: Secondary | ICD-10-CM | POA: Diagnosis not present

## 2019-07-25 DIAGNOSIS — Z7401 Bed confinement status: Secondary | ICD-10-CM | POA: Diagnosis not present

## 2019-07-25 DIAGNOSIS — I5032 Chronic diastolic (congestive) heart failure: Secondary | ICD-10-CM | POA: Diagnosis not present

## 2019-07-25 DIAGNOSIS — Z87891 Personal history of nicotine dependence: Secondary | ICD-10-CM | POA: Diagnosis not present

## 2019-07-25 DIAGNOSIS — E785 Hyperlipidemia, unspecified: Secondary | ICD-10-CM | POA: Diagnosis not present

## 2019-07-25 DIAGNOSIS — M2550 Pain in unspecified joint: Secondary | ICD-10-CM | POA: Diagnosis not present

## 2019-07-25 DIAGNOSIS — F329 Major depressive disorder, single episode, unspecified: Secondary | ICD-10-CM | POA: Diagnosis not present

## 2019-07-25 DIAGNOSIS — R600 Localized edema: Secondary | ICD-10-CM | POA: Diagnosis not present

## 2019-07-25 DIAGNOSIS — D649 Anemia, unspecified: Secondary | ICD-10-CM | POA: Diagnosis not present

## 2019-07-25 DIAGNOSIS — I4891 Unspecified atrial fibrillation: Secondary | ICD-10-CM | POA: Diagnosis not present

## 2019-07-25 DIAGNOSIS — R262 Difficulty in walking, not elsewhere classified: Secondary | ICD-10-CM | POA: Diagnosis not present

## 2019-07-25 DIAGNOSIS — J9611 Chronic respiratory failure with hypoxia: Secondary | ICD-10-CM | POA: Diagnosis not present

## 2019-07-25 DIAGNOSIS — Z9181 History of falling: Secondary | ICD-10-CM | POA: Diagnosis not present

## 2019-07-25 DIAGNOSIS — J9600 Acute respiratory failure, unspecified whether with hypoxia or hypercapnia: Secondary | ICD-10-CM | POA: Diagnosis not present

## 2019-07-25 DIAGNOSIS — I10 Essential (primary) hypertension: Secondary | ICD-10-CM | POA: Diagnosis not present

## 2019-07-25 DIAGNOSIS — Z7982 Long term (current) use of aspirin: Secondary | ICD-10-CM | POA: Diagnosis not present

## 2019-07-25 DIAGNOSIS — K219 Gastro-esophageal reflux disease without esophagitis: Secondary | ICD-10-CM | POA: Diagnosis not present

## 2019-07-25 LAB — BRAIN NATRIURETIC PEPTIDE: B Natriuretic Peptide: 220 pg/mL — ABNORMAL HIGH (ref 0.0–100.0)

## 2019-07-25 LAB — BASIC METABOLIC PANEL
Anion gap: 10 (ref 5–15)
BUN: 26 mg/dL — ABNORMAL HIGH (ref 8–23)
CO2: 39 mmol/L — ABNORMAL HIGH (ref 22–32)
Calcium: 8.7 mg/dL — ABNORMAL LOW (ref 8.9–10.3)
Chloride: 88 mmol/L — ABNORMAL LOW (ref 98–111)
Creatinine, Ser: 0.81 mg/dL (ref 0.44–1.00)
GFR calc Af Amer: >60 mL/min (ref >60)
GFR calc non Af Amer: >60 mL/min (ref >60)
Glucose, Bld: 92 mg/dL (ref 70–99)
Potassium: 3.9 mmol/L (ref 3.5–5.1)
Sodium: 137 mmol/L (ref 135–145)

## 2019-07-25 LAB — CBC
HCT: 38.2 % (ref 36.0–46.0)
Hemoglobin: 11.8 g/dL — ABNORMAL LOW (ref 12.0–15.0)
MCH: 28.4 pg (ref 26.0–34.0)
MCHC: 30.9 g/dL (ref 30.0–36.0)
MCV: 92 fL (ref 80.0–100.0)
Platelets: 225 10*3/uL (ref 150–400)
RBC: 4.15 MIL/uL (ref 3.87–5.11)
RDW: 13.4 % (ref 11.5–15.5)
WBC: 3.4 10*3/uL — ABNORMAL LOW (ref 4.0–10.5)
nRBC: 0 % (ref 0.0–0.2)

## 2019-07-25 LAB — MAGNESIUM: Magnesium: 2.2 mg/dL (ref 1.7–2.4)

## 2019-07-25 MED ORDER — GUAIFENESIN-DM 100-10 MG/5ML PO SYRP: 10.0000 mL | ORAL_SOLUTION | Freq: Four times a day (QID) | ORAL | 0 refills | Status: DC | PRN

## 2019-07-25 MED ORDER — FUROSEMIDE 40 MG PO TABS: 40.0000 mg | ORAL_TABLET | Freq: Every day | ORAL | 11 refills | Status: DC

## 2019-07-25 MED ORDER — CALCIUM CARBONATE ANTACID 500 MG PO CHEW: 1.0000 | CHEWABLE_TABLET | Freq: Three times a day (TID) | ORAL | Status: DC | PRN

## 2019-07-25 MED ORDER — DOCUSATE SODIUM 100 MG PO CAPS: 100.0000 mg | ORAL_CAPSULE | Freq: Two times a day (BID) | ORAL | 0 refills | Status: DC | PRN

## 2019-07-25 MED ORDER — ALUM & MAG HYDROXIDE-SIMETH 200-200-20 MG/5ML PO SUSP: 15.0000 mL | Freq: Four times a day (QID) | ORAL | 0 refills | Status: DC | PRN

## 2019-07-25 MED ORDER — METOPROLOL TARTRATE 25 MG PO TABS: 12.5000 mg | ORAL_TABLET | Freq: Two times a day (BID) | ORAL | Status: DC

## 2019-07-25 NOTE — Discharge Summary (Addendum)
Physician Discharge Summary   Taylor Watson  female DOB: [birth year]  URK:[id number]  PCP: [name], MD  Admit date: 07/19/2019 Discharge date:   Admitted From: home Disposition:  SNF  CODE STATUS: DNR  Discussed with pt and daughter on admission.  pt said she can't have chest compressions.  Daughter confirmed DNR.   Hospital Course:  For full details, please see H&P, progress notes, consult notes and ancillary notes.  Briefly,  Taylor Watson is a 86 y.o. AA female with medical history significant of HTN, depression, CVA who presented for weakness and falls.    # Acute hypoxic respiratory failure and BLE swelling due to volume overload # Acute diastolic CHF exacerbation On EMS arrival, pt 86% on 2L.  BNP 621.  CXR showed "new bibasilar opacification right worse than left likely small effusions with associated atelectasis."    TTE showed normal LVEF, grade I diastolic dysfunction.  Pt received IV lasix 40 mg BID with neg 7L , and stable Cr.  Walk test on 3/22 showed O2 requirement of 2L with ambulation.  Pt is discharged with PO Lasix 40 mg daily.  Will need outpatient follow up 1 week after discharge to check kidney function and electrolytes, O2 requirement, and to evaluate the need to continue diuretic therapy.  # Afib w RVR, resolved No known prior hx.  Maybe brought on by CHF exacerbation, or vice versa.  Pt was started on dilt 30 mg q6h initially with good rate control, however, HR became too low after converting to Cardizem CD 120 mg daily (in 40's), so switched to metop 12.5 mg BID.  Now HR in 50's and back to sinus.  No anticoagulation due to hx of bleeding (discussed with family).   # Fall due to weakness PT/OT re-eval on 07/24/19 and rec SNF rehab   # HTN Home hydralazine stopped when metoprolol started with good BP control. Recommend daily monitoring of BP for 7 days to evaluate level of control given medication changes I.e. addition of daily lasix and  changing hydralazine to metoprolol.    # Hx of CVA Continued home ASA   # Depression Continued home Lexapro   # GERD Continued home PPI  Attestation: Seen and examined independently.  All labs medical records current medication were reviewed, meds were adjusted. Patient deemed stable for discharge.  On oral diuretics. Plan of care disposition was discussed with APP in detail. Dr. Roger Shelter   Discharge Diagnoses:  Principal Problem:   Acute respiratory failure with hypoxia ([place]) Active Problems:   History of CVA (cerebrovascular accident)   Essential hypertension, benign   Unsteady gait   GERD (gastroesophageal reflux disease)   SOB (shortness of breath)   Hypokalemia   Volume overload   Acute exacerbation of CHF (congestive heart failure) (HCC)   Swelling of lower extremity   Atrial fibrillation with rapid ventricular response ([place])   Fall at home, initial encounter    Discharge Instructions:  Allergies as of 07/25/2019       Reactions   Dyazide [hydrochlorothiazide W-triamterene]    Monopril [fosinopril]    Toprol Xl [metoprolol Tartrate]    Verapamil         Medication List     STOP taking these medications    hydrALAZINE 50 MG tablet Commonly known as: APRESOLINE       TAKE these medications    alum & mag hydroxide-simeth [phone] MG/5ML suspension Commonly known as: MAALOX/MYLANTA Take 15 mLs by mouth every 6 (six)  hours as needed for indigestion or heartburn.   aspirin 81 MG EC tablet Take 1 tablet (81 mg total) by mouth daily.   calcium carbonate 500 MG chewable tablet Commonly known as: TUMS - dosed in mg elemental calcium Chew 1 tablet (200 mg of elemental calcium total) by mouth 3 (three) times daily as needed for indigestion or heartburn.   docusate sodium 100 MG capsule Commonly known as: COLACE Take 1 capsule (100 mg total) by mouth 2 (two) times daily as needed for mild constipation or moderate constipation.   escitalopram 10  MG tablet Commonly known as: LEXAPRO Take 0.5 tablets (5 mg total) by mouth daily.   furosemide 40 MG tablet Commonly known as: Lasix Take 1 tablet (40 mg total) by mouth daily. Start taking on: July 26, 2019   guaiFENesin-dextromethorphan 100-10 MG/5ML syrup Commonly known as: ROBITUSSIN DM Take 10 mLs by mouth every 6 (six) hours as needed for cough.   metoprolol tartrate 25 MG tablet Commonly known as: LOPRESSOR Take 0.5 tablets (12.5 mg total) by mouth 2 (two) times daily.   omeprazole 20 MG capsule Commonly known as: PRILOSEC Take 1 capsule (20 mg total) by mouth daily.        Contact information for after-discharge care     [facility] .   Service: Skilled Nursing Contact information: [place] [phone]                Allergies  Allergen Reactions   Dyazide [Hydrochlorothiazide W-Triamterene]    Monopril [Fosinopril]    Toprol Xl [Metoprolol Tartrate]    Verapamil      The results of significant diagnostics from this hospitalization (including imaging, microbiology, ancillary and laboratory) are listed below for reference.   Consultations:   Procedures/Studies: DG Chest Portable 1 View  Result Date: 07/19/2019 CLINICAL DATA:  Shortness of breath and weakness. EXAM: PORTABLE CHEST 1 VIEW COMPARISON:  05/17/2019 FINDINGS: Patient slightly rotated to the left. Lungs are adequately inflated demonstrate new bibasilar opacification right worse than left likely small effusions with associated atelectasis although infection in the lung bases is possible. Mild prominence of the central pulmonary vessels. Borderline cardiomegaly. Remainder of the exam is unchanged. IMPRESSION: New bibasilar opacification right worse than left likely small effusions with associated elect cysts as infection in the lung bases is possible. Electronically Signed   By: Marin Olp M.D.   On:  07/19/2019 12:39   ECHOCARDIOGRAM COMPLETE  Result Date: 07/19/2019    ECHOCARDIOGRAM REPORT   Patient Name:   Taylor Watson Date of Exam: 07/19/2019 Medical Rec #:  [id number]          Height:       65.0 in Accession #:    [id number]         Weight:       115.0 lb Date of Birth:  1919-03-18          BSA:          1.564 m Patient Age:    45 years           BP:           183/90 mmHg Patient Gender: F                  HR:           71 bpm. Exam Location:  [facility] Procedure: 2D Echo, Cardiac Doppler and Color Doppler  Indications:     CHF 428.0  History:         Patient has no prior history of Echocardiogram examinations.                  Risk Factors:Hypertension. CVA.  Sonographer:     Sherrie Sport RDCS (AE) Referring Phys:  [id number] Otila Kluver LAI Diagnosing Phys: Yolonda Kida MD IMPRESSIONS  1. Left ventricular ejection fraction, by estimation, is 55 to 60%. The left ventricle has normal function. The left ventricle has no regional wall motion abnormalities. There is moderate concentric left ventricular hypertrophy. Left ventricular diastolic parameters are consistent with Grade I diastolic dysfunction (impaired relaxation).  2. Right ventricular systolic function is normal. The right ventricular size is normal. There is severely elevated pulmonary artery systolic pressure.  3. The mitral valve is grossly normal. Mild mitral valve regurgitation.  4. The aortic valve is normal in structure. Aortic valve regurgitation is not visualized. FINDINGS  Left Ventricle: Left ventricular ejection fraction, by estimation, is 55 to 60%. The left ventricle has normal function. The left ventricle has no regional wall motion abnormalities. The left ventricular internal cavity size was normal in size. There is  moderate concentric left ventricular hypertrophy. Left ventricular diastolic parameters are consistent with Grade I diastolic dysfunction (impaired relaxation). Right Ventricle: The right ventricular size is normal. No  increase in right ventricular wall thickness. Right ventricular systolic function is normal. There is severely elevated pulmonary artery systolic pressure. The tricuspid regurgitant velocity is 4.11 m/s, and with an assumed right atrial pressure of 10 mmHg, the estimated right ventricular systolic pressure is 60.1 mmHg. Left Atrium: Left atrial size was normal in size. Right Atrium: Right atrial size was normal in size. Pericardium: There is no evidence of pericardial effusion. Mitral Valve: The mitral valve is grossly normal. There is mild thickening of the mitral valve leaflet(s). Mild mitral annular calcification. Mild mitral valve regurgitation. MV peak gradient, 12.2 mmHg. The mean mitral valve gradient is 5.0 mmHg. Tricuspid Valve: The tricuspid valve is normal in structure. Tricuspid valve regurgitation is mild. Aortic Valve: The aortic valve is normal in structure. Aortic valve regurgitation is not visualized. Aortic valve mean gradient measures 6.7 mmHg. Aortic valve peak gradient measures 11.7 mmHg. Aortic valve area, by VTI measures 1.68 cm. Pulmonic Valve: The pulmonic valve was normal in structure. Pulmonic valve regurgitation is not visualized. Aorta: The aortic root is normal in size and structure. IAS/Shunts: No atrial level shunt detected by color flow Doppler.  LEFT VENTRICLE PLAX 2D LVIDd:         2.72 cm  Diastology LVIDs:         1.92 cm  LV e' lateral:   4.03 cm/s LV PW:         1.25 cm  LV E/e' lateral: 22.6 LV IVS:        1.12 cm  LV e' medial:    3.37 cm/s LVOT diam:     2.00 cm  LV E/e' medial:  27.1 LV SV:         53 LV SV Index:   34 LVOT Area:     3.14 cm  RIGHT VENTRICLE RV Basal diam:  3.49 cm LEFT ATRIUM              Index       RIGHT ATRIUM           Index LA diam:        3.90 cm  2.49  cm/m  RA Area:     13.50 cm LA Vol (A2C):   113.0 ml 72.27 ml/m RA Volume:   31.00 ml  19.83 ml/m LA Vol (A4C):   87.0 ml  55.64 ml/m LA Biplane Vol: 103.0 ml 65.88 ml/m  AORTIC VALVE                     PULMONIC VALVE AV Area (Vmax):    1.59 cm     PV Vmax:        0.67 m/s AV Area (Vmean):   1.48 cm     PV Peak grad:   1.8 mmHg AV Area (VTI):     1.68 cm     RVOT Peak grad: 2 mmHg AV Vmax:           171.00 cm/s AV Vmean:          117.333 cm/s AV VTI:            0.318 m AV Peak Grad:      11.7 mmHg AV Mean Grad:      6.7 mmHg LVOT Vmax:         86.50 cm/s LVOT Vmean:        55.300 cm/s LVOT VTI:          0.170 m LVOT/AV VTI ratio: 0.53  AORTA Ao Root diam: 2.80 cm MITRAL VALVE                TRICUSPID VALVE MV Area (PHT): 2.50 cm     TR Peak grad:   67.6 mmHg MV Peak grad:  12.2 mmHg    TR Vmax:        411.00 cm/s MV Mean grad:  5.0 mmHg MV Vmax:       1.75 m/s     SHUNTS MV Vmean:      100.0 cm/s   Systemic VTI:  0.17 m MV Decel Time: 303 msec     Systemic Diam: 2.00 cm MV E velocity: 91.20 cm/s MV A velocity: 161.00 cm/s MV E/A ratio:  0.57 Dwayne D Callwood MD Electronically signed by Yolonda Kida MD Signature Date/Time: 07/19/2019/4:23:48 PM    Final       Labs: BNP (last 3 results) Recent Labs    07/19/19 1053 07/25/19 0557  BNP 621.0* 102.5*   Basic Metabolic Panel: Recent Labs  Lab 07/21/19 0638 07/22/19 0535 07/23/19 0543 07/24/19 0529 07/25/19 0557  NA 141 139 138 139 137  K 3.2* 3.8 3.4* 3.4* 3.9  CL 97* 91* 90* 89* 88*  CO2 33* 36* 36* 39* 39*  GLUCOSE 74 85 91 101* 92  BUN 13 13 13 14  26*  CREATININE 0.92 0.92 0.83 0.88 0.81  CALCIUM 8.4* 8.7* 8.7* 8.6* 8.7*  MG 1.8 1.7 1.8 2.3 2.2   Liver Function Tests: Recent Labs  Lab 07/19/19 1053  AST 20  ALT 10  ALKPHOS 78  BILITOT 1.3*  PROT 6.5  ALBUMIN 3.4*   No results for input(s): LIPASE, AMYLASE in the last 168 hours. No results for input(s): AMMONIA in the last 168 hours. CBC: Recent Labs  Lab 07/21/19 0638 07/22/19 0535 07/23/19 0543 07/24/19 0529 07/25/19 0557  WBC 3.1* 3.5* 3.3* 3.2* 3.4*  HGB 11.7* 11.6* 12.7 12.9 11.8*  HCT 38.2 36.5 39.8 41.1 38.2  MCV 91.6 91.0 90.7 90.5 92.0    PLT 207 201 198 218 225   Cardiac Enzymes: No results for input(s): CKTOTAL, CKMB, CKMBINDEX, TROPONINI in  the last 168 hours. BNP: Invalid input(s): POCBNP CBG: No results for input(s): GLUCAP in the last 168 hours. D-Dimer No results for input(s): DDIMER in the last 72 hours. Hgb A1c No results for input(s): HGBA1C in the last 72 hours. Lipid Profile No results for input(s): CHOL, HDL, LDLCALC, TRIG, CHOLHDL, LDLDIRECT in the last 72 hours. Thyroid function studies No results for input(s): TSH, T4TOTAL, T3FREE, THYROIDAB in the last 72 hours.  Invalid input(s): FREET3 Anemia work up No results for input(s): VITAMINB12, FOLATE, FERRITIN, TIBC, IRON, RETICCTPCT in the last 72 hours. Urinalysis    Component Value Date/Time   COLORURINE STRAW (A) 07/19/2019 1053   APPEARANCEUR CLEAR (A) 07/19/2019 1053   APPEARANCEUR Clear 09/21/2011 0059   LABSPEC 1.004 (L) 07/19/2019 1053   LABSPEC 1.005 09/21/2011 0059   PHURINE 7.0 07/19/2019 1053   GLUCOSEU NEGATIVE 07/19/2019 1053   GLUCOSEU Negative 09/21/2011 0059   HGBUR NEGATIVE 07/19/2019 1053   BILIRUBINUR NEGATIVE 07/19/2019 1053   BILIRUBINUR Negative 09/21/2011 0059   KETONESUR NEGATIVE 07/19/2019 1053   PROTEINUR NEGATIVE 07/19/2019 1053   NITRITE NEGATIVE 07/19/2019 1053   LEUKOCYTESUR NEGATIVE 07/19/2019 1053   LEUKOCYTESUR 1+ 09/21/2011 0059   Sepsis Labs Invalid input(s): PROCALCITONIN,  WBC,  LACTICIDVEN Microbiology Recent Results (from the past 240 hour(s))  SARS CORONAVIRUS 2 (TAT 6-24 HRS) Nasopharyngeal Nasopharyngeal Swab     Status: None   Collection Time: 07/19/19  6:52 PM   Specimen: Nasopharyngeal Swab  Result Value Ref Range Status   SARS Coronavirus 2 NEGATIVE NEGATIVE Final    Comment: (NOTE) SARS-CoV-2 target nucleic acids are NOT DETECTED. The SARS-CoV-2 RNA is generally detectable in upper and lower respiratory specimens during the acute phase of infection. Negative results do not preclude  SARS-CoV-2 infection, do not rule out co-infections with other pathogens, and should not be used as the sole basis for treatment or other patient management decisions. Negative results must be combined with clinical observations, patient history, and epidemiological information. The expected result is Negative. Fact Sheet for Patients: SugarRoll.be Fact Sheet for Healthcare Providers: https://www.woods-mathews.com/ This test is not yet approved or cleared by the [place] FDA and  has been authorized for detection and/or diagnosis of SARS-CoV-2 by FDA under an Emergency Use Authorization (EUA). This EUA will remain  in effect (meaning this test can be used) for the duration of the COVID-19 declaration under Section 56 4(b)(1) of the Act, 21 U.S.C. section 360bbb-3(b)(1), unless the authorization is terminated or revoked sooner. Performed at [facility], [place] [address], [place], [place] [address]   Respiratory Panel by RT PCR (Flu A&B, Covid) - Nasopharyngeal Swab     Status: None   Collection Time: 07/24/19  2:20 PM   Specimen: Nasopharyngeal Swab  Result Value Ref Range Status   SARS Coronavirus 2 by RT PCR NEGATIVE NEGATIVE Final    Comment: (NOTE) SARS-CoV-2 target nucleic acids are NOT DETECTED. The SARS-CoV-2 RNA is generally detectable in upper respiratoy specimens during the acute phase of infection. The lowest concentration of SARS-CoV-2 viral copies this assay can detect is 131 copies/mL. A negative result does not preclude SARS-Cov-2 infection and should not be used as the sole basis for treatment or other patient management decisions. A negative result may occur with  improper specimen collection/handling, submission of specimen other than nasopharyngeal swab, presence of viral mutation(s) within the areas targeted by this assay, and inadequate number of viral copies (<131 copies/mL). A negative result must be combined  with clinical  observations, patient history, and epidemiological information. The expected result is Negative. Fact Sheet for Patients:  PinkCheek.be Fact Sheet for Healthcare Providers:  GravelBags.it This test is not yet ap proved or cleared by the [place] FDA and  has been authorized for detection and/or diagnosis of SARS-CoV-2 by FDA under an Emergency Use Authorization (EUA). This EUA will remain  in effect (meaning this test can be used) for the duration of the COVID-19 declaration under Section 564(b)(1) of the Act, 21 U.S.C. section 360bbb-3(b)(1), unless the authorization is terminated or revoked sooner.    Influenza A by PCR NEGATIVE NEGATIVE Final   Influenza B by PCR NEGATIVE NEGATIVE Final    Comment: (NOTE) The Xpert Xpress SARS-CoV-2/FLU/RSV assay is intended as an aid in  the diagnosis of influenza from Nasopharyngeal swab specimens and  should not be used as a sole basis for treatment. Nasal washings and  aspirates are unacceptable for Xpert Xpress SARS-CoV-2/FLU/RSV  testing. Fact Sheet for Patients: PinkCheek.be Fact Sheet for Healthcare Providers: GravelBags.it This test is not yet approved or cleared by the [place] FDA and  has been authorized for detection and/or diagnosis of SARS-CoV-2 by  FDA under an Emergency Use Authorization (EUA). This EUA will remain  in effect (meaning this test can be used) for the duration of the  Covid-19 declaration under Section 564(b)(1) of the Act, 21  U.S.C. section 360bbb-3(b)(1), unless the authorization is  terminated or revoked. Performed at [facility], [address] [place], [place] [address]      Total time spend on discharging this patient, including the last patient exam, discussing the hospital stay, instructions for ongoing care as it relates to all pertinent caregivers, as  well as preparing the medical discharge records, prescriptions, and/or referrals as applicable, is 40 minutes.    [facility] M,NP  [facility] 07/25/2019, 10:50 AM  If 7PM-7AM, please contact night-coverage

## 2019-07-25 NOTE — Progress Notes (Signed)
Report given to Marcus Hook at [facility] at 1245. No further question once report was given.

## 2019-07-25 NOTE — TOC Transition Note (Signed)
Transition of Care [facility]) - CM/SW Discharge Note   Patient Details  Name: Taylor Watson MRN: [id number] Date of Birth: 1918-08-09  [facility] CM/SW Contact:  Su Hilt, RN Phone Number: 07/25/2019, 12:01 PM   Clinical Narrative:    Patient to discharge to [facility] room 8 B today via EMS transport, the bedside nurse to call report, EMS was called by [facility] to pick up, her sister [name] is aware   Final next level of care: [place] Barriers to Discharge: Continued Medical Work up   Patient Goals and CMS Choice Patient states their goals for this hospitalization and ongoing recovery are:: go home      Discharge Placement                       Discharge Plan and Services   Discharge Planning Services: CM Consult            DME Arranged: Gilford Rile rolling DME Agency: [employer] Date DME Agency Contacted: 07/20/19 Time DME Agency Contacted: 1006   [place]: PT [place]: [place] ([place]) Date [place]: 07/20/19 Time [place]: 1014 Representative spoke with at [place]: [place] ([place]) Interventions     Readmission Risk Interventions No flowsheet data found.

## 2019-07-25 NOTE — Progress Notes (Addendum)
D: Pt alert and oriented x 4. Pt denies experiencing any pain at this time.   A: Pt and care facility [facility] received discharge and medication education/information. Pt belongings were gathered and taken with pt upon discharge to [facility].   R: Pt and care facility [facility] verbalized understanding of discharge and medication education/information.  Pt transported via EMS to [facility]  Pt discharged at [phone]

## 2019-07-26 DIAGNOSIS — J9601 Acute respiratory failure with hypoxia: Secondary | ICD-10-CM | POA: Diagnosis not present

## 2019-07-26 DIAGNOSIS — E8770 Fluid overload, unspecified: Secondary | ICD-10-CM | POA: Diagnosis not present

## 2019-07-26 DIAGNOSIS — J9611 Chronic respiratory failure with hypoxia: Secondary | ICD-10-CM | POA: Diagnosis not present

## 2019-07-26 DIAGNOSIS — Z9181 History of falling: Secondary | ICD-10-CM | POA: Diagnosis not present

## 2019-07-30 ENCOUNTER — Telehealth: Payer: Self-pay | Admitting: Family

## 2019-07-30 NOTE — Telephone Encounter (Signed)
Spoke with nurse regarding Taylor Watson who stated patient is doing well. She is taking her meds and following a low sodium diet. They are doing daily weights and patient has no complaints. They confirmed her new patient CHF Clinic appointment with [place] on 4/5.     [name], [place]

## 2019-08-05 NOTE — Progress Notes (Signed)
Patient ID: Taylor Watson, female    DOB: [birth year], 86 y.o.   MRN: [id number]  HPI  Taylor Watson is a 86 y/o female with a history of hyperlipidemia, HTN, stroke, anemia, GERD, depression, previous tobacco use and chronic heart failure.   Echo report from 07/19/19 reviewed and showed an EF of 55-60% with moderate LVH, mild MR and severely elevated PA pressure.   Admitted 07/19/19 due to acute HF exacerbation. Received IV lasix initially with net loss of 7L. Transitioned to oral diuretics. Discharged after 6 days.   She presents today for her initial visit with a chief complaint of moderate fatigue with little exertion. She describes this as chronic in nature having been present for several years. She has associated shortness of breath and light-headedness along with this. She denies any difficulty sleeping, abdominal distention, palpitations, pedal edema, chest pain or cough. Unsure of what her weight is at the facility.   Past Medical History:  Diagnosis Date  . Allergy   . Anemia   . CHF (congestive heart failure) ([place])   . CVA (cerebral vascular accident) ([place])   . Depression   . GERD (gastroesophageal reflux disease)   . History of blood transfusion   . History of kidney stones   . Hx: UTI (urinary tract infection)   . Hypercholesterolemia   . Hypertension    Past Surgical History:  Procedure Laterality Date  . CHOLECYSTECTOMY  1984  . NECK LESION BIOPSY  2015   Followed by Dr. Richardson Landry   Family History  Problem Relation Age of Onset  . Cancer Mother        unknown type  . Hypertension Mother   . Cancer Father        unknown type  . Cancer Daughter   . Colon cancer Other        grandfather   Social History   Tobacco Use  . Smoking status: Former Research scientist (life sciences)  . Smokeless tobacco: Former Systems developer    Types: Chew  Substance Use Topics  . Alcohol use: No    Alcohol/week: 0.0 standard drinks   Allergies  Allergen Reactions  . Dyazide [Hydrochlorothiazide W-Triamterene]    . Monopril [Fosinopril]   . Toprol Xl [Metoprolol Tartrate]   . Verapamil    Prior to Admission medications   Medication Sig Start Date End Date Taking? Authorizing Provider  alum & mag hydroxide-simeth (MAALOX/MYLANTA) 200-200-20 MG/5ML suspension Take 15 mLs by mouth every 6 (six) hours as needed for indigestion or heartburn. 07/25/19  Yes Black, Lezlie Octave, NP  aspirin EC 81 MG EC tablet Take 1 tablet (81 mg total) by mouth daily. 05/20/19  Yes Lavina Hamman, MD  calcium carbonate (TUMS - DOSED IN MG ELEMENTAL CALCIUM) 500 MG chewable tablet Chew 1 tablet (200 mg of elemental calcium total) by mouth 3 (three) times daily as needed for indigestion or heartburn. 07/25/19  Yes Black, Lezlie Octave, NP  docusate sodium (COLACE) 100 MG capsule Take 1 capsule (100 mg total) by mouth 2 (two) times daily as needed for mild constipation or moderate constipation. 07/25/19  Yes Black, Lezlie Octave, NP  escitalopram (LEXAPRO) 10 MG tablet Take 0.5 tablets (5 mg total) by mouth daily. 06/19/19  Yes [name], MD  furosemide (LASIX) 40 MG tablet Take 1 tablet (40 mg total) by mouth daily. 07/26/19 07/25/20 Yes Black, Lezlie Octave, NP  guaiFENesin-dextromethorphan (ROBITUSSIN DM) 100-10 MG/5ML syrup Take 10 mLs by mouth every 6 (six) hours as needed for cough. 07/25/19  Yes Black, Lezlie Octave, NP  omeprazole (PRILOSEC) 20 MG capsule Take 1 capsule (20 mg total) by mouth daily. 04/16/19  Yes [name], MD  metoprolol tartrate (LOPRESSOR) 25 MG tablet Take 0.5 tablets (12.5 mg total) by mouth 2 (two) times daily.  07/25/19   Black, Lezlie Octave, NP    Review of Systems  Constitutional: Positive for fatigue. Negative for appetite change.  HENT: Negative for congestion, postnasal drip and sore throat.   Eyes: Negative.   Respiratory: Positive for shortness of breath. Negative for cough.   Cardiovascular: Negative for chest pain, palpitations and leg swelling.  Gastrointestinal: Negative for abdominal distention and abdominal  pain.  Endocrine: Negative.   Genitourinary: Negative.   Musculoskeletal: Negative for back pain and neck pain.  Skin: Negative.   Allergic/Immunologic: Negative.   Neurological: Positive for light-headedness. Negative for dizziness.  Hematological: Negative for adenopathy. Does not bruise/bleed easily.  Psychiatric/Behavioral: Negative for dysphoric mood and sleep disturbance (sleeping on 1 pillow). The patient is not nervous/anxious.    Vitals:   08/06/19 1030  BP: (!) 142/57  Pulse: (!) 57  Resp: 16  SpO2: 100%  Weight: 109 lb 6 oz (49.6 kg)  Height: 5\' 5"  (1.651 m)   Wt Readings from Last 3 Encounters:  08/06/19 109 lb 6 oz (49.6 kg)  07/24/19 149 lb (67.6 kg)  05/17/19 115 lb (52.2 kg)   Lab Results  Component Value Date   CREATININE 0.81 07/25/2019   CREATININE 0.88 07/24/2019   CREATININE 0.83 07/23/2019     Physical Exam Vitals and nursing note reviewed.  Constitutional:      Appearance: She is well-developed.  HENT:     Head: Normocephalic and atraumatic.  Neck:     Vascular: No JVD.  Cardiovascular:     Rate and Rhythm: Regular rhythm. Bradycardia present.  Pulmonary:     Effort: Pulmonary effort is normal. No respiratory distress.     Breath sounds: No wheezing or rales.  Musculoskeletal:     Cervical back: Normal range of motion and neck supple.     Right lower leg: No tenderness. No edema.     Left lower leg: No tenderness. No edema.  Skin:    General: Skin is warm and dry.  Neurological:     General: No focal deficit present.     Mental Status: She is alert and oriented to person, place, and time.  Psychiatric:        Mood and Affect: Mood normal.        Behavior: Behavior normal.     Assessment & Plan:  1: Chronic heart failure with preserved ejection fraction and structural changes of moderate LVH- - NYHA class III - euvolemic today - instructed to call for an overnight weight gain of >2 pounds or a weekly weight gain of >5 pounds -  not adding salt and her grandson says that they don't normally cook with salt when she's home - begin entresto 242/6mg  BID along with decreasing furosemide to 20mg  daily - check BMP at her next visit - BNP 07/25/19 was 220.0  2: HTN- - BP mildly elevated; adding entresto per above - seeing PCP at SNF; usually sees [name], MD - [facility] 07/25/19 reviewed and showed sodium 137, potassium 3.9, creatinine 0.81 and GFR >60    Facility medication list reviewed.   Return in 3 weeks or sooner for any questions/problems before then.

## 2019-08-06 ENCOUNTER — Ambulatory Visit: Payer: Medicare Other | Attending: Family | Admitting: Family

## 2019-08-06 ENCOUNTER — Encounter: Payer: Self-pay | Admitting: Family

## 2019-08-06 ENCOUNTER — Other Ambulatory Visit: Payer: Self-pay

## 2019-08-06 VITALS — BP 142/57 | HR 57 | Resp 16 | Ht 65.0 in | Wt 109.4 lb

## 2019-08-06 DIAGNOSIS — Z79899 Other long term (current) drug therapy: Secondary | ICD-10-CM | POA: Diagnosis not present

## 2019-08-06 DIAGNOSIS — Z87891 Personal history of nicotine dependence: Secondary | ICD-10-CM | POA: Insufficient documentation

## 2019-08-06 DIAGNOSIS — E785 Hyperlipidemia, unspecified: Secondary | ICD-10-CM | POA: Diagnosis not present

## 2019-08-06 DIAGNOSIS — I5032 Chronic diastolic (congestive) heart failure: Secondary | ICD-10-CM

## 2019-08-06 DIAGNOSIS — F329 Major depressive disorder, single episode, unspecified: Secondary | ICD-10-CM | POA: Diagnosis not present

## 2019-08-06 DIAGNOSIS — K219 Gastro-esophageal reflux disease without esophagitis: Secondary | ICD-10-CM | POA: Insufficient documentation

## 2019-08-06 DIAGNOSIS — Z8249 Family history of ischemic heart disease and other diseases of the circulatory system: Secondary | ICD-10-CM | POA: Diagnosis not present

## 2019-08-06 DIAGNOSIS — I110 Hypertensive heart disease with heart failure: Secondary | ICD-10-CM | POA: Diagnosis not present

## 2019-08-06 DIAGNOSIS — Z8673 Personal history of transient ischemic attack (TIA), and cerebral infarction without residual deficits: Secondary | ICD-10-CM | POA: Diagnosis not present

## 2019-08-06 DIAGNOSIS — Z7982 Long term (current) use of aspirin: Secondary | ICD-10-CM | POA: Insufficient documentation

## 2019-08-06 DIAGNOSIS — Z87442 Personal history of urinary calculi: Secondary | ICD-10-CM | POA: Diagnosis not present

## 2019-08-06 DIAGNOSIS — I10 Essential (primary) hypertension: Secondary | ICD-10-CM

## 2019-08-06 NOTE — Progress Notes (Signed)
[facility] - PHARMACIST COUNSELING NOTE  ADHERENCE ASSESSMENT  Adherence strategy: at a facility    Do you ever forget to take your medication? [] Yes (1) [x] No (0)  Do you ever skip doses due to side effects? [] Yes (1) [x] No (0)  Do you have trouble affording your medicines? [] Yes (1) [x] No (0)  Are you ever unable to pick up your medication due to transportation difficulties? [] Yes (1) [x] No (0)  Do you ever stop taking your medications because you don't believe they are helping? [] Yes (1) [x] No (0)  Total score _0______     Guideline-Directed Medical Therapy/Evidence Based Medicine    ACE/ARB/ARNI: None   Beta Blocker: Metoprolol tartrate 25 mg daily   Aldosterone Antagonist: None Diuretic: lasix 40 mg daily    SUBJECTIVE   HPI: New patient to clinic. Patient was admitted to [facility] on 3/18, who presented with weakness and falls.   Past Medical History:  Diagnosis Date  . Allergy   . Anemia   . CHF (congestive heart failure) ([place])   . CVA (cerebral vascular accident) ([place])   . Depression   . GERD (gastroesophageal reflux disease)   . History of blood transfusion   . History of kidney stones   . Hx: UTI (urinary tract infection)   . Hypercholesterolemia   . Hypertension         OBJECTIVE    Vital signs: HR 57, BP 142/57, weight (pounds) 109  ECHO: Date 07/19/2019, EF 55-60,   BMP Latest Ref Rng & Units 07/25/2019 07/24/2019 07/23/2019  Glucose 70 - 99 mg/dL 92 101(H) 91  BUN 8 - 23 mg/dL 26(H) 14 13  Creatinine 0.44 - 1.00 mg/dL 0.81 0.88 0.83  Sodium 135 - 145 mmol/L 137 139 138  Potassium 3.5 - 5.1 mmol/L 3.9 3.4(L) 3.4(L)  Chloride 98 - 111 mmol/L 88(L) 89(L) 90(L)  CO2 22 - 32 mmol/L 39(H) 39(H) 36(H)  Calcium 8.9 - 10.3 mg/dL 8.7(L) 8.6(L) 8.7(L)    ASSESSMENT It is unknown whether patient is receiving metoprolol or lasix at the facility. Informed patients and family member to stop taking hydralazine per  discharge summary.    PLAN Potentially add low dose losartan as blood pressure elevated. Creatinine and potassium within normal levels. May need to decrease lasix dose if patient not fluid overloaded.     Time spent: 20 minutes  Oswald Hillock, Pharm.D, BCPS Clinical Pharmacist 08/06/2019 11:21 AM    Current Outpatient Medications:  .  alum & mag hydroxide-simeth (MAALOX/MYLANTA) 200-200-20 MG/5ML suspension, Take 15 mLs by mouth every 6 (six) hours as needed for indigestion or heartburn., Disp: 355 mL, Rfl: 0 .  aspirin EC 81 MG EC tablet, Take 1 tablet (81 mg total) by mouth daily., Disp: 30 tablet, Rfl: 0 .  calcium carbonate (TUMS - DOSED IN MG ELEMENTAL CALCIUM) 500 MG chewable tablet, Chew 1 tablet (200 mg of elemental calcium total) by mouth 3 (three) times daily as needed for indigestion or heartburn., Disp:  , Rfl:  .  docusate sodium (COLACE) 100 MG capsule, Take 1 capsule (100 mg total) by mouth 2 (two) times daily as needed for mild constipation or moderate constipation., Disp: 10 capsule, Rfl: 0 .  escitalopram (LEXAPRO) 10 MG tablet, Take 0.5 tablets (5 mg total) by mouth daily., Disp: 45 tablet, Rfl: 1 .  furosemide (LASIX) 40 MG tablet, Take 1 tablet (40 mg total) by mouth daily., Disp: 30 tablet, Rfl: 11 .  guaiFENesin-dextromethorphan (ROBITUSSIN  DM) 100-10 MG/5ML syrup, Take 10 mLs by mouth every 6 (six) hours as needed for cough., Disp: 118 mL, Rfl: 0 .  metoprolol tartrate (LOPRESSOR) 25 MG tablet, Take 0.5 tablets (12.5 mg total) by mouth 2 (two) times daily., Disp:  , Rfl:  .  omeprazole (PRILOSEC) 20 MG capsule, Take 1 capsule (20 mg total) by mouth daily., Disp: 90 capsule, Rfl: 1   COUNSELING POINTS/CLINICAL PEARLS  Metoprolol Succinate (Goal: 200 mg once daily) Warn patient to avoid activities requiring mental alertness or coordination until drug effects are realized, as drug may cause dizziness. Tell patient planning major surgery with anesthesia to alert  physician that drug is being used, as drug impairs ability of heart to respond to reflex adrenergic stimuli. Drug may cause diarrhea, fatigue, headache, or depression. Advise diabetic patient to carefully monitor blood glucose as drug may mask symptoms of hypoglycemia. Patient should take extended-release tablet with or immediately following meals. Counsel patient against sudden discontinuation of drug, as this may precipitate hypertension, angina, or myocardial infarction. In the event of a missed dose, counsel patient to skip the missed dose and maintain a regular dosing schedule. Furosemide  Drug causes sun-sensitivity. Advise patient to use sunscreen and avoid tanning beds. Patient should avoid activities requiring coordination until drug effects are realized, as drug may cause dizziness, vertigo, or blurred vision. This drug may cause hyperglycemia, hyperuricemia, constipation, diarrhea, loss of appetite, nausea, vomiting, purpuric disorder, cramps, spasticity, asthenia, headache, paresthesia, or scaling eczema. Instruct patient to report unusual bleeding/bruising or signs/symptoms of hypotension, infection, pancreatitis, or ototoxicity (tinnitus, hearing impairment). Advise patient to report signs/symptoms of a severe skin reactions (flu-like symptoms, spreading red rash, or skin/mucous membrane blistering) or erythema multiforme. Instruct patient to eat high-potassium foods during drug therapy, as directed by healthcare professional.  Patient should not drink alcohol while taking this drug.   DRUGS TO AVOID IN HEART FAILURE  Drug or Class Mechanism  Analgesics . NSAIDs . COX-2 inhibitors . Glucocorticoids  Sodium and water retention, increased systemic vascular resistance, decreased response to diuretics   Diabetes Medications . Metformin . Thiazolidinediones o Rosiglitazone (Avandia) o Pioglitazone (Actos) . DPP4 Inhibitors o Saxagliptin (Onglyza) o Sitagliptin (Januvia)    Lactic acidosis Possible calcium channel blockade   Unknown  Antiarrhythmics . Class I  o Flecainide o Disopyramide . Class III o Sotalol . Other o Dronedarone  Negative inotrope, proarrhythmic   Proarrhythmic, beta blockade  Negative inotrope  Antihypertensives . Alpha Blockers o Doxazosin . Calcium Channel Blockers o Diltiazem o Verapamil o Nifedipine . Central Alpha Adrenergics o Moxonidine . Peripheral Vasodilators o Minoxidil  Increases renin and aldosterone  Negative inotrope    Possible sympathetic withdrawal  Unknown  Anti-infective . Itraconazole . Amphotericin B  Negative inotrope Unknown  Hematologic . Anagrelide . Cilostazol   Possible inhibition of PD IV Inhibition of PD III causing arrhythmias  Neurologic/Psychiatric . Stimulants . Anti-Seizure Drugs o Carbamazepine o Pregabalin . Antidepressants o Tricyclics o Citalopram . Parkinsons o Bromocriptine o Pergolide o Pramipexole . Antipsychotics o Clozapine . Antimigraine o Ergotamine o Methysergide . Appetite suppressants . Bipolar o Lithium  Peripheral alpha and beta agonist activity  Negative inotrope and chronotrope Calcium channel blockade  Negative inotrope, proarrhythmic Dose-dependent QT prolongation  Excessive serotonin activity/valvular damage Excessive serotonin activity/valvular damage Unknown  IgE mediated hypersensitivy, calcium channel blockade  Excessive serotonin activity/valvular damage Excessive serotonin activity/valvular damage Valvular damage  Direct myofibrillar degeneration, adrenergic stimulation  Antimalarials . Chloroquine . Hydroxychloroquine Intracellular inhibition of  lysosomal enzymes  Urologic Agents . Alpha Blockers o Doxazosin o Prazosin o Tamsulosin o Terazosin  Increased renin and aldosterone  Adapted from Page RL, et al. "Drugs That May Cause or Exacerbate Heart Failure: A Scientific Statement from the [place]." Circulation 2016; 938:H82-X93. DOI: 10.1161/CIR.0000000000000426   MEDICATION ADHERENCES TIPS AND STRATEGIES 1. Taking medication as prescribed improves patient outcomes in heart failure (reduces hospitalizations, improves symptoms, increases survival) 2. Side effects of medications can be managed by decreasing doses, switching agents, stopping drugs, or adding additional therapy. Please let someone in the [facility] know if you have having bothersome side effects so we can modify your regimen. Do not alter your medication regimen without talking to [place].  3. Medication reminders can help patients remember to take drugs on time. If you are missing or forgetting doses you can try linking behaviors, using pill boxes, or an electronic reminder like an alarm on your phone or an app. Some people can also get automated phone calls as medication reminders.

## 2019-08-06 NOTE — Patient Instructions (Signed)
Continue weighing daily and call for an overnight weight gain of > 2 pounds or a weekly weight gain of >5 pounds. 

## 2019-08-07 DIAGNOSIS — I4891 Unspecified atrial fibrillation: Secondary | ICD-10-CM | POA: Diagnosis not present

## 2019-08-07 DIAGNOSIS — J9611 Chronic respiratory failure with hypoxia: Secondary | ICD-10-CM | POA: Diagnosis not present

## 2019-08-07 DIAGNOSIS — M6281 Muscle weakness (generalized): Secondary | ICD-10-CM | POA: Diagnosis not present

## 2019-08-08 ENCOUNTER — Telehealth: Payer: Self-pay | Admitting: Internal Medicine

## 2019-08-08 NOTE — Telephone Encounter (Signed)
Patient called office back to see if office had an appointment for his mother. Patient's son stated he is tired of never able to get an appointment for his mother and that he thinks he will start looking for another doctor.

## 2019-08-08 NOTE — Telephone Encounter (Signed)
Taylor Watson called about scheduling an appt for swelling in legs for next week but there are no appts avail. He also had a question about seeing another Dr? He would like a call back today

## 2019-08-08 NOTE — Telephone Encounter (Signed)
Called patients son. Pt is admitted to SNF after being discharged from the hospital. Patient will be coming home Friday. Hospital recommended f/u with Dr Nicki Reaper since she has not been seen. Patient has been scheduled for 4/12. Advised son that she is being monitored by CHF clinic and swelling in her legs could be related. Saw them on 4/5 and has f/u on 4/23. Son stated that should take everything should be taken care of now. Apologized for inconvenience about scheduling appt. Pts son stated that the only other thing that may be needed is a wheelchair and home health if does not get sent home with her from SNF. Will f/u with pt and son at appt.

## 2019-08-10 ENCOUNTER — Telehealth: Payer: Self-pay | Admitting: Internal Medicine

## 2019-08-10 DIAGNOSIS — Z8673 Personal history of transient ischemic attack (TIA), and cerebral infarction without residual deficits: Secondary | ICD-10-CM | POA: Diagnosis not present

## 2019-08-10 DIAGNOSIS — Z9181 History of falling: Secondary | ICD-10-CM | POA: Diagnosis not present

## 2019-08-10 DIAGNOSIS — J9601 Acute respiratory failure with hypoxia: Secondary | ICD-10-CM | POA: Diagnosis not present

## 2019-08-10 DIAGNOSIS — J9611 Chronic respiratory failure with hypoxia: Secondary | ICD-10-CM | POA: Diagnosis not present

## 2019-08-10 DIAGNOSIS — I5031 Acute diastolic (congestive) heart failure: Secondary | ICD-10-CM | POA: Diagnosis not present

## 2019-08-10 NOTE — Telephone Encounter (Signed)
Pt's son called and states that the facility where she was at put her on Entresto but did not send any home with her. He also states that he was not given any discharge papers. He was going back up there to see if he could get those things from them? Please advise

## 2019-08-10 NOTE — Telephone Encounter (Signed)
LMTCB. Pt has appt on Monday

## 2019-08-12 DIAGNOSIS — E785 Hyperlipidemia, unspecified: Secondary | ICD-10-CM | POA: Diagnosis not present

## 2019-08-12 DIAGNOSIS — L89322 Pressure ulcer of left buttock, stage 2: Secondary | ICD-10-CM | POA: Diagnosis not present

## 2019-08-12 DIAGNOSIS — I5031 Acute diastolic (congestive) heart failure: Secondary | ICD-10-CM | POA: Diagnosis not present

## 2019-08-12 DIAGNOSIS — J9621 Acute and chronic respiratory failure with hypoxia: Secondary | ICD-10-CM | POA: Diagnosis not present

## 2019-08-12 DIAGNOSIS — Z9181 History of falling: Secondary | ICD-10-CM | POA: Diagnosis not present

## 2019-08-12 DIAGNOSIS — Z9981 Dependence on supplemental oxygen: Secondary | ICD-10-CM | POA: Diagnosis not present

## 2019-08-12 DIAGNOSIS — I110 Hypertensive heart disease with heart failure: Secondary | ICD-10-CM | POA: Diagnosis not present

## 2019-08-12 DIAGNOSIS — K219 Gastro-esophageal reflux disease without esophagitis: Secondary | ICD-10-CM | POA: Diagnosis not present

## 2019-08-12 DIAGNOSIS — J9611 Chronic respiratory failure with hypoxia: Secondary | ICD-10-CM | POA: Diagnosis not present

## 2019-08-12 DIAGNOSIS — Z8673 Personal history of transient ischemic attack (TIA), and cerebral infarction without residual deficits: Secondary | ICD-10-CM | POA: Diagnosis not present

## 2019-08-12 DIAGNOSIS — D649 Anemia, unspecified: Secondary | ICD-10-CM | POA: Diagnosis not present

## 2019-08-13 ENCOUNTER — Ambulatory Visit (INDEPENDENT_AMBULATORY_CARE_PROVIDER_SITE_OTHER): Payer: Medicare Other | Admitting: Internal Medicine

## 2019-08-13 ENCOUNTER — Encounter: Payer: Self-pay | Admitting: Internal Medicine

## 2019-08-13 ENCOUNTER — Telehealth: Payer: Self-pay | Admitting: Internal Medicine

## 2019-08-13 ENCOUNTER — Other Ambulatory Visit: Payer: Self-pay

## 2019-08-13 ENCOUNTER — Other Ambulatory Visit: Payer: Self-pay | Admitting: Internal Medicine

## 2019-08-13 DIAGNOSIS — J9601 Acute respiratory failure with hypoxia: Secondary | ICD-10-CM

## 2019-08-13 DIAGNOSIS — F419 Anxiety disorder, unspecified: Secondary | ICD-10-CM | POA: Diagnosis not present

## 2019-08-13 DIAGNOSIS — I4891 Unspecified atrial fibrillation: Secondary | ICD-10-CM | POA: Diagnosis not present

## 2019-08-13 DIAGNOSIS — D649 Anemia, unspecified: Secondary | ICD-10-CM

## 2019-08-13 DIAGNOSIS — L989 Disorder of the skin and subcutaneous tissue, unspecified: Secondary | ICD-10-CM

## 2019-08-13 DIAGNOSIS — D3610 Benign neoplasm of peripheral nerves and autonomic nervous system, unspecified: Secondary | ICD-10-CM

## 2019-08-13 DIAGNOSIS — I10 Essential (primary) hypertension: Secondary | ICD-10-CM

## 2019-08-13 DIAGNOSIS — E7800 Pure hypercholesterolemia, unspecified: Secondary | ICD-10-CM

## 2019-08-13 NOTE — Progress Notes (Signed)
Patient ID: Taylor Watson, female   DOB: 12-18-1918, 86 y.o.   MRN: [id number]   Subjective:    Patient ID: Taylor Watson, female    DOB: 02/16/1919, 86 y.o.   MRN: [id number]  HPI This visit occurred during the SARS-CoV-2 public health emergency.  Safety protocols were in place, including screening questions prior to the visit, additional usage of staff PPE, and extensive cleaning of exam room while observing appropriate contact time as indicated for disinfecting solutions.  Patient here for hospital follow and rehab follow up.  She is accompanied by her daughter.  History obtained from both of them.  She returned home from [facility] several days ago.  She was admitted 07/19/19 with acute hypoxic respiratory failure and bilateral lower extremity swelling.  CXR with new bibasilar opacification felt likely to be small effusions with associated atelectasis.  TTE showed normal LVEF, grade 1 diastolic dysfunction.  Received IV lasix with neg 7 liters and stable creatinine.  Walk test showed oxygen requirement of 2 liters with ambulation.  Discharge on oral lasix.  Was found to be in afib.  afib resolved.  Heart rate low on cardizem 120mg  and she was changed to metoprolol 12.5mg  bid.  Discharged in [place] on no anticoagulation secondary to history of bleeding.  Was discharged to [facility] for PT/OT.  Since discharge pts daughter reports - has been stable.  She does get up and move around some.  She is eating.  Discussed encouraging increased po intake.  Being followed at heart failure clinic.  Recently started entresto and lasix decreased to 20mg  q day.  (evaluated 08/06/19). Denies pain.     Past Medical History:  Diagnosis Date  . Allergy   . Anemia   . Arrhythmia   . CHF (congestive heart failure) ([place])   . CVA (cerebral vascular accident) ([place])   . Depression   . GERD (gastroesophageal reflux disease)   . History of blood transfusion   . History of kidney stones   . Hx: UTI  (urinary tract infection)   . Hypercholesterolemia   . Hypertension    Past Surgical History:  Procedure Laterality Date  . CHOLECYSTECTOMY  1984  . NECK LESION BIOPSY  2015   Followed by Dr. Richardson Landry   Family History  Problem Relation Age of Onset  . Cancer Mother        unknown type  . Hypertension Mother   . Cancer Father        unknown type  . Cancer Daughter   . Colon cancer Other        grandfather   Social History   Socioeconomic History  . Marital status: Widowed    Spouse name: Not on file  . Number of children: 5  . Years of education: Not on file  . Highest education level: Not on file  Occupational History  . Not on file  Tobacco Use  . Smoking status: Former Research scientist (life sciences)  . Smokeless tobacco: Former Systems developer    Types: Chew  Substance and Sexual Activity  . Alcohol use: No    Alcohol/week: 0.0 standard drinks  . Drug use: No  . Sexual activity: Not Currently  Other Topics Concern  . Not on file  Social History Narrative  . Not on file   Social Determinants of Health   Financial Resource Strain:   . Difficulty of Paying Living Expenses:   Food Insecurity:   . Worried About Charity fundraiser in the  Last Year:   . [place] in the Last Year:   Transportation Needs:   . Film/video editor (Medical):   Marland Kitchen Lack of Transportation (Non-Medical):   Physical Activity:   . Days of Exercise per Week:   . Minutes of Exercise per Session:   Stress:   . Feeling of Stress :   Social Connections:   . Frequency of Communication with Friends and Family:   . Frequency of Social Gatherings with Friends and Family:   . Attends Religious Services:   . Active Member of Clubs or Organizations:   . Attends Archivist Meetings:   Marland Kitchen Marital Status:     Outpatient Encounter Medications as of 08/13/2019  Medication Sig  . sacubitril-valsartan (ENTRESTO) 24-26 MG Take 1 tablet by mouth 2 (two) times daily.  Marland Kitchen alum & mag hydroxide-simeth (MAALOX/MYLANTA)  200-200-20 MG/5ML suspension Take 15 mLs by mouth every 6 (six) hours as needed for indigestion or heartburn.  Marland Kitchen aspirin EC 81 MG EC tablet Take 1 tablet (81 mg total) by mouth daily.  . calcium carbonate (TUMS - DOSED IN MG ELEMENTAL CALCIUM) 500 MG chewable tablet Chew 1 tablet (200 mg of elemental calcium total) by mouth 3 (three) times daily as needed for indigestion or heartburn.  . docusate sodium (COLACE) 100 MG capsule Take 1 capsule (100 mg total) by mouth 2 (two) times daily as needed for mild constipation or moderate constipation.  Marland Kitchen escitalopram (LEXAPRO) 10 MG tablet Take 0.5 tablets (5 mg total) by mouth daily.  . furosemide (LASIX) 40 MG tablet Take 1 tablet (40 mg total) by mouth daily. (Patient taking differently: Take 20 mg by mouth daily. )  . metoprolol tartrate (LOPRESSOR) 25 MG tablet Take 0.5 tablets (12.5 mg total) by mouth 2 (two) times daily.  . [DISCONTINUED] guaiFENesin-dextromethorphan (ROBITUSSIN DM) 100-10 MG/5ML syrup Take 10 mLs by mouth every 6 (six) hours as needed for cough.  . [DISCONTINUED] hydrALAZINE (APRESOLINE) 50 MG tablet TAKE 1 TABLET BY MOUTH THREE TIMES A DAY  . [DISCONTINUED] omeprazole (PRILOSEC) 20 MG capsule Take 1 capsule (20 mg total) by mouth daily.   No facility-administered encounter medications on file as of 08/13/2019.   Review of Systems  Constitutional: Negative for fever and unexpected weight change.  HENT: Negative for congestion and sinus pressure.   Respiratory: Negative for cough and chest tightness.        Breathing stable.   Cardiovascular: Negative for chest pain and palpitations.       Leg swelling improved.   Gastrointestinal: Negative for abdominal pain, diarrhea, nausea and vomiting.  Genitourinary: Negative for difficulty urinating and dysuria.  Musculoskeletal: Negative for joint swelling and myalgias.  Skin: Negative for color change and rash.  Neurological: Negative for dizziness, light-headedness and headaches.    Psychiatric/Behavioral: Negative for agitation and dysphoric mood.       Objective:    Physical Exam Constitutional:      General: She is not in acute distress.    Appearance: Normal appearance.  HENT:     Head: Normocephalic and atraumatic.     Right Ear: External ear normal.     Left Ear: External ear normal.  Eyes:     General: No scleral icterus.       Right eye: No discharge.        Left eye: No discharge.  Neck:     Thyroid: No thyromegaly.  Cardiovascular:     Rate and Rhythm: Normal rate and  regular rhythm.  Pulmonary:     Effort: No respiratory distress.     Breath sounds: Normal breath sounds. No wheezing.  Abdominal:     General: Bowel sounds are normal.     Palpations: Abdomen is soft.     Tenderness: There is no abdominal tenderness.  Musculoskeletal:        General: No swelling or tenderness.     Cervical back: Neck supple. No tenderness.  Lymphadenopathy:     Cervical: No cervical adenopathy.  Skin:    Findings: No erythema or rash.  Neurological:     Mental Status: She is alert.  Psychiatric:        Mood and Affect: Mood normal.        Behavior: Behavior normal.     BP (!) 150/80   Pulse (!) 57   Temp 97.6 F (36.4 C)   Resp 16   Wt 108 lb (49 kg)   SpO2 99%   BMI 17.97 kg/m  Wt Readings from Last 3 Encounters:  08/13/19 108 lb (49 kg)  08/06/19 109 lb 6 oz (49.6 kg)  07/24/19 149 lb (67.6 kg)     Lab Results  Component Value Date   WBC 3.4 (L) 07/25/2019   HGB 11.8 (L) 07/25/2019   HCT 38.2 07/25/2019   PLT 225 07/25/2019   GLUCOSE 92 07/25/2019   CHOL 236 (H) 05/19/2019   TRIG 83 05/19/2019   HDL 52 05/19/2019   LDLDIRECT 163.5 01/12/2013   LDLCALC 167 (H) 05/19/2019   ALT 10 07/19/2019   AST 20 07/19/2019   NA 137 07/25/2019   K 3.9 07/25/2019   CL 88 (L) 07/25/2019   CREATININE 0.81 07/25/2019   BUN 26 (H) 07/25/2019   CO2 39 (H) 07/25/2019   TSH 2.387 05/18/2019   INR 1.38 06/01/2015   HGBA1C 5.6 05/19/2019     DG Chest Portable 1 View  Result Date: 07/19/2019 CLINICAL DATA:  Shortness of breath and weakness. EXAM: PORTABLE CHEST 1 VIEW COMPARISON:  05/17/2019 FINDINGS: Patient slightly rotated to the left. Lungs are adequately inflated demonstrate new bibasilar opacification right worse than left likely small effusions with associated atelectasis although infection in the lung bases is possible. Mild prominence of the central pulmonary vessels. Borderline cardiomegaly. Remainder of the exam is unchanged. IMPRESSION: New bibasilar opacification right worse than left likely small effusions with associated elect cysts as infection in the lung bases is possible. Electronically Signed   By: Marin Olp M.D.   On: 07/19/2019 12:39   ECHOCARDIOGRAM COMPLETE  Result Date: 07/19/2019    ECHOCARDIOGRAM REPORT   Patient Name:   JONICA BICKHART Date of Exam: 07/19/2019 Medical Rec #:  [id number]          Height:       65.0 in Accession #:    [id number]         Weight:       115.0 lb Date of Birth:  10-27-1918          BSA:          1.564 m Patient Age:    45 years           BP:           183/90 mmHg Patient Gender: F                  HR:           71 bpm. Exam Location:  [facility] Procedure: 2D  Echo, Cardiac Doppler and Color Doppler Indications:     CHF 428.0  History:         Patient has no prior history of Echocardiogram examinations.                  Risk Factors:Hypertension. CVA.  Sonographer:     Sherrie Sport RDCS (AE) Referring Phys:  [id number] Otila Kluver LAI Diagnosing Phys: Yolonda Kida MD IMPRESSIONS  1. Left ventricular ejection fraction, by estimation, is 55 to 60%. The left ventricle has normal function. The left ventricle has no regional wall motion abnormalities. There is moderate concentric left ventricular hypertrophy. Left ventricular diastolic parameters are consistent with Grade I diastolic dysfunction (impaired relaxation).  2. Right ventricular systolic function is normal. The right ventricular size is  normal. There is severely elevated pulmonary artery systolic pressure.  3. The mitral valve is grossly normal. Mild mitral valve regurgitation.  4. The aortic valve is normal in structure. Aortic valve regurgitation is not visualized. FINDINGS  Left Ventricle: Left ventricular ejection fraction, by estimation, is 55 to 60%. The left ventricle has normal function. The left ventricle has no regional wall motion abnormalities. The left ventricular internal cavity size was normal in size. There is  moderate concentric left ventricular hypertrophy. Left ventricular diastolic parameters are consistent with Grade I diastolic dysfunction (impaired relaxation). Right Ventricle: The right ventricular size is normal. No increase in right ventricular wall thickness. Right ventricular systolic function is normal. There is severely elevated pulmonary artery systolic pressure. The tricuspid regurgitant velocity is 4.11 m/s, and with an assumed right atrial pressure of 10 mmHg, the estimated right ventricular systolic pressure is 50.0 mmHg. Left Atrium: Left atrial size was normal in size. Right Atrium: Right atrial size was normal in size. Pericardium: There is no evidence of pericardial effusion. Mitral Valve: The mitral valve is grossly normal. There is mild thickening of the mitral valve leaflet(s). Mild mitral annular calcification. Mild mitral valve regurgitation. MV peak gradient, 12.2 mmHg. The mean mitral valve gradient is 5.0 mmHg. Tricuspid Valve: The tricuspid valve is normal in structure. Tricuspid valve regurgitation is mild. Aortic Valve: The aortic valve is normal in structure. Aortic valve regurgitation is not visualized. Aortic valve mean gradient measures 6.7 mmHg. Aortic valve peak gradient measures 11.7 mmHg. Aortic valve area, by VTI measures 1.68 cm. Pulmonic Valve: The pulmonic valve was normal in structure. Pulmonic valve regurgitation is not visualized. Aorta: The aortic root is normal in size and  structure. IAS/Shunts: No atrial level shunt detected by color flow Doppler.  LEFT VENTRICLE PLAX 2D LVIDd:         2.72 cm  Diastology LVIDs:         1.92 cm  LV e' lateral:   4.03 cm/s LV PW:         1.25 cm  LV E/e' lateral: 22.6 LV IVS:        1.12 cm  LV e' medial:    3.37 cm/s LVOT diam:     2.00 cm  LV E/e' medial:  27.1 LV SV:         53 LV SV Index:   34 LVOT Area:     3.14 cm  RIGHT VENTRICLE RV Basal diam:  3.49 cm LEFT ATRIUM              Index       RIGHT ATRIUM           Index LA diam:  3.90 cm  2.49 cm/m  RA Area:     13.50 cm LA Vol (A2C):   113.0 ml 72.27 ml/m RA Volume:   31.00 ml  19.83 ml/m LA Vol (A4C):   87.0 ml  55.64 ml/m LA Biplane Vol: 103.0 ml 65.88 ml/m  AORTIC VALVE                    PULMONIC VALVE AV Area (Vmax):    1.59 cm     PV Vmax:        0.67 m/s AV Area (Vmean):   1.48 cm     PV Peak grad:   1.8 mmHg AV Area (VTI):     1.68 cm     RVOT Peak grad: 2 mmHg AV Vmax:           171.00 cm/s AV Vmean:          117.333 cm/s AV VTI:            0.318 m AV Peak Grad:      11.7 mmHg AV Mean Grad:      6.7 mmHg LVOT Vmax:         86.50 cm/s LVOT Vmean:        55.300 cm/s LVOT VTI:          0.170 m LVOT/AV VTI ratio: 0.53  AORTA Ao Root diam: 2.80 cm MITRAL VALVE                TRICUSPID VALVE MV Area (PHT): 2.50 cm     TR Peak grad:   67.6 mmHg MV Peak grad:  12.2 mmHg    TR Vmax:        411.00 cm/s MV Mean grad:  5.0 mmHg MV Vmax:       1.75 m/s     SHUNTS MV Vmean:      100.0 cm/s   Systemic VTI:  0.17 m MV Decel Time: 303 msec     Systemic Diam: 2.00 cm MV E velocity: 91.20 cm/s MV A velocity: 161.00 cm/s MV E/A ratio:  0.57 Dwayne D Callwood MD Electronically signed by Yolonda Kida MD Signature Date/Time: 07/19/2019/4:23:48 PM    Final        Assessment & Plan:   Problem List Items Addressed This Visit    Acute respiratory failure with hypoxia ([place])    Recently admitted with acute respiratory failure with hypoxia.  Discharged on [facility].  Diuresed.  Now being  followed at heart failure clinic.  Breathing stable.  Just recently started entresto.  Lasix decreased to 20mg  q day. Has f/u scheduled with Darylene Price.  Will plan for f/u metabolic panel at that visit.  Continue to monitor daily weights.        Anemia    hgb 07/25/19 - 11.8.        Anxiety    Stable on lexapro.  Follow.        Atrial fibrillation with rapid ventricular response (HCC)    Was found to be in afib during her hospitalization.  Converted to SR.  Hold on anticoagulation given her history of bleeding.  In SR now.  On metoprolol.  Follow.        Relevant Medications   sacubitril-valsartan (ENTRESTO) 24-26 MG   Essential hypertension, benign    Blood pressure on recheck improved (836O systolic).  Continue current medication regimen.  Follow pressure.  Follow metabolic panel.  Just recently started entresto.  On metoprolol.  Remain off hydralazine.  She is not taking.        Relevant Medications   sacubitril-valsartan (ENTRESTO) 24-26 MG   Hypercholesterolemia    Follow lipid panel.       Relevant Medications   sacubitril-valsartan (ENTRESTO) 24-26 MG   Schwannoma    Saw neurosurgery and ENT.  No further w/up warranted.  Follow.  Stable.       Skin lesion    Skin lesion - left buttock.  <1cm.  Dicussed trying to decrease pressure on the area.  Home health to follow.            [name], MD

## 2019-08-13 NOTE — Telephone Encounter (Signed)
Verbals given to dress Stage 2 wound to L inner buttock .9 cm x .4 cm. Using bordered foam patch. Referred cardiac nursing orders to CHF clinic. Estill Bamberg will reach out to them for those orders.

## 2019-08-13 NOTE — Telephone Encounter (Signed)
[facility] with [facility] called for Verbal orders for congestive heart failure   frequency   2 times a week for 3 weeks  1 time a week for 5 weeks

## 2019-08-14 ENCOUNTER — Telehealth: Payer: Self-pay | Admitting: Family

## 2019-08-14 DIAGNOSIS — D649 Anemia, unspecified: Secondary | ICD-10-CM | POA: Diagnosis not present

## 2019-08-14 DIAGNOSIS — Z8673 Personal history of transient ischemic attack (TIA), and cerebral infarction without residual deficits: Secondary | ICD-10-CM | POA: Diagnosis not present

## 2019-08-14 DIAGNOSIS — J9611 Chronic respiratory failure with hypoxia: Secondary | ICD-10-CM | POA: Diagnosis not present

## 2019-08-14 DIAGNOSIS — L89322 Pressure ulcer of left buttock, stage 2: Secondary | ICD-10-CM | POA: Diagnosis not present

## 2019-08-14 DIAGNOSIS — J9621 Acute and chronic respiratory failure with hypoxia: Secondary | ICD-10-CM | POA: Diagnosis not present

## 2019-08-14 DIAGNOSIS — Z9981 Dependence on supplemental oxygen: Secondary | ICD-10-CM | POA: Diagnosis not present

## 2019-08-14 DIAGNOSIS — I110 Hypertensive heart disease with heart failure: Secondary | ICD-10-CM | POA: Diagnosis not present

## 2019-08-14 DIAGNOSIS — E785 Hyperlipidemia, unspecified: Secondary | ICD-10-CM | POA: Diagnosis not present

## 2019-08-14 DIAGNOSIS — K219 Gastro-esophageal reflux disease without esophagitis: Secondary | ICD-10-CM | POA: Diagnosis not present

## 2019-08-14 DIAGNOSIS — I5031 Acute diastolic (congestive) heart failure: Secondary | ICD-10-CM | POA: Diagnosis not present

## 2019-08-14 DIAGNOSIS — Z9181 History of falling: Secondary | ICD-10-CM | POA: Diagnosis not present

## 2019-08-14 NOTE — Telephone Encounter (Signed)
Spoke with Estill Bamberg from [facility] who needed verbal orders to visit patient:  2 times/ week for 3 weeks and then 1 time/ week for 5 week  Verbal order given for this.

## 2019-08-15 ENCOUNTER — Telehealth: Payer: Self-pay | Admitting: Internal Medicine

## 2019-08-15 DIAGNOSIS — J9611 Chronic respiratory failure with hypoxia: Secondary | ICD-10-CM | POA: Diagnosis not present

## 2019-08-15 DIAGNOSIS — Z8673 Personal history of transient ischemic attack (TIA), and cerebral infarction without residual deficits: Secondary | ICD-10-CM | POA: Diagnosis not present

## 2019-08-15 DIAGNOSIS — L89322 Pressure ulcer of left buttock, stage 2: Secondary | ICD-10-CM | POA: Diagnosis not present

## 2019-08-15 DIAGNOSIS — D649 Anemia, unspecified: Secondary | ICD-10-CM | POA: Diagnosis not present

## 2019-08-15 DIAGNOSIS — K219 Gastro-esophageal reflux disease without esophagitis: Secondary | ICD-10-CM | POA: Diagnosis not present

## 2019-08-15 DIAGNOSIS — Z9181 History of falling: Secondary | ICD-10-CM | POA: Diagnosis not present

## 2019-08-15 DIAGNOSIS — Z9981 Dependence on supplemental oxygen: Secondary | ICD-10-CM | POA: Diagnosis not present

## 2019-08-15 DIAGNOSIS — I110 Hypertensive heart disease with heart failure: Secondary | ICD-10-CM | POA: Diagnosis not present

## 2019-08-15 DIAGNOSIS — E785 Hyperlipidemia, unspecified: Secondary | ICD-10-CM | POA: Diagnosis not present

## 2019-08-15 DIAGNOSIS — I5031 Acute diastolic (congestive) heart failure: Secondary | ICD-10-CM | POA: Diagnosis not present

## 2019-08-15 DIAGNOSIS — J9621 Acute and chronic respiratory failure with hypoxia: Secondary | ICD-10-CM | POA: Diagnosis not present

## 2019-08-15 NOTE — Telephone Encounter (Signed)
Lake Bells with [facility] called he needs verbal order for PT. He needs this to start Friday   Frequency  2 times a week for 2 weeks  1 time a week for  2 weeks

## 2019-08-16 DIAGNOSIS — I10 Essential (primary) hypertension: Secondary | ICD-10-CM | POA: Diagnosis not present

## 2019-08-16 DIAGNOSIS — E7800 Pure hypercholesterolemia, unspecified: Secondary | ICD-10-CM | POA: Diagnosis not present

## 2019-08-16 DIAGNOSIS — R0602 Shortness of breath: Secondary | ICD-10-CM | POA: Diagnosis not present

## 2019-08-16 NOTE — Telephone Encounter (Signed)
Ok to give verbal tried to check OV to confirm this was ordered per chart from NP Dyanne Carrel  On 07/25/19 ok to give verbal for PT.

## 2019-08-17 ENCOUNTER — Telehealth: Payer: Self-pay | Admitting: Internal Medicine

## 2019-08-17 NOTE — Telephone Encounter (Signed)
Left message to return call to office.

## 2019-08-17 NOTE — Telephone Encounter (Signed)
Taylor Watson with [facility] requested verbal order for OT for once a week for three weeks.

## 2019-08-18 DIAGNOSIS — J9621 Acute and chronic respiratory failure with hypoxia: Secondary | ICD-10-CM | POA: Diagnosis not present

## 2019-08-18 DIAGNOSIS — I5031 Acute diastolic (congestive) heart failure: Secondary | ICD-10-CM | POA: Diagnosis not present

## 2019-08-18 DIAGNOSIS — Z9981 Dependence on supplemental oxygen: Secondary | ICD-10-CM | POA: Diagnosis not present

## 2019-08-18 DIAGNOSIS — E785 Hyperlipidemia, unspecified: Secondary | ICD-10-CM | POA: Diagnosis not present

## 2019-08-18 DIAGNOSIS — Z8673 Personal history of transient ischemic attack (TIA), and cerebral infarction without residual deficits: Secondary | ICD-10-CM | POA: Diagnosis not present

## 2019-08-18 DIAGNOSIS — J9611 Chronic respiratory failure with hypoxia: Secondary | ICD-10-CM | POA: Diagnosis not present

## 2019-08-18 DIAGNOSIS — Z9181 History of falling: Secondary | ICD-10-CM | POA: Diagnosis not present

## 2019-08-18 DIAGNOSIS — L89322 Pressure ulcer of left buttock, stage 2: Secondary | ICD-10-CM | POA: Diagnosis not present

## 2019-08-18 DIAGNOSIS — I110 Hypertensive heart disease with heart failure: Secondary | ICD-10-CM | POA: Diagnosis not present

## 2019-08-18 DIAGNOSIS — K219 Gastro-esophageal reflux disease without esophagitis: Secondary | ICD-10-CM | POA: Diagnosis not present

## 2019-08-18 DIAGNOSIS — D649 Anemia, unspecified: Secondary | ICD-10-CM | POA: Diagnosis not present

## 2019-08-19 ENCOUNTER — Encounter: Payer: Self-pay | Admitting: Internal Medicine

## 2019-08-19 DIAGNOSIS — L989 Disorder of the skin and subcutaneous tissue, unspecified: Secondary | ICD-10-CM | POA: Insufficient documentation

## 2019-08-19 NOTE — Assessment & Plan Note (Signed)
Recently admitted with acute respiratory failure with hypoxia.  Discharged on [facility].  Diuresed.  Now being followed at heart failure clinic.  Breathing stable.  Just recently started entresto.  Lasix decreased to 20mg  q day. Has f/u scheduled with Darylene Price.  Will plan for f/u metabolic panel at that visit.  Continue to monitor daily weights.

## 2019-08-19 NOTE — Assessment & Plan Note (Signed)
Blood pressure on recheck improved (592T systolic).  Continue current medication regimen.  Follow pressure.  Follow metabolic panel.  Just recently started entresto.  On metoprolol.  Remain off hydralazine.  She is not taking.

## 2019-08-19 NOTE — Assessment & Plan Note (Signed)
Was found to be in afib during her hospitalization.  Converted to SR.  Hold on anticoagulation given her history of bleeding.  In SR now.  On metoprolol.  Follow.

## 2019-08-19 NOTE — Assessment & Plan Note (Signed)
Saw neurosurgery and ENT.  No further w/up warranted.  Follow.  Stable.

## 2019-08-19 NOTE — Assessment & Plan Note (Signed)
Follow lipid panel.   

## 2019-08-19 NOTE — Assessment & Plan Note (Signed)
Skin lesion - left buttock.  <1cm.  Dicussed trying to decrease pressure on the area.  Home health to follow.

## 2019-08-19 NOTE — Assessment & Plan Note (Signed)
hgb 07/25/19 - 11.8.

## 2019-08-19 NOTE — Assessment & Plan Note (Signed)
Stable on lexapro.  Follow.

## 2019-08-20 ENCOUNTER — Telehealth: Payer: Self-pay | Admitting: Internal Medicine

## 2019-08-20 NOTE — Telephone Encounter (Signed)
Tonya with [facility] called to report that the wound on pt  Bottom has healed

## 2019-08-20 NOTE — Telephone Encounter (Signed)
Thank you for the update.  Glad is healed.

## 2019-08-20 NOTE — Telephone Encounter (Signed)
FYI for you- you looked at this when she was here last week.

## 2019-08-20 NOTE — Telephone Encounter (Signed)
Noted  

## 2019-08-21 DIAGNOSIS — Z9181 History of falling: Secondary | ICD-10-CM | POA: Diagnosis not present

## 2019-08-21 DIAGNOSIS — Z9981 Dependence on supplemental oxygen: Secondary | ICD-10-CM | POA: Diagnosis not present

## 2019-08-21 DIAGNOSIS — Z8673 Personal history of transient ischemic attack (TIA), and cerebral infarction without residual deficits: Secondary | ICD-10-CM | POA: Diagnosis not present

## 2019-08-21 DIAGNOSIS — E785 Hyperlipidemia, unspecified: Secondary | ICD-10-CM | POA: Diagnosis not present

## 2019-08-21 DIAGNOSIS — K219 Gastro-esophageal reflux disease without esophagitis: Secondary | ICD-10-CM | POA: Diagnosis not present

## 2019-08-21 DIAGNOSIS — L89322 Pressure ulcer of left buttock, stage 2: Secondary | ICD-10-CM | POA: Diagnosis not present

## 2019-08-21 DIAGNOSIS — D649 Anemia, unspecified: Secondary | ICD-10-CM | POA: Diagnosis not present

## 2019-08-21 DIAGNOSIS — I5031 Acute diastolic (congestive) heart failure: Secondary | ICD-10-CM | POA: Diagnosis not present

## 2019-08-21 DIAGNOSIS — J9611 Chronic respiratory failure with hypoxia: Secondary | ICD-10-CM | POA: Diagnosis not present

## 2019-08-21 DIAGNOSIS — J9621 Acute and chronic respiratory failure with hypoxia: Secondary | ICD-10-CM | POA: Diagnosis not present

## 2019-08-21 DIAGNOSIS — I110 Hypertensive heart disease with heart failure: Secondary | ICD-10-CM | POA: Diagnosis not present

## 2019-08-22 NOTE — Telephone Encounter (Signed)
LMTCB

## 2019-08-22 NOTE — Telephone Encounter (Signed)
Verbals given for PT 

## 2019-08-22 NOTE — Telephone Encounter (Signed)
Lake Bells with [facility] called checking on  verbal orders below

## 2019-08-23 DIAGNOSIS — E785 Hyperlipidemia, unspecified: Secondary | ICD-10-CM | POA: Diagnosis not present

## 2019-08-23 DIAGNOSIS — J9611 Chronic respiratory failure with hypoxia: Secondary | ICD-10-CM | POA: Diagnosis not present

## 2019-08-23 DIAGNOSIS — Z9981 Dependence on supplemental oxygen: Secondary | ICD-10-CM | POA: Diagnosis not present

## 2019-08-23 DIAGNOSIS — K219 Gastro-esophageal reflux disease without esophagitis: Secondary | ICD-10-CM | POA: Diagnosis not present

## 2019-08-23 DIAGNOSIS — Z8673 Personal history of transient ischemic attack (TIA), and cerebral infarction without residual deficits: Secondary | ICD-10-CM | POA: Diagnosis not present

## 2019-08-23 DIAGNOSIS — L89322 Pressure ulcer of left buttock, stage 2: Secondary | ICD-10-CM | POA: Diagnosis not present

## 2019-08-23 DIAGNOSIS — J9621 Acute and chronic respiratory failure with hypoxia: Secondary | ICD-10-CM | POA: Diagnosis not present

## 2019-08-23 DIAGNOSIS — Z9181 History of falling: Secondary | ICD-10-CM | POA: Diagnosis not present

## 2019-08-23 DIAGNOSIS — D649 Anemia, unspecified: Secondary | ICD-10-CM | POA: Diagnosis not present

## 2019-08-23 DIAGNOSIS — I5031 Acute diastolic (congestive) heart failure: Secondary | ICD-10-CM | POA: Diagnosis not present

## 2019-08-23 DIAGNOSIS — I110 Hypertensive heart disease with heart failure: Secondary | ICD-10-CM | POA: Diagnosis not present

## 2019-08-23 NOTE — Telephone Encounter (Signed)
Dorthy Cooler, nurse for home health, stated that he would like to have a verbal to have a social worker come out. When he went out to evaluate, the deck on their trailer is not stable, about to fall down. He would like to have their social worker come out to discuss different assistance options available for them to have the deck fixed. Just need the ok from you.   [phone]

## 2019-08-23 NOTE — Telephone Encounter (Signed)
ok 

## 2019-08-23 NOTE — Telephone Encounter (Signed)
Verbals given to [place] for social work eval and OT 1x/wk for 3 weeks.

## 2019-08-27 NOTE — Progress Notes (Signed)
Patient ID: Taylor Watson, female    DOB: 1918-09-06, 86 y.o.   MRN: [id number]  HPI  [name] is a 86 y/o female with a history of hyperlipidemia, HTN, stroke, anemia, GERD, depression, previous tobacco use and chronic heart failure.   Echo report from 07/19/19 reviewed and showed an EF of 55-60% with moderate LVH, mild MR and severely elevated PA pressure.   Admitted 07/19/19 due to acute HF exacerbation. Received IV lasix initially with net loss of 7L. Transitioned to oral diuretics. Discharged after 6 days.   She presents today for a follow-up visit with a chief complaint of moderate shortness of breath upon minimal exertion. She describes this as chronic in nature having been present for several years. She has associated light-headedness and fatigue along with this. She denies any difficulty sleeping, abdominal distention, palpitations, pedal edema, chest pain or cough.   She says that she doesn't have any scales at home. Has tolerated entresto without known side effects.   Past Medical History:  Diagnosis Date  . Allergy   . Anemia   . Arrhythmia   . CHF (congestive heart failure) ([place])   . CVA (cerebral vascular accident) ([place])   . Depression   . GERD (gastroesophageal reflux disease)   . History of blood transfusion   . History of kidney stones   . Hx: UTI (urinary tract infection)   . Hypercholesterolemia   . Hypertension    Past Surgical History:  Procedure Laterality Date  . CHOLECYSTECTOMY  1984  . NECK LESION BIOPSY  2015   Followed by Dr. Richardson Landry   Family History  Problem Relation Age of Onset  . Cancer Mother        unknown type  . Hypertension Mother   . Cancer Father        unknown type  . Cancer Daughter   . Colon cancer Other        grandfather   Social History   Tobacco Use  . Smoking status: Former Research scientist (life sciences)  . Smokeless tobacco: Former Systems developer    Types: Chew  Substance Use Topics  . Alcohol use: No    Alcohol/week: 0.0 standard drinks    Allergies  Allergen Reactions  . Dyazide [Hydrochlorothiazide W-Triamterene]   . Monopril [Fosinopril]   . Toprol Xl [Metoprolol Tartrate]   . Verapamil    Prior to Admission medications   Medication Sig Start Date End Date Taking? Authorizing Provider  alum & mag hydroxide-simeth (MAALOX/MYLANTA) 200-200-20 MG/5ML suspension Take 15 mLs by mouth every 6 (six) hours as needed for indigestion or heartburn. 07/25/19  Yes Black, Lezlie Octave, NP  aspirin EC 81 MG EC tablet Take 1 tablet (81 mg total) by mouth daily. 05/20/19  Yes Lavina Hamman, MD  calcium carbonate (TUMS - DOSED IN MG ELEMENTAL CALCIUM) 500 MG chewable tablet Chew 1 tablet (200 mg of elemental calcium total) by mouth 3 (three) times daily as needed for indigestion or heartburn. 07/25/19  Yes Black, Lezlie Octave, NP  docusate sodium (COLACE) 100 MG capsule Take 1 capsule (100 mg total) by mouth 2 (two) times daily as needed for mild constipation or moderate constipation. 07/25/19  Yes Black, Lezlie Octave, NP  escitalopram (LEXAPRO) 10 MG tablet Take 0.5 tablets (5 mg total) by mouth daily. 06/19/19  Yes [name], MD  furosemide (LASIX) 40 MG tablet Take 1 tablet (40 mg total) by mouth daily. Patient taking differently: Take 20 mg by mouth daily.  07/26/19 07/25/20 Yes Black,  Lezlie Octave, NP  metoprolol tartrate (LOPRESSOR) 25 MG tablet Take 0.5 tablets (12.5 mg total) by mouth 2 (two) times daily. 07/25/19  Yes Black, Lezlie Octave, NP  sacubitril-valsartan (ENTRESTO) 24-26 MG Take 1 tablet by mouth 2 (two) times daily.   Yes [provider]    Review of Systems  Constitutional: Positive for fatigue. Negative for appetite change.  HENT: Negative for congestion, postnasal drip and sore throat.   Eyes: Negative.   Respiratory: Positive for shortness of breath (with little exertion). Negative for cough.   Cardiovascular: Negative for chest pain, palpitations and leg swelling.  Gastrointestinal: Negative for abdominal distention and  abdominal pain.  Endocrine: Negative.   Genitourinary: Negative.   Musculoskeletal: Negative for back pain and neck pain.  Skin: Negative.   Allergic/Immunologic: Negative.   Neurological: Positive for light-headedness. Negative for dizziness.  Hematological: Negative for adenopathy. Does not bruise/bleed easily.  Psychiatric/Behavioral: Negative for dysphoric mood and sleep disturbance (sleeping on 2 pillows). The patient is not nervous/anxious.    Vitals:   08/28/19 1227  BP: 125/64  Pulse: 69  Resp: 16  SpO2: 92%  Weight: 105 lb 8 oz (47.9 kg)  Height: 5\' 5"  (1.651 m)   Wt Readings from Last 3 Encounters:  08/28/19 105 lb 8 oz (47.9 kg)  08/13/19 108 lb (49 kg)  08/06/19 109 lb 6 oz (49.6 kg)   Lab Results  Component Value Date   CREATININE 0.81 07/25/2019   CREATININE 0.88 07/24/2019   CREATININE 0.83 07/23/2019    Physical Exam Vitals and nursing note reviewed.  Constitutional:      Appearance: She is well-developed.  HENT:     Head: Normocephalic and atraumatic.  Neck:     Vascular: No JVD.  Cardiovascular:     Rate and Rhythm: Normal rate and regular rhythm.  Pulmonary:     Effort: Pulmonary effort is normal. No respiratory distress.     Breath sounds: No wheezing or rales.  Musculoskeletal:     Cervical back: Normal range of motion and neck supple.     Right lower leg: No tenderness. No edema.     Left lower leg: No tenderness. No edema.  Skin:    General: Skin is warm and dry.  Neurological:     General: No focal deficit present.     Mental Status: She is alert and oriented to person, place, and time.  Psychiatric:        Mood and Affect: Mood normal.        Behavior: Behavior normal.    Assessment & Plan:  1: Chronic heart failure with preserved ejection fraction and structural changes of moderate LVH- - NYHA class III - euvolemic today - patient has no scales so a set was given to her; instructed to call for an overnight weight gain of >2  pounds or a weekly weight gain of >5 pounds - weight down 4 pounds from last visit here 1 month ago - patient says that she does add salt to some foods and her daughter says that she doesn't cook food with salt; encouraged her to not add any salt to her food - began entresto 24/26mg  BID started at her last visit; consider titrating up but need to watch BP closely as patient is 86 y/o - check BMP today - BNP 07/25/19 was 220.0  2: HTN- - BP looks good today - saw PCP Nicki Reaper) 08/13/19 - BMP 07/25/19 reviewed and showed sodium 137, potassium 3.9, creatinine 0.81 and  GFR >60   Medication bottles reviewed.   Return in 3 months or sooner for any questions/problems before then.

## 2019-08-28 ENCOUNTER — Encounter: Payer: Self-pay | Admitting: Family

## 2019-08-28 ENCOUNTER — Ambulatory Visit: Payer: Medicare Other | Attending: Family | Admitting: Family

## 2019-08-28 ENCOUNTER — Other Ambulatory Visit: Payer: Self-pay

## 2019-08-28 VITALS — BP 125/64 | HR 69 | Resp 16 | Ht 65.0 in | Wt 105.5 lb

## 2019-08-28 DIAGNOSIS — E7800 Pure hypercholesterolemia, unspecified: Secondary | ICD-10-CM | POA: Diagnosis not present

## 2019-08-28 DIAGNOSIS — Z79899 Other long term (current) drug therapy: Secondary | ICD-10-CM | POA: Insufficient documentation

## 2019-08-28 DIAGNOSIS — I110 Hypertensive heart disease with heart failure: Secondary | ICD-10-CM | POA: Insufficient documentation

## 2019-08-28 DIAGNOSIS — Z8673 Personal history of transient ischemic attack (TIA), and cerebral infarction without residual deficits: Secondary | ICD-10-CM | POA: Diagnosis not present

## 2019-08-28 DIAGNOSIS — E785 Hyperlipidemia, unspecified: Secondary | ICD-10-CM | POA: Insufficient documentation

## 2019-08-28 DIAGNOSIS — Z7982 Long term (current) use of aspirin: Secondary | ICD-10-CM | POA: Diagnosis not present

## 2019-08-28 DIAGNOSIS — F329 Major depressive disorder, single episode, unspecified: Secondary | ICD-10-CM | POA: Insufficient documentation

## 2019-08-28 DIAGNOSIS — K219 Gastro-esophageal reflux disease without esophagitis: Secondary | ICD-10-CM | POA: Insufficient documentation

## 2019-08-28 DIAGNOSIS — I10 Essential (primary) hypertension: Secondary | ICD-10-CM

## 2019-08-28 DIAGNOSIS — Z8249 Family history of ischemic heart disease and other diseases of the circulatory system: Secondary | ICD-10-CM | POA: Diagnosis not present

## 2019-08-28 DIAGNOSIS — Z87442 Personal history of urinary calculi: Secondary | ICD-10-CM | POA: Insufficient documentation

## 2019-08-28 DIAGNOSIS — Z87891 Personal history of nicotine dependence: Secondary | ICD-10-CM | POA: Diagnosis not present

## 2019-08-28 DIAGNOSIS — I5032 Chronic diastolic (congestive) heart failure: Secondary | ICD-10-CM | POA: Diagnosis not present

## 2019-08-28 LAB — BASIC METABOLIC PANEL
Anion gap: 7 (ref 5–15)
BUN: 19 mg/dL (ref 8–23)
CO2: 29 mmol/L (ref 22–32)
Calcium: 9.5 mg/dL (ref 8.9–10.3)
Chloride: 99 mmol/L (ref 98–111)
Creatinine, Ser: 0.91 mg/dL (ref 0.44–1.00)
GFR calc Af Amer: >60 mL/min (ref >60)
GFR calc non Af Amer: 53 mL/min — ABNORMAL LOW (ref >60)
Glucose, Bld: 98 mg/dL (ref 70–99)
Potassium: 4 mmol/L (ref 3.5–5.1)
Sodium: 135 mmol/L (ref 135–145)

## 2019-08-28 NOTE — Patient Instructions (Addendum)
Begin weighing daily and call for an overnight weight gain of > 2 pounds or a weekly weight gain of >5 pounds. 

## 2019-08-29 DIAGNOSIS — J9611 Chronic respiratory failure with hypoxia: Secondary | ICD-10-CM | POA: Diagnosis not present

## 2019-08-29 DIAGNOSIS — I110 Hypertensive heart disease with heart failure: Secondary | ICD-10-CM | POA: Diagnosis not present

## 2019-08-29 DIAGNOSIS — L89322 Pressure ulcer of left buttock, stage 2: Secondary | ICD-10-CM | POA: Diagnosis not present

## 2019-08-29 DIAGNOSIS — I5031 Acute diastolic (congestive) heart failure: Secondary | ICD-10-CM | POA: Diagnosis not present

## 2019-08-29 DIAGNOSIS — E785 Hyperlipidemia, unspecified: Secondary | ICD-10-CM | POA: Diagnosis not present

## 2019-08-29 DIAGNOSIS — Z9181 History of falling: Secondary | ICD-10-CM | POA: Diagnosis not present

## 2019-08-29 DIAGNOSIS — J9621 Acute and chronic respiratory failure with hypoxia: Secondary | ICD-10-CM | POA: Diagnosis not present

## 2019-08-29 DIAGNOSIS — D649 Anemia, unspecified: Secondary | ICD-10-CM | POA: Diagnosis not present

## 2019-08-29 DIAGNOSIS — Z9981 Dependence on supplemental oxygen: Secondary | ICD-10-CM | POA: Diagnosis not present

## 2019-08-29 DIAGNOSIS — Z8673 Personal history of transient ischemic attack (TIA), and cerebral infarction without residual deficits: Secondary | ICD-10-CM | POA: Diagnosis not present

## 2019-08-29 DIAGNOSIS — K219 Gastro-esophageal reflux disease without esophagitis: Secondary | ICD-10-CM | POA: Diagnosis not present

## 2019-08-30 DIAGNOSIS — I5031 Acute diastolic (congestive) heart failure: Secondary | ICD-10-CM | POA: Diagnosis not present

## 2019-08-30 DIAGNOSIS — J9611 Chronic respiratory failure with hypoxia: Secondary | ICD-10-CM | POA: Diagnosis not present

## 2019-08-30 DIAGNOSIS — Z8673 Personal history of transient ischemic attack (TIA), and cerebral infarction without residual deficits: Secondary | ICD-10-CM | POA: Diagnosis not present

## 2019-08-30 DIAGNOSIS — E785 Hyperlipidemia, unspecified: Secondary | ICD-10-CM | POA: Diagnosis not present

## 2019-08-30 DIAGNOSIS — L89322 Pressure ulcer of left buttock, stage 2: Secondary | ICD-10-CM | POA: Diagnosis not present

## 2019-08-30 DIAGNOSIS — K219 Gastro-esophageal reflux disease without esophagitis: Secondary | ICD-10-CM | POA: Diagnosis not present

## 2019-08-30 DIAGNOSIS — Z9981 Dependence on supplemental oxygen: Secondary | ICD-10-CM | POA: Diagnosis not present

## 2019-08-30 DIAGNOSIS — D649 Anemia, unspecified: Secondary | ICD-10-CM | POA: Diagnosis not present

## 2019-08-30 DIAGNOSIS — I110 Hypertensive heart disease with heart failure: Secondary | ICD-10-CM | POA: Diagnosis not present

## 2019-08-30 DIAGNOSIS — J9621 Acute and chronic respiratory failure with hypoxia: Secondary | ICD-10-CM | POA: Diagnosis not present

## 2019-08-30 DIAGNOSIS — Z9181 History of falling: Secondary | ICD-10-CM | POA: Diagnosis not present

## 2019-09-04 DIAGNOSIS — I10 Essential (primary) hypertension: Secondary | ICD-10-CM | POA: Diagnosis not present

## 2019-09-05 DIAGNOSIS — F419 Anxiety disorder, unspecified: Secondary | ICD-10-CM

## 2019-09-05 DIAGNOSIS — L89322 Pressure ulcer of left buttock, stage 2: Secondary | ICD-10-CM | POA: Diagnosis not present

## 2019-09-05 DIAGNOSIS — Z9981 Dependence on supplemental oxygen: Secondary | ICD-10-CM | POA: Diagnosis not present

## 2019-09-05 DIAGNOSIS — F339 Major depressive disorder, recurrent, unspecified: Secondary | ICD-10-CM

## 2019-09-05 DIAGNOSIS — J9621 Acute and chronic respiratory failure with hypoxia: Secondary | ICD-10-CM | POA: Diagnosis not present

## 2019-09-05 DIAGNOSIS — D649 Anemia, unspecified: Secondary | ICD-10-CM

## 2019-09-05 DIAGNOSIS — J9611 Chronic respiratory failure with hypoxia: Secondary | ICD-10-CM | POA: Diagnosis not present

## 2019-09-05 DIAGNOSIS — E785 Hyperlipidemia, unspecified: Secondary | ICD-10-CM | POA: Diagnosis not present

## 2019-09-05 DIAGNOSIS — I5031 Acute diastolic (congestive) heart failure: Secondary | ICD-10-CM | POA: Diagnosis not present

## 2019-09-05 DIAGNOSIS — R32 Unspecified urinary incontinence: Secondary | ICD-10-CM

## 2019-09-05 DIAGNOSIS — K219 Gastro-esophageal reflux disease without esophagitis: Secondary | ICD-10-CM | POA: Diagnosis not present

## 2019-09-05 DIAGNOSIS — Z9181 History of falling: Secondary | ICD-10-CM

## 2019-09-05 DIAGNOSIS — I110 Hypertensive heart disease with heart failure: Secondary | ICD-10-CM | POA: Diagnosis not present

## 2019-09-05 DIAGNOSIS — Z8673 Personal history of transient ischemic attack (TIA), and cerebral infarction without residual deficits: Secondary | ICD-10-CM | POA: Diagnosis not present

## 2019-09-09 DIAGNOSIS — J9611 Chronic respiratory failure with hypoxia: Secondary | ICD-10-CM | POA: Diagnosis not present

## 2019-09-09 DIAGNOSIS — Z8673 Personal history of transient ischemic attack (TIA), and cerebral infarction without residual deficits: Secondary | ICD-10-CM | POA: Diagnosis not present

## 2019-09-09 DIAGNOSIS — Z9181 History of falling: Secondary | ICD-10-CM | POA: Diagnosis not present

## 2019-09-09 DIAGNOSIS — I5031 Acute diastolic (congestive) heart failure: Secondary | ICD-10-CM | POA: Diagnosis not present

## 2019-09-09 DIAGNOSIS — J9601 Acute respiratory failure with hypoxia: Secondary | ICD-10-CM | POA: Diagnosis not present

## 2019-09-12 ENCOUNTER — Telehealth: Payer: Self-pay | Admitting: Internal Medicine

## 2019-09-12 DIAGNOSIS — J9621 Acute and chronic respiratory failure with hypoxia: Secondary | ICD-10-CM | POA: Diagnosis not present

## 2019-09-12 DIAGNOSIS — D649 Anemia, unspecified: Secondary | ICD-10-CM | POA: Diagnosis not present

## 2019-09-12 DIAGNOSIS — Z9181 History of falling: Secondary | ICD-10-CM | POA: Diagnosis not present

## 2019-09-12 DIAGNOSIS — J9611 Chronic respiratory failure with hypoxia: Secondary | ICD-10-CM | POA: Diagnosis not present

## 2019-09-12 DIAGNOSIS — Z8673 Personal history of transient ischemic attack (TIA), and cerebral infarction without residual deficits: Secondary | ICD-10-CM | POA: Diagnosis not present

## 2019-09-12 DIAGNOSIS — Z9981 Dependence on supplemental oxygen: Secondary | ICD-10-CM | POA: Diagnosis not present

## 2019-09-12 DIAGNOSIS — L89322 Pressure ulcer of left buttock, stage 2: Secondary | ICD-10-CM | POA: Diagnosis not present

## 2019-09-12 DIAGNOSIS — E785 Hyperlipidemia, unspecified: Secondary | ICD-10-CM | POA: Diagnosis not present

## 2019-09-12 DIAGNOSIS — K219 Gastro-esophageal reflux disease without esophagitis: Secondary | ICD-10-CM | POA: Diagnosis not present

## 2019-09-12 DIAGNOSIS — I5031 Acute diastolic (congestive) heart failure: Secondary | ICD-10-CM | POA: Diagnosis not present

## 2019-09-12 DIAGNOSIS — I110 Hypertensive heart disease with heart failure: Secondary | ICD-10-CM | POA: Diagnosis not present

## 2019-09-12 NOTE — Telephone Encounter (Signed)
Taylor Watson from [employer] said they faxed over incogtinence orders that need to be signed and faxed back. She said they were sent on 09/03/19 and 09/21/19. They would like a call back on the status of the orders.

## 2019-09-13 ENCOUNTER — Telehealth: Payer: Self-pay | Admitting: Internal Medicine

## 2019-09-13 MED ORDER — ENTRESTO 24-26 MG PO TABS: 1.0000 | ORAL_TABLET | Freq: Two times a day (BID) | ORAL | 1 refills | Status: DC

## 2019-09-13 MED ORDER — FUROSEMIDE 40 MG PO TABS: 20.0000 mg | ORAL_TABLET | Freq: Every day | ORAL | 1 refills | Status: DC

## 2019-09-13 MED ORDER — METOPROLOL TARTRATE 25 MG PO TABS: 12.5000 mg | ORAL_TABLET | Freq: Two times a day (BID) | ORAL | Status: DC

## 2019-09-13 NOTE — Telephone Encounter (Signed)
Pt needs refill on sacubitril-valsartan (ENTRESTO) 24-26 MG and furosemide (LASIX) 40 MG tablet and lexapro and metoprolol tartrate (LOPRESSOR) 25 MG tablet. Pt is out

## 2019-09-14 NOTE — Telephone Encounter (Signed)
Faxed to [employer]

## 2019-09-15 DIAGNOSIS — R0602 Shortness of breath: Secondary | ICD-10-CM | POA: Diagnosis not present

## 2019-09-19 DIAGNOSIS — E785 Hyperlipidemia, unspecified: Secondary | ICD-10-CM | POA: Diagnosis not present

## 2019-09-19 DIAGNOSIS — D649 Anemia, unspecified: Secondary | ICD-10-CM | POA: Diagnosis not present

## 2019-09-19 DIAGNOSIS — I110 Hypertensive heart disease with heart failure: Secondary | ICD-10-CM | POA: Diagnosis not present

## 2019-09-19 DIAGNOSIS — J9611 Chronic respiratory failure with hypoxia: Secondary | ICD-10-CM | POA: Diagnosis not present

## 2019-09-19 DIAGNOSIS — Z9981 Dependence on supplemental oxygen: Secondary | ICD-10-CM | POA: Diagnosis not present

## 2019-09-19 DIAGNOSIS — Z9181 History of falling: Secondary | ICD-10-CM | POA: Diagnosis not present

## 2019-09-19 DIAGNOSIS — I5031 Acute diastolic (congestive) heart failure: Secondary | ICD-10-CM | POA: Diagnosis not present

## 2019-09-19 DIAGNOSIS — K219 Gastro-esophageal reflux disease without esophagitis: Secondary | ICD-10-CM | POA: Diagnosis not present

## 2019-09-19 DIAGNOSIS — J9621 Acute and chronic respiratory failure with hypoxia: Secondary | ICD-10-CM | POA: Diagnosis not present

## 2019-09-19 DIAGNOSIS — L89322 Pressure ulcer of left buttock, stage 2: Secondary | ICD-10-CM | POA: Diagnosis not present

## 2019-09-19 DIAGNOSIS — Z8673 Personal history of transient ischemic attack (TIA), and cerebral infarction without residual deficits: Secondary | ICD-10-CM | POA: Diagnosis not present

## 2019-09-26 DIAGNOSIS — D649 Anemia, unspecified: Secondary | ICD-10-CM | POA: Diagnosis not present

## 2019-09-26 DIAGNOSIS — Z9181 History of falling: Secondary | ICD-10-CM | POA: Diagnosis not present

## 2019-09-26 DIAGNOSIS — I5031 Acute diastolic (congestive) heart failure: Secondary | ICD-10-CM | POA: Diagnosis not present

## 2019-09-26 DIAGNOSIS — L89322 Pressure ulcer of left buttock, stage 2: Secondary | ICD-10-CM | POA: Diagnosis not present

## 2019-09-26 DIAGNOSIS — E785 Hyperlipidemia, unspecified: Secondary | ICD-10-CM | POA: Diagnosis not present

## 2019-09-26 DIAGNOSIS — I110 Hypertensive heart disease with heart failure: Secondary | ICD-10-CM | POA: Diagnosis not present

## 2019-09-26 DIAGNOSIS — J9621 Acute and chronic respiratory failure with hypoxia: Secondary | ICD-10-CM | POA: Diagnosis not present

## 2019-09-26 DIAGNOSIS — K219 Gastro-esophageal reflux disease without esophagitis: Secondary | ICD-10-CM | POA: Diagnosis not present

## 2019-09-26 DIAGNOSIS — Z8673 Personal history of transient ischemic attack (TIA), and cerebral infarction without residual deficits: Secondary | ICD-10-CM | POA: Diagnosis not present

## 2019-09-26 DIAGNOSIS — J9611 Chronic respiratory failure with hypoxia: Secondary | ICD-10-CM | POA: Diagnosis not present

## 2019-09-26 DIAGNOSIS — Z9981 Dependence on supplemental oxygen: Secondary | ICD-10-CM | POA: Diagnosis not present

## 2019-10-03 DIAGNOSIS — J9611 Chronic respiratory failure with hypoxia: Secondary | ICD-10-CM | POA: Diagnosis not present

## 2019-10-03 DIAGNOSIS — J9621 Acute and chronic respiratory failure with hypoxia: Secondary | ICD-10-CM | POA: Diagnosis not present

## 2019-10-03 DIAGNOSIS — K219 Gastro-esophageal reflux disease without esophagitis: Secondary | ICD-10-CM | POA: Diagnosis not present

## 2019-10-03 DIAGNOSIS — E785 Hyperlipidemia, unspecified: Secondary | ICD-10-CM | POA: Diagnosis not present

## 2019-10-03 DIAGNOSIS — Z9181 History of falling: Secondary | ICD-10-CM | POA: Diagnosis not present

## 2019-10-03 DIAGNOSIS — I5031 Acute diastolic (congestive) heart failure: Secondary | ICD-10-CM | POA: Diagnosis not present

## 2019-10-03 DIAGNOSIS — D649 Anemia, unspecified: Secondary | ICD-10-CM | POA: Diagnosis not present

## 2019-10-03 DIAGNOSIS — Z8673 Personal history of transient ischemic attack (TIA), and cerebral infarction without residual deficits: Secondary | ICD-10-CM | POA: Diagnosis not present

## 2019-10-03 DIAGNOSIS — I110 Hypertensive heart disease with heart failure: Secondary | ICD-10-CM | POA: Diagnosis not present

## 2019-10-03 DIAGNOSIS — L89322 Pressure ulcer of left buttock, stage 2: Secondary | ICD-10-CM | POA: Diagnosis not present

## 2019-10-03 DIAGNOSIS — Z9981 Dependence on supplemental oxygen: Secondary | ICD-10-CM | POA: Diagnosis not present

## 2019-10-05 DIAGNOSIS — I10 Essential (primary) hypertension: Secondary | ICD-10-CM | POA: Diagnosis not present

## 2019-10-10 DIAGNOSIS — J9611 Chronic respiratory failure with hypoxia: Secondary | ICD-10-CM | POA: Diagnosis not present

## 2019-10-10 DIAGNOSIS — J9601 Acute respiratory failure with hypoxia: Secondary | ICD-10-CM | POA: Diagnosis not present

## 2019-10-10 DIAGNOSIS — Z9181 History of falling: Secondary | ICD-10-CM | POA: Diagnosis not present

## 2019-10-10 DIAGNOSIS — E785 Hyperlipidemia, unspecified: Secondary | ICD-10-CM | POA: Diagnosis not present

## 2019-10-10 DIAGNOSIS — Z8673 Personal history of transient ischemic attack (TIA), and cerebral infarction without residual deficits: Secondary | ICD-10-CM | POA: Diagnosis not present

## 2019-10-10 DIAGNOSIS — I110 Hypertensive heart disease with heart failure: Secondary | ICD-10-CM | POA: Diagnosis not present

## 2019-10-10 DIAGNOSIS — L89322 Pressure ulcer of left buttock, stage 2: Secondary | ICD-10-CM | POA: Diagnosis not present

## 2019-10-10 DIAGNOSIS — K219 Gastro-esophageal reflux disease without esophagitis: Secondary | ICD-10-CM | POA: Diagnosis not present

## 2019-10-10 DIAGNOSIS — I5031 Acute diastolic (congestive) heart failure: Secondary | ICD-10-CM | POA: Diagnosis not present

## 2019-10-10 DIAGNOSIS — Z9981 Dependence on supplemental oxygen: Secondary | ICD-10-CM | POA: Diagnosis not present

## 2019-10-10 DIAGNOSIS — D649 Anemia, unspecified: Secondary | ICD-10-CM | POA: Diagnosis not present

## 2019-10-10 DIAGNOSIS — J9621 Acute and chronic respiratory failure with hypoxia: Secondary | ICD-10-CM | POA: Diagnosis not present

## 2019-10-12 ENCOUNTER — Other Ambulatory Visit: Payer: Self-pay

## 2019-10-12 ENCOUNTER — Telehealth (INDEPENDENT_AMBULATORY_CARE_PROVIDER_SITE_OTHER): Payer: Medicare Other | Admitting: Internal Medicine

## 2019-10-12 ENCOUNTER — Encounter: Payer: Self-pay | Admitting: Internal Medicine

## 2019-10-12 DIAGNOSIS — D649 Anemia, unspecified: Secondary | ICD-10-CM

## 2019-10-12 DIAGNOSIS — E538 Deficiency of other specified B group vitamins: Secondary | ICD-10-CM

## 2019-10-12 DIAGNOSIS — I4891 Unspecified atrial fibrillation: Secondary | ICD-10-CM | POA: Diagnosis not present

## 2019-10-12 DIAGNOSIS — F320 Major depressive disorder, single episode, mild: Secondary | ICD-10-CM

## 2019-10-12 DIAGNOSIS — F32A Depression, unspecified: Secondary | ICD-10-CM

## 2019-10-12 DIAGNOSIS — E7800 Pure hypercholesterolemia, unspecified: Secondary | ICD-10-CM

## 2019-10-12 DIAGNOSIS — I10 Essential (primary) hypertension: Secondary | ICD-10-CM

## 2019-10-12 DIAGNOSIS — F419 Anxiety disorder, unspecified: Secondary | ICD-10-CM

## 2019-10-12 NOTE — Progress Notes (Signed)
Patient ID: Taylor Watson, female   DOB: Jul 03, 1918, 86 y.o.   MRN: [id number]   Virtual Visit via telephone Note  This visit type was conducted due to national recommendations for restrictions regarding the COVID-19 pandemic (e.g. social distancing).  This format is felt to be most appropriate for this patient at this time.  All issues noted in this document were discussed and addressed.  No physical exam was performed (except for noted visual exam findings with Video Visits).   I connected with Taylor Watson by telephone and verified that I am speaking with the correct person using two identifiers. Location patient: home Location provider: work Persons participating in the telephone visit: patient, provider, pts daugher Taylor Watson.    The limitations, risks, security and privacy concerns of performing an evaluation and management service by telephone and the availability of in person appointments have been discussed.   It has also been discussed with the patient that there may be a patient responsible charge related to this service. The patient has expressed understanding and has agreed to proceed.   Reason for visit:  Scheduled follow up.    HPI: Admitted 07/19/19 with acute hypoxic respiratory failure and bilateral lower extremity swelling.  Received IV lasix.  Found to be in afib.  Resolved.  Discharged to [facility].  Now at home.  On entrsto, furosemide and aspirin.  States doing well.  Breathing ok.  Eating well.  Walking.  No falls.  No chest pain.  No abdominal pain and no acid reflux reported.  Bowels moving.  Taking lexapro.  Feels anxiety under control.  Has not had recent blood work.  Discussed the need for f/u labs, given on entresto and lasix.  Overall feels she is doing well.    ROS: See pertinent positives and negatives per HPI.  Past Medical History:  Diagnosis Date  . Allergy   . Anemia   . Arrhythmia   . CHF (congestive heart failure) ([place])   . CVA (cerebral vascular  accident) ([place])   . Depression   . GERD (gastroesophageal reflux disease)   . History of blood transfusion   . History of kidney stones   . Hx: UTI (urinary tract infection)   . Hypercholesterolemia   . Hypertension     Past Surgical History:  Procedure Laterality Date  . CHOLECYSTECTOMY  1984  . NECK LESION BIOPSY  2015   Followed by Dr. Richardson Landry    Family History  Problem Relation Age of Onset  . Cancer Mother        unknown type  . Hypertension Mother   . Cancer Father        unknown type  . Cancer Daughter   . Colon cancer Other        grandfather    SOCIAL HX: reviewed.    Current Outpatient Medications:  .  alum & mag hydroxide-simeth (MAALOX/MYLANTA) 200-200-20 MG/5ML suspension, Take 15 mLs by mouth every 6 (six) hours as needed for indigestion or heartburn., Disp: 355 mL, Rfl: 0 .  aspirin EC 81 MG EC tablet, Take 1 tablet (81 mg total) by mouth daily., Disp: 30 tablet, Rfl: 0 .  calcium carbonate (TUMS - DOSED IN MG ELEMENTAL CALCIUM) 500 MG chewable tablet, Chew 1 tablet (200 mg of elemental calcium total) by mouth 3 (three) times daily as needed for indigestion or heartburn., Disp:  , Rfl:  .  docusate sodium (COLACE) 100 MG capsule, Take 1 capsule (100 mg total) by mouth 2 (two) times  daily as needed for mild constipation or moderate constipation., Disp: 10 capsule, Rfl: 0 .  escitalopram (LEXAPRO) 10 MG tablet, Take 0.5 tablets (5 mg total) by mouth daily., Disp: 45 tablet, Rfl: 1 .  furosemide (LASIX) 40 MG tablet, Take 0.5 tablets (20 mg total) by mouth daily., Disp: 90 tablet, Rfl: 1 .  sacubitril-valsartan (ENTRESTO) 24-26 MG, Take 1 tablet by mouth 2 (two) times daily., Disp: 60 tablet, Rfl: 1 .  metoprolol tartrate (LOPRESSOR) 25 MG tablet, Take 0.5 tablets (12.5 mg total) by mouth 2 (two) times daily. (Patient not taking: Reported on 10/12/2019), Disp:  , Rfl:   EXAM:  GENERAL: alert. Sounds to be in no acute distress. Answering questions appropriately.     PSYCH/NEURO: pleasant and cooperative, no obvious depression or anxiety, speech and thought processing grossly intact  ASSESSMENT AND PLAN:  Discussed the following assessment and plan:  Hypercholesterolemia Follow lipid panel.   Essential hypertension, benign Maintained on entresto and lasix.  Follow pressure.  Follow metabolic panel.  Overdue labs.    Mild depression ([place]) Overalls she feels she is doing well on lexapro.  Follow.    Atrial fibrillation with rapid ventricular response ([place]) Noted in hospital.  Discharged in [place].  No chest pain, increased heart rate or palpitations reported.  Breathing stable.  Follow.    Anxiety Stable on lexapro.    Anemia Follow cbc.  Recheck with next labs.    B12 deficiency Recheck B12 level with next labs.     Orders Placed This Encounter  Procedures  . CBC with Differential/Platelet    Standing Status:   Future    Standing Expiration Date:   10/12/2020  . Hepatic function panel    Standing Status:   Future    Standing Expiration Date:   10/12/2020  . TSH    Standing Status:   Future    Standing Expiration Date:   10/12/2020  . Vitamin B12    Standing Status:   Future    Standing Expiration Date:   10/12/2020  . Basic metabolic panel    Standing Status:   Future    Standing Expiration Date:   10/12/2020  . IBC + Ferritin    Standing Status:   Future    Standing Expiration Date:   10/12/2020     I discussed the assessment and treatment plan with the patient. The patient was provided an opportunity to ask questions and all were answered. The patient agreed with the plan and demonstrated an understanding of the instructions.   The patient was advised to call back or seek an in-person evaluation if the symptoms worsen or if the condition fails to improve as anticipated.  I provided 15 minutes of non-face-to-face time during this encounter.   [name], MD

## 2019-10-13 ENCOUNTER — Encounter: Payer: Self-pay | Admitting: Internal Medicine

## 2019-10-13 DIAGNOSIS — E538 Deficiency of other specified B group vitamins: Secondary | ICD-10-CM | POA: Insufficient documentation

## 2019-10-13 NOTE — Assessment & Plan Note (Signed)
Noted in hospital.  Discharged in [place].  No chest pain, increased heart rate or palpitations reported.  Breathing stable.  Follow.

## 2019-10-13 NOTE — Assessment & Plan Note (Signed)
Maintained on entresto and lasix.  Follow pressure.  Follow metabolic panel.  Overdue labs.

## 2019-10-13 NOTE — Assessment & Plan Note (Signed)
Follow lipid panel.   

## 2019-10-13 NOTE — Assessment & Plan Note (Signed)
Stable on lexapro.   

## 2019-10-13 NOTE — Assessment & Plan Note (Signed)
Follow cbc. Recheck with next labs.  

## 2019-10-13 NOTE — Assessment & Plan Note (Signed)
Overalls she feels she is doing well on lexapro.  Follow.

## 2019-10-13 NOTE — Addendum Note (Signed)
Addended by: Alisa Graff on: 10/13/2019 04:57 PM   Modules accepted: Level of Service

## 2019-10-13 NOTE — Assessment & Plan Note (Signed)
Recheck B12 level with next labs.  

## 2019-10-16 DIAGNOSIS — R0602 Shortness of breath: Secondary | ICD-10-CM | POA: Diagnosis not present

## 2019-11-02 ENCOUNTER — Telehealth: Payer: Self-pay | Admitting: Internal Medicine

## 2019-11-02 NOTE — Telephone Encounter (Signed)
In reviewing, it appears she is on entresto and lasix.  Followed by cardiology and CHF clinic.  She has not been on metoprolol or hydralazine.  Confirm doing well.  Confirm has f/u with cardiology and/or CHF clinic.  Would continue current medications as she is doing.  Let us know if problems.

## 2019-11-02 NOTE — Telephone Encounter (Signed)
See other note

## 2019-11-02 NOTE — Telephone Encounter (Signed)
Pt daughter called in she wanted to know if Dr.Scott wanted her mother to go back on her b/p medication

## 2019-11-02 NOTE — Telephone Encounter (Signed)
Patients grandson is aware. Will continue medications as she is doing and will see CHF clinic on 7/29

## 2019-11-02 NOTE — Telephone Encounter (Signed)
Patients grandson called in stating they were unsure if pt should be on bp medication or not. [facility] pharmacy called with refill on hyrdralazine which patient has not been on since she went to the hospital. Advised they should not pick up. No record of this in chart. She was taking metoprolol 12.5 mg bid until was released from rehab and then has been off metoprolol for about 3 months now. It was reported not taking during her phone visit on 6/11. Grandson confirmed not taking. Wasn't sure if she needs to restart. They do not have any way to check her bp. Advised that we would need some current bp readings to determine need for restarting bp medication. Home health has not been out since April or May.

## 2019-11-02 NOTE — Telephone Encounter (Signed)
Patient's son Taylor Watson needs to speak to someone about mother's bp medication. Confusion on which one to take and amount.

## 2019-11-04 ENCOUNTER — Encounter: Payer: Self-pay | Admitting: Emergency Medicine

## 2019-11-04 ENCOUNTER — Emergency Department: Payer: Medicare Other

## 2019-11-04 ENCOUNTER — Other Ambulatory Visit: Payer: Self-pay

## 2019-11-04 ENCOUNTER — Emergency Department
Admission: EM | Admit: 2019-11-04 | Discharge: 2019-11-04 | Disposition: A | Discharge status: Home / Self Care | Payer: Medicare Other | Attending: Emergency Medicine | Admitting: Emergency Medicine

## 2019-11-04 DIAGNOSIS — J90 Pleural effusion, not elsewhere classified: Secondary | ICD-10-CM | POA: Diagnosis not present

## 2019-11-04 DIAGNOSIS — Z87891 Personal history of nicotine dependence: Secondary | ICD-10-CM | POA: Diagnosis not present

## 2019-11-04 DIAGNOSIS — I509 Heart failure, unspecified: Secondary | ICD-10-CM | POA: Insufficient documentation

## 2019-11-04 DIAGNOSIS — R300 Dysuria: Secondary | ICD-10-CM | POA: Insufficient documentation

## 2019-11-04 DIAGNOSIS — R531 Weakness: Secondary | ICD-10-CM

## 2019-11-04 DIAGNOSIS — R0602 Shortness of breath: Secondary | ICD-10-CM | POA: Diagnosis not present

## 2019-11-04 DIAGNOSIS — Z79899 Other long term (current) drug therapy: Secondary | ICD-10-CM | POA: Insufficient documentation

## 2019-11-04 DIAGNOSIS — I110 Hypertensive heart disease with heart failure: Secondary | ICD-10-CM | POA: Diagnosis not present

## 2019-11-04 DIAGNOSIS — R42 Dizziness and giddiness: Secondary | ICD-10-CM | POA: Diagnosis present

## 2019-11-04 DIAGNOSIS — J9811 Atelectasis: Secondary | ICD-10-CM | POA: Diagnosis not present

## 2019-11-04 DIAGNOSIS — Z7982 Long term (current) use of aspirin: Secondary | ICD-10-CM | POA: Insufficient documentation

## 2019-11-04 DIAGNOSIS — I517 Cardiomegaly: Secondary | ICD-10-CM | POA: Diagnosis not present

## 2019-11-04 DIAGNOSIS — I10 Essential (primary) hypertension: Secondary | ICD-10-CM | POA: Diagnosis not present

## 2019-11-04 LAB — URINALYSIS, COMPLETE (UACMP) WITH MICROSCOPIC
Bilirubin Urine: NEGATIVE
Glucose, UA: NEGATIVE mg/dL
Hgb urine dipstick: NEGATIVE
Ketones, ur: NEGATIVE mg/dL
Leukocytes,Ua: NEGATIVE
Nitrite: NEGATIVE
Protein, ur: NEGATIVE mg/dL
Specific Gravity, Urine: 1.003 — ABNORMAL LOW (ref 1.005–1.030)
pH: 7 (ref 5.0–8.0)

## 2019-11-04 LAB — CBC
HCT: 40.6 % (ref 36.0–46.0)
Hemoglobin: 13.2 g/dL (ref 12.0–15.0)
MCH: 29.1 pg (ref 26.0–34.0)
MCHC: 32.5 g/dL (ref 30.0–36.0)
MCV: 89.4 fL (ref 80.0–100.0)
Platelets: 245 10*3/uL (ref 150–400)
RBC: 4.54 MIL/uL (ref 3.87–5.11)
RDW: 14.5 % (ref 11.5–15.5)
WBC: 3.7 10*3/uL — ABNORMAL LOW (ref 4.0–10.5)
nRBC: 0 % (ref 0.0–0.2)

## 2019-11-04 LAB — BASIC METABOLIC PANEL
Anion gap: 12 (ref 5–15)
BUN: 12 mg/dL (ref 8–23)
CO2: 31 mmol/L (ref 22–32)
Calcium: 9.2 mg/dL (ref 8.9–10.3)
Chloride: 98 mmol/L (ref 98–111)
Creatinine, Ser: 0.8 mg/dL (ref 0.44–1.00)
GFR calc Af Amer: >60 mL/min (ref >60)
GFR calc non Af Amer: >60 mL/min (ref >60)
Glucose, Bld: 81 mg/dL (ref 70–99)
Potassium: 3.2 mmol/L — ABNORMAL LOW (ref 3.5–5.1)
Sodium: 141 mmol/L (ref 135–145)

## 2019-11-04 LAB — TROPONIN I (HIGH SENSITIVITY): Troponin I (High Sensitivity): 18 ng/L — ABNORMAL HIGH (ref <18)

## 2019-11-04 LAB — BRAIN NATRIURETIC PEPTIDE: B Natriuretic Peptide: 393.4 pg/mL — ABNORMAL HIGH (ref 0.0–100.0)

## 2019-11-04 MED ORDER — SODIUM CHLORIDE 0.9% FLUSH: 3.0000 mL | Freq: Once | INTRAVENOUS | Status: DC

## 2019-11-04 NOTE — ED Notes (Signed)
Pt and pt's family verbalized understanding of discharge instructions. NAD at this time.

## 2019-11-04 NOTE — ED Notes (Signed)
VS obtained by this RN. Pt visualized in NAD at this time. Caregiver/family member sitting with patient at this time.

## 2019-11-04 NOTE — ED Provider Notes (Signed)
[facility] Emergency Department Provider Note ____________________________________________   First MD Initiated Contact with Patient 11/04/19 1607     (approximate)  I have reviewed the triage vital signs and the nursing notes.   HISTORY  Chief Complaint Shortness of Breath, Dysuria, and Weakness  HPI Taylor Watson is a 86 y.o. female presents to the ER for treatment of lightheadedness, shortness of breath, and incontinence. Daughter reports she has been dizzy and weak for the past week or so. Patient denies pain     Past Medical History:  Diagnosis Date  . Allergy   . Anemia   . Arrhythmia   . CHF (congestive heart failure) ([place])   . CVA (cerebral vascular accident) ([place])   . Depression   . GERD (gastroesophageal reflux disease)   . History of blood transfusion   . History of kidney stones   . Hx: UTI (urinary tract infection)   . Hypercholesterolemia   . Hypertension     Patient Active Problem List   Diagnosis Date Noted  . B12 deficiency 10/13/2019  . Skin lesion 08/19/2019  . Acute exacerbation of CHF (congestive heart failure) ([place]) 07/22/2019  . Acute respiratory failure with hypoxia ([place]) 07/22/2019  . Swelling of lower extremity 07/22/2019  . Atrial fibrillation with rapid ventricular response ([place]) 07/22/2019  . Fall at home, initial encounter 07/22/2019  . Volume overload 07/19/2019  . Hypertensive urgency 05/18/2019  . Acute metabolic encephalopathy 37/85/8850  . Elevated troponin 05/18/2019  . Hypokalemia 05/18/2019  . URI (upper respiratory infection) 08/26/2018  . Headache 07/17/2017  . SOB (shortness of breath) 03/28/2016  . Near syncope 03/01/2016  . Anemia 10/28/2015  . Lower GI bleed   . Goals of care, counseling/discussion   . Blood loss anemia 06/01/2015  . GERD (gastroesophageal reflux disease)   . Mild depression ([place])   . Unsteady gait 02/08/2015  . Schwannoma 10/07/2013  . Rectal bleeding 04/03/2013    . Light headedness 01/19/2013  . Loss of weight 10/22/2012  . Hypercholesterolemia 10/22/2012  . History of CVA (cerebrovascular accident) 10/22/2012  . Anxiety 10/22/2012  . Essential hypertension, benign 10/22/2012    Past Surgical History:  Procedure Laterality Date  . CHOLECYSTECTOMY  1984  . NECK LESION BIOPSY  2015   Followed by Dr. Richardson Landry    Prior to Admission medications   Medication Sig Start Date End Date Taking? Authorizing Provider  alum & mag hydroxide-simeth (MAALOX/MYLANTA) 200-200-20 MG/5ML suspension Take 15 mLs by mouth every 6 (six) hours as needed for indigestion or heartburn. 07/25/19   Radene Gunning, NP  aspirin EC 81 MG EC tablet Take 1 tablet (81 mg total) by mouth daily. 05/20/19   Lavina Hamman, MD  calcium carbonate (TUMS - DOSED IN MG ELEMENTAL CALCIUM) 500 MG chewable tablet Chew 1 tablet (200 mg of elemental calcium total) by mouth 3 (three) times daily as needed for indigestion or heartburn. 07/25/19   Black, Lezlie Octave, NP  docusate sodium (COLACE) 100 MG capsule Take 1 capsule (100 mg total) by mouth 2 (two) times daily as needed for mild constipation or moderate constipation. 07/25/19   Black, Lezlie Octave, NP  escitalopram (LEXAPRO) 10 MG tablet Take 0.5 tablets (5 mg total) by mouth daily. 06/19/19   [name], MD  furosemide (LASIX) 40 MG tablet Take 0.5 tablets (20 mg total) by mouth daily. 09/13/19 09/12/20  [name], MD  metoprolol tartrate (LOPRESSOR) 25 MG tablet Take 0.5 tablets (12.5 mg total) by  mouth 2 (two) times daily. Patient not taking: Reported on 10/12/2019 09/13/19   [name], MD  sacubitril-valsartan (ENTRESTO) 24-26 MG Take 1 tablet by mouth 2 (two) times daily. 09/13/19   [name], MD    Allergies Dyazide [hydrochlorothiazide w-triamterene], Monopril [fosinopril], Toprol xl [metoprolol tartrate], and Verapamil  Family History  Problem Relation Age of Onset  . Cancer Mother        unknown type  . Hypertension  Mother   . Cancer Father        unknown type  . Cancer Daughter   . Colon cancer Other        grandfather    Social History Social History   Tobacco Use  . Smoking status: Former Research scientist (life sciences)  . Smokeless tobacco: Former Systems developer    Types: Chew  Substance Use Topics  . Alcohol use: No    Alcohol/week: 0.0 standard drinks  . Drug use: No    Review of Systems  Constitutional: No fever/chills Eyes: No visual changes. ENT: No sore throat. Cardiovascular: Denies chest pain. Respiratory: Denies shortness of breath. Gastrointestinal: No abdominal pain.  No nausea, no vomiting.  No diarrhea.  No constipation. Genitourinary: Negative for dysuria. Musculoskeletal: Negative for back pain. Skin: Negative for rash. Neurological: Negative for headaches, focal weakness or numbness. ____________________________________________   PHYSICAL EXAM:  VITAL SIGNS: ED Triage Vitals  Enc Vitals Group     BP 11/04/19 1327 (!) 197/90     Pulse Rate 11/04/19 1327 75     Resp 11/04/19 1327 18     Temp 11/04/19 1327 98.7 F (37.1 C)     Temp Source 11/04/19 1327 Oral     SpO2 11/04/19 1327 95 %     Weight 11/04/19 1328 115 lb (52.2 kg)     Height 11/04/19 1328 5\' 5"  (1.651 m)     Head Circumference --      Peak Flow --      Pain Score 11/04/19 1328 0     Pain Loc --      Pain Edu? --      Excl. in [place]? --     Constitutional: Alert and oriented. Well appearing and in no acute distress. Eyes: Conjunctivae are normal. PERRL. EOMI. Head: Atraumatic. Nose: No congestion/rhinnorhea. Mouth/Throat: Mucous membranes are moist.  Oropharynx non-erythematous. Neck: No stridor.   Hematological/Lymphatic/Immunilogical: No cervical lymphadenopathy. Cardiovascular: Normal rate, regular rhythm. Grossly normal heart sounds.  Good peripheral circulation. Respiratory: Normal respiratory effort.  No retractions. Lungs CTAB. Gastrointestinal: Soft and nontender. No distention. No abdominal bruits. No CVA  tenderness. Genitourinary:  Musculoskeletal: No lower extremity tenderness nor edema.  No joint effusions. Neurologic:  Normal speech and language. No gross focal neurologic deficits are appreciated. No gait instability. Skin:  Skin is warm, dry and intact. No rash noted. Psychiatric: Mood and affect are normal. Speech and behavior are normal.  ____________________________________________   LABS (all labs ordered are listed, but only abnormal results are displayed)  Labs Reviewed  BASIC METABOLIC PANEL - Abnormal; Notable for the following components:      Result Value   Potassium 3.2 (*)    All other components within normal limits  CBC - Abnormal; Notable for the following components:   WBC 3.7 (*)    All other components within normal limits  URINALYSIS, COMPLETE (UACMP) WITH MICROSCOPIC - Abnormal; Notable for the following components:   Color, Urine STRAW (*)    APPearance CLEAR (*)    Specific Gravity, Urine 1.003 (*)  Bacteria, UA MANY (*)    All other components within normal limits  BRAIN NATRIURETIC PEPTIDE - Abnormal; Notable for the following components:   B Natriuretic Peptide 393.4 (*)    All other components within normal limits  TROPONIN I (HIGH SENSITIVITY) - Abnormal; Notable for the following components:   Troponin I (High Sensitivity) 18 (*)    All other components within normal limits  CBG MONITORING, ED   ____________________________________________  EKG  ED ECG REPORT I, Sheniece Ruggles, FNP-BC personally viewed and interpreted this ECG.   Date: 11/04/2019  EKG Time: 1331  Rate: 78  Rhythm: Sinus rhythm with frequent PAC  Axis: Left  Intervals:none  ST&T Change: no ST elevation  ____________________________________________  RADIOLOGY  ED MD interpretation:    Chest x-ray shows interval improvement in bilateral effusions and atelectasis.  I, Sherrie George, personally viewed and evaluated these images (plain radiographs) as part of my  medical decision making, as well as reviewing the written report by the radiologist.  Official radiology report(s): DG Chest 1 View  Result Date: 11/04/2019 CLINICAL DATA:  Shortness of breath and lightheadedness since yesterday. EXAM: CHEST  1 VIEW COMPARISON:  07/19/2019, 05/17/2019 and 2018 FINDINGS: Lungs are somewhat hyperexpanded with interval improvement in previously noted bibasilar opacification likely improved/resolved bilateral effusions and bibasilar atelectasis. Stable coarse bronchovascular markings over the mid to upper lungs. Mild stable cardiomegaly. Remainder of the exam is unchanged. IMPRESSION: 1. Interval improvement to resolution of previously seen bilateral effusions with bibasilar atelectasis. 2. Persistent mild coarse prominence of the bronchovascular markings over the mid to upper lungs. Findings may be due to mild vascular congestion versus acute to subacute bronchitic process. Electronically Signed   By: Marin Olp M.D.   On: 11/04/2019 16:30    ____________________________________________   PROCEDURES  Procedure(s) performed (including Critical Care):  Procedures  ____________________________________________   INITIAL IMPRESSION / ASSESSMENT AND PLAN    86 year old female presenting to the emergency department for evaluation of weakness and urinary incontinence.  Patient denies any pain.  Daughter at bedside reports weakness seems to have gotten worse over the past week or so.   DIFFERENTIAL DIAGNOSIS  CHF exacerbation, age-related changes, acute cystitis  ED COURSE  Chest x-ray does not show any new findings of concern.  Exam is overall reassuring.  Labs are reassuring.  No evidence of acute cystitis.  Patient and daughter are reassured by negative work-up and choose to be discharged home.  They were offered admission due to increasing weakness however declined.  Return precautions were discussed.  She is to follow-up with her primary care provider this  week. ____________________________________________   FINAL CLINICAL IMPRESSION(S) / ED DIAGNOSES  Final diagnoses:  Generalized weakness     ED Discharge Orders    None       Taylor Watson was evaluated in Emergency Department on 11/04/2019 for the symptoms described in the history of present illness. She was evaluated in the context of the global COVID-19 pandemic, which necessitated consideration that the patient might be at risk for infection with the SARS-CoV-2 virus that causes COVID-19. Institutional protocols and algorithms that pertain to the evaluation of patients at risk for COVID-19 are in a state of rapid change based on information released by regulatory bodies including the CDC and federal and state organizations. These policies and algorithms were followed during the patient's care in the ED.   Note:  This document was prepared using Dragon voice recognition software and may include unintentional dictation errors.  Victorino Dike, FNP 11/04/19 1940    Arta Silence, MD 11/04/19 2202

## 2019-11-04 NOTE — ED Triage Notes (Addendum)
Pt to ER with c/o "not breathing too good and can't hold my kidneys."  Pt also reports lightheadedness.  All symptoms noted yesterday.  NAD noted in triage.  Daughter reports feeling dizzy and weak for last week.

## 2019-11-04 NOTE — Discharge Instructions (Signed)
Please follow up with your primary care provider next week.  Return to the ER for symptoms that change or worsen if unable to schedule an appointment.

## 2019-11-04 NOTE — ED Notes (Signed)
Pt cleaned, changed out of wet clothing and and new linen placed on bed.

## 2019-11-09 DIAGNOSIS — Z8673 Personal history of transient ischemic attack (TIA), and cerebral infarction without residual deficits: Secondary | ICD-10-CM | POA: Diagnosis not present

## 2019-11-09 DIAGNOSIS — J9601 Acute respiratory failure with hypoxia: Secondary | ICD-10-CM | POA: Diagnosis not present

## 2019-11-09 DIAGNOSIS — Z9181 History of falling: Secondary | ICD-10-CM | POA: Diagnosis not present

## 2019-11-09 DIAGNOSIS — J9611 Chronic respiratory failure with hypoxia: Secondary | ICD-10-CM | POA: Diagnosis not present

## 2019-11-09 DIAGNOSIS — I5031 Acute diastolic (congestive) heart failure: Secondary | ICD-10-CM | POA: Diagnosis not present

## 2019-11-15 DIAGNOSIS — R0602 Shortness of breath: Secondary | ICD-10-CM | POA: Diagnosis not present

## 2019-11-20 ENCOUNTER — Other Ambulatory Visit: Payer: Self-pay | Admitting: Internal Medicine

## 2019-11-23 ENCOUNTER — Other Ambulatory Visit: Payer: Self-pay | Admitting: Internal Medicine

## 2019-11-28 NOTE — Progress Notes (Signed)
Patient ID: Taylor Watson, female    DOB: 02/21/1919, 86 y.o.   MRN: [id number]  HPI  Taylor Watson is a 86 y/o female with a history of hyperlipidemia, HTN, stroke, anemia, GERD, depression, previous tobacco use and chronic heart failure.   Echo report from 07/19/19 reviewed and showed an EF of 55-60% with moderate LVH, mild MR and severely elevated PA pressure.   Was in the ED 11/04/19 due to shortness of breath and weakness. CXR and labs were negative and she was released. Admitted 07/19/19 due to acute HF exacerbation. Received IV lasix initially with net loss of 7L. Transitioned to oral diuretics. Discharged after 6 days.   She presents today for a follow-up visit with a chief complaint of moderate shortness of breath upon minimal exertion. She describes this as chronic in nature having been present for several years. She has associated fatigue, intermittent pedal edema, slight weight gain and light-headedness along with this. She denies any difficulty sleeping, abdominal distention, palpitations, chest pain or cough.   Says that she's been off her metoprolol for ~ 1 month because she was unable to get a new RX from her PCP.   Past Medical History:  Diagnosis Date  . Allergy   . Anemia   . Arrhythmia   . CHF (congestive heart failure) ([place])   . CVA (cerebral vascular accident) ([place])   . Depression   . GERD (gastroesophageal reflux disease)   . History of blood transfusion   . History of kidney stones   . Hx: UTI (urinary tract infection)   . Hypercholesterolemia   . Hypertension    Past Surgical History:  Procedure Laterality Date  . CHOLECYSTECTOMY  1984  . NECK LESION BIOPSY  2015   Followed by Dr. Richardson Landry   Family History  Problem Relation Age of Onset  . Cancer Mother        unknown type  . Hypertension Mother   . Cancer Father        unknown type  . Cancer Daughter   . Colon cancer Other        grandfather   Social History   Tobacco Use  . Smoking status:  Former Research scientist (life sciences)  . Smokeless tobacco: Former Systems developer    Types: Chew  Substance Use Topics  . Alcohol use: No    Alcohol/week: 0.0 standard drinks   Allergies  Allergen Reactions  . Dyazide [Hydrochlorothiazide W-Triamterene]   . Monopril [Fosinopril]   . Toprol Xl [Metoprolol Tartrate]   . Verapamil    Prior to Admission medications   Medication Sig Start Date End Date Taking? Authorizing Provider  aspirin EC 81 MG EC tablet Take 1 tablet (81 mg total) by mouth daily. 05/20/19  Yes Lavina Hamman, MD  calcium carbonate (TUMS - DOSED IN MG ELEMENTAL CALCIUM) 500 MG chewable tablet Chew 1 tablet (200 mg of elemental calcium total) by mouth 3 (three) times daily as needed for indigestion or heartburn. 07/25/19  Yes Black, Lezlie Octave, NP  ENTRESTO 24-26 MG TAKE 1 TABLET BY MOUTH 2 (TWO) TIMES DAILY. 11/20/19  Yes [name], MD  escitalopram (LEXAPRO) 10 MG tablet Take 0.5 tablets (5 mg total) by mouth daily. 06/19/19  Yes [name], MD  furosemide (LASIX) 40 MG tablet Take 0.5 tablets (20 mg total) by mouth daily. 09/13/19 09/12/20 Yes [name], MD  metoprolol tartrate (LOPRESSOR) 25 MG tablet Take 0.5 tablets (12.5 mg total) by mouth 2 (two) times daily. Patient not taking: Reported  on 11/29/2019 09/13/19   [name], MD     Review of Systems  Constitutional: Positive for fatigue. Negative for appetite change.  HENT: Negative for congestion, postnasal drip and sore throat.   Eyes: Negative.   Respiratory: Positive for shortness of breath (with little exertion). Negative for cough.   Cardiovascular: Positive for leg swelling (sometimes around ankles). Negative for chest pain and palpitations.  Gastrointestinal: Negative for abdominal distention and abdominal pain.  Endocrine: Negative.   Genitourinary: Negative.   Musculoskeletal: Negative for back pain and neck pain.  Skin: Negative.   Allergic/Immunologic: Negative.   Neurological: Positive for light-headedness. Negative  for dizziness.  Hematological: Negative for adenopathy. Does not bruise/bleed easily.  Psychiatric/Behavioral: Negative for dysphoric mood and sleep disturbance (sleeping on 2 pillows). The patient is not nervous/anxious.    Vitals:   11/29/19 1024  BP: (!) 175/84  Pulse: 64  Resp: 15  SpO2: 90%  Weight: 114 lb (51.7 kg)  Height: 5' (1.524 m)   Wt Readings from Last 3 Encounters:  11/29/19 114 lb (51.7 kg)  11/04/19 115 lb (52.2 kg)  10/12/19 105 lb (47.6 kg)   Lab Results  Component Value Date   CREATININE 0.80 11/04/2019   CREATININE 0.91 08/28/2019   CREATININE 0.81 07/25/2019    Physical Exam Vitals and nursing note reviewed.  Constitutional:      Appearance: She is well-developed.  HENT:     Head: Normocephalic and atraumatic.  Neck:     Vascular: No JVD.  Cardiovascular:     Rate and Rhythm: Normal rate and regular rhythm.  Pulmonary:     Effort: Pulmonary effort is normal. No respiratory distress.     Breath sounds: No wheezing or rales.  Musculoskeletal:     Cervical back: Normal range of motion and neck supple.     Right lower leg: No tenderness. No edema.     Left lower leg: No tenderness. No edema.  Skin:    General: Skin is warm and dry.  Neurological:     General: No focal deficit present.     Mental Status: She is alert and oriented to person, place, and time.  Psychiatric:        Mood and Affect: Mood normal.        Behavior: Behavior normal.    Assessment & Plan:  1: Chronic heart failure with preserved ejection fraction and structural changes of moderate LVH- - NYHA class III - euvolemic today - weighing daily; reminded to call for an overnight weight gain of >2 pounds or a weekly weight gain of >5 pounds - weight up 9 pounds from last visit here 3 months ago - patient says that she does add salt to some foods and her daughter says that she doesn't cook food with salt; encouraged her to not add any salt to her food - increase entresto to  49/51mg  BID; advised patient and daughter that she could finish her current bottle by taking 2 tablets BID and then once she gets the new RX picked up, she will then go back to taking 1 tablet BID - check BMP today & recheck at her next visit - will not resume metoprolol at this time as HR is 64 - wearing oxygen as needed at home - BNP 11/04/19 was 393.4  2: HTN- - BP elevated; increasing entresto per above - had telemedicine visit with PCP Nicki Reaper) 10/12/19 - BMP from 11/04/19 reviewed and showed sodium 141, potassium 3.2, creatinine 0.8 and GFR >  60   Medication bottles reviewed.   Return in 1 month or sooner for any questions/problems before then.

## 2019-11-29 ENCOUNTER — Other Ambulatory Visit: Payer: Self-pay

## 2019-11-29 ENCOUNTER — Telehealth: Payer: Self-pay | Admitting: Family

## 2019-11-29 ENCOUNTER — Encounter: Payer: Self-pay | Admitting: Family

## 2019-11-29 ENCOUNTER — Ambulatory Visit: Payer: Medicare Other | Attending: Family | Admitting: Family

## 2019-11-29 VITALS — BP 175/84 | HR 64 | Resp 15 | Ht 60.0 in | Wt 114.0 lb

## 2019-11-29 DIAGNOSIS — Z87891 Personal history of nicotine dependence: Secondary | ICD-10-CM | POA: Diagnosis not present

## 2019-11-29 DIAGNOSIS — Z7982 Long term (current) use of aspirin: Secondary | ICD-10-CM | POA: Insufficient documentation

## 2019-11-29 DIAGNOSIS — F329 Major depressive disorder, single episode, unspecified: Secondary | ICD-10-CM | POA: Diagnosis not present

## 2019-11-29 DIAGNOSIS — R0602 Shortness of breath: Secondary | ICD-10-CM | POA: Diagnosis not present

## 2019-11-29 DIAGNOSIS — R42 Dizziness and giddiness: Secondary | ICD-10-CM | POA: Insufficient documentation

## 2019-11-29 DIAGNOSIS — I5032 Chronic diastolic (congestive) heart failure: Secondary | ICD-10-CM | POA: Diagnosis not present

## 2019-11-29 DIAGNOSIS — E785 Hyperlipidemia, unspecified: Secondary | ICD-10-CM | POA: Diagnosis not present

## 2019-11-29 DIAGNOSIS — Z8673 Personal history of transient ischemic attack (TIA), and cerebral infarction without residual deficits: Secondary | ICD-10-CM | POA: Diagnosis not present

## 2019-11-29 DIAGNOSIS — Z79899 Other long term (current) drug therapy: Secondary | ICD-10-CM | POA: Diagnosis not present

## 2019-11-29 DIAGNOSIS — I110 Hypertensive heart disease with heart failure: Secondary | ICD-10-CM | POA: Insufficient documentation

## 2019-11-29 DIAGNOSIS — K219 Gastro-esophageal reflux disease without esophagitis: Secondary | ICD-10-CM | POA: Diagnosis not present

## 2019-11-29 DIAGNOSIS — R5383 Other fatigue: Secondary | ICD-10-CM | POA: Insufficient documentation

## 2019-11-29 DIAGNOSIS — I10 Essential (primary) hypertension: Secondary | ICD-10-CM

## 2019-11-29 DIAGNOSIS — E7800 Pure hypercholesterolemia, unspecified: Secondary | ICD-10-CM | POA: Diagnosis not present

## 2019-11-29 DIAGNOSIS — Z8249 Family history of ischemic heart disease and other diseases of the circulatory system: Secondary | ICD-10-CM | POA: Diagnosis not present

## 2019-11-29 LAB — BASIC METABOLIC PANEL
Anion gap: 11 (ref 5–15)
BUN: 21 mg/dL (ref 8–23)
CO2: 36 mmol/L — ABNORMAL HIGH (ref 22–32)
Calcium: 9.3 mg/dL (ref 8.9–10.3)
Chloride: 94 mmol/L — ABNORMAL LOW (ref 98–111)
Creatinine, Ser: 1.06 mg/dL — ABNORMAL HIGH (ref 0.44–1.00)
GFR calc Af Amer: 51 mL/min — ABNORMAL LOW (ref >60)
GFR calc non Af Amer: 44 mL/min — ABNORMAL LOW (ref >60)
Glucose, Bld: 96 mg/dL (ref 70–99)
Potassium: 3.1 mmol/L — ABNORMAL LOW (ref 3.5–5.1)
Sodium: 141 mmol/L (ref 135–145)

## 2019-11-29 MED ORDER — SACUBITRIL-VALSARTAN 49-51 MG PO TABS: 1.0000 | ORAL_TABLET | Freq: Two times a day (BID) | ORAL | 5 refills | Status: DC

## 2019-11-29 MED ORDER — POTASSIUM CHLORIDE CRYS ER 20 MEQ PO TBCR
20.0000 meq | EXTENDED_RELEASE_TABLET | Freq: Every day | ORAL | 5 refills | Status: DC
Start: 2019-11-29 — End: 2019-12-04

## 2019-11-29 NOTE — Patient Instructions (Addendum)
Continue weighing daily and call for an overnight weight gain of > 2 pounds or a weekly weight gain of >5 pounds.   Finish your current entresto bottle by taking 2 tablets in the morning and 2 tablets in the evening. When you finish your current bottle and then pick up the new prescription of entresto 49/51mg , you will then resume taking 1 tablet in the morning and 1 tablet in the evening.

## 2019-11-29 NOTE — Telephone Encounter (Signed)
Spoke with patient's daughter, [name], regarding patient's BMP results from earlier today. Potassium is low at 3.1 so will call in potassium 67meq to take 1 tablet by mouth once daily. Will recheck lab work at her next visit. [name] was comfortable with this plan.

## 2019-12-04 ENCOUNTER — Telehealth: Payer: Self-pay | Admitting: Internal Medicine

## 2019-12-04 ENCOUNTER — Other Ambulatory Visit: Payer: Self-pay | Admitting: Family

## 2019-12-04 MED ORDER — POTASSIUM CHLORIDE 20 MEQ/15ML (10%) PO SOLN: 20.0000 meq | Freq: Every day | ORAL | 5 refills | Status: DC

## 2019-12-04 NOTE — Telephone Encounter (Signed)
Zach from [facility] is following up on a prescription for incontinence supplies for patient that was faxed over on 11/27/19. I asked him to refax request in case you don't have it. Their fax number is [phone].

## 2019-12-05 DIAGNOSIS — I10 Essential (primary) hypertension: Secondary | ICD-10-CM | POA: Diagnosis not present

## 2019-12-07 NOTE — Telephone Encounter (Signed)
Discussed this with you this afternoon.

## 2019-12-08 NOTE — Telephone Encounter (Signed)
Form signed and placed in box.  Will need to send my last office note.  Can also send cardiology (or [facility]) note as well.

## 2019-12-10 DIAGNOSIS — J9611 Chronic respiratory failure with hypoxia: Secondary | ICD-10-CM | POA: Diagnosis not present

## 2019-12-10 DIAGNOSIS — Z9181 History of falling: Secondary | ICD-10-CM | POA: Diagnosis not present

## 2019-12-10 DIAGNOSIS — I5031 Acute diastolic (congestive) heart failure: Secondary | ICD-10-CM | POA: Diagnosis not present

## 2019-12-10 DIAGNOSIS — Z8673 Personal history of transient ischemic attack (TIA), and cerebral infarction without residual deficits: Secondary | ICD-10-CM | POA: Diagnosis not present

## 2019-12-10 DIAGNOSIS — J9601 Acute respiratory failure with hypoxia: Secondary | ICD-10-CM | POA: Diagnosis not present

## 2019-12-10 NOTE — Telephone Encounter (Signed)
Form and office notes have been faxed.

## 2019-12-16 DIAGNOSIS — R0602 Shortness of breath: Secondary | ICD-10-CM | POA: Diagnosis not present

## 2019-12-19 ENCOUNTER — Other Ambulatory Visit: Payer: Self-pay | Admitting: Internal Medicine

## 2019-12-29 NOTE — Progress Notes (Signed)
Patient ID: CHARLIEGH VASUDEVAN, female    DOB: 16-Jan-1919, 86 y.o.   MRN: [id number]  HPI  Ms Hickam is a 86 y/o female with a history of hyperlipidemia, HTN, stroke, anemia, GERD, depression, previous tobacco use and chronic heart failure.   Echo report from 07/19/19 reviewed and showed an EF of 55-60% with moderate LVH, mild MR and severely elevated PA pressure.   Was in the ED 11/04/19 due to shortness of breath and weakness. CXR and labs were negative and she was released. Admitted 07/19/19 due to acute HF exacerbation. Received IV lasix initially with net loss of 7L. Transitioned to oral diuretics. Discharged after 6 days.   She presents today for a follow-up visit with a chief complaint of moderate shortness of breath with minimal exertion. She describes this as chronic in nature having been present for several years and doesn't feel like it's any worse. She has associated fatigue, decreased appetite and light-headedness along with this. She denies any difficulty sleeping, abdominal distention, palpitations, pedal edema, chest pain, cough or weight gain.   Daughter, [name], that is present with her says that patient sometimes doesn't eat but a few bites and then doesn't want anymore. Has noticed a gradual weight loss. Has started liquid potassium since last visit along with entresto titration.   Past Medical History:  Diagnosis Date  . Allergy   . Anemia   . Arrhythmia   . CHF (congestive heart failure) ([place])   . CVA (cerebral vascular accident) ([place])   . Depression   . GERD (gastroesophageal reflux disease)   . History of blood transfusion   . History of kidney stones   . Hx: UTI (urinary tract infection)   . Hypercholesterolemia   . Hypertension    Past Surgical History:  Procedure Laterality Date  . CHOLECYSTECTOMY  1984  . NECK LESION BIOPSY  2015   Followed by Dr. Richardson Landry   Family History  Problem Relation Age of Onset  . Cancer Mother        unknown type  .  Hypertension Mother   . Cancer Father        unknown type  . Cancer Daughter   . Colon cancer Other        grandfather   Social History   Tobacco Use  . Smoking status: Former Research scientist (life sciences)  . Smokeless tobacco: Former Systems developer    Types: Chew  Substance Use Topics  . Alcohol use: No    Alcohol/week: 0.0 standard drinks   Allergies  Allergen Reactions  . Dyazide [Hydrochlorothiazide W-Triamterene]   . Monopril [Fosinopril]   . Toprol Xl [Metoprolol Tartrate]   . Verapamil     Prior to Admission medications   Medication Sig Start Date End Date Taking? Authorizing Provider  aspirin EC 81 MG EC tablet Take 1 tablet (81 mg total) by mouth daily. 05/20/19  Yes Lavina Hamman, MD  calcium carbonate (TUMS - DOSED IN MG ELEMENTAL CALCIUM) 500 MG chewable tablet Chew 1 tablet (200 mg of elemental calcium total) by mouth 3 (three) times daily as needed for indigestion or heartburn. 07/25/19  Yes Black, Lezlie Octave, NP  escitalopram (LEXAPRO) 10 MG tablet Take 0.5 tablets (5 mg total) by mouth daily. 06/19/19  Yes [name], MD  furosemide (LASIX) 40 MG tablet Take 0.5 tablets (20 mg total) by mouth daily. 09/13/19 09/12/20 Yes [name], MD  metoprolol tartrate (LOPRESSOR) 25 MG tablet Take 0.5 tablets (12.5 mg total) by mouth 2 (two) times  daily. 09/13/19  Yes [name], MD  potassium chloride 20 MEQ/15ML (10%) SOLN Take 15 mLs (20 mEq total) by mouth daily. 12/04/19  Yes Maleka Contino A, FNP  sacubitril-valsartan (ENTRESTO) 49-51 MG Take 1 tablet by mouth 2 (two) times daily. 11/29/19  Yes Alisa Graff, FNP    Review of Systems  Constitutional: Positive for appetite change (decreased) and fatigue.  HENT: Negative for congestion, postnasal drip and sore throat.   Eyes: Negative.   Respiratory: Positive for shortness of breath (with little exertion). Negative for cough.   Cardiovascular: Negative for chest pain, palpitations and leg swelling.  Gastrointestinal: Negative for abdominal  distention and abdominal pain.  Endocrine: Negative.   Genitourinary: Negative.  Hematuria:    Musculoskeletal: Negative for back pain and neck pain.  Skin: Negative.   Allergic/Immunologic: Negative.   Neurological: Positive for light-headedness. Negative for dizziness.  Hematological: Negative for adenopathy. Does not bruise/bleed easily.  Psychiatric/Behavioral: Negative for dysphoric mood and sleep disturbance (sleeping on 2 pillows). The patient is not nervous/anxious.    Vitals:   12/31/19 1158  BP: (!) 80/57  Pulse: 82  Resp: 16  SpO2: 95%  Weight: 99 lb (44.9 kg)  Height: 5\' 5"  (1.651 m)   Wt Readings from Last 3 Encounters:  12/31/19 99 lb (44.9 kg)  11/29/19 114 lb (51.7 kg)  11/04/19 115 lb (52.2 kg)   Lab Results  Component Value Date   CREATININE 1.06 (H) 11/29/2019   CREATININE 0.80 11/04/2019   CREATININE 0.91 08/28/2019    Physical Exam Vitals and nursing note reviewed.  Constitutional:      Appearance: She is well-developed.  HENT:     Head: Normocephalic and atraumatic.  Neck:     Vascular: No JVD.  Cardiovascular:     Rate and Rhythm: Normal rate and regular rhythm.  Pulmonary:     Effort: Pulmonary effort is normal. No respiratory distress.     Breath sounds: No wheezing or rales.  Musculoskeletal:     Cervical back: Normal range of motion and neck supple.     Right lower leg: No tenderness. No edema.     Left lower leg: No tenderness. No edema.  Skin:    General: Skin is warm and dry.  Neurological:     General: No focal deficit present.     Mental Status: She is alert and oriented to person, place, and time.  Psychiatric:        Mood and Affect: Mood normal.        Behavior: Behavior normal.    Assessment & Plan:  1: Chronic heart failure with preserved ejection fraction and structural changes of moderate LVH- - NYHA class III - euvolemic today - weighing daily; reminded to call for an overnight weight gain of >2 pounds or a weekly  weight gain of >5 pounds - weight down 15 pounds from last visit here 1 month ago - patient says that she does add salt to some foods and her daughter says that she doesn't cook food with salt; encouraged her to not add any salt to her food - entresto increased to 49/51mg  BID at last visit; will decrease it back down to 24/26mg  BID due to low BP - check BMP today as potassium was started at last visit - wearing oxygen as needed at home - BNP 11/04/19 was 393.4  2: HTN- - BP low even upon recheck; patient denies that her light-headedness is any worse - decreasing entresto per above and  will also have patient hold entresto for 2 days with resumption at the lower dose on Thursday 01/03/20 - had telemedicine visit with PCP Nicki Reaper) 10/12/19 - BMP from 11/29/19 reviewed and showed sodium 141, potassium 3.1, creatinine 1.06 and GFR 51  3: Weight loss- - patient has lost 15 pounds by our scale  - admits to not being hungry and not really wanting anything to eat - encouraged her to drink 1 can of ensure daily - will also send a message to patient's PCP about her weight loss   Patient did not bring her medications nor a list. Each medication was verbally reviewed with the patient and she was encouraged to bring the bottles to every visit to confirm accuracy of list.  Return in 1 month or sooner for any questions/problems before then.

## 2019-12-31 ENCOUNTER — Other Ambulatory Visit: Payer: Self-pay

## 2019-12-31 ENCOUNTER — Ambulatory Visit: Payer: Medicare Other | Attending: Family | Admitting: Family

## 2019-12-31 ENCOUNTER — Telehealth: Payer: Self-pay | Admitting: Family

## 2019-12-31 ENCOUNTER — Encounter: Payer: Self-pay | Admitting: Family

## 2019-12-31 VITALS — BP 80/57 | HR 82 | Resp 16 | Ht 65.0 in | Wt 99.0 lb

## 2019-12-31 DIAGNOSIS — Z79899 Other long term (current) drug therapy: Secondary | ICD-10-CM | POA: Diagnosis not present

## 2019-12-31 DIAGNOSIS — D649 Anemia, unspecified: Secondary | ICD-10-CM | POA: Insufficient documentation

## 2019-12-31 DIAGNOSIS — I5032 Chronic diastolic (congestive) heart failure: Secondary | ICD-10-CM | POA: Diagnosis not present

## 2019-12-31 DIAGNOSIS — Z9049 Acquired absence of other specified parts of digestive tract: Secondary | ICD-10-CM | POA: Diagnosis not present

## 2019-12-31 DIAGNOSIS — Z681 Body mass index (BMI) 19 or less, adult: Secondary | ICD-10-CM | POA: Diagnosis not present

## 2019-12-31 DIAGNOSIS — Z87891 Personal history of nicotine dependence: Secondary | ICD-10-CM | POA: Diagnosis not present

## 2019-12-31 DIAGNOSIS — I110 Hypertensive heart disease with heart failure: Secondary | ICD-10-CM | POA: Diagnosis not present

## 2019-12-31 DIAGNOSIS — R630 Anorexia: Secondary | ICD-10-CM | POA: Diagnosis not present

## 2019-12-31 DIAGNOSIS — E7800 Pure hypercholesterolemia, unspecified: Secondary | ICD-10-CM | POA: Diagnosis not present

## 2019-12-31 DIAGNOSIS — F329 Major depressive disorder, single episode, unspecified: Secondary | ICD-10-CM | POA: Diagnosis not present

## 2019-12-31 DIAGNOSIS — K219 Gastro-esophageal reflux disease without esophagitis: Secondary | ICD-10-CM | POA: Insufficient documentation

## 2019-12-31 DIAGNOSIS — R634 Abnormal weight loss: Secondary | ICD-10-CM | POA: Diagnosis not present

## 2019-12-31 DIAGNOSIS — Z8249 Family history of ischemic heart disease and other diseases of the circulatory system: Secondary | ICD-10-CM | POA: Diagnosis not present

## 2019-12-31 DIAGNOSIS — Z8673 Personal history of transient ischemic attack (TIA), and cerebral infarction without residual deficits: Secondary | ICD-10-CM | POA: Insufficient documentation

## 2019-12-31 DIAGNOSIS — E785 Hyperlipidemia, unspecified: Secondary | ICD-10-CM | POA: Insufficient documentation

## 2019-12-31 DIAGNOSIS — Z7982 Long term (current) use of aspirin: Secondary | ICD-10-CM | POA: Insufficient documentation

## 2019-12-31 DIAGNOSIS — I10 Essential (primary) hypertension: Secondary | ICD-10-CM

## 2019-12-31 LAB — BASIC METABOLIC PANEL
Anion gap: 8 (ref 5–15)
BUN: 46 mg/dL — ABNORMAL HIGH (ref 8–23)
CO2: 28 mmol/L (ref 22–32)
Calcium: 9.5 mg/dL (ref 8.9–10.3)
Chloride: 99 mmol/L (ref 98–111)
Creatinine, Ser: 1.57 mg/dL — ABNORMAL HIGH (ref 0.44–1.00)
GFR calc Af Amer: 31 mL/min — ABNORMAL LOW (ref >60)
GFR calc non Af Amer: 27 mL/min — ABNORMAL LOW (ref >60)
Glucose, Bld: 100 mg/dL — ABNORMAL HIGH (ref 70–99)
Potassium: 4.3 mmol/L (ref 3.5–5.1)
Sodium: 135 mmol/L (ref 135–145)

## 2019-12-31 NOTE — Telephone Encounter (Signed)
Spoke with [name] regarding patient's lab results from today. Potassium level is now normal but renal function has worsened. Earlier today when patient was seen, had explained that we needed to decrease her entresto back to 24/26mg  BID along with holding it for the next 2 days. Based on worsening renal function, will continue with those directions.   Will plan on rechecking lab work at her next appointment unless PCP sees her prior to that.   [name] verbalized understanding of instructions and repeated back the directions to hold entresto completely for the next 2 days and then resume it at 24/26mg  BID.

## 2019-12-31 NOTE — Patient Instructions (Addendum)
Continue weighing daily and call for an overnight weight gain of > 2 pounds or a weekly weight gain of >5 pounds.   Decrease entresto to 1/2 tablet twice daily.   Drink 1 can of ensure daily.

## 2020-01-01 ENCOUNTER — Telehealth: Payer: Self-pay | Admitting: Internal Medicine

## 2020-01-01 NOTE — Telephone Encounter (Signed)
I received a message from [facility] (heart failure clinic) regarding pts weight loss, increased creatinine and decreased po intake.  Needs a f/u appt with me.  I can see her next week - we gave a 12:00 on 01/08/20 and 12:00 on 01/11/20.

## 2020-01-01 NOTE — Telephone Encounter (Signed)
Message sent to Larena Glassman to schedule f/u appt. with me

## 2020-01-01 NOTE — Telephone Encounter (Signed)
Will you please call and schedule her in one of these spots?

## 2020-01-01 NOTE — Telephone Encounter (Signed)
Called and spoke to Pt daughter and scheduled for 9/7

## 2020-01-05 DIAGNOSIS — I10 Essential (primary) hypertension: Secondary | ICD-10-CM | POA: Diagnosis not present

## 2020-01-08 ENCOUNTER — Ambulatory Visit: Payer: Medicare Other | Admitting: Internal Medicine

## 2020-01-10 ENCOUNTER — Other Ambulatory Visit: Payer: Self-pay | Admitting: Internal Medicine

## 2020-01-10 ENCOUNTER — Other Ambulatory Visit: Payer: Self-pay

## 2020-01-10 ENCOUNTER — Encounter: Payer: Self-pay | Admitting: Internal Medicine

## 2020-01-10 ENCOUNTER — Ambulatory Visit (INDEPENDENT_AMBULATORY_CARE_PROVIDER_SITE_OTHER): Payer: Medicare Other | Admitting: Internal Medicine

## 2020-01-10 VITALS — BP 106/62 | Temp 97.8°F | Resp 16 | Wt 100.0 lb

## 2020-01-10 DIAGNOSIS — E7800 Pure hypercholesterolemia, unspecified: Secondary | ICD-10-CM

## 2020-01-10 DIAGNOSIS — Z8673 Personal history of transient ischemic attack (TIA), and cerebral infarction without residual deficits: Secondary | ICD-10-CM | POA: Diagnosis not present

## 2020-01-10 DIAGNOSIS — D3610 Benign neoplasm of peripheral nerves and autonomic nervous system, unspecified: Secondary | ICD-10-CM

## 2020-01-10 DIAGNOSIS — R634 Abnormal weight loss: Secondary | ICD-10-CM | POA: Diagnosis not present

## 2020-01-10 DIAGNOSIS — J9611 Chronic respiratory failure with hypoxia: Secondary | ICD-10-CM | POA: Diagnosis not present

## 2020-01-10 DIAGNOSIS — F320 Major depressive disorder, single episode, mild: Secondary | ICD-10-CM

## 2020-01-10 DIAGNOSIS — J9601 Acute respiratory failure with hypoxia: Secondary | ICD-10-CM | POA: Diagnosis not present

## 2020-01-10 DIAGNOSIS — E875 Hyperkalemia: Secondary | ICD-10-CM

## 2020-01-10 DIAGNOSIS — I4891 Unspecified atrial fibrillation: Secondary | ICD-10-CM

## 2020-01-10 DIAGNOSIS — I10 Essential (primary) hypertension: Secondary | ICD-10-CM | POA: Diagnosis not present

## 2020-01-10 DIAGNOSIS — F419 Anxiety disorder, unspecified: Secondary | ICD-10-CM

## 2020-01-10 DIAGNOSIS — Z9181 History of falling: Secondary | ICD-10-CM | POA: Diagnosis not present

## 2020-01-10 DIAGNOSIS — I5031 Acute diastolic (congestive) heart failure: Secondary | ICD-10-CM | POA: Diagnosis not present

## 2020-01-10 DIAGNOSIS — F32A Depression, unspecified: Secondary | ICD-10-CM

## 2020-01-10 LAB — HEPATIC FUNCTION PANEL
ALT: 9 U/L (ref 0–35)
AST: 14 U/L (ref 0–37)
Albumin: 4.3 g/dL (ref 3.5–5.2)
Alkaline Phosphatase: 80 U/L (ref 39–117)
Bilirubin, Direct: 0.1 mg/dL (ref 0.0–0.3)
Total Bilirubin: 0.7 mg/dL (ref 0.2–1.2)
Total Protein: 7.2 g/dL (ref 6.0–8.3)

## 2020-01-10 LAB — BASIC METABOLIC PANEL
BUN: 39 mg/dL — ABNORMAL HIGH (ref 6–23)
CO2: 32 mEq/L (ref 19–32)
Calcium: 10.1 mg/dL (ref 8.4–10.5)
Chloride: 100 mEq/L (ref 96–112)
Creatinine, Ser: 1.19 mg/dL (ref 0.40–1.20)
GFR: 50.57 mL/min — ABNORMAL LOW (ref >60.00)
Glucose, Bld: 73 mg/dL (ref 70–99)
Potassium: 5.4 mEq/L — ABNORMAL HIGH (ref 3.5–5.1)
Sodium: 139 mEq/L (ref 135–145)

## 2020-01-10 LAB — TSH: TSH: 3.35 u[IU]/mL (ref 0.35–4.50)

## 2020-01-10 NOTE — Progress Notes (Signed)
Patient ID: Taylor Watson, female   DOB: January 06, 1919, 86 y.o.   MRN: [id number]   Subjective:    Patient ID: Taylor Watson, female    DOB: Oct 05, 1918, 86 y.o.   MRN: [id number]  HPI This visit occurred during the SARS-CoV-2 public health emergency.  Safety protocols were in place, including screening questions prior to the visit, additional usage of staff PPE, and extensive cleaning of exam room while observing appropriate contact time as indicated for disinfecting solutions.  Patient here for a scheduled follow up.  She is accompanied by her daughter.  History obtained from both of them.  She feels her breathing is stable.  No chest pain reported.  Uses oxygen prn at home.  No abdominal pain.  Bowels moving.  Some urinary leakage when she stands up.  Wears Depends.  Taking entresto.  Overall feels things are stable.     Past Medical History:  Diagnosis Date  . Allergy   . Anemia   . Arrhythmia   . CHF (congestive heart failure) ([place])   . CVA (cerebral vascular accident) ([place])   . Depression   . GERD (gastroesophageal reflux disease)   . History of blood transfusion   . History of kidney stones   . Hx: UTI (urinary tract infection)   . Hypercholesterolemia   . Hypertension    Past Surgical History:  Procedure Laterality Date  . CHOLECYSTECTOMY  1984  . NECK LESION BIOPSY  2015   Followed by Dr. Richardson Landry   Family History  Problem Relation Age of Onset  . Cancer Mother        unknown type  . Hypertension Mother   . Cancer Father        unknown type  . Cancer Daughter   . Colon cancer Other        grandfather   Social History   Socioeconomic History  . Marital status: Widowed    Spouse name: Not on file  . Number of children: 5  . Years of education: Not on file  . Highest education level: Not on file  Occupational History  . Not on file  Tobacco Use  . Smoking status: Former Research scientist (life sciences)  . Smokeless tobacco: Former Systems developer    Types: Chew  Substance and Sexual  Activity  . Alcohol use: No    Alcohol/week: 0.0 standard drinks  . Drug use: No  . Sexual activity: Not Currently  Other Topics Concern  . Not on file  Social History Narrative  . Not on file   Social Determinants of Health   Financial Resource Strain:   . Difficulty of Paying Living Expenses: Not on file  Food Insecurity:   . Worried About Charity fundraiser in the Last Year: Not on file  . Ran Out of Food in the Last Year: Not on file  Transportation Needs:   . Lack of Transportation (Medical): Not on file  . Lack of Transportation (Non-Medical): Not on file  Physical Activity:   . Days of Exercise per Week: Not on file  . Minutes of Exercise per Session: Not on file  Stress:   . Feeling of Stress : Not on file  Social Connections:   . Frequency of Communication with Friends and Family: Not on file  . Frequency of Social Gatherings with Friends and Family: Not on file  . Attends Religious Services: Not on file  . Active Member of Clubs or Organizations: Not on file  . Attends Archivist  Meetings: Not on file  . Marital Status: Not on file    Outpatient Encounter Medications as of 01/10/2020  Medication Sig  . aspirin EC 81 MG EC tablet Take 1 tablet (81 mg total) by mouth daily.  . calcium carbonate (TUMS - DOSED IN MG ELEMENTAL CALCIUM) 500 MG chewable tablet Chew 1 tablet (200 mg of elemental calcium total) by mouth 3 (three) times daily as needed for indigestion or heartburn.  . escitalopram (LEXAPRO) 10 MG tablet Take 0.5 tablets (5 mg total) by mouth daily.  . furosemide (LASIX) 40 MG tablet Take 0.5 tablets (20 mg total) by mouth daily.  . potassium chloride 20 MEQ/15ML (10%) SOLN Take 15 mLs (20 mEq total) by mouth daily.  . sacubitril-valsartan (ENTRESTO) 49-51 MG Take 1 tablet by mouth 2 (two) times daily. (Patient taking differently: Take 0.5 tablets by mouth 2 (two) times daily. )  . [DISCONTINUED] metoprolol tartrate (LOPRESSOR) 25 MG tablet Take 0.5  tablets (12.5 mg total) by mouth 2 (two) times daily.   No facility-administered encounter medications on file as of 01/10/2020.    Review of Systems  Constitutional: Negative for appetite change and unexpected weight change.  HENT: Negative for congestion and sinus pressure.   Respiratory: Negative for cough and chest tightness.        Breathing stable.   Cardiovascular: Negative for chest pain, palpitations and leg swelling.  Gastrointestinal: Negative for abdominal pain, diarrhea, nausea and vomiting.  Genitourinary: Negative for difficulty urinating and dysuria.  Musculoskeletal: Negative for joint swelling and myalgias.  Skin: Negative for color change and rash.  Neurological: Negative for dizziness, light-headedness and headaches.  Psychiatric/Behavioral: Negative for agitation and dysphoric mood.       Objective:    Physical Exam Vitals reviewed.  Constitutional:      General: She is not in acute distress.    Appearance: Normal appearance.  HENT:     Head: Normocephalic and atraumatic.     Right Ear: External ear normal.     Left Ear: External ear normal.  Eyes:     General: No scleral icterus.       Right eye: No discharge.        Left eye: No discharge.     Conjunctiva/sclera: Conjunctivae normal.  Neck:     Thyroid: No thyromegaly.  Cardiovascular:     Rate and Rhythm: Normal rate and regular rhythm.  Pulmonary:     Effort: No respiratory distress.     Breath sounds: Normal breath sounds. No wheezing.  Abdominal:     General: Bowel sounds are normal.     Palpations: Abdomen is soft.     Tenderness: There is no abdominal tenderness.  Musculoskeletal:        General: No swelling or tenderness.     Cervical back: Neck supple. No tenderness.  Lymphadenopathy:     Cervical: No cervical adenopathy.  Skin:    Findings: No erythema or rash.  Neurological:     Mental Status: She is alert.  Psychiatric:        Mood and Affect: Mood normal.        Behavior:  Behavior normal.     BP 106/62   Temp 97.8 F (36.6 C)   Resp 16   Wt 100 lb (45.4 kg)   BMI 16.64 kg/m  Wt Readings from Last 3 Encounters:  01/10/20 100 lb (45.4 kg)  12/31/19 99 lb (44.9 kg)  11/29/19 114 lb (51.7 kg)  Lab Results  Component Value Date   WBC 3.7 (L) 11/04/2019   HGB 13.2 11/04/2019   HCT 40.6 11/04/2019   PLT 245 11/04/2019   GLUCOSE 73 01/10/2020   CHOL 236 (H) 05/19/2019   TRIG 83 05/19/2019   HDL 52 05/19/2019   LDLDIRECT 163.5 01/12/2013   LDLCALC 167 (H) 05/19/2019   ALT 9 01/10/2020   AST 14 01/10/2020   NA 139 01/10/2020   K 5.0 01/11/2020   CL 100 01/10/2020   CREATININE 1.19 01/10/2020   BUN 39 (H) 01/10/2020   CO2 32 01/10/2020   TSH 3.35 01/10/2020   INR 1.38 06/01/2015   HGBA1C 5.6 05/19/2019    DG Chest 1 View  Result Date: 11/04/2019 CLINICAL DATA:  Shortness of breath and lightheadedness since yesterday. EXAM: CHEST  1 VIEW COMPARISON:  07/19/2019, 05/17/2019 and 2018 FINDINGS: Lungs are somewhat hyperexpanded with interval improvement in previously noted bibasilar opacification likely improved/resolved bilateral effusions and bibasilar atelectasis. Stable coarse bronchovascular markings over the mid to upper lungs. Mild stable cardiomegaly. Remainder of the exam is unchanged. IMPRESSION: 1. Interval improvement to resolution of previously seen bilateral effusions with bibasilar atelectasis. 2. Persistent mild coarse prominence of the bronchovascular markings over the mid to upper lungs. Findings may be due to mild vascular congestion versus acute to subacute bronchitic process. Electronically Signed   By: Marin Olp M.D.   On: 11/04/2019 16:30       Assessment & Plan:   Problem List Items Addressed This Visit    Schwannoma    Saw neurosurgery and ENT.  No further w/up warranted.  Follow stable.       Mild depression ([place])    On lexapro.  Stable.        Loss of weight    Weight stable from last check.  Follow.         Hypercholesterolemia    Follow lipid panel.       Relevant Orders   Hepatic function panel (Completed)   Essential hypertension, benign - Primary    Maintained on entresto (1/2 tablet bid) and lasix.  Blood pressure as outlined.  Follow metabolic panel.       Relevant Orders   TSH (Completed)   Basic metabolic panel (Completed)   Atrial fibrillation with rapid ventricular response ([place])    Noted in hospital.  Discharged in [place].  In SR today.  Follow.        Anxiety    Stable on lexapro.           [name], MD

## 2020-01-10 NOTE — Progress Notes (Signed)
Order placed for f/u potassium.  

## 2020-01-11 ENCOUNTER — Other Ambulatory Visit: Payer: Self-pay | Admitting: *Deleted

## 2020-01-11 ENCOUNTER — Other Ambulatory Visit
Admission: RE | Admit: 2020-01-11 | Discharge: 2020-01-11 | Disposition: A | Discharge status: Home / Self Care | Payer: Medicare Other | Attending: Internal Medicine | Admitting: Internal Medicine

## 2020-01-11 DIAGNOSIS — E875 Hyperkalemia: Secondary | ICD-10-CM

## 2020-01-11 LAB — POTASSIUM: Potassium: 5 mmol/L (ref 3.5–5.1)

## 2020-01-12 ENCOUNTER — Other Ambulatory Visit: Payer: Self-pay | Admitting: Internal Medicine

## 2020-01-12 DIAGNOSIS — E875 Hyperkalemia: Secondary | ICD-10-CM

## 2020-01-12 NOTE — Progress Notes (Signed)
Order placed for f/u met b.  

## 2020-01-16 DIAGNOSIS — R0602 Shortness of breath: Secondary | ICD-10-CM | POA: Diagnosis not present

## 2020-01-20 NOTE — Assessment & Plan Note (Signed)
Noted in hospital.  Discharged in [place].  In SR today.  Follow.

## 2020-01-20 NOTE — Assessment & Plan Note (Signed)
Saw neurosurgery and ENT.  No further w/up warranted.  Follow stable.

## 2020-01-20 NOTE — Assessment & Plan Note (Signed)
Maintained on entresto (1/2 tablet bid) and lasix.  Blood pressure as outlined.  Follow metabolic panel.

## 2020-01-20 NOTE — Assessment & Plan Note (Signed)
Follow lipid panel.   

## 2020-01-20 NOTE — Assessment & Plan Note (Signed)
Weight stable from last check.  Follow.

## 2020-01-20 NOTE — Assessment & Plan Note (Signed)
Stable on lexapro.   

## 2020-01-20 NOTE — Assessment & Plan Note (Signed)
On lexapro.  Stable.  ?

## 2020-01-21 ENCOUNTER — Other Ambulatory Visit (INDEPENDENT_AMBULATORY_CARE_PROVIDER_SITE_OTHER): Payer: Medicare Other

## 2020-01-21 ENCOUNTER — Other Ambulatory Visit: Payer: Self-pay

## 2020-01-21 DIAGNOSIS — E875 Hyperkalemia: Secondary | ICD-10-CM

## 2020-01-21 LAB — POTASSIUM: Potassium: 4 mEq/L (ref 3.5–5.1)

## 2020-01-25 NOTE — Progress Notes (Signed)
Patient ID: Taylor Watson, female    DOB: Apr 23, 1919, 86 y.o.   MRN: [id number]  HPI  Taylor Watson is a 86 y/o female with a history of hyperlipidemia, HTN, stroke, anemia, GERD, depression, previous tobacco use and chronic heart failure.   Echo report from 07/19/19 reviewed and showed an EF of 55-60% with moderate LVH, mild MR and severely elevated PA pressure.   Was in the ED 11/04/19 due to shortness of breath and weakness. CXR and labs were negative and she was released.    She presents today for a follow-up visit with a chief complaint of moderate shortness of breath upon minimal exertion. She describes this as chronic in nature having been present for several years. She has associated fatigue and occasional light-headedness along with this. She denies any difficulty sleeping, abdominal distention, palpitations, pedal edema, chest pain, cough or weight gain.   Says that her weight has been stable since she was here. Doesn't have much of an appetite but nibbles during the day and drinks 1 ensure daily. Daughter says that is how she's always eaten.   Past Medical History:  Diagnosis Date  . Allergy   . Anemia   . Arrhythmia   . CHF (congestive heart failure) ([place])   . CVA (cerebral vascular accident) ([place])   . Depression   . GERD (gastroesophageal reflux disease)   . History of blood transfusion   . History of kidney stones   . Hx: UTI (urinary tract infection)   . Hypercholesterolemia   . Hypertension    Past Surgical History:  Procedure Laterality Date  . CHOLECYSTECTOMY  1984  . NECK LESION BIOPSY  2015   Followed by Dr. Richardson Landry   Family History  Problem Relation Age of Onset  . Cancer Mother        unknown type  . Hypertension Mother   . Cancer Father        unknown type  . Cancer Daughter   . Colon cancer Other        grandfather   Social History   Tobacco Use  . Smoking status: Former Research scientist (life sciences)  . Smokeless tobacco: Former Systems developer    Types: Chew  Substance Use  Topics  . Alcohol use: No    Alcohol/week: 0.0 standard drinks   Allergies  Allergen Reactions  . Dyazide [Hydrochlorothiazide W-Triamterene]   . Monopril [Fosinopril]   . Toprol Xl [Metoprolol Tartrate]   . Verapamil    Prior to Admission medications   Medication Sig Start Date End Date Taking? Authorizing Provider  aspirin EC 81 MG EC tablet Take 1 tablet (81 mg total) by mouth daily. 05/20/19  Yes Lavina Hamman, MD  calcium carbonate (TUMS - DOSED IN MG ELEMENTAL CALCIUM) 500 MG chewable tablet Chew 1 tablet (200 mg of elemental calcium total) by mouth 3 (three) times daily as needed for indigestion or heartburn. 07/25/19  Yes Black, Lezlie Octave, NP  escitalopram (LEXAPRO) 10 MG tablet Take 0.5 tablets (5 mg total) by mouth daily. 06/19/19  Yes [name], MD  furosemide (LASIX) 40 MG tablet Take 0.5 tablets (20 mg total) by mouth daily. 09/13/19 09/12/20 Yes [name], MD  potassium chloride 20 MEQ/15ML (10%) SOLN Take 15 mLs (20 mEq total) by mouth daily. 12/04/19  Yes Sumedha Munnerlyn A, FNP  sacubitril-valsartan (ENTRESTO) 49-51 MG Take 1 tablet by mouth 2 (two) times daily. Patient taking differently: Take 0.5 tablets by mouth 2 (two) times daily.  11/29/19  Yes Crowley,  Aura Fey, FNP     Review of Systems  Constitutional: Positive for fatigue. Negative for appetite change.  HENT: Negative for congestion, postnasal drip and sore throat.   Eyes: Negative.   Respiratory: Positive for shortness of breath (with little exertion). Negative for cough.   Cardiovascular: Negative for chest pain, palpitations and leg swelling.  Gastrointestinal: Negative for abdominal distention and abdominal pain.  Endocrine: Negative.   Musculoskeletal: Negative for back pain and neck pain.  Skin: Negative.   Allergic/Immunologic: Negative.   Neurological: Positive for light-headedness. Negative for dizziness.  Hematological: Negative for adenopathy. Does not bruise/bleed easily.   Psychiatric/Behavioral: Negative for dysphoric mood and sleep disturbance (sleeping on 2 pillows). The patient is not nervous/anxious.    Vitals:   01/28/20 0829  BP: 115/65  Pulse: 70  Resp: 16  SpO2: 91%  Weight: 100 lb 2 oz (45.4 kg)  Height: 5\' 5"  (1.651 m)   Wt Readings from Last 3 Encounters:  01/28/20 100 lb 2 oz (45.4 kg)  01/10/20 100 lb (45.4 kg)  12/31/19 99 lb (44.9 kg)   Lab Results  Component Value Date   CREATININE 1.19 01/10/2020   CREATININE 1.57 (H) 12/31/2019   CREATININE 1.06 (H) 11/29/2019    Physical Exam Vitals and nursing note reviewed.  Constitutional:      Appearance: She is well-developed.  HENT:     Head: Normocephalic and atraumatic.  Neck:     Vascular: No JVD.  Cardiovascular:     Rate and Rhythm: Normal rate and regular rhythm.  Pulmonary:     Effort: Pulmonary effort is normal. No respiratory distress.     Breath sounds: No wheezing or rales.  Musculoskeletal:     Cervical back: Normal range of motion and neck supple.     Right lower leg: No tenderness. No edema.     Left lower leg: No tenderness. No edema.  Skin:    General: Skin is warm and dry.  Neurological:     General: No focal deficit present.     Mental Status: She is alert and oriented to person, place, and time.  Psychiatric:        Mood and Affect: Mood normal.        Behavior: Behavior normal.    Assessment & Plan:  1: Chronic heart failure with preserved ejection fraction and structural changes of moderate LVH- - NYHA class III - euvolemic today - weighing daily; reminded to call for an overnight weight gain of >2 pounds or a weekly weight gain of >5 pounds - weight up 1 pound from last visit here 1 month ago - patient says that she does add salt to some foods and her daughter says that she doesn't cook food with salt; encouraged her to not add any salt to her food - wearing oxygen as needed at home - BNP 11/04/19 was 393.4 - not interested in covid or flu  vaccines  2: HTN- - BP looks good today - saw PCP Nicki Reaper) 01/10/20 - BMP from 01/10/20 reviewed and showed sodium 139, potassium 5.4, creatinine 1.19 and GFR 50.57 (repeat potassium on 01/21/20 was 4.0)  3: Weight loss- - has stabilized - continue drinking ensure daily   Patient did not bring her medications nor a list. Each medication was verbally reviewed with the patient and she was encouraged to bring the bottles to every visit to confirm accuracy of list.  Return in 6 months or sooner for any questions/problems before then.

## 2020-01-28 ENCOUNTER — Other Ambulatory Visit: Payer: Self-pay

## 2020-01-28 ENCOUNTER — Encounter: Payer: Self-pay | Admitting: Family

## 2020-01-28 ENCOUNTER — Ambulatory Visit: Payer: Medicare Other | Attending: Family | Admitting: Family

## 2020-01-28 VITALS — BP 115/65 | HR 70 | Resp 16 | Ht 65.0 in | Wt 100.1 lb

## 2020-01-28 DIAGNOSIS — R42 Dizziness and giddiness: Secondary | ICD-10-CM | POA: Diagnosis not present

## 2020-01-28 DIAGNOSIS — I10 Essential (primary) hypertension: Secondary | ICD-10-CM

## 2020-01-28 DIAGNOSIS — E7800 Pure hypercholesterolemia, unspecified: Secondary | ICD-10-CM | POA: Insufficient documentation

## 2020-01-28 DIAGNOSIS — I509 Heart failure, unspecified: Secondary | ICD-10-CM | POA: Insufficient documentation

## 2020-01-28 DIAGNOSIS — I110 Hypertensive heart disease with heart failure: Secondary | ICD-10-CM | POA: Diagnosis not present

## 2020-01-28 DIAGNOSIS — R634 Abnormal weight loss: Secondary | ICD-10-CM

## 2020-01-28 DIAGNOSIS — Z7982 Long term (current) use of aspirin: Secondary | ICD-10-CM | POA: Diagnosis not present

## 2020-01-28 DIAGNOSIS — Z8673 Personal history of transient ischemic attack (TIA), and cerebral infarction without residual deficits: Secondary | ICD-10-CM | POA: Diagnosis not present

## 2020-01-28 DIAGNOSIS — Z8249 Family history of ischemic heart disease and other diseases of the circulatory system: Secondary | ICD-10-CM | POA: Insufficient documentation

## 2020-01-28 DIAGNOSIS — R5383 Other fatigue: Secondary | ICD-10-CM | POA: Diagnosis not present

## 2020-01-28 DIAGNOSIS — K219 Gastro-esophageal reflux disease without esophagitis: Secondary | ICD-10-CM | POA: Insufficient documentation

## 2020-01-28 DIAGNOSIS — I5032 Chronic diastolic (congestive) heart failure: Secondary | ICD-10-CM

## 2020-01-28 DIAGNOSIS — Z87891 Personal history of nicotine dependence: Secondary | ICD-10-CM | POA: Diagnosis not present

## 2020-01-28 DIAGNOSIS — E785 Hyperlipidemia, unspecified: Secondary | ICD-10-CM | POA: Insufficient documentation

## 2020-01-28 DIAGNOSIS — F329 Major depressive disorder, single episode, unspecified: Secondary | ICD-10-CM | POA: Insufficient documentation

## 2020-01-28 DIAGNOSIS — Z79899 Other long term (current) drug therapy: Secondary | ICD-10-CM | POA: Insufficient documentation

## 2020-01-28 DIAGNOSIS — R0602 Shortness of breath: Secondary | ICD-10-CM | POA: Diagnosis not present

## 2020-01-28 NOTE — Patient Instructions (Signed)
Continue weighing daily and call for an overnight weight gain of > 2 pounds or a weekly weight gain of >5 pounds. 

## 2020-02-04 DIAGNOSIS — I10 Essential (primary) hypertension: Secondary | ICD-10-CM | POA: Diagnosis not present

## 2020-02-05 ENCOUNTER — Other Ambulatory Visit (INDEPENDENT_AMBULATORY_CARE_PROVIDER_SITE_OTHER): Payer: Medicare Other

## 2020-02-05 ENCOUNTER — Other Ambulatory Visit: Payer: Self-pay

## 2020-02-05 DIAGNOSIS — E875 Hyperkalemia: Secondary | ICD-10-CM | POA: Diagnosis not present

## 2020-02-05 LAB — BASIC METABOLIC PANEL
BUN: 38 mg/dL — ABNORMAL HIGH (ref 6–23)
CO2: 35 mEq/L — ABNORMAL HIGH (ref 19–32)
Calcium: 9.4 mg/dL (ref 8.4–10.5)
Chloride: 102 mEq/L (ref 96–112)
Creatinine, Ser: 1.4 mg/dL — ABNORMAL HIGH (ref 0.40–1.20)
GFR: 30.95 mL/min — ABNORMAL LOW (ref >60.00)
Glucose, Bld: 88 mg/dL (ref 70–99)
Potassium: 3.5 mEq/L (ref 3.5–5.1)
Sodium: 144 mEq/L (ref 135–145)

## 2020-02-08 ENCOUNTER — Other Ambulatory Visit: Payer: Self-pay | Admitting: Internal Medicine

## 2020-02-08 ENCOUNTER — Telehealth: Payer: Self-pay

## 2020-02-08 DIAGNOSIS — R944 Abnormal results of kidney function studies: Secondary | ICD-10-CM

## 2020-02-08 NOTE — Progress Notes (Signed)
Order placed for f/u met b.  

## 2020-02-08 NOTE — Telephone Encounter (Signed)
Lab ordered for repeat lab appt.

## 2020-02-15 DIAGNOSIS — R0602 Shortness of breath: Secondary | ICD-10-CM | POA: Diagnosis not present

## 2020-02-22 ENCOUNTER — Other Ambulatory Visit: Payer: Medicare Other

## 2020-03-02 ENCOUNTER — Other Ambulatory Visit: Payer: Self-pay | Admitting: Internal Medicine

## 2020-03-03 DIAGNOSIS — I5031 Acute diastolic (congestive) heart failure: Secondary | ICD-10-CM | POA: Diagnosis not present

## 2020-03-03 DIAGNOSIS — J9601 Acute respiratory failure with hypoxia: Secondary | ICD-10-CM | POA: Diagnosis not present

## 2020-03-03 DIAGNOSIS — J9611 Chronic respiratory failure with hypoxia: Secondary | ICD-10-CM | POA: Diagnosis not present

## 2020-03-03 DIAGNOSIS — Z9181 History of falling: Secondary | ICD-10-CM | POA: Diagnosis not present

## 2020-03-03 DIAGNOSIS — Z8673 Personal history of transient ischemic attack (TIA), and cerebral infarction without residual deficits: Secondary | ICD-10-CM | POA: Diagnosis not present

## 2020-03-06 DIAGNOSIS — I10 Essential (primary) hypertension: Secondary | ICD-10-CM | POA: Diagnosis not present

## 2020-03-17 DIAGNOSIS — R0602 Shortness of breath: Secondary | ICD-10-CM | POA: Diagnosis not present

## 2020-03-19 ENCOUNTER — Emergency Department: Payer: Medicare Other

## 2020-03-19 ENCOUNTER — Other Ambulatory Visit: Payer: Self-pay

## 2020-03-19 ENCOUNTER — Inpatient Hospital Stay (HOSPITAL_BASED_OUTPATIENT_CLINIC_OR_DEPARTMENT_OTHER)
Admit: 2020-03-19 | Discharge: 2020-03-19 | Disposition: A | Discharge status: Home / Self Care | Payer: Medicare Other | Attending: Internal Medicine | Admitting: Internal Medicine

## 2020-03-19 ENCOUNTER — Other Ambulatory Visit: Payer: Medicare Other

## 2020-03-19 ENCOUNTER — Observation Stay
Admission: EM | Admit: 2020-03-19 | Discharge: 2020-03-20 | Disposition: A | Discharge status: Home / Self Care | Payer: Medicare Other | Attending: Internal Medicine | Admitting: Internal Medicine

## 2020-03-19 ENCOUNTER — Telehealth: Payer: Self-pay | Admitting: Internal Medicine

## 2020-03-19 DIAGNOSIS — I342 Nonrheumatic mitral (valve) stenosis: Secondary | ICD-10-CM

## 2020-03-19 DIAGNOSIS — Z8249 Family history of ischemic heart disease and other diseases of the circulatory system: Secondary | ICD-10-CM | POA: Insufficient documentation

## 2020-03-19 DIAGNOSIS — R778 Other specified abnormalities of plasma proteins: Principal | ICD-10-CM | POA: Insufficient documentation

## 2020-03-19 DIAGNOSIS — R7989 Other specified abnormal findings of blood chemistry: Secondary | ICD-10-CM | POA: Diagnosis not present

## 2020-03-19 DIAGNOSIS — F32A Depression, unspecified: Secondary | ICD-10-CM | POA: Diagnosis present

## 2020-03-19 DIAGNOSIS — W19XXXA Unspecified fall, initial encounter: Secondary | ICD-10-CM

## 2020-03-19 DIAGNOSIS — Y92009 Unspecified place in unspecified non-institutional (private) residence as the place of occurrence of the external cause: Secondary | ICD-10-CM

## 2020-03-19 DIAGNOSIS — I6782 Cerebral ischemia: Secondary | ICD-10-CM | POA: Insufficient documentation

## 2020-03-19 DIAGNOSIS — N390 Urinary tract infection, site not specified: Secondary | ICD-10-CM | POA: Diagnosis not present

## 2020-03-19 DIAGNOSIS — I050 Rheumatic mitral stenosis: Secondary | ICD-10-CM | POA: Diagnosis not present

## 2020-03-19 DIAGNOSIS — I10 Essential (primary) hypertension: Secondary | ICD-10-CM

## 2020-03-19 DIAGNOSIS — I110 Hypertensive heart disease with heart failure: Secondary | ICD-10-CM | POA: Diagnosis not present

## 2020-03-19 DIAGNOSIS — I5032 Chronic diastolic (congestive) heart failure: Secondary | ICD-10-CM | POA: Diagnosis present

## 2020-03-19 DIAGNOSIS — G319 Degenerative disease of nervous system, unspecified: Secondary | ICD-10-CM | POA: Diagnosis not present

## 2020-03-19 DIAGNOSIS — R519 Headache, unspecified: Secondary | ICD-10-CM | POA: Diagnosis not present

## 2020-03-19 DIAGNOSIS — Z79899 Other long term (current) drug therapy: Secondary | ICD-10-CM | POA: Insufficient documentation

## 2020-03-19 DIAGNOSIS — I214 Non-ST elevation (NSTEMI) myocardial infarction: Secondary | ICD-10-CM | POA: Diagnosis present

## 2020-03-19 DIAGNOSIS — G9341 Metabolic encephalopathy: Secondary | ICD-10-CM | POA: Diagnosis not present

## 2020-03-19 DIAGNOSIS — R4182 Altered mental status, unspecified: Secondary | ICD-10-CM | POA: Insufficient documentation

## 2020-03-19 DIAGNOSIS — R221 Localized swelling, mass and lump, neck: Secondary | ICD-10-CM | POA: Insufficient documentation

## 2020-03-19 DIAGNOSIS — J449 Chronic obstructive pulmonary disease, unspecified: Secondary | ICD-10-CM | POA: Diagnosis not present

## 2020-03-19 DIAGNOSIS — Z8673 Personal history of transient ischemic attack (TIA), and cerebral infarction without residual deficits: Secondary | ICD-10-CM | POA: Diagnosis not present

## 2020-03-19 DIAGNOSIS — Z888 Allergy status to other drugs, medicaments and biological substances status: Secondary | ICD-10-CM | POA: Diagnosis not present

## 2020-03-19 DIAGNOSIS — M47812 Spondylosis without myelopathy or radiculopathy, cervical region: Secondary | ICD-10-CM | POA: Diagnosis not present

## 2020-03-19 DIAGNOSIS — Z87891 Personal history of nicotine dependence: Secondary | ICD-10-CM | POA: Diagnosis not present

## 2020-03-19 DIAGNOSIS — N3000 Acute cystitis without hematuria: Secondary | ICD-10-CM

## 2020-03-19 DIAGNOSIS — Z20822 Contact with and (suspected) exposure to covid-19: Secondary | ICD-10-CM | POA: Diagnosis not present

## 2020-03-19 DIAGNOSIS — Z7982 Long term (current) use of aspirin: Secondary | ICD-10-CM | POA: Insufficient documentation

## 2020-03-19 DIAGNOSIS — F320 Major depressive disorder, single episode, mild: Secondary | ICD-10-CM

## 2020-03-19 DIAGNOSIS — I361 Nonrheumatic tricuspid (valve) insufficiency: Secondary | ICD-10-CM | POA: Diagnosis not present

## 2020-03-19 LAB — CBC WITH DIFFERENTIAL/PLATELET
Abs Immature Granulocytes: 0.01 10*3/uL (ref 0.00–0.07)
Basophils Absolute: 0 10*3/uL (ref 0.0–0.1)
Basophils Relative: 0 %
Eosinophils Absolute: 0.1 10*3/uL (ref 0.0–0.5)
Eosinophils Relative: 2 %
HCT: 38.4 % (ref 36.0–46.0)
Hemoglobin: 12.3 g/dL (ref 12.0–15.0)
Immature Granulocytes: 0 %
Lymphocytes Relative: 17 %
Lymphs Abs: 0.9 10*3/uL (ref 0.7–4.0)
MCH: 29.3 pg (ref 26.0–34.0)
MCHC: 32 g/dL (ref 30.0–36.0)
MCV: 91.4 fL (ref 80.0–100.0)
Monocytes Absolute: 0.5 10*3/uL (ref 0.1–1.0)
Monocytes Relative: 9 %
Neutro Abs: 3.7 10*3/uL (ref 1.7–7.7)
Neutrophils Relative %: 72 %
Platelets: 221 10*3/uL (ref 150–400)
RBC: 4.2 MIL/uL (ref 3.87–5.11)
RDW: 14.8 % (ref 11.5–15.5)
WBC: 5.2 10*3/uL (ref 4.0–10.5)
nRBC: 0 % (ref 0.0–0.2)

## 2020-03-19 LAB — URINALYSIS, COMPLETE (UACMP) WITH MICROSCOPIC
Bilirubin Urine: NEGATIVE
Glucose, UA: NEGATIVE mg/dL
Ketones, ur: NEGATIVE mg/dL
Nitrite: NEGATIVE
Protein, ur: NEGATIVE mg/dL
Specific Gravity, Urine: 1.008 (ref 1.005–1.030)
pH: 6 (ref 5.0–8.0)

## 2020-03-19 LAB — BASIC METABOLIC PANEL
Anion gap: 11 (ref 5–15)
BUN: 27 mg/dL — ABNORMAL HIGH (ref 8–23)
CO2: 32 mmol/L (ref 22–32)
Calcium: 9.8 mg/dL (ref 8.9–10.3)
Chloride: 94 mmol/L — ABNORMAL LOW (ref 98–111)
Creatinine, Ser: 0.98 mg/dL (ref 0.44–1.00)
GFR, Estimated: 52 mL/min — ABNORMAL LOW (ref >60)
Glucose, Bld: 89 mg/dL (ref 70–99)
Potassium: 4.2 mmol/L (ref 3.5–5.1)
Sodium: 137 mmol/L (ref 135–145)

## 2020-03-19 LAB — TROPONIN I (HIGH SENSITIVITY)
Troponin I (High Sensitivity): 452 ng/L (ref <18)
Troponin I (High Sensitivity): 499 ng/L (ref <18)

## 2020-03-19 LAB — LACTIC ACID, PLASMA: Lactic Acid, Venous: 1.4 mmol/L (ref 0.5–1.9)

## 2020-03-19 LAB — PROTIME-INR
INR: 1 (ref 0.8–1.2)
Prothrombin Time: 12.6 seconds (ref 11.4–15.2)

## 2020-03-19 LAB — RESPIRATORY PANEL BY RT PCR (FLU A&B, COVID)
Influenza A by PCR: NEGATIVE
Influenza B by PCR: NEGATIVE
SARS Coronavirus 2 by RT PCR: NEGATIVE

## 2020-03-19 LAB — BRAIN NATRIURETIC PEPTIDE: B Natriuretic Peptide: 324.4 pg/mL — ABNORMAL HIGH (ref 0.0–100.0)

## 2020-03-19 LAB — APTT: aPTT: 29 seconds (ref 24–36)

## 2020-03-19 MED ORDER — CALCIUM CARBONATE ANTACID 500 MG PO CHEW: 1.0000 | CHEWABLE_TABLET | Freq: Three times a day (TID) | ORAL | Status: DC | PRN

## 2020-03-19 MED ORDER — FUROSEMIDE 20 MG PO TABS
20.0000 mg | ORAL_TABLET | Freq: Every day | ORAL | Status: DC
  Administered 2020-03-20: 20 mg via ORAL
  Filled 2020-03-19: qty 1

## 2020-03-19 MED ORDER — ASPIRIN EC 81 MG PO TBEC
81.0000 mg | DELAYED_RELEASE_TABLET | Freq: Every day | ORAL | Status: DC
  Administered 2020-03-20: 81 mg via ORAL
  Filled 2020-03-19: qty 1

## 2020-03-19 MED ORDER — SACUBITRIL-VALSARTAN 49-51 MG PO TABS
0.5000 | ORAL_TABLET | Freq: Two times a day (BID) | ORAL | Status: DC
  Administered 2020-03-19 – 2020-03-20 (×2): 0.5 via ORAL
  Filled 2020-03-19 (×2): qty 1

## 2020-03-19 MED ORDER — SENNA 8.6 MG PO TABS: 1.0000 | ORAL_TABLET | Freq: Every day | ORAL | Status: DC | PRN

## 2020-03-19 MED ORDER — ONDANSETRON HCL 4 MG/2ML IJ SOLN: 4.0000 mg | Freq: Three times a day (TID) | INTRAMUSCULAR | Status: DC | PRN

## 2020-03-19 MED ORDER — ESCITALOPRAM OXALATE 10 MG PO TABS
5.0000 mg | ORAL_TABLET | Freq: Every day | ORAL | Status: DC
  Administered 2020-03-20: 5 mg via ORAL
  Filled 2020-03-19: qty 0.5

## 2020-03-19 MED ORDER — ENOXAPARIN SODIUM 30 MG/0.3ML ~~LOC~~ SOLN
30.0000 mg | SUBCUTANEOUS | Status: DC
  Administered 2020-03-19: 30 mg via SUBCUTANEOUS
  Filled 2020-03-19: qty 0.3

## 2020-03-19 MED ORDER — ATORVASTATIN CALCIUM 20 MG PO TABS
40.0000 mg | ORAL_TABLET | Freq: Every day | ORAL | Status: DC
  Administered 2020-03-19: 40 mg via ORAL
  Filled 2020-03-19: qty 2

## 2020-03-19 MED ORDER — SODIUM CHLORIDE 0.9 % IV SOLN
1.0000 g | INTRAVENOUS | Status: DC
  Administered 2020-03-20: 1 g via INTRAVENOUS
  Filled 2020-03-19: qty 1

## 2020-03-19 MED ORDER — SODIUM CHLORIDE 0.9 % IV SOLN
1.0000 g | Freq: Once | INTRAVENOUS | Status: AC
  Administered 2020-03-19: 1 g via INTRAVENOUS
  Filled 2020-03-19: qty 10

## 2020-03-19 MED ORDER — HYDRALAZINE HCL 20 MG/ML IJ SOLN: 5.0000 mg | INTRAMUSCULAR | Status: DC | PRN

## 2020-03-19 MED ORDER — ACETAMINOPHEN 325 MG PO TABS: 650.0000 mg | ORAL_TABLET | Freq: Four times a day (QID) | ORAL | Status: DC | PRN

## 2020-03-19 NOTE — Progress Notes (Signed)
*  PRELIMINARY RESULTS* Echocardiogram 2D Echocardiogram has been performed.  Taylor Watson Nicotra 03/19/2020, 6:00 PM

## 2020-03-19 NOTE — Consult Note (Signed)
CARDIOLOGY CONSULT NOTE               Patient ID: BRITANIE HARSHMAN MRN: [id number] DOB/AGE: [birth year] 86 y.o.  Admit date: 03/19/2020 Referring Physician Blaine Hamper Primary Physician [name], MD  Primary Cardiologist Nehemiah Massed Reason for Consultation elevated troponin  HPI: 86 year old female referred for evaluation of elevated troponin. The patient has a history of hypertension, hyperlipidemia, diastolic CHF, history of stroke, HOH, and GERD. At this time, the patient's mental status is altered and the daughter contributes to the history. The daughter reports that the patient has been confused for the last few days, pacing the floor, not sleeping. She lost her balance, fell backwards and landed on the carpet without head injury. She had some shortness of breath, but has not complained of chest pain or had peripheral edema or weight gain. In the ER, the patient's temperature was 100.3, SpO2 93-97% on room air. Admission labs notable for high sensitivity troponin 452->499->414, BNP 324. Head CT negative for acute intracranial abnormalities. Urinalysis showed bacteria, trace leukocytes. Chest xray showed COPD without acute cardiopulmonary abnormalities. ECG showed sinus arrhythmia with LVH and lateral T wave inversions. Repeat ECG this morning shows no ischemic changes. The patient was started on antibiotics for UTI.     Review of systems complete and found to be negative unless listed above     Past Medical History:  Diagnosis Date  . Allergy   . Anemia   . Arrhythmia   . CHF (congestive heart failure) ([place])   . CVA (cerebral vascular accident) ([place])   . Depression   . GERD (gastroesophageal reflux disease)   . History of blood transfusion   . History of kidney stones   . Hx: UTI (urinary tract infection)   . Hypercholesterolemia   . Hypertension     Past Surgical History:  Procedure Laterality Date  . CHOLECYSTECTOMY  1984  . NECK LESION BIOPSY  2015   Followed by Dr.  Richardson Landry    Medications Prior to Admission  Medication Sig Dispense Refill Last Dose  . aspirin EC 81 MG EC tablet Take 1 tablet (81 mg total) by mouth daily. 30 tablet 0 03/18/2020 at 1100  . calcium carbonate (TUMS - DOSED IN MG ELEMENTAL CALCIUM) 500 MG chewable tablet Chew 1 tablet (200 mg of elemental calcium total) by mouth 3 (three) times daily as needed for indigestion or heartburn.   Past Month at prn   . escitalopram (LEXAPRO) 10 MG tablet TAKE 1/2 TABLET BY MOUTH DAILY 45 tablet 1 03/18/2020 at 2100  . furosemide (LASIX) 40 MG tablet Take 0.5 tablets (20 mg total) by mouth daily. 90 tablet 1 03/19/2020 at 1000  . potassium chloride 20 MEQ/15ML (10%) SOLN Take 15 mLs (20 mEq total) by mouth daily. 473 mL 5 03/18/2020 at 1700  . sacubitril-valsartan (ENTRESTO) 49-51 MG Take 1 tablet by mouth 2 (two) times daily. (Patient taking differently: Take 0.5 tablets by mouth 2 (two) times daily. ) 60 tablet 5 03/19/2020 at 1000   Social History   Socioeconomic History  . Marital status: Widowed    Spouse name: Not on file  . Number of children: 5  . Years of education: Not on file  . Highest education level: Not on file  Occupational History  . Not on file  Tobacco Use  . Smoking status: Former Research scientist (life sciences)  . Smokeless tobacco: Former Systems developer    Types: Chew  Substance and Sexual Activity  . Alcohol use: No  Alcohol/week: 0.0 standard drinks  . Drug use: No  . Sexual activity: Not Currently  Other Topics Concern  . Not on file  Social History Narrative  . Not on file   Social Determinants of Health   Financial Resource Strain:   . Difficulty of Paying Living Expenses: Not on file  Food Insecurity:   . Worried About Charity fundraiser in the Last Year: Not on file  . Ran Out of Food in the Last Year: Not on file  Transportation Needs:   . Lack of Transportation (Medical): Not on file  . Lack of Transportation (Non-Medical): Not on file  Physical Activity:   . Days of Exercise  per Week: Not on file  . Minutes of Exercise per Session: Not on file  Stress:   . Feeling of Stress : Not on file  Social Connections:   . Frequency of Communication with Friends and Family: Not on file  . Frequency of Social Gatherings with Friends and Family: Not on file  . Attends Religious Services: Not on file  . Active Member of Clubs or Organizations: Not on file  . Attends Archivist Meetings: Not on file  . Marital Status: Not on file  Intimate Partner Violence:   . Fear of Current or Ex-Partner: Not on file  . Emotionally Abused: Not on file  . Physically Abused: Not on file  . Sexually Abused: Not on file    Family History  Problem Relation Age of Onset  . Cancer Mother        unknown type  . Hypertension Mother   . Cancer Father        unknown type  . Cancer Daughter   . Colon cancer Other        grandfather      Review of systems incomplete as patient's mental status is altered     PHYSICAL EXAM  General: Frail, elderly female lying in bed, attempting to get out of bed, in no acute distress HEENT:  Normocephalic and atramatic Neck:  No JVD.  Lungs: normal effort of breathing on room air. Heart: HRRR . Normal S1 and S2 without gallops or murmurs.  Abdomen: nondistended Msk:  Gait not assessed. No obvious deformities Extremities: No clubbing, cyanosis or edema.   Neuro: Alert, oriented to self, appears confused   Labs:   Lab Results  Component Value Date   WBC 5.2 03/19/2020   HGB 12.3 03/19/2020   HCT 38.4 03/19/2020   MCV 91.4 03/19/2020   PLT 221 03/19/2020    Recent Labs  Lab 03/19/20 1236  NA 137  K 4.2  CL 94*  CO2 32  BUN 27*  CREATININE 0.98  CALCIUM 9.8  GLUCOSE 89   Lab Results  Component Value Date   CKTOTAL 94 09/20/2011   CKMB 1.2 09/20/2011   TROPONINI <0.03 07/14/2016    Lab Results  Component Value Date   CHOL 236 (H) 05/19/2019   CHOL 288 (H) 05/11/2017   CHOL 254 (H) 05/28/2015   Lab Results    Component Value Date   HDL 52 05/19/2019   HDL 68.90 05/11/2017   HDL 58.40 05/28/2015   Lab Results  Component Value Date   LDLCALC 167 (H) 05/19/2019   LDLCALC 194 (H) 05/11/2017   LDLCALC 176 (H) 05/28/2015   Lab Results  Component Value Date   TRIG 83 05/19/2019   TRIG 124.0 05/11/2017   TRIG 100.0 05/28/2015   Lab Results  Component  Value Date   CHOLHDL 4.5 05/19/2019   CHOLHDL 4 05/11/2017   CHOLHDL 4 05/28/2015   Lab Results  Component Value Date   LDLDIRECT 163.5 01/12/2013   LDLDIRECT 126.7 10/18/2012      Radiology: DG Chest 2 View  Result Date: 03/19/2020 CLINICAL DATA:  Confusion, fall EXAM: CHEST - 2 VIEW COMPARISON:  11/04/2019, 05/17/2019 FINDINGS: Heart size appears mildly enlarged. Densely calcified, tortuous aorta with a similar degree of ectasias a within the ascending aorta. Hyperinflated lungs with coarsened interstitial markings bilaterally. No focal airspace consolidation, pleural effusion, or pneumothorax. Persistent prominence of the soft tissues in the right supraclavicular region, which has been previously evaluated and biopsied. Osseous structures appear demineralized. Advanced degenerative changes throughout the thoracic spine. No acute osseous abnormality is identified. IMPRESSION: 1. COPD without acute cardiopulmonary abnormality. 2. Densely calcified, tortuous aorta. Electronically Signed   By: Davina Poke D.O.   On: 03/19/2020 13:29   CT Head Wo Contrast  Result Date: 03/19/2020 CLINICAL DATA:  Head trauma, minor. Additional history provided: Fall, mild headache. EXAM: CT HEAD WITHOUT CONTRAST CT CERVICAL SPINE WITHOUT CONTRAST TECHNIQUE: Multidetector CT imaging of the head and cervical spine was performed following the standard protocol without intravenous contrast. Multiplanar CT image reconstructions of the cervical spine were also generated. COMPARISON:  Brain MRI 05/18/2019. Head CT 05/18/2019. Neck CT 12/14/2013. FINDINGS: CT HEAD  FINDINGS Brain: Mild generalized cerebral atrophy. Moderate/advanced ill-defined hypoattenuation within the cerebral white matter is nonspecific, but compatible with chronic small vessel ischemic disease. There is no acute intracranial hemorrhage. No demarcated cortical infarct. No extra-axial fluid collection. No evidence of intracranial mass. No midline shift. Vascular: No hyperdense vessel.  Atherosclerotic calcifications. Skull: No calvarial fracture. 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 Sinuses/Orbits: Visualized orbits show no acute finding. Mild ethmoid sinus mucosal thickening. Small left sphenoid sinus mucous retention cyst. CT CERVICAL SPINE FINDINGS Alignment: Trace C7-T1 grade 1 anterolisthesis. Skull base and vertebrae: The basion-dental and atlanto-dental intervals are maintained.No evidence of acute fracture to the cervical spine. Soft tissues and spinal canal: No prevertebral fluid or swelling. No visible canal hematoma. Disc levels: Cervical spondylosis. Most notably at C5-C6, there is severe disc degeneration with degenerative endplate irregularity and sclerosis, a posterior disc osteophyte complex and uncovertebral hypertrophy. Upper chest: No consolidation within the imaged lung apices. No visible pneumothorax. Other: Right supraclavicular mass measuring 4.1 x 3.2 x 3.9 cm. IMPRESSION: CT head: 1. No evidence of acute intracranial abnormality. 2. Mild cerebral atrophy with moderate/advanced chronic small vessel ischemic disease, stable as compared to the brain MRI of 05/18/2019. 3. 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 CT cervical spine: 1. No evidence of acute fracture to the cervical spine. 2. Mild C7-T1 grade 1 anterolisthesis. 3. Cervical spondylosis, greatest at C5-C6. 4. 4.1 cm  right supraclavicular mass increased in size since the prior neck CT of 12/14/2013 (measuring 3.3 cm in greatest dimension at that time). Review of prior electronic medical records shows that this mass was biopsied in 2015 and pathology was consistent with a schwannoma. Electronically Signed   By: Kellie Simmering DO   On: 03/19/2020 13:12   CT Cervical Spine Wo Contrast  Result Date: 03/19/2020 CLINICAL DATA:  Head trauma, minor. Additional history provided: Fall, mild headache. EXAM: CT HEAD WITHOUT CONTRAST CT CERVICAL SPINE WITHOUT CONTRAST TECHNIQUE: Multidetector CT imaging of the head and cervical spine  was performed following the standard protocol without intravenous contrast. Multiplanar CT image reconstructions of the cervical spine were also generated. COMPARISON:  Brain MRI 05/18/2019. Head CT 05/18/2019. Neck CT 12/14/2013. FINDINGS: CT HEAD FINDINGS Brain: Mild generalized cerebral atrophy. Moderate/advanced ill-defined hypoattenuation within the cerebral white matter is nonspecific, but compatible with chronic small vessel ischemic disease. There is no acute intracranial hemorrhage. No demarcated cortical infarct. No extra-axial fluid collection. No evidence of intracranial mass. No midline shift. Vascular: No hyperdense vessel.  Atherosclerotic calcifications. Skull: No calvarial fracture. 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 Sinuses/Orbits: Visualized orbits show no acute finding. Mild ethmoid sinus mucosal thickening. Small left sphenoid sinus mucous retention cyst. CT CERVICAL SPINE FINDINGS Alignment: Trace C7-T1 grade 1 anterolisthesis. Skull base and vertebrae: The basion-dental and atlanto-dental intervals are maintained.No evidence of acute fracture to the cervical spine. Soft tissues and spinal canal: No prevertebral fluid or swelling. No visible canal hematoma. Disc levels: Cervical  spondylosis. Most notably at C5-C6, there is severe disc degeneration with degenerative endplate irregularity and sclerosis, a posterior disc osteophyte complex and uncovertebral hypertrophy. Upper chest: No consolidation within the imaged lung apices. No visible pneumothorax. Other: Right supraclavicular mass measuring 4.1 x 3.2 x 3.9 cm. IMPRESSION: CT head: 1. No evidence of acute intracranial abnormality. 2. Mild cerebral atrophy with moderate/advanced chronic small vessel ischemic disease, stable as compared to the brain MRI of 05/18/2019. 3. 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 CT cervical spine: 1. No evidence of acute fracture to the cervical spine. 2. Mild C7-T1 grade 1 anterolisthesis. 3. Cervical spondylosis, greatest at C5-C6. 4. 4.1 cm right supraclavicular mass increased in size since the prior neck CT of 12/14/2013 (measuring 3.3 cm in greatest dimension at that time). Review of prior electronic medical records shows that this mass was biopsied in 2015 and pathology was consistent with a schwannoma. Electronically Signed   By: Kellie Simmering DO   On: 03/19/2020 13:12    EKG: sinus arrhythmia  ASSESSMENT AND PLAN:   1. Elevated troponin, down-trending, (452->499->414) in the absence of chest pain, with ECG revealing sinus arrhythmia with LVH and lateral T wave abnormalities, with a 86 year old female who presented with a few day history of AMS and UTI. 2. Altered mental status, likely due to UTI. Head CT negative for acute intracranial abnormality. 3. UTI, on antibiotics 4. Chronic diastolic CHF, no recent weight gain, leg edema, with possible recent shortness of breath, not currently in respiratory distress, with negative chest xray. BNP mildly elevated.  5. Hypertension, adequately controlled  Recommendations: 1. Defer heparin drip at this time; recommend conservative approach of  elevated troponin in light of patient's advanced age, frailty, AMS, and without significant ECG changes. 2. Treatment of UTI per hospitalist 3. Continue aspirin, Entresto and oral Lasix 4. Review 2D echocardiogram    Signed: Clabe Seal PA-C 03/19/2020, 4:52 PM

## 2020-03-19 NOTE — ED Triage Notes (Addendum)
Reports she lost her balance and fell onto hard floor this AM. "jarred my head". Pt denies LOC. Reports mild headache initially, denies now. No other injuries reported or visualized. Hard of hearing but alert to self, situation, place. Disoriented to time. Daughter arrived with pt, she witnessed fall; said she fell backwards onto chair on carpeted area. Did not hit head. Pt does not use a cane or walker and does not fall frequently.

## 2020-03-19 NOTE — Telephone Encounter (Signed)
Patients daughter stated the patient was given a choice between hospital or dr scott patient chose dr Nicki Reaper. But due to her sx or headaches and being unaware of where she is going and what she is doing I instructed to go to ED due to no appointments available. She is not having SOB, facial drooping, abnormal slurring of speech, chest px, arm px, jaw px, fever, chills, abnormal urinary sx, and abnormal BM. Patients daughter stated she will take patient to ED. Nicanor Alcon.

## 2020-03-19 NOTE — ED Provider Notes (Signed)
[facility] Emergency Department Provider Note   ____________________________________________    I have reviewed the triage vital signs and the nursing notes.   HISTORY  Chief Complaint Fall  Patient is hard of hearing and somewhat confused making history difficult, history obtained per daughter   HPI Taylor Watson is a 86 y.o. female with history as listed below who presents after a fall.  Daughter reports that she thinks she got dizzy and fell.  Daughter also notes that "she has been talking out of her head "the last 3 to 4 days.  Patient has not been vaccinated gets COVID-19.  Patient denies chest pain, no abdominal pain, no extremity injuries.  No back pain.  Unclear whether she hit her head.  She is not on blood thinners.  No fevers reported.  No vomiting.  Past Medical History:  Diagnosis Date  . Allergy   . Anemia   . Arrhythmia   . CHF (congestive heart failure) ([place])   . CVA (cerebral vascular accident) ([employer])   . Depression   . GERD (gastroesophageal reflux disease)   . History of blood transfusion   . History of kidney stones   . Hx: UTI (urinary tract infection)   . Hypercholesterolemia   . Hypertension     Patient Active Problem List   Diagnosis Date Noted  . UTI (urinary tract infection) 03/19/2020  . B12 deficiency 10/13/2019  . Skin lesion 08/19/2019  . Acute exacerbation of CHF (congestive heart failure) ([place]) 07/22/2019  . Acute respiratory failure with hypoxia ([place]) 07/22/2019  . Swelling of lower extremity 07/22/2019  . Atrial fibrillation with rapid ventricular response ([place]) 07/22/2019  . Fall at home, initial encounter 07/22/2019  . Volume overload 07/19/2019  . Hypertensive urgency 05/18/2019  . Acute metabolic encephalopathy 05/05/7251  . Elevated troponin 05/18/2019  . Hypokalemia 05/18/2019  . URI (upper respiratory infection) 08/26/2018  . Headache 07/17/2017  . SOB (shortness of breath) 03/28/2016  .  Near syncope 03/01/2016  . Anemia 10/28/2015  . Lower GI bleed   . Goals of care, counseling/discussion   . Blood loss anemia 06/01/2015  . GERD (gastroesophageal reflux disease)   . Mild depression ([place])   . Unsteady gait 02/08/2015  . Schwannoma 10/07/2013  . Rectal bleeding 04/03/2013  . Light headedness 01/19/2013  . Loss of weight 10/22/2012  . Hypercholesterolemia 10/22/2012  . History of CVA (cerebrovascular accident) 10/22/2012  . Anxiety 10/22/2012  . Essential hypertension, benign 10/22/2012    Past Surgical History:  Procedure Laterality Date  . CHOLECYSTECTOMY  1984  . NECK LESION BIOPSY  2015   Followed by Dr. Richardson Landry    Prior to Admission medications   Medication Sig Start Date End Date Taking? Authorizing Provider  aspirin EC 81 MG EC tablet Take 1 tablet (81 mg total) by mouth daily. 05/20/19   Lavina Hamman, MD  calcium carbonate (TUMS - DOSED IN MG ELEMENTAL CALCIUM) 500 MG chewable tablet Chew 1 tablet (200 mg of elemental calcium total) by mouth 3 (three) times daily as needed for indigestion or heartburn. 07/25/19   Black, Lezlie Octave, NP  escitalopram (LEXAPRO) 10 MG tablet TAKE 1/2 TABLET BY MOUTH DAILY 03/03/20   [name], MD  furosemide (LASIX) 40 MG tablet Take 0.5 tablets (20 mg total) by mouth daily. 09/13/19 09/12/20  [name], MD  potassium chloride 20 MEQ/15ML (10%) SOLN Take 15 mLs (20 mEq total) by mouth daily. 12/04/19   Alisa Graff, FNP  sacubitril-valsartan (ENTRESTO) 49-51 MG Take 1 tablet by mouth 2 (two) times daily. Patient taking differently: Take 0.5 tablets by mouth 2 (two) times daily.  11/29/19   Alisa Graff, FNP     Allergies Dyazide [hydrochlorothiazide w-triamterene], Monopril [fosinopril], Toprol xl [metoprolol tartrate], and Verapamil  Family History  Problem Relation Age of Onset  . Cancer Mother        unknown type  . Hypertension Mother   . Cancer Father        unknown type  . Cancer Daughter   . Colon  cancer Other        grandfather    Social History Social History   Tobacco Use  . Smoking status: Former Research scientist (life sciences)  . Smokeless tobacco: Former Systems developer    Types: Chew  Substance Use Topics  . Alcohol use: No    Alcohol/week: 0.0 standard drinks  . Drug use: No    Review of Systems  Constitutional: As above Eyes: No visual changes.  ENT: No sore throat. Cardiovascular: Denies chest pain. Respiratory: Denies shortness of breath. Gastrointestinal: No abdominal pain.  No nausea, no vomiting.   Genitourinary: Negative for dysuria. Musculoskeletal: Moving all extremities well. Skin: Negative for rash. Neurological:  denies neuro deficits   ____________________________________________   PHYSICAL EXAM:  VITAL SIGNS: ED Triage Vitals  Enc Vitals Group     BP 03/19/20 1209 133/68     Pulse Rate 03/19/20 1209 97     Resp 03/19/20 1209 18     Temp 03/19/20 1209 100.3 F (37.9 C)     Temp Source 03/19/20 1209 Oral     SpO2 03/19/20 1209 93 %     Weight 03/19/20 1213 51.3 kg (113 lb)     Height 03/19/20 1213 1.6 m (5\' 3" )     Head Circumference --      Peak Flow --      Pain Score 03/19/20 1213 0     Pain Loc --      Pain Edu? --      Excl. in [place]? --     Constitutional: Alert and oriented.  Eyes: Conjunctivae are normal.  Head: Atraumatic. Nose: No congestion/rhinnorhea. Mouth/Throat: Mucous membranes are moist.   Neck:  Painless ROM Cardiovascular: Normal rate, regular rhythm.  Good peripheral circulation. Respiratory: Normal respiratory effort.  No retractions. Lungs CTAB. Gastrointestinal: Soft and nontender. No distention.  No CVA tenderness.  Musculoskeletal: No lower extremity tenderness nor edema.  Warm and well perfused.  No pain with axial load on both hips, no vertebral tenderness to palpation, normal movement of all extremities Neurologic:  Normal speech and language. No gross focal neurologic deficits are appreciated.  Skin:  Skin is warm, dry and intact.  No rash noted. Psychiatric: Mood and affect are normal. Speech and behavior are normal.  ____________________________________________   LABS (all labs ordered are listed, but only abnormal results are displayed)  Labs Reviewed  URINALYSIS, COMPLETE (UACMP) WITH MICROSCOPIC - Abnormal; Notable for the following components:      Result Value   Color, Urine STRAW (*)    APPearance CLEAR (*)    Hgb urine dipstick SMALL (*)    Leukocytes,Ua TRACE (*)    Bacteria, UA MANY (*)    All other components within normal limits  BASIC METABOLIC PANEL - Abnormal; Notable for the following components:   Chloride 94 (*)    BUN 27 (*)    GFR, Estimated 52 (*)    All other components within  normal limits  TROPONIN I (HIGH SENSITIVITY) - Abnormal; Notable for the following components:   Troponin I (High Sensitivity) 452 (*)    All other components within normal limits  RESPIRATORY PANEL BY RT PCR (FLU A&B, COVID)  CULTURE, BLOOD (SINGLE)  URINE CULTURE  CBC WITH DIFFERENTIAL/PLATELET  APTT  PROTIME-INR  LACTIC ACID, PLASMA  LACTIC ACID, PLASMA  BRAIN NATRIURETIC PEPTIDE  TROPONIN I (HIGH SENSITIVITY)   ____________________________________________  EKG  ED ECG REPORT I, Lavonia Drafts, the attending physician, personally viewed and interpreted this ECG.  Date: 03/19/2020  Rhythm: normal sinus rhythm QRS Axis: normal Intervals: Incomplete right bundle branch block ST/T Wave abnormalities: normal Narrative Interpretation: no evidence of acute ischemia  ED ECG REPORT I, Lavonia Drafts, the attending physician, personally viewed and interpreted this ECG.  Date: 03/19/2020  Rhythm: normal sinus rhythm QRS Axis: normal Intervals: normal ST/T Wave abnormalities: normal Narrative Interpretation: No significant change from first EKG   ____________________________________________  RADIOLOGY  CT head and cervical spine reviewed by me, no acute injuries noted Chest x-ray reviewed by  me ____________________________________________   PROCEDURES  Procedure(s) performed: yes  Procedures   Critical Care performed: yes  CRITICAL CARE Performed by: Lavonia Drafts   Total critical care time: 30 minutes  Critical care time was exclusive of separately billable procedures and treating other patients.  Critical care was necessary to treat or prevent imminent or life-threatening deterioration.  Critical care was time spent personally by me on the following activities: development of treatment plan with patient and/or surrogate as well as nursing, discussions with consultants, evaluation of patient's response to treatment, examination of patient, obtaining history from patient or surrogate, ordering and performing treatments and interventions, ordering and review of laboratory studies, ordering and review of radiographic studies, pulse oximetry and re-evaluation of patient's condition.  ____________________________________________   INITIAL IMPRESSION / ASSESSMENT AND PLAN / ED COURSE  Pertinent labs & imaging results that were available during my care of the patient were reviewed by me and considered in my medical decision making (see chart for details).  Patient presents after fall, reportedly became dizzy.  Notably patient has been confused per daughter.  Overall well-appearing in the emergency department however temperature is 100.3 and mildly tachycardic.  Suspicious for possible metabolic encephalopathy related to infection.  Patient is not been vaccinated against [id number] so certainly Covid is a possibility.  We will send COVID-19 PCR, check labs, chest x-ray, urinalysis and reevaluate.  White blood cell count is normal, mild elevation of BUN to creatinine ratio noted on BMP.  Notified of elevated troponin of 452.  Patient does not have any active chest pain, EKG repeated, no change from initial.  Suspect elevated troponin related to infection, likely UTI  causing metabolic encephalopathy leading to elevated troponin due to strain, have discussed with hospitalist for admission.    ____________________________________________   FINAL CLINICAL IMPRESSION(S) / ED DIAGNOSES  Final diagnoses:  Fall, initial encounter  Acute metabolic encephalopathy  Elevated troponin  Lower urinary tract infectious disease        Note:  This document was prepared using Dragon voice recognition software and may include unintentional dictation errors.   Lavonia Drafts, MD 03/19/20 [phone]

## 2020-03-19 NOTE — Telephone Encounter (Signed)
Pt in ER and being admitted.

## 2020-03-19 NOTE — Telephone Encounter (Signed)
Taylor Watson called stating that EMS was called last night . However, they did not take the patient to the ED , but instructed the patient to call her primary to be seen today . The patient is having temporal headaches , unable to remember where she is going what she is doing .

## 2020-03-19 NOTE — Plan of Care (Signed)

## 2020-03-19 NOTE — H&P (Addendum)
History and Physical    Taylor Watson QZE:[id number] DOB: 03/26/1919 DOA: 03/19/2020  Referring MD/NP/PA:   PCP: [name], MD   Patient coming from:  The patient is coming from home.  At baseline, pt is dependent for most of ADL.        Chief Complaint: fall and AMS  HPI: Taylor Watson is a 86 y.o. female with medical history significant of HTN, HLD, stroke, dCHF, depression, kidney stone, GERD, UTI, kidney stone, anemia, hard of hearing, who presents with AMS and UTI.   Per pt's daughter (I called her daughter by phone), pt has been more confused in the past several days. She lost her balance and fell backwards onto chair on carpeted area. No LOC or head injury.  Per her daughter, patient had some shortness of breath last night, no chest pain, cough.  Patient does not have nausea, vomiting, diarrhea.  Patient is constipated.  Patient has increased urinary frequency per her daughter, but no dysuria or burning on urination.  Patient moves all extremities.  No facial droop or slurred speech. Pt is hard of hearing, but alert to self, situation and place. Disoriented to time. Pt does not use a cane or walker. She dose not fall frequently. Patient has fever with temperature 100.3 in ED.   ED Course: pt was found to have trop 452 -->499, negative Covid PCR, WBC 5.3, urinalysis (clear appearance, trace amount of leukocyte, many bacteria, WBC 6-10), electrolytes renal function okay, temperature 100.3, blood pressure 159/84, heart rate 88, RR 18, oxygen saturation 93-97% on room air.  Chest x-ray showed COPD, without infiltration.  CT head is negative for acute intracranial abnormalities.  Patient is admitted to progressive plan as inpatient.  Cardiology, Dr. Saralyn Pilar is consulted.  CT of cervical spine: 1. No evidence of acute fracture to the cervical spine. 2. Mild C7-T1 grade 1 anterolisthesis. 3. Cervical spondylosis, greatest at C5-C6. 4. 4.1 cm right supraclavicular mass  increased in size since the prior neck CT of 12/14/2013 (measuring 3.3 cm in greatest dimension at that time). Review of prior electronic medical records shows that this mass was biopsied in 2015 and pathology was consistent with a schwannoma.   Review of Systems: Could not be reviewed accurately due to altered mental status.  Travel history: No recent long distant travel.  Allergy:  Allergies  Allergen Reactions  . Dyazide [Hydrochlorothiazide W-Triamterene] Other (See Comments)    Unknown reaction  . Toprol Xl [Metoprolol Tartrate] Other (See Comments)    unknown  . Monopril [Fosinopril] Cough  . Verapamil Other (See Comments)    headache    Past Medical History:  Diagnosis Date  . Allergy   . Anemia   . Arrhythmia   . CHF (congestive heart failure) ([place])   . CVA (cerebral vascular accident) ([place])   . Depression   . GERD (gastroesophageal reflux disease)   . History of blood transfusion   . History of kidney stones   . Hx: UTI (urinary tract infection)   . Hypercholesterolemia   . Hypertension     Past Surgical History:  Procedure Laterality Date  . CHOLECYSTECTOMY  1984  . NECK LESION BIOPSY  2015   Followed by Dr. Richardson Landry    Social History:  reports that she has quit smoking. She has quit using smokeless tobacco.  Her smokeless tobacco use included chew. She reports that she does not drink alcohol and does not use drugs.  Family History:  Family History  Problem Relation  Age of Onset  . Cancer Mother        unknown type  . Hypertension Mother   . Cancer Father        unknown type  . Cancer Daughter   . Colon cancer Other        grandfather     Prior to Admission medications   Medication Sig Start Date End Date Taking? Authorizing Provider  aspirin EC 81 MG EC tablet Take 1 tablet (81 mg total) by mouth daily. 05/20/19   Lavina Hamman, MD  calcium carbonate (TUMS - DOSED IN MG ELEMENTAL CALCIUM) 500 MG chewable tablet Chew 1 tablet (200 mg of  elemental calcium total) by mouth 3 (three) times daily as needed for indigestion or heartburn. 07/25/19   Black, Lezlie Octave, NP  escitalopram (LEXAPRO) 10 MG tablet TAKE 1/2 TABLET BY MOUTH DAILY 03/03/20   [name], MD  furosemide (LASIX) 40 MG tablet Take 0.5 tablets (20 mg total) by mouth daily. 09/13/19 09/12/20  [name], MD  potassium chloride 20 MEQ/15ML (10%) SOLN Take 15 mLs (20 mEq total) by mouth daily. 12/04/19   Darylene Price A, FNP  sacubitril-valsartan (ENTRESTO) 49-51 MG Take 1 tablet by mouth 2 (two) times daily. Patient taking differently: Take 0.5 tablets by mouth 2 (two) times daily.  11/29/19   Alisa Graff, FNP    Physical Exam: Vitals:   03/19/20 1310 03/19/20 1430 03/19/20 1653 03/19/20 1735  BP: (!) 145/81 (!) 159/84 119/61   Pulse: 88 88 81   Resp: 18  16 16   Temp:   98.9 F (37.2 C)   TempSrc:   Oral   SpO2: 97% 97% (!) 81% 100%  Weight:      Height:       General: Not in acute distress HEENT:       Eyes: PERRL, EOMI, no scleral icterus.       ENT: No discharge from the ears and nose, no pharynx injection, no tonsillar enlargement.        Neck: No JVD, no bruit, no mass felt. Heme: No neck lymph node enlargement. Cardiac: S1/S2, RRR, No murmurs, No gallops or rubs. Respiratory: No rales, wheezing, rhonchi or rubs. GI: Soft, nondistended, nontender, no organomegaly, BS present. GU: No hematuria Ext: No pitting leg edema bilaterally. 1+DP/PT pulse bilaterally. Musculoskeletal: No joint deformities, No joint redness or warmth, no limitation of ROM in spin. Skin: No rashes.  Neuro: confused, knows her own name, and is oriented to place, but not oriented to time. Cranial nerves II-XII grossly intact, moves all extremities Psych: Patient is not psychotic, no suicidal or hemocidal ideation.  Labs on Admission: I have personally reviewed following labs and imaging studies  CBC: Recent Labs  Lab 03/19/20 1236  WBC 5.2  NEUTROABS 3.7  HGB 12.3    HCT 38.4  MCV 91.4  PLT 347   Basic Metabolic Panel: Recent Labs  Lab 03/19/20 1236  NA 137  K 4.2  CL 94*  CO2 32  GLUCOSE 89  BUN 27*  CREATININE 0.98  CALCIUM 9.8   GFR: Estimated Creatinine Clearance: 25.3 mL/min (by C-G formula based on SCr of 0.98 mg/dL). Liver Function Tests: No results for input(s): AST, ALT, ALKPHOS, BILITOT, PROT, ALBUMIN in the last 168 hours. No results for input(s): LIPASE, AMYLASE in the last 168 hours. No results for input(s): AMMONIA in the last 168 hours. Coagulation Profile: Recent Labs  Lab 03/19/20 1455  INR 1.0   Cardiac Enzymes:  No results for input(s): CKTOTAL, CKMB, CKMBINDEX, TROPONINI in the last 168 hours. BNP (last 3 results) No results for input(s): PROBNP in the last 8760 hours. HbA1C: No results for input(s): HGBA1C in the last 72 hours. CBG: No results for input(s): GLUCAP in the last 168 hours. Lipid Profile: No results for input(s): CHOL, HDL, LDLCALC, TRIG, CHOLHDL, LDLDIRECT in the last 72 hours. Thyroid Function Tests: No results for input(s): TSH, T4TOTAL, FREET4, T3FREE, THYROIDAB in the last 72 hours. Anemia Panel: No results for input(s): VITAMINB12, FOLATE, FERRITIN, TIBC, IRON, RETICCTPCT in the last 72 hours. Urine analysis:    Component Value Date/Time   COLORURINE STRAW (A) 03/19/2020 1341   APPEARANCEUR CLEAR (A) 03/19/2020 1341   APPEARANCEUR Clear 09/21/2011 0059   LABSPEC 1.008 03/19/2020 1341   LABSPEC 1.005 09/21/2011 0059   PHURINE 6.0 03/19/2020 1341   GLUCOSEU NEGATIVE 03/19/2020 1341   GLUCOSEU Negative 09/21/2011 0059   HGBUR SMALL (A) 03/19/2020 1341   BILIRUBINUR NEGATIVE 03/19/2020 1341   BILIRUBINUR Negative 09/21/2011 0059   KETONESUR NEGATIVE 03/19/2020 1341   PROTEINUR NEGATIVE 03/19/2020 1341   NITRITE NEGATIVE 03/19/2020 1341   LEUKOCYTESUR TRACE (A) 03/19/2020 1341   LEUKOCYTESUR 1+ 09/21/2011 0059   Sepsis Labs: @LABRCNTIP (procalcitonin:4,lacticidven:4) ) Recent  Results (from the past 240 hour(s))  Respiratory Panel by RT PCR (Flu A&B, Covid) - Nasopharyngeal Swab     Status: None   Collection Time: 03/19/20  1:41 PM   Specimen: Nasopharyngeal Swab  Result Value Ref Range Status   SARS Coronavirus 2 by RT PCR NEGATIVE NEGATIVE Final    Comment: (NOTE) SARS-CoV-2 target nucleic acids are NOT DETECTED.  The SARS-CoV-2 RNA is generally detectable in upper respiratoy specimens during the acute phase of infection. The lowest concentration of SARS-CoV-2 viral copies this assay can detect is 131 copies/mL. A negative result does not preclude SARS-Cov-2 infection and should not be used as the sole basis for treatment or other patient management decisions. A negative result may occur with  improper specimen collection/handling, submission of specimen other than nasopharyngeal swab, presence of viral mutation(s) within the areas targeted by this assay, and inadequate number of viral copies (<131 copies/mL). A negative result must be combined with clinical observations, patient history, and epidemiological information. The expected result is Negative.  Fact Sheet for Patients:  PinkCheek.be  Fact Sheet for Healthcare Providers:  GravelBags.it  This test is no t yet approved or cleared by the [place] FDA and  has been authorized for detection and/or diagnosis of SARS-CoV-2 by FDA under an Emergency Use Authorization (EUA). This EUA will remain  in effect (meaning this test can be used) for the duration of the COVID-19 declaration under Section 564(b)(1) of the Act, 21 U.S.C. section 360bbb-3(b)(1), unless the authorization is terminated or revoked sooner.     Influenza A by PCR NEGATIVE NEGATIVE Final   Influenza B by PCR NEGATIVE NEGATIVE Final    Comment: (NOTE) The Xpert Xpress SARS-CoV-2/FLU/RSV assay is intended as an aid in  the diagnosis of influenza from Nasopharyngeal swab  specimens and  should not be used as a sole basis for treatment. Nasal washings and  aspirates are unacceptable for Xpert Xpress SARS-CoV-2/FLU/RSV  testing.  Fact Sheet for Patients: PinkCheek.be  Fact Sheet for Healthcare Providers: GravelBags.it  This test is not yet approved or cleared by the [place] FDA and  has been authorized for detection and/or diagnosis of SARS-CoV-2 by  FDA under an Emergency Use Authorization (EUA). This EUA will  remain  in effect (meaning this test can be used) for the duration of the  Covid-19 declaration under Section 564(b)(1) of the Act, 21  U.S.C. section 360bbb-3(b)(1), unless the authorization is  terminated or revoked. Performed at [facility], [address], [place], [place] [address]      Radiological Exams on Admission: DG Chest 2 View  Result Date: 03/19/2020 CLINICAL DATA:  Confusion, fall EXAM: CHEST - 2 VIEW COMPARISON:  11/04/2019, 05/17/2019 FINDINGS: Heart size appears mildly enlarged. Densely calcified, tortuous aorta with a similar degree of ectasias a within the ascending aorta. Hyperinflated lungs with coarsened interstitial markings bilaterally. No focal airspace consolidation, pleural effusion, or pneumothorax. Persistent prominence of the soft tissues in the right supraclavicular region, which has been previously evaluated and biopsied. Osseous structures appear demineralized. Advanced degenerative changes throughout the thoracic spine. No acute osseous abnormality is identified. IMPRESSION: 1. COPD without acute cardiopulmonary abnormality. 2. Densely calcified, tortuous aorta. Electronically Signed   By: Davina Poke D.O.   On: 03/19/2020 13:29   CT Head Wo Contrast  Result Date: 03/19/2020 CLINICAL DATA:  Head trauma, minor. Additional history provided: Fall, mild headache. EXAM: CT HEAD WITHOUT CONTRAST CT CERVICAL SPINE WITHOUT CONTRAST TECHNIQUE:  Multidetector CT imaging of the head and cervical spine was performed following the standard protocol without intravenous contrast. Multiplanar CT image reconstructions of the cervical spine were also generated. COMPARISON:  Brain MRI 05/18/2019. Head CT 05/18/2019. Neck CT 12/14/2013. FINDINGS: CT HEAD FINDINGS Brain: Mild generalized cerebral atrophy. Moderate/advanced ill-defined hypoattenuation within the cerebral white matter is nonspecific, but compatible with chronic small vessel ischemic disease. There is no acute intracranial hemorrhage. No demarcated cortical infarct. No extra-axial fluid collection. No evidence of intracranial mass. No midline shift. Vascular: No hyperdense vessel.  Atherosclerotic calcifications. Skull: No calvarial fracture. 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 Sinuses/Orbits: Visualized orbits show no acute finding. Mild ethmoid sinus mucosal thickening. Small left sphenoid sinus mucous retention cyst. CT CERVICAL SPINE FINDINGS Alignment: Trace C7-T1 grade 1 anterolisthesis. Skull base and vertebrae: The basion-dental and atlanto-dental intervals are maintained.No evidence of acute fracture to the cervical spine. Soft tissues and spinal canal: No prevertebral fluid or swelling. No visible canal hematoma. Disc levels: Cervical spondylosis. Most notably at C5-C6, there is severe disc degeneration with degenerative endplate irregularity and sclerosis, a posterior disc osteophyte complex and uncovertebral hypertrophy. Upper chest: No consolidation within the imaged lung apices. No visible pneumothorax. Other: Right supraclavicular mass measuring 4.1 x 3.2 x 3.9 cm. IMPRESSION: CT head: 1. No evidence of acute intracranial abnormality. 2. Mild cerebral atrophy with moderate/advanced chronic small vessel ischemic disease, stable as compared to the brain MRI of 05/18/2019. 3.  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 CT cervical spine: 1. No evidence of acute fracture to the cervical spine. 2. Mild C7-T1 grade 1 anterolisthesis. 3. Cervical spondylosis, greatest at C5-C6. 4. 4.1 cm right supraclavicular mass increased in size since the prior neck CT of 12/14/2013 (measuring 3.3 cm in greatest dimension at that time). Review of prior electronic medical records shows that this mass was biopsied in 2015 and pathology was consistent with a schwannoma. Electronically Signed   By: Kellie Simmering DO   On: 03/19/2020 13:12   CT Cervical Spine Wo Contrast  Result Date: 03/19/2020 CLINICAL DATA:  Head trauma, minor. Additional history provided: Fall, mild headache. EXAM: CT HEAD  WITHOUT CONTRAST CT CERVICAL SPINE WITHOUT CONTRAST TECHNIQUE: Multidetector CT imaging of the head and cervical spine was performed following the standard protocol without intravenous contrast. Multiplanar CT image reconstructions of the cervical spine were also generated. COMPARISON:  Brain MRI 05/18/2019. Head CT 05/18/2019. Neck CT 12/14/2013. FINDINGS: CT HEAD FINDINGS Brain: Mild generalized cerebral atrophy. Moderate/advanced ill-defined hypoattenuation within the cerebral white matter is nonspecific, but compatible with chronic small vessel ischemic disease. There is no acute intracranial hemorrhage. No demarcated cortical infarct. No extra-axial fluid collection. No evidence of intracranial mass. No midline shift. Vascular: No hyperdense vessel.  Atherosclerotic calcifications. Skull: No calvarial fracture. 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 Sinuses/Orbits: Visualized orbits show no acute finding. Mild ethmoid sinus mucosal thickening. Small left sphenoid sinus mucous retention cyst. CT CERVICAL SPINE  FINDINGS Alignment: Trace C7-T1 grade 1 anterolisthesis. Skull base and vertebrae: The basion-dental and atlanto-dental intervals are maintained.No evidence of acute fracture to the cervical spine. Soft tissues and spinal canal: No prevertebral fluid or swelling. No visible canal hematoma. Disc levels: Cervical spondylosis. Most notably at C5-C6, there is severe disc degeneration with degenerative endplate irregularity and sclerosis, a posterior disc osteophyte complex and uncovertebral hypertrophy. Upper chest: No consolidation within the imaged lung apices. No visible pneumothorax. Other: Right supraclavicular mass measuring 4.1 x 3.2 x 3.9 cm. IMPRESSION: CT head: 1. No evidence of acute intracranial abnormality. 2. Mild cerebral atrophy with moderate/advanced chronic small vessel ischemic disease, stable as compared to the brain MRI of 05/18/2019. 3. 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 CT cervical spine: 1. No evidence of acute fracture to the cervical spine. 2. Mild C7-T1 grade 1 anterolisthesis. 3. Cervical spondylosis, greatest at C5-C6. 4. 4.1 cm right supraclavicular mass increased in size since the prior neck CT of 12/14/2013 (measuring 3.3 cm in greatest dimension at that time). Review of prior electronic medical records shows that this mass was biopsied in 2015 and pathology was consistent with a schwannoma. Electronically Signed   By: Kellie Simmering DO   On: 03/19/2020 13:12     EKG: I have personally reviewed.  Sinus rhythm, QTC 435, LAD, incomplete right bundle blockade, PAC, blocked PAC.  Assessment/Plan Principal Problem:   Elevated troponin Active Problems:   History of CVA (cerebrovascular accident)   Essential hypertension, benign   Mild depression (HCC)   Acute metabolic encephalopathy   Fall at home, initial encounter   UTI (urinary tract infection)   Chronic diastolic CHF  (congestive heart failure) (HCC)   NSTEMI (non-ST elevated myocardial infarction) (HCC)   Elevated troponin and possible NSTEMI: Patient has altered mental status, cannot provide accurate information about chest pain.  Per her daughter, patient does not seem to have chest pain, but patient had some shortness of breath last night.  Troponin 252 --> 499.  Dr. Saralyn Pilar of cardiology is consulted. Per card PA, Clabe Seal, no IV heparin at this moment, they recommended conservative management.  - admit to progressive unit as inpatient - Trend Trop - Repeat EKG in the am  - start aspirin and lipitor  - Risk factor stratification: will check FLP and A1C  - 2d echo  History of CVA (cerebrovascular accident) On ASA and lipitor  Essential hypertension, benign -IV hydralazine as needed -Patient is on Lasix and Entresto  Mild depression (HCC) -Lexapro  Acute metabolic encephalopathy: CT head negative for acute intracranial abnormalities.  May be due to UTI -Frequent neuro check  Fall at home, initial encounter -PT/OT  UTI (urinary tract infection) -Follow-up of blood culture and urine culture -IV Rocephin  Chronic diastolic CHF (congestive heart failure) ([place]): No leg edema or JVD.  No respiratory distress.  CHF seem to be compensated. -Continue home Lasix 20 mg daily -Check BNP         DVT ppx:  SQ Lovenox Code Status: Full code (per pt's daughter, pt's grandson is the power of attorney, who wants patient to be full code) Family Communication:  Yes, patient's daughter by phone Disposition Plan:  Anticipate discharge back to previous environment Consults called:  Dr. Saralyn Pilar of cardiology Admission status:  progressive unit as inpt       Status is: Inpatient  Remains inpatient appropriate because:Inpatient level of care appropriate due to severity of illness.  Patient has multiple comorbidities, now presents with possible UTI, fall, elevated troponin and possible  non-STEMI.  Her presentation is highly complicated.  Patient is at high risk of deteriorating given her old age.  We need to be treated in hospital for at least 2 days.   Dispo: The patient is from: Home              Anticipated d/c is to: Home              Anticipated d/c date is: 2 days              Patient currently is not medically stable to d/c.           Date of Service 03/19/2020    [facility]   If 7PM-7AM, please contact night-coverage [email or web address] 03/19/2020, 5:51 PM

## 2020-03-20 ENCOUNTER — Telehealth: Payer: Self-pay | Admitting: Internal Medicine

## 2020-03-20 DIAGNOSIS — R778 Other specified abnormalities of plasma proteins: Secondary | ICD-10-CM | POA: Diagnosis not present

## 2020-03-20 DIAGNOSIS — R531 Weakness: Secondary | ICD-10-CM

## 2020-03-20 DIAGNOSIS — R7989 Other specified abnormal findings of blood chemistry: Secondary | ICD-10-CM | POA: Diagnosis not present

## 2020-03-20 LAB — CBC
HCT: 42.1 % (ref 36.0–46.0)
Hemoglobin: 13.5 g/dL (ref 12.0–15.0)
MCH: 29.8 pg (ref 26.0–34.0)
MCHC: 32.1 g/dL (ref 30.0–36.0)
MCV: 92.9 fL (ref 80.0–100.0)
Platelets: 198 10*3/uL (ref 150–400)
RBC: 4.53 MIL/uL (ref 3.87–5.11)
RDW: 15 % (ref 11.5–15.5)
WBC: 3.6 10*3/uL — ABNORMAL LOW (ref 4.0–10.5)
nRBC: 0 % (ref 0.0–0.2)

## 2020-03-20 LAB — LIPID PANEL
Cholesterol: 247 mg/dL — ABNORMAL HIGH (ref 0–200)
HDL: 66 mg/dL (ref >40)
LDL Cholesterol: 165 mg/dL — ABNORMAL HIGH (ref 0–99)
Total CHOL/HDL Ratio: 3.7 RATIO
Triglycerides: 81 mg/dL (ref <150)
VLDL: 16 mg/dL (ref 0–40)

## 2020-03-20 LAB — ECHOCARDIOGRAM COMPLETE
AR max vel: 1.71 cm2
AV Area VTI: 1.75 cm2
AV Area mean vel: 1.47 cm2
AV Mean grad: 6 mmHg
AV Peak grad: 10 mmHg
Ao pk vel: 1.58 m/s
Area-P 1/2: 2.4 cm2
Calc EF: 61.2 %
Height: 63 in
Single Plane A2C EF: 63.1 %
Single Plane A4C EF: 71.7 %
Weight: 1808 oz

## 2020-03-20 LAB — BASIC METABOLIC PANEL
Anion gap: 11 (ref 5–15)
BUN: 21 mg/dL (ref 8–23)
CO2: 33 mmol/L — ABNORMAL HIGH (ref 22–32)
Calcium: 9.7 mg/dL (ref 8.9–10.3)
Chloride: 98 mmol/L (ref 98–111)
Creatinine, Ser: 0.95 mg/dL (ref 0.44–1.00)
GFR, Estimated: 54 mL/min — ABNORMAL LOW (ref >60)
Glucose, Bld: 78 mg/dL (ref 70–99)
Potassium: 4 mmol/L (ref 3.5–5.1)
Sodium: 142 mmol/L (ref 135–145)

## 2020-03-20 LAB — TROPONIN I (HIGH SENSITIVITY)
Troponin I (High Sensitivity): 414 ng/L (ref <18)
Troponin I (High Sensitivity): 341 ng/L (ref <18)

## 2020-03-20 LAB — HEMOGLOBIN A1C
Hgb A1c MFr Bld: 5.7 % — ABNORMAL HIGH (ref 4.8–5.6)
Mean Plasma Glucose: 116.89 mg/dL

## 2020-03-20 LAB — MAGNESIUM: Magnesium: 2.1 mg/dL (ref 1.7–2.4)

## 2020-03-20 MED ORDER — CEFDINIR 300 MG PO CAPS: 300.0000 mg | ORAL_CAPSULE | Freq: Two times a day (BID) | ORAL | 0 refills | Status: AC

## 2020-03-20 NOTE — Telephone Encounter (Signed)
Ok

## 2020-03-20 NOTE — Telephone Encounter (Signed)
Patient is currently in hospital with a UTI. Son called to see if Dr. Nicki Reaper could write a prescription to get patient a left chair.

## 2020-03-20 NOTE — Progress Notes (Signed)
Mobility Specialist - Progress Note   03/20/20 1200  Mobility  Activity Ambulated to bathroom  Level of Assistance Moderate assist, patient does 50-74%  Assistive Device Front wheel walker  Distance Ambulated (ft) 20 ft  Mobility Response Tolerated well  Mobility performed by Mobility specialist  $Mobility charge 1 Mobility    Pre-mobility: 89 HR During mobility: 78 HR  Post-mobility: 75 HR   Pt was lying in bed upon arrival utilizing room air. Pt requested to use the bathroom. Pt was able to get EOB and stand with minA. Noted pt incontinent of BM as pt comes into upright position. Pt ambulated with RW and modA for balance and steadying. Noted mild LOBx2 but was easily corrected with time and cueing. Pt cued to use slow and steady motions as standing-balance decreases with speed. Pt had a BM, mobility assisted with hygiene. Overall, pt tolerated well. Pt was able to ambulate laterally to [facility] before return to supine. VC and demonstration needed for lateral ambulation. Pt was left in bed with all needs in reach and alarm set. O2 sats not obtained d/t poor pleth. Pt denied SOB throughout session. Nurse notified.    Kathee Delton Mobility Specialist 03/20/20, 1:03 PM

## 2020-03-20 NOTE — Progress Notes (Signed)
[facility] Cardiology    SUBJECTIVE: The patient is unsure why she came into the hospital, but denies any recent chest pain, shortness of breath, or palpitations. She states that she has had some pressure in her bladder prior to admission, but denies dysuria. At this time, she denies pain and has no complaints.   Vitals:   03/20/20 0000 03/20/20 0400 03/20/20 0518 03/20/20 0758  BP: (!) 114/44 (!) 131/49 (!) 124/49 137/60  Pulse:  93  64  Resp:  18 19 17   Temp:  98.3 F (36.8 C)  98.5 F (36.9 C)  TempSrc:  Oral  Oral  SpO2:  100%  100%  Weight:  63.5 kg    Height:         Intake/Output Summary (Last 24 hours) at 03/20/2020 0854 Last data filed at 03/19/2020 1613 Gross per 24 hour  Intake 100 ml  Output --  Net 100 ml      PHYSICAL EXAM  General: Frail, elderly female, lying on her side in bed, nearly flat, in no acute distress, smiling at times HEENT:  Normocephalic and atramatic Neck:  No JVD.  Lungs: Clear bilaterally to auscultation, normal effort of breathing on room air. Speaking in full sentences Heart: HRRR . Normal S1 and S2 without gallops or murmurs.  Abdomen: no obvious distention Msk: kyphosis, no obvious lower or upper extremity deformities Extremities: No clubbing, cyanosis or edema.   Neuro: Alert and oriented X 3. Psych:  Good affect, responds appropriately   LABS: Basic Metabolic Panel: Recent Labs    03/19/20 1236 03/20/20 0648  NA 137 142  K 4.2 4.0  CL 94* 98  CO2 32 33*  GLUCOSE 89 78  BUN 27* 21  CREATININE 0.98 0.95  CALCIUM 9.8 9.7  MG  --  2.1   Liver Function Tests: No results for input(s): AST, ALT, ALKPHOS, BILITOT, PROT, ALBUMIN in the last 72 hours. No results for input(s): LIPASE, AMYLASE in the last 72 hours. CBC: Recent Labs    03/19/20 1236 03/20/20 0648  WBC 5.2 3.6*  NEUTROABS 3.7  --   HGB 12.3 13.5  HCT 38.4 42.1  MCV 91.4 92.9  PLT 221 198   Cardiac Enzymes: No results for input(s): CKTOTAL, CKMB, CKMBINDEX,  TROPONINI in the last 72 hours. BNP: Invalid input(s): POCBNP D-Dimer: No results for input(s): DDIMER in the last 72 hours. Hemoglobin A1C: No results for input(s): HGBA1C in the last 72 hours. Fasting Lipid Panel: Recent Labs    03/20/20 0648  CHOL 247*  HDL 66  LDLCALC 165*  TRIG 81  CHOLHDL 3.7   Thyroid Function Tests: No results for input(s): TSH, T4TOTAL, T3FREE, THYROIDAB in the last 72 hours.  Invalid input(s): FREET3 Anemia Panel: No results for input(s): VITAMINB12, FOLATE, FERRITIN, TIBC, IRON, RETICCTPCT in the last 72 hours.  DG Chest 2 View  Result Date: 03/19/2020 CLINICAL DATA:  Confusion, fall EXAM: CHEST - 2 VIEW COMPARISON:  11/04/2019, 05/17/2019 FINDINGS: Heart size appears mildly enlarged. Densely calcified, tortuous aorta with a similar degree of ectasias a within the ascending aorta. Hyperinflated lungs with coarsened interstitial markings bilaterally. No focal airspace consolidation, pleural effusion, or pneumothorax. Persistent prominence of the soft tissues in the right supraclavicular region, which has been previously evaluated and biopsied. Osseous structures appear demineralized. Advanced degenerative changes throughout the thoracic spine. No acute osseous abnormality is identified. IMPRESSION: 1. COPD without acute cardiopulmonary abnormality. 2. Densely calcified, tortuous aorta. Electronically Signed   By: Davina Poke D.O.  On: 03/19/2020 13:29   CT Head Wo Contrast  Result Date: 03/19/2020 CLINICAL DATA:  Head trauma, minor. Additional history provided: Fall, mild headache. EXAM: CT HEAD WITHOUT CONTRAST CT CERVICAL SPINE WITHOUT CONTRAST TECHNIQUE: Multidetector CT imaging of the head and cervical spine was performed following the standard protocol without intravenous contrast. Multiplanar CT image reconstructions of the cervical spine were also generated. COMPARISON:  Brain MRI 05/18/2019. Head CT 05/18/2019. Neck CT 12/14/2013. FINDINGS: CT  HEAD FINDINGS Brain: Mild generalized cerebral atrophy. Moderate/advanced ill-defined hypoattenuation within the cerebral white matter is nonspecific, but compatible with chronic small vessel ischemic disease. There is no acute intracranial hemorrhage. No demarcated cortical infarct. No extra-axial fluid collection. No evidence of intracranial mass. No midline shift. Vascular: No hyperdense vessel.  Atherosclerotic calcifications. Skull: No calvarial fracture. 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 Sinuses/Orbits: Visualized orbits show no acute finding. Mild ethmoid sinus mucosal thickening. Small left sphenoid sinus mucous retention cyst. CT CERVICAL SPINE FINDINGS Alignment: Trace C7-T1 grade 1 anterolisthesis. Skull base and vertebrae: The basion-dental and atlanto-dental intervals are maintained.No evidence of acute fracture to the cervical spine. Soft tissues and spinal canal: No prevertebral fluid or swelling. No visible canal hematoma. Disc levels: Cervical spondylosis. Most notably at C5-C6, there is severe disc degeneration with degenerative endplate irregularity and sclerosis, a posterior disc osteophyte complex and uncovertebral hypertrophy. Upper chest: No consolidation within the imaged lung apices. No visible pneumothorax. Other: Right supraclavicular mass measuring 4.1 x 3.2 x 3.9 cm. IMPRESSION: CT head: 1. No evidence of acute intracranial abnormality. 2. Mild cerebral atrophy with moderate/advanced chronic small vessel ischemic disease, stable as compared to the brain MRI of 05/18/2019. 3. 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 CT cervical spine: 1. No evidence of acute fracture to the cervical spine. 2. Mild C7-T1 grade 1 anterolisthesis. 3. Cervical spondylosis, greatest at C5-C6. 4.  4.1 cm right supraclavicular mass increased in size since the prior neck CT of 12/14/2013 (measuring 3.3 cm in greatest dimension at that time). Review of prior electronic medical records shows that this mass was biopsied in 2015 and pathology was consistent with a schwannoma. Electronically Signed   By: Kellie Simmering DO   On: 03/19/2020 13:12   CT Cervical Spine Wo Contrast  Result Date: 03/19/2020 CLINICAL DATA:  Head trauma, minor. Additional history provided: Fall, mild headache. EXAM: CT HEAD WITHOUT CONTRAST CT CERVICAL SPINE WITHOUT CONTRAST TECHNIQUE: Multidetector CT imaging of the head and cervical spine was performed following the standard protocol without intravenous contrast. Multiplanar CT image reconstructions of the cervical spine were also generated. COMPARISON:  Brain MRI 05/18/2019. Head CT 05/18/2019. Neck CT 12/14/2013. FINDINGS: CT HEAD FINDINGS Brain: Mild generalized cerebral atrophy. Moderate/advanced ill-defined hypoattenuation within the cerebral white matter is nonspecific, but compatible with chronic small vessel ischemic disease. There is no acute intracranial hemorrhage. No demarcated cortical infarct. No extra-axial fluid collection. No evidence of intracranial mass. No midline shift. Vascular: No hyperdense vessel.  Atherosclerotic calcifications. Skull: No calvarial fracture. 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 Sinuses/Orbits: Visualized orbits show no acute finding. Mild ethmoid sinus mucosal thickening. Small left sphenoid sinus mucous retention cyst. CT CERVICAL SPINE FINDINGS Alignment: Trace C7-T1 grade 1 anterolisthesis. Skull base and vertebrae: The basion-dental and atlanto-dental intervals are maintained.No evidence of acute fracture to the cervical spine. Soft  tissues and spinal canal: No prevertebral fluid or swelling. No visible canal hematoma. Disc levels:  Cervical spondylosis. Most notably at C5-C6, there is severe disc degeneration with degenerative endplate irregularity and sclerosis, a posterior disc osteophyte complex and uncovertebral hypertrophy. Upper chest: No consolidation within the imaged lung apices. No visible pneumothorax. Other: Right supraclavicular mass measuring 4.1 x 3.2 x 3.9 cm. IMPRESSION: CT head: 1. No evidence of acute intracranial abnormality. 2. Mild cerebral atrophy with moderate/advanced chronic small vessel ischemic disease, stable as compared to the brain MRI of 05/18/2019. 3. 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 CT cervical spine: 1. No evidence of acute fracture to the cervical spine. 2. Mild C7-T1 grade 1 anterolisthesis. 3. Cervical spondylosis, greatest at C5-C6. 4. 4.1 cm right supraclavicular mass increased in size since the prior neck CT of 12/14/2013 (measuring 3.3 cm in greatest dimension at that time). Review of prior electronic medical records shows that this mass was biopsied in 2015 and pathology was consistent with a schwannoma. Electronically Signed   By: Kellie Simmering DO   On: 03/19/2020 13:12   ECHOCARDIOGRAM COMPLETE  Result Date: 03/20/2020    ECHOCARDIOGRAM REPORT   Patient Name:   Taylor Watson Date of Exam: 03/19/2020 Medical Rec #:  [id number]          Height:       63.0 in Accession #:    [id number]         Weight:       113.0 lb Date of Birth:  [birth year]          BSA:          1.517 m Patient Age:    86 years           BP:           119/61 mmHg Patient Gender: F                  HR:           71 bpm. Exam Location:  [facility] Procedure: 2D Echo, Color Doppler and Cardiac Doppler Indications:     Elevated troponin  History:         Patient has prior history of Echocardiogram examinations, most                  recent 07/19/2019. Risk Factors:Hypertension and HCL.  Sonographer:     Charmayne Sheer RDCS (AE)  Referring Phys:  Baker Janus Soledad Gerlach NIU Diagnosing Phys: Kate Sable MD  Sonographer Comments: No parasternal window and suboptimal apical window. Image acquisition challenging due to patient body habitus. IMPRESSIONS  1. Left ventricular ejection fraction, by estimation, is 60 to 65%. The left ventricle has normal function. The left ventricle has no regional wall motion abnormalities. There is mild left ventricular hypertrophy. Left ventricular diastolic parameters are consistent with Grade I diastolic dysfunction (impaired relaxation).  2. Right ventricular systolic function is normal. The right ventricular size is normal. There is mildly elevated pulmonary artery systolic pressure.  3. The mitral valve is degenerative. No evidence of mitral valve regurgitation. Mild mitral stenosis.  4. The aortic valve was not well visualized. Aortic valve regurgitation is not visualized.  5. Pulmonic valve regurgitation not assessed.  6. The inferior vena cava is normal in size with greater than 50% respiratory variability, suggesting right atrial pressure of 3 mmHg. FINDINGS  Left Ventricle: Left ventricular ejection fraction, by estimation, is 60  to 65%. The left ventricle has normal function. The left ventricle has no regional wall motion abnormalities. The left ventricular internal cavity size was normal in size. There is  mild left ventricular hypertrophy. Left ventricular diastolic parameters are consistent with Grade I diastolic dysfunction (impaired relaxation). Right Ventricle: The right ventricular size is normal. Right vetricular wall thickness was not well visualized. Right ventricular systolic function is normal. There is mildly elevated pulmonary artery systolic pressure. The tricuspid regurgitant velocity  is 2.88 m/s, and with an assumed right atrial pressure of 3 mmHg, the estimated right ventricular systolic pressure is 97.3 mmHg. Left Atrium: Left atrial size was normal in size. Right Atrium: Right atrial size  was normal in size. Pericardium: There is no evidence of pericardial effusion. Mitral Valve: The mitral valve is degenerative in appearance. Mild mitral annular calcification. No evidence of mitral valve regurgitation. Mild mitral valve stenosis. MV peak gradient, 14.3 mmHg. The mean mitral valve gradient is 5.0 mmHg. Tricuspid Valve: The tricuspid valve is normal in structure. Tricuspid valve regurgitation is mild. Aortic Valve: The aortic valve was not well visualized. Aortic valve regurgitation is not visualized. Aortic valve mean gradient measures 6.0 mmHg. Aortic valve peak gradient measures 10.0 mmHg. Aortic valve area, by VTI measures 1.75 cm. Pulmonic Valve: The pulmonic valve was not well visualized. Pulmonic valve regurgitation not assessed. Aorta: The aortic root was not well visualized. Venous: The inferior vena cava is normal in size with greater than 50% respiratory variability, suggesting right atrial pressure of 3 mmHg. IAS/Shunts: No atrial level shunt detected by color flow Doppler.  LEFT VENTRICLE PLAX 2D LVOT diam:     1.80 cm     Diastology LV SV:         48          LV e' medial:    5.11 cm/s LV SV Index:   31          LV E/e' medial:  17.3 LVOT Area:     2.54 cm    LV e' lateral:   5.77 cm/s                            LV E/e' lateral: 15.4  LV Volumes (MOD) LV vol d, MOD A2C: 31.4 ml LV vol d, MOD A4C: 40.6 ml LV vol s, MOD A2C: 11.6 ml LV vol s, MOD A4C: 11.5 ml LV SV MOD A2C:     19.8 ml LV SV MOD A4C:     40.6 ml LV SV MOD BP:      21.6 ml RIGHT VENTRICLE RV Basal diam:  2.59 cm LEFT ATRIUM             Index       RIGHT ATRIUM          Index LA Vol (A2C):   49.4 ml 32.56 ml/m RA Area:     8.05 cm LA Vol (A4C):   16.8 ml 11.07 ml/m RA Volume:   12.70 ml 8.37 ml/m LA Biplane Vol: 28.9 ml 19.05 ml/m  AORTIC VALVE AV Area (Vmax):    1.71 cm AV Area (Vmean):   1.47 cm AV Area (VTI):     1.75 cm AV Vmax:           158.00 cm/s AV Vmean:          120.000 cm/s AV VTI:            0.272  m AV  Peak Grad:      10.0 mmHg AV Mean Grad:      6.0 mmHg LVOT Vmax:         106.00 cm/s LVOT Vmean:        69.400 cm/s LVOT VTI:          0.187 m LVOT/AV VTI ratio: 0.69  AORTA Ao Root diam: 2.50 cm MITRAL VALVE                TRICUSPID VALVE MV Area (PHT): 2.40 cm     TR Peak grad:   33.2 mmHg MV Peak grad:  14.3 mmHg    TR Vmax:        288.00 cm/s MV Mean grad:  5.0 mmHg MV Vmax:       1.89 m/s     SHUNTS MV Vmean:      103.0 cm/s   Systemic VTI:  0.19 m MV Decel Time: 316 msec     Systemic Diam: 1.80 cm MV E velocity: 88.60 cm/s MV A velocity: 174.00 cm/s MV E/A ratio:  0.51 Kate Sable MD Electronically signed by Kate Sable MD Signature Date/Time: 03/20/2020/8:45:21 AM    Final      Echo pending  TELEMETRY: sinus rhythm, 67 bpm  ASSESSMENT AND PLAN:  Principal Problem:   Elevated troponin Active Problems:   History of CVA (cerebrovascular accident)   Essential hypertension, benign   Mild depression (HCC)   Acute metabolic encephalopathy   Fall at home, initial encounter   UTI (urinary tract infection)   Chronic diastolic CHF (congestive heart failure) ([place])   NSTEMI (non-ST elevated myocardial infarction) ([place])    1. Elevated troponin, down-trending, in the absence of chest pain or shortness of breath. ECG this morning is without ECG changes.  2. Altered mental status, likely due to UTI. Head CT negative for acute intracranial abnormality. Patient much more alert and oriented this morning, answers questions appropriately. 3. UTI, on antibiotics 4. Chronic diastolic CHF, no recent weight gain, leg edema, with possible recent shortness of breath, not currently in respiratory distress, with negative chest xray. BNP mildly elevated.  5. Hypertension, adequately controlled this morning  Recommendations: 1. Defer heparin drip at this time; recommend conservative approach of elevated troponin in light of patient's advanced age, frailty, AMS, and without significant ECG  changes. 2. Treatment of UTI per hospitalist 3. Continue aspirin, Entresto and oral Lasix 4. Review 2D echocardiogram   Clabe Seal, PA-C 03/20/2020 8:54 AM  Discussed with Dr. Saralyn Pilar who agrees with the above plan.

## 2020-03-20 NOTE — Telephone Encounter (Signed)
Ok to print dme for lift chair

## 2020-03-20 NOTE — Care Management CC44 (Signed)
Condition Code 44 Documentation Completed  Patient Details  Name: Taylor Watson MRN: [id number] Date of Birth: [birth year]   Condition Code 44 given:  Yes Patient signature on Condition Code 44 notice:  Yes Documentation of 2 MD's agreement:  Yes Code 44 added to claim:  Yes    Kerin Salen, RN 03/20/2020, 2:54 PM

## 2020-03-20 NOTE — Discharge Summary (Signed)
Physician Discharge Summary  Taylor Watson ASN:[id number] DOB: 1919-02-28 DOA: 03/19/2020  PCP: [name], MD  Admit date: 03/19/2020 Discharge date: 03/20/2020  Admitted From: Home Disposition: Home   Recommendations for Outpatient Follow-up:  1. Follow up with PCP in 1-2 weeks 2. Please obtain BMP/CBC in one week 3. Please follow up on the following pending results: Urine culture results  Home Health: Yes Equipment/Devices: None Discharge Condition: Fair CODE STATUS: Full Diet recommendation: Heart Healthy  Brief/Interim Summary: Taylor Watson is a 86 y.o. female with medical history significant of HTN, HLD, stroke, dCHF, depression, kidney stone, GERD, UTI, kidney stone, anemia, hard of hearing, who presents with AMS and UTI.   UA with pyuria, urine cultures pending.  Blood cultures remain negative.  Patient was given ceftriaxone while in the hospital and discharged on cefdinir based on 1 prior positive urine culture with procidentia rettgeri and its susceptibility. Patient remained afebrile, except she had 1 episode of temperature of 100.3 on arrival.  No leukocytosis.  Also found to have elevated troponin which peaked at 499.  Denies any chest pain or shortness of breath.  Echocardiogram without any significant abnormality, normal EF and no wall motion abnormalities.  Cardiology was also consulted and they were recommending medical management.  Patient is high risk for mortality secondary to her advanced age and other comorbidities.  She is dependent for all her ADLs and a very frail lady.  Quite hard of hearing and does not know why she is here. Home health services to help her daughter who is the main caretaker was ordered.  She will continue with rest of her home meds and follow-up with her provider.  Discharge Diagnoses:  Principal Problem:   Elevated troponin Active Problems:   History of CVA (cerebrovascular accident)   Essential hypertension, benign    Mild depression (HCC)   Acute metabolic encephalopathy   Fall at home, initial encounter   UTI (urinary tract infection)   Chronic diastolic CHF (congestive heart failure) (HCC)   NSTEMI (non-ST elevated myocardial infarction) [facility])  Discharge Instructions  Discharge Instructions    Diet - low sodium heart healthy   Complete by: As directed    Discharge instructions   Complete by: As directed    It was pleasure taking care of you. You are being given antibiotics for 5 days for your urinary tract infection, please take it as directed. Please follow-up with your primary care provider in 1 to 2 weeks.   Increase activity slowly   Complete by: As directed      Allergies as of 03/20/2020      Reactions   Dyazide [hydrochlorothiazide W-triamterene] Other (See Comments)   Unknown reaction   Toprol Xl [metoprolol Tartrate] Other (See Comments)   unknown   Monopril [fosinopril] Cough   Verapamil Other (See Comments)   headache      Medication List    TAKE these medications   aspirin 81 MG EC tablet Take 1 tablet (81 mg total) by mouth daily.   calcium carbonate 500 MG chewable tablet Commonly known as: TUMS - dosed in mg elemental calcium Chew 1 tablet (200 mg of elemental calcium total) by mouth 3 (three) times daily as needed for indigestion or heartburn.   cefdinir 300 MG capsule Commonly known as: OMNICEF Take 1 capsule (300 mg total) by mouth 2 (two) times daily for 5 days.   escitalopram 10 MG tablet Commonly known as: LEXAPRO TAKE 1/2 TABLET BY MOUTH DAILY   furosemide  40 MG tablet Commonly known as: Lasix Take 0.5 tablets (20 mg total) by mouth daily.   potassium chloride 20 MEQ/15ML (10%) Soln Take 15 mLs (20 mEq total) by mouth daily.   sacubitril-valsartan 49-51 MG Commonly known as: ENTRESTO Take 1 tablet by mouth 2 (two) times daily. What changed: how much to take       Allergies  Allergen Reactions  . Dyazide [Hydrochlorothiazide W-Triamterene]  Other (See Comments)    Unknown reaction  . Toprol Xl [Metoprolol Tartrate] Other (See Comments)    unknown  . Monopril [Fosinopril] Cough  . Verapamil Other (See Comments)    headache    Consultations:  Cardiology  Procedures/Studies: DG Chest 2 View  Result Date: 03/19/2020 CLINICAL DATA:  Confusion, fall EXAM: CHEST - 2 VIEW COMPARISON:  11/04/2019, 05/17/2019 FINDINGS: Heart size appears mildly enlarged. Densely calcified, tortuous aorta with a similar degree of ectasias a within the ascending aorta. Hyperinflated lungs with coarsened interstitial markings bilaterally. No focal airspace consolidation, pleural effusion, or pneumothorax. Persistent prominence of the soft tissues in the right supraclavicular region, which has been previously evaluated and biopsied. Osseous structures appear demineralized. Advanced degenerative changes throughout the thoracic spine. No acute osseous abnormality is identified. IMPRESSION: 1. COPD without acute cardiopulmonary abnormality. 2. Densely calcified, tortuous aorta. Electronically Signed   By: Davina Poke D.O.   On: 03/19/2020 13:29   CT Head Wo Contrast  Result Date: 03/19/2020 CLINICAL DATA:  Head trauma, minor. Additional history provided: Fall, mild headache. EXAM: CT HEAD WITHOUT CONTRAST CT CERVICAL SPINE WITHOUT CONTRAST TECHNIQUE: Multidetector CT imaging of the head and cervical spine was performed following the standard protocol without intravenous contrast. Multiplanar CT image reconstructions of the cervical spine were also generated. COMPARISON:  Brain MRI 05/18/2019. Head CT 05/18/2019. Neck CT 12/14/2013. FINDINGS: CT HEAD FINDINGS Brain: Mild generalized cerebral atrophy. Moderate/advanced ill-defined hypoattenuation within the cerebral white matter is nonspecific, but compatible with chronic small vessel ischemic disease. There is no acute intracranial hemorrhage. No demarcated cortical infarct. No extra-axial fluid collection. No  evidence of intracranial mass. No midline shift. Vascular: No hyperdense vessel.  Atherosclerotic calcifications. Skull: No calvarial fracture. 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 Sinuses/Orbits: Visualized orbits show no acute finding. Mild ethmoid sinus mucosal thickening. Small left sphenoid sinus mucous retention cyst. CT CERVICAL SPINE FINDINGS Alignment: Trace C7-T1 grade 1 anterolisthesis. Skull base and vertebrae: The basion-dental and atlanto-dental intervals are maintained.No evidence of acute fracture to the cervical spine. Soft tissues and spinal canal: No prevertebral fluid or swelling. No visible canal hematoma. Disc levels: Cervical spondylosis. Most notably at C5-C6, there is severe disc degeneration with degenerative endplate irregularity and sclerosis, a posterior disc osteophyte complex and uncovertebral hypertrophy. Upper chest: No consolidation within the imaged lung apices. No visible pneumothorax. Other: Right supraclavicular mass measuring 4.1 x 3.2 x 3.9 cm. IMPRESSION: CT head: 1. No evidence of acute intracranial abnormality. 2. Mild cerebral atrophy with moderate/advanced chronic small vessel ischemic disease, stable as compared to the brain MRI of 05/18/2019. 3. 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 CT cervical spine: 1. No evidence of acute fracture to the cervical spine. 2. Mild C7-T1 grade 1 anterolisthesis. 3. Cervical spondylosis, greatest at C5-C6. 4. 4.1 cm right supraclavicular mass increased in size since the prior neck CT of 12/14/2013 (measuring 3.3 cm in greatest dimension at that time). Review of prior  electronic medical records shows that this mass was biopsied in 2015 and pathology was consistent with a schwannoma. Electronically Signed   By: Kellie Simmering DO    On: 03/19/2020 13:12   CT Cervical Spine Wo Contrast  Result Date: 03/19/2020 CLINICAL DATA:  Head trauma, minor. Additional history provided: Fall, mild headache. EXAM: CT HEAD WITHOUT CONTRAST CT CERVICAL SPINE WITHOUT CONTRAST TECHNIQUE: Multidetector CT imaging of the head and cervical spine was performed following the standard protocol without intravenous contrast. Multiplanar CT image reconstructions of the cervical spine were also generated. COMPARISON:  Brain MRI 05/18/2019. Head CT 05/18/2019. Neck CT 12/14/2013. FINDINGS: CT HEAD FINDINGS Brain: Mild generalized cerebral atrophy. Moderate/advanced ill-defined hypoattenuation within the cerebral white matter is nonspecific, but compatible with chronic small vessel ischemic disease. There is no acute intracranial hemorrhage. No demarcated cortical infarct. No extra-axial fluid collection. No evidence of intracranial mass. No midline shift. Vascular: No hyperdense vessel.  Atherosclerotic calcifications. Skull: No calvarial fracture. 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 Sinuses/Orbits: Visualized orbits show no acute finding. Mild ethmoid sinus mucosal thickening. Small left sphenoid sinus mucous retention cyst. CT CERVICAL SPINE FINDINGS Alignment: Trace C7-T1 grade 1 anterolisthesis. Skull base and vertebrae: The basion-dental and atlanto-dental intervals are maintained.No evidence of acute fracture to the cervical spine. Soft tissues and spinal canal: No prevertebral fluid or swelling. No visible canal hematoma. Disc levels: Cervical spondylosis. Most notably at C5-C6, there is severe disc degeneration with degenerative endplate irregularity and sclerosis, a posterior disc osteophyte complex and uncovertebral hypertrophy. Upper chest: No consolidation within the imaged lung apices. No visible pneumothorax. Other: Right supraclavicular mass  measuring 4.1 x 3.2 x 3.9 cm. IMPRESSION: CT head: 1. No evidence of acute intracranial abnormality. 2. Mild cerebral atrophy with moderate/advanced chronic small vessel ischemic disease, stable as compared to the brain MRI of 05/18/2019. 3. 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 CT cervical spine: 1. No evidence of acute fracture to the cervical spine. 2. Mild C7-T1 grade 1 anterolisthesis. 3. Cervical spondylosis, greatest at C5-C6. 4. 4.1 cm right supraclavicular mass increased in size since the prior neck CT of 12/14/2013 (measuring 3.3 cm in greatest dimension at that time). Review of prior electronic medical records shows that this mass was biopsied in 2015 and pathology was consistent with a schwannoma. Electronically Signed   By: Kellie Simmering DO   On: 03/19/2020 13:12   ECHOCARDIOGRAM COMPLETE  Result Date: 03/20/2020    ECHOCARDIOGRAM REPORT   Patient Name:   Taylor Watson Date of Exam: 03/19/2020 Medical Rec #:  [id number]          Height:       63.0 in Accession #:    [id number]         Weight:       113.0 lb Date of Birth:  1918/09/25          BSA:          1.517 m Patient Age:    86 years           BP:           119/61 mmHg Patient Gender: F                  HR:           71 bpm. Exam Location:  [facility] Procedure: 2D Echo, Color Doppler  and Cardiac Doppler Indications:     Elevated troponin  History:         Patient has prior history of Echocardiogram examinations, most                  recent 07/19/2019. Risk Factors:Hypertension and HCL.  Sonographer:     Charmayne Sheer RDCS (AE) Referring Phys:  Baker Janus Soledad Gerlach NIU Diagnosing Phys: Kate Sable MD  Sonographer Comments: No parasternal window and suboptimal apical window. Image acquisition challenging due to patient body habitus. IMPRESSIONS  1. Left ventricular ejection fraction, by estimation, is 60 to 65%. The left ventricle has normal  function. The left ventricle has no regional wall motion abnormalities. There is mild left ventricular hypertrophy. Left ventricular diastolic parameters are consistent with Grade I diastolic dysfunction (impaired relaxation).  2. Right ventricular systolic function is normal. The right ventricular size is normal. There is mildly elevated pulmonary artery systolic pressure.  3. The mitral valve is degenerative. No evidence of mitral valve regurgitation. Mild mitral stenosis.  4. The aortic valve was not well visualized. Aortic valve regurgitation is not visualized.  5. Pulmonic valve regurgitation not assessed.  6. The inferior vena cava is normal in size with greater than 50% respiratory variability, suggesting right atrial pressure of 3 mmHg. FINDINGS  Left Ventricle: Left ventricular ejection fraction, by estimation, is 60 to 65%. The left ventricle has normal function. The left ventricle has no regional wall motion abnormalities. The left ventricular internal cavity size was normal in size. There is  mild left ventricular hypertrophy. Left ventricular diastolic parameters are consistent with Grade I diastolic dysfunction (impaired relaxation). Right Ventricle: The right ventricular size is normal. Right vetricular wall thickness was not well visualized. Right ventricular systolic function is normal. There is mildly elevated pulmonary artery systolic pressure. The tricuspid regurgitant velocity  is 2.88 m/s, and with an assumed right atrial pressure of 3 mmHg, the estimated right ventricular systolic pressure is 16.6 mmHg. Left Atrium: Left atrial size was normal in size. Right Atrium: Right atrial size was normal in size. Pericardium: There is no evidence of pericardial effusion. Mitral Valve: The mitral valve is degenerative in appearance. Mild mitral annular calcification. No evidence of mitral valve regurgitation. Mild mitral valve stenosis. MV peak gradient, 14.3 mmHg. The mean mitral valve gradient is 5.0  mmHg. Tricuspid Valve: The tricuspid valve is normal in structure. Tricuspid valve regurgitation is mild. Aortic Valve: The aortic valve was not well visualized. Aortic valve regurgitation is not visualized. Aortic valve mean gradient measures 6.0 mmHg. Aortic valve peak gradient measures 10.0 mmHg. Aortic valve area, by VTI measures 1.75 cm. Pulmonic Valve: The pulmonic valve was not well visualized. Pulmonic valve regurgitation not assessed. Aorta: The aortic root was not well visualized. Venous: The inferior vena cava is normal in size with greater than 50% respiratory variability, suggesting right atrial pressure of 3 mmHg. IAS/Shunts: No atrial level shunt detected by color flow Doppler.  LEFT VENTRICLE PLAX 2D LVOT diam:     1.80 cm     Diastology LV SV:         48          LV e' medial:    5.11 cm/s LV SV Index:   31          LV E/e' medial:  17.3 LVOT Area:     2.54 cm    LV e' lateral:   5.77 cm/s  LV E/e' lateral: 15.4  LV Volumes (MOD) LV vol d, MOD A2C: 31.4 ml LV vol d, MOD A4C: 40.6 ml LV vol s, MOD A2C: 11.6 ml LV vol s, MOD A4C: 11.5 ml LV SV MOD A2C:     19.8 ml LV SV MOD A4C:     40.6 ml LV SV MOD BP:      21.6 ml RIGHT VENTRICLE RV Basal diam:  2.59 cm LEFT ATRIUM             Index       RIGHT ATRIUM          Index LA Vol (A2C):   49.4 ml 32.56 ml/m RA Area:     8.05 cm LA Vol (A4C):   16.8 ml 11.07 ml/m RA Volume:   12.70 ml 8.37 ml/m LA Biplane Vol: 28.9 ml 19.05 ml/m  AORTIC VALVE AV Area (Vmax):    1.71 cm AV Area (Vmean):   1.47 cm AV Area (VTI):     1.75 cm AV Vmax:           158.00 cm/s AV Vmean:          120.000 cm/s AV VTI:            0.272 m AV Peak Grad:      10.0 mmHg AV Mean Grad:      6.0 mmHg LVOT Vmax:         106.00 cm/s LVOT Vmean:        69.400 cm/s LVOT VTI:          0.187 m LVOT/AV VTI ratio: 0.69  AORTA Ao Root diam: 2.50 cm MITRAL VALVE                TRICUSPID VALVE MV Area (PHT): 2.40 cm     TR Peak grad:   33.2 mmHg MV Peak grad:  14.3  mmHg    TR Vmax:        288.00 cm/s MV Mean grad:  5.0 mmHg MV Vmax:       1.89 m/s     SHUNTS MV Vmean:      103.0 cm/s   Systemic VTI:  0.19 m MV Decel Time: 316 msec     Systemic Diam: 1.80 cm MV E velocity: 88.60 cm/s MV A velocity: 174.00 cm/s MV E/A ratio:  0.51 Kate Sable MD Electronically signed by Kate Sable MD Signature Date/Time: 03/20/2020/8:45:21 AM    Final      Subjective: Patient is a pleasant elderly frail lady, very hard of hearing.  Denies any chest pain or shortness of breath.  Denies any other complaint and does not know why she is here.  Discharge Exam: Vitals:   03/20/20 0758 03/20/20 1108  BP: 137/60 (!) 154/69  Pulse: 64 73  Resp: 17 16  Temp: 98.5 F (36.9 C) 98.5 F (36.9 C)  SpO2: 100% 99%   Vitals:   03/20/20 0400 03/20/20 0518 03/20/20 0758 03/20/20 1108  BP: (!) 131/49 (!) 124/49 137/60 (!) 154/69  Pulse: 93  64 73  Resp: 18 19 17 16   Temp: 98.3 F (36.8 C)  98.5 F (36.9 C) 98.5 F (36.9 C)  TempSrc: Oral  Oral Oral  SpO2: 100%  100% 99%  Weight: 63.5 kg     Height:        General: Pt is alert, awake, not in acute distress Cardiovascular: RRR, S1/S2 +, no rubs, no gallops Respiratory: CTA bilaterally, no wheezing, no rhonchi  Abdominal: Soft, NT, ND, bowel sounds + Extremities: no edema, no cyanosis   The results of significant diagnostics from this hospitalization (including imaging, microbiology, ancillary and laboratory) are listed below for reference.    Microbiology: Recent Results (from the past 240 hour(s))  Respiratory Panel by RT PCR (Flu A&B, Covid) - Nasopharyngeal Swab     Status: None   Collection Time: 03/19/20  1:41 PM   Specimen: Nasopharyngeal Swab  Result Value Ref Range Status   SARS Coronavirus 2 by RT PCR NEGATIVE NEGATIVE Final    Comment: (NOTE) SARS-CoV-2 target nucleic acids are NOT DETECTED.  The SARS-CoV-2 RNA is generally detectable in upper respiratoy specimens during the acute phase of  infection. The lowest concentration of SARS-CoV-2 viral copies this assay can detect is 131 copies/mL. A negative result does not preclude SARS-Cov-2 infection and should not be used as the sole basis for treatment or other patient management decisions. A negative result may occur with  improper specimen collection/handling, submission of specimen other than nasopharyngeal swab, presence of viral mutation(s) within the areas targeted by this assay, and inadequate number of viral copies (<131 copies/mL). A negative result must be combined with clinical observations, patient history, and epidemiological information. The expected result is Negative.  Fact Sheet for Patients:  PinkCheek.be  Fact Sheet for Healthcare Providers:  GravelBags.it  This test is no t yet approved or cleared by the [place] FDA and  has been authorized for detection and/or diagnosis of SARS-CoV-2 by FDA under an Emergency Use Authorization (EUA). This EUA will remain  in effect (meaning this test can be used) for the duration of the COVID-19 declaration under Section 564(b)(1) of the Act, 21 U.S.C. section 360bbb-3(b)(1), unless the authorization is terminated or revoked sooner.     Influenza A by PCR NEGATIVE NEGATIVE Final   Influenza B by PCR NEGATIVE NEGATIVE Final    Comment: (NOTE) The Xpert Xpress SARS-CoV-2/FLU/RSV assay is intended as an aid in  the diagnosis of influenza from Nasopharyngeal swab specimens and  should not be used as a sole basis for treatment. Nasal washings and  aspirates are unacceptable for Xpert Xpress SARS-CoV-2/FLU/RSV  testing.  Fact Sheet for Patients: PinkCheek.be  Fact Sheet for Healthcare Providers: GravelBags.it  This test is not yet approved or cleared by the [place] FDA and  has been authorized for detection and/or diagnosis of SARS-CoV-2  by  FDA under an Emergency Use Authorization (EUA). This EUA will remain  in effect (meaning this test can be used) for the duration of the  Covid-19 declaration under Section 564(b)(1) of the Act, 21  U.S.C. section 360bbb-3(b)(1), unless the authorization is  terminated or revoked. Performed at [facility], [address] [place], [place] [address]   Blood culture (routine single)     Status: None (Preliminary result)   Collection Time: 03/19/20  2:55 PM   Specimen: BLOOD  Result Value Ref Range Status   Specimen Description BLOOD BLOOD LEFT FOREARM  Final   Special Requests   Final    BOTTLES DRAWN AEROBIC AND ANAEROBIC Blood Culture results may not be optimal due to an inadequate volume of blood received in culture bottles   Culture   Final    NO GROWTH < 24 HOURS Performed at [facility], [address], [place], [place] [address]    Report Status PENDING  Incomplete     Labs: BNP (last 3 results) Recent Labs    07/25/19 0557 11/04/19 1614 03/19/20  1455  BNP 220.0* 393.4* 170.0*   Basic Metabolic Panel: Recent Labs  Lab 03/19/20 1236 03/20/20 0648  NA 137 142  K 4.2 4.0  CL 94* 98  CO2 32 33*  GLUCOSE 89 78  BUN 27* 21  CREATININE 0.98 0.95  CALCIUM 9.8 9.7  MG  --  2.1   Liver Function Tests: No results for input(s): AST, ALT, ALKPHOS, BILITOT, PROT, ALBUMIN in the last 168 hours. No results for input(s): LIPASE, AMYLASE in the last 168 hours. No results for input(s): AMMONIA in the last 168 hours. CBC: Recent Labs  Lab 03/19/20 1236 03/20/20 0648  WBC 5.2 3.6*  NEUTROABS 3.7  --   HGB 12.3 13.5  HCT 38.4 42.1  MCV 91.4 92.9  PLT 221 198   Cardiac Enzymes: No results for input(s): CKTOTAL, CKMB, CKMBINDEX, TROPONINI in the last 168 hours. BNP: Invalid input(s): POCBNP CBG: No results for input(s): GLUCAP in the last 168 hours. D-Dimer No results for input(s): DDIMER in the last 72 hours. Hgb A1c Recent Labs     03/20/20 0648  HGBA1C 5.7*   Lipid Profile Recent Labs    03/20/20 0648  CHOL 247*  HDL 66  LDLCALC 165*  TRIG 81  CHOLHDL 3.7   Thyroid function studies No results for input(s): TSH, T4TOTAL, T3FREE, THYROIDAB in the last 72 hours.  Invalid input(s): FREET3 Anemia work up No results for input(s): VITAMINB12, FOLATE, FERRITIN, TIBC, IRON, RETICCTPCT in the last 72 hours. Urinalysis    Component Value Date/Time   COLORURINE STRAW (A) 03/19/2020 1341   APPEARANCEUR CLEAR (A) 03/19/2020 1341   APPEARANCEUR Clear 09/21/2011 0059   LABSPEC 1.008 03/19/2020 1341   LABSPEC 1.005 09/21/2011 0059   PHURINE 6.0 03/19/2020 1341   GLUCOSEU NEGATIVE 03/19/2020 1341   GLUCOSEU Negative 09/21/2011 0059   HGBUR SMALL (A) 03/19/2020 1341   BILIRUBINUR NEGATIVE 03/19/2020 1341   BILIRUBINUR Negative 09/21/2011 0059   KETONESUR NEGATIVE 03/19/2020 1341   PROTEINUR NEGATIVE 03/19/2020 1341   NITRITE NEGATIVE 03/19/2020 1341   LEUKOCYTESUR TRACE (A) 03/19/2020 1341   LEUKOCYTESUR 1+ 09/21/2011 0059   Sepsis Labs Invalid input(s): PROCALCITONIN,  WBC,  LACTICIDVEN Microbiology Recent Results (from the past 240 hour(s))  Respiratory Panel by RT PCR (Flu A&B, Covid) - Nasopharyngeal Swab     Status: None   Collection Time: 03/19/20  1:41 PM   Specimen: Nasopharyngeal Swab  Result Value Ref Range Status   SARS Coronavirus 2 by RT PCR NEGATIVE NEGATIVE Final    Comment: (NOTE) SARS-CoV-2 target nucleic acids are NOT DETECTED.  The SARS-CoV-2 RNA is generally detectable in upper respiratoy specimens during the acute phase of infection. The lowest concentration of SARS-CoV-2 viral copies this assay can detect is 131 copies/mL. A negative result does not preclude SARS-Cov-2 infection and should not be used as the sole basis for treatment or other patient management decisions. A negative result may occur with  improper specimen collection/handling, submission of specimen other than  nasopharyngeal swab, presence of viral mutation(s) within the areas targeted by this assay, and inadequate number of viral copies (<131 copies/mL). A negative result must be combined with clinical observations, patient history, and epidemiological information. The expected result is Negative.  Fact Sheet for Patients:  PinkCheek.be  Fact Sheet for Healthcare Providers:  GravelBags.it  This test is no t yet approved or cleared by the [place] FDA and  has been authorized for detection and/or diagnosis of SARS-CoV-2 by FDA under an Emergency Use Authorization (  EUA). This EUA will remain  in effect (meaning this test can be used) for the duration of the COVID-19 declaration under Section 564(b)(1) of the Act, 21 U.S.C. section 360bbb-3(b)(1), unless the authorization is terminated or revoked sooner.     Influenza A by PCR NEGATIVE NEGATIVE Final   Influenza B by PCR NEGATIVE NEGATIVE Final    Comment: (NOTE) The Xpert Xpress SARS-CoV-2/FLU/RSV assay is intended as an aid in  the diagnosis of influenza from Nasopharyngeal swab specimens and  should not be used as a sole basis for treatment. Nasal washings and  aspirates are unacceptable for Xpert Xpress SARS-CoV-2/FLU/RSV  testing.  Fact Sheet for Patients: PinkCheek.be  Fact Sheet for Healthcare Providers: GravelBags.it  This test is not yet approved or cleared by the [place] FDA and  has been authorized for detection and/or diagnosis of SARS-CoV-2 by  FDA under an Emergency Use Authorization (EUA). This EUA will remain  in effect (meaning this test can be used) for the duration of the  Covid-19 declaration under Section 564(b)(1) of the Act, 21  U.S.C. section 360bbb-3(b)(1), unless the authorization is  terminated or revoked. Performed at [facility], [address] [place], [place]  [address]   Blood culture (routine single)     Status: None (Preliminary result)   Collection Time: 03/19/20  2:55 PM   Specimen: BLOOD  Result Value Ref Range Status   Specimen Description BLOOD BLOOD LEFT FOREARM  Final   Special Requests   Final    BOTTLES DRAWN AEROBIC AND ANAEROBIC Blood Culture results may not be optimal due to an inadequate volume of blood received in culture bottles   Culture   Final    NO GROWTH < 24 HOURS Performed at [facility], [address], [place], [place] [address]    Report Status PENDING  Incomplete    Time coordinating discharge: Over 30 minutes  SIGNED:  Lorella Nimrod, MD  [facility] 03/20/2020, 2:31 PM  If 7PM-7AM, please contact night-coverage [email or web address]  This record has been created using Systems analyst. Errors have been sought and corrected,but may not always be located. Such creation errors do not reflect on the standard of care.

## 2020-03-23 LAB — URINE CULTURE: Culture: >=100000 — AB

## 2020-03-24 ENCOUNTER — Telehealth: Payer: Self-pay | Admitting: Internal Medicine

## 2020-03-24 LAB — CULTURE, BLOOD (SINGLE): Culture: NO GROWTH

## 2020-03-24 NOTE — Telephone Encounter (Signed)
DME printed for signature 

## 2020-03-24 NOTE — Telephone Encounter (Signed)
Spoke with daughter. Daughter advised that it is not a wound. It is a boil about the size of a nickel that opened up. Daughter stated it is starting to dry up. Pt stated that a nurse in the hospital told her it needed stitches. Daughter wants Dr Nicki Reaper to look at it. Confirmed that it is not actively draining or bleeding. Scheduled patient for in office appt tomorrow to see Dr Nicki Reaper. Confirmed pt doing ok since being discharged.

## 2020-03-24 NOTE — Telephone Encounter (Signed)
Taylor Watson called for patient a hosp f/u stated that patient has wound on her stomach that need to be stitched up left hospital like this that told her to follow up with pcp no appointments available Wanted to know what she should do she was discharged last week.

## 2020-03-25 ENCOUNTER — Ambulatory Visit (INDEPENDENT_AMBULATORY_CARE_PROVIDER_SITE_OTHER): Payer: Medicare Other | Admitting: Internal Medicine

## 2020-03-25 ENCOUNTER — Other Ambulatory Visit: Payer: Self-pay

## 2020-03-25 ENCOUNTER — Encounter: Payer: Self-pay | Admitting: Internal Medicine

## 2020-03-25 VITALS — BP 132/62 | HR 49 | Temp 98.0°F | Resp 16 | Ht 64.0 in | Wt 107.0 lb

## 2020-03-25 DIAGNOSIS — I5032 Chronic diastolic (congestive) heart failure: Secondary | ICD-10-CM

## 2020-03-25 DIAGNOSIS — L723 Sebaceous cyst: Secondary | ICD-10-CM

## 2020-03-25 DIAGNOSIS — E876 Hypokalemia: Secondary | ICD-10-CM | POA: Diagnosis not present

## 2020-03-25 DIAGNOSIS — F419 Anxiety disorder, unspecified: Secondary | ICD-10-CM | POA: Diagnosis not present

## 2020-03-25 DIAGNOSIS — R001 Bradycardia, unspecified: Secondary | ICD-10-CM

## 2020-03-25 DIAGNOSIS — I10 Essential (primary) hypertension: Secondary | ICD-10-CM | POA: Diagnosis not present

## 2020-03-25 DIAGNOSIS — D649 Anemia, unspecified: Secondary | ICD-10-CM | POA: Diagnosis not present

## 2020-03-25 DIAGNOSIS — D3610 Benign neoplasm of peripheral nerves and autonomic nervous system, unspecified: Secondary | ICD-10-CM

## 2020-03-25 DIAGNOSIS — R531 Weakness: Secondary | ICD-10-CM

## 2020-03-25 DIAGNOSIS — N3000 Acute cystitis without hematuria: Secondary | ICD-10-CM

## 2020-03-25 LAB — BASIC METABOLIC PANEL
BUN: 15 mg/dL (ref 6–23)
CO2: 31 mEq/L (ref 19–32)
Calcium: 9.7 mg/dL (ref 8.4–10.5)
Chloride: 96 mEq/L (ref 96–112)
Creatinine, Ser: 0.9 mg/dL (ref 0.40–1.20)
GFR: 52.93 mL/min — ABNORMAL LOW (ref >60.00)
Glucose, Bld: 93 mg/dL (ref 70–99)
Potassium: 4 mEq/L (ref 3.5–5.1)
Sodium: 134 mEq/L — ABNORMAL LOW (ref 135–145)

## 2020-03-25 LAB — CBC WITH DIFFERENTIAL/PLATELET
Basophils Absolute: 0 10*3/uL (ref 0.0–0.1)
Basophils Relative: 1.4 % (ref 0.0–3.0)
Eosinophils Absolute: 0.2 10*3/uL (ref 0.0–0.7)
Eosinophils Relative: 4.9 % (ref 0.0–5.0)
HCT: 39 % (ref 36.0–46.0)
Hemoglobin: 12.8 g/dL (ref 12.0–15.0)
Lymphocytes Relative: 22.2 % (ref 12.0–46.0)
Lymphs Abs: 0.7 10*3/uL (ref 0.7–4.0)
MCHC: 33 g/dL (ref 30.0–36.0)
MCV: 89.2 fl (ref 78.0–100.0)
Monocytes Absolute: 0.4 10*3/uL (ref 0.1–1.0)
Monocytes Relative: 14.4 % — ABNORMAL HIGH (ref 3.0–12.0)
Neutro Abs: 1.8 10*3/uL (ref 1.4–7.7)
Neutrophils Relative %: 57.1 % (ref 43.0–77.0)
Platelets: 262 10*3/uL (ref 150.0–400.0)
RBC: 4.37 Mil/uL (ref 3.87–5.11)
RDW: 14.9 % (ref 11.5–15.5)
WBC: 3.1 10*3/uL — ABNORMAL LOW (ref 4.0–10.5)

## 2020-03-25 NOTE — Progress Notes (Signed)
Subjective:    Patient ID: Taylor Watson, female    DOB: July 31, 1918, 86 y.o.   MRN: [id number]  HPI This visit occurred during the SARS-CoV-2 public health emergency.  Safety protocols were in place, including screening questions prior to the visit, additional usage of staff PPE, and extensive cleaning of exam room while observing appropriate contact time as indicated for disinfecting solutions.  Patient here for scheduled hospital follow up.  Admitted 03/19/20 - 03/20/20 with AMS and UTI.  Was treated with ceftriaxone while in the hospital and discharged on omnicef.  Also had elevated troponin - peaked at 499.  ECHO - normal EF with on wall motion abnormalities.  Cardiology consulted and recommend medical management.  Since her discharge, states things have been relatively stable.  rx given for lift chair.  Still with some weakness, but able to get around with assistance.  No chest pain.  Breathing stable.  Eating.  No vomiting or abdominal pain reported.  Bowels stable.  Taking her medication.  Mental status seems back to baseline.  Discussed home health and therapy.  Has an open lesion - lower abdomen. Daughter states opened and drained some.  No pain.  No surrounding erythema.   Past Medical History:  Diagnosis Date  . Allergy   . Anemia   . Arrhythmia   . CHF (congestive heart failure) ([place])   . CVA (cerebral vascular accident) ([place])   . Depression   . GERD (gastroesophageal reflux disease)   . History of blood transfusion   . History of kidney stones   . Hx: UTI (urinary tract infection)   . Hypercholesterolemia   . Hypertension    Past Surgical History:  Procedure Laterality Date  . CHOLECYSTECTOMY  1984  . NECK LESION BIOPSY  2015   Followed by Dr. Richardson Landry   Family History  Problem Relation Age of Onset  . Cancer Mother        unknown type  . Hypertension Mother   . Cancer Father        unknown type  . Cancer Daughter   . Colon cancer Other        grandfather    Social History   Socioeconomic History  . Marital status: Widowed    Spouse name: Not on file  . Number of children: 5  . Years of education: Not on file  . Highest education level: Not on file  Occupational History  . Not on file  Tobacco Use  . Smoking status: Former Research scientist (life sciences)  . Smokeless tobacco: Former Systems developer    Types: Chew  Substance and Sexual Activity  . Alcohol use: No    Alcohol/week: 0.0 standard drinks  . Drug use: No  . Sexual activity: Not Currently  Other Topics Concern  . Not on file  Social History Narrative  . Not on file   Social Determinants of Health   Financial Resource Strain:   . Difficulty of Paying Living Expenses: Not on file  Food Insecurity:   . Worried About Charity fundraiser in the Last Year: Not on file  . Ran Out of Food in the Last Year: Not on file  Transportation Needs:   . Lack of Transportation (Medical): Not on file  . Lack of Transportation (Non-Medical): Not on file  Physical Activity:   . Days of Exercise per Week: Not on file  . Minutes of Exercise per Session: Not on file  Stress:   . Feeling of Stress : Not on  file  Social Connections:   . Frequency of Communication with Friends and Family: Not on file  . Frequency of Social Gatherings with Friends and Family: Not on file  . Attends Religious Services: Not on file  . Active Member of Clubs or Organizations: Not on file  . Attends Archivist Meetings: Not on file  . Marital Status: Not on file    Outpatient Encounter Medications as of 03/25/2020  Medication Sig  . aspirin EC 81 MG EC tablet Take 1 tablet (81 mg total) by mouth daily.  . calcium carbonate (TUMS - DOSED IN MG ELEMENTAL CALCIUM) 500 MG chewable tablet Chew 1 tablet (200 mg of elemental calcium total) by mouth 3 (three) times daily as needed for indigestion or heartburn.  . [EXPIRED] cefdinir (OMNICEF) 300 MG capsule Take 1 capsule (300 mg total) by mouth 2 (two) times daily for 5 days.  Marland Kitchen  escitalopram (LEXAPRO) 10 MG tablet TAKE 1/2 TABLET BY MOUTH DAILY  . furosemide (LASIX) 40 MG tablet Take 0.5 tablets (20 mg total) by mouth daily.  . sacubitril-valsartan (ENTRESTO) 49-51 MG Take 1 tablet by mouth 2 (two) times daily. (Patient taking differently: Take 0.5 tablets by mouth 2 (two) times daily. )  . potassium chloride 20 MEQ/15ML (10%) SOLN Take 15 mLs (20 mEq total) by mouth daily. (Patient not taking: Reported on 03/25/2020)   No facility-administered encounter medications on file as of 03/25/2020.    Review of Systems  Constitutional: Negative for appetite change and unexpected weight change.  HENT: Negative for congestion and sinus pressure.   Respiratory: Negative for cough and chest tightness.        Breathing stable.   Cardiovascular: Negative for chest pain and palpitations.       No increased leg swelling.   Gastrointestinal: Negative for abdominal pain, diarrhea, nausea and vomiting.  Genitourinary: Negative for difficulty urinating and dysuria.  Musculoskeletal: Negative for joint swelling and myalgias.  Skin: Negative for color change and rash.       Open lesion - lower abdomen.   Neurological: Negative for dizziness, light-headedness and headaches.  Psychiatric/Behavioral: Negative for agitation and dysphoric mood.       Objective:    Physical Exam Vitals reviewed.  Constitutional:      General: She is not in acute distress.    Appearance: Normal appearance.  HENT:     Head: Normocephalic and atraumatic.     Right Ear: External ear normal.     Left Ear: External ear normal.  Eyes:     General: No scleral icterus.       Right eye: No discharge.        Left eye: No discharge.     Conjunctiva/sclera: Conjunctivae normal.  Neck:     Thyroid: No thyromegaly.  Cardiovascular:     Rate and Rhythm: Normal rate and regular rhythm.  Pulmonary:     Effort: No respiratory distress.     Breath sounds: Normal breath sounds. No wheezing.  Abdominal:      General: Bowel sounds are normal.     Palpations: Abdomen is soft.     Tenderness: There is no abdominal tenderness.  Musculoskeletal:        General: No tenderness.     Cervical back: Neck supple. No tenderness.  Lymphadenopathy:     Cervical: No cervical adenopathy.  Skin:    Findings: No erythema or rash.     Comments: Sebaceous cyst - lower abdomen.  Open.  Sebaceous  material removed.  Cleaned and dressed.    Neurological:     Mental Status: She is alert.  Psychiatric:        Mood and Affect: Mood normal.        Behavior: Behavior normal.     BP 132/62 (BP Location: Left Arm, Patient Position: Sitting, Cuff Size: Normal)   Pulse (!) 49   Temp 98 F (36.7 C) (Oral)   Resp 16   Ht 5\' 4"  (1.626 m)   Wt 107 lb (48.5 kg)   SpO2 97%   BMI 18.37 kg/m  Wt Readings from Last 3 Encounters:  03/25/20 107 lb (48.5 kg)  03/20/20 140 lb (63.5 kg)  01/28/20 100 lb 2 oz (45.4 kg)     Lab Results  Component Value Date   WBC 3.1 (L) 03/25/2020   HGB 12.8 03/25/2020   HCT 39.0 03/25/2020   PLT 262.0 03/25/2020   GLUCOSE 93 03/25/2020   CHOL 247 (H) 03/20/2020   TRIG 81 03/20/2020   HDL 66 03/20/2020   LDLDIRECT 163.5 01/12/2013   LDLCALC 165 (H) 03/20/2020   ALT 9 01/10/2020   AST 14 01/10/2020   NA 134 (L) 03/25/2020   K 4.0 03/25/2020   CL 96 03/25/2020   CREATININE 0.90 03/25/2020   BUN 15 03/25/2020   CO2 31 03/25/2020   TSH 3.35 01/10/2020   INR 1.0 03/19/2020   HGBA1C 5.7 (H) 03/20/2020    DG Chest 2 View  Result Date: 03/19/2020 CLINICAL DATA:  Confusion, fall EXAM: CHEST - 2 VIEW COMPARISON:  11/04/2019, 05/17/2019 FINDINGS: Heart size appears mildly enlarged. Densely calcified, tortuous aorta with a similar degree of ectasias a within the ascending aorta. Hyperinflated lungs with coarsened interstitial markings bilaterally. No focal airspace consolidation, pleural effusion, or pneumothorax. Persistent prominence of the soft tissues in the right  supraclavicular region, which has been previously evaluated and biopsied. Osseous structures appear demineralized. Advanced degenerative changes throughout the thoracic spine. No acute osseous abnormality is identified. IMPRESSION: 1. COPD without acute cardiopulmonary abnormality. 2. Densely calcified, tortuous aorta. Electronically Signed   By: Davina Poke D.O.   On: 03/19/2020 13:29   CT Head Wo Contrast  Result Date: 03/19/2020 CLINICAL DATA:  Head trauma, minor. Additional history provided: Fall, mild headache. EXAM: CT HEAD WITHOUT CONTRAST CT CERVICAL SPINE WITHOUT CONTRAST TECHNIQUE: Multidetector CT imaging of the head and cervical spine was performed following the standard protocol without intravenous contrast. Multiplanar CT image reconstructions of the cervical spine were also generated. COMPARISON:  Brain MRI 05/18/2019. Head CT 05/18/2019. Neck CT 12/14/2013. FINDINGS: CT HEAD FINDINGS Brain: Mild generalized cerebral atrophy. Moderate/advanced ill-defined hypoattenuation within the cerebral white matter is nonspecific, but compatible with chronic small vessel ischemic disease. There is no acute intracranial hemorrhage. No demarcated cortical infarct. No extra-axial fluid collection. No evidence of intracranial mass. No midline shift. Vascular: No hyperdense vessel.  Atherosclerotic calcifications. Skull: No calvarial fracture. 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 Sinuses/Orbits: Visualized orbits show no acute finding. Mild ethmoid sinus mucosal thickening. Small left sphenoid sinus mucous retention cyst. CT CERVICAL SPINE FINDINGS Alignment: Trace C7-T1 grade 1 anterolisthesis. Skull base and vertebrae: The basion-dental and atlanto-dental intervals are maintained.No evidence of acute fracture to the cervical spine. Soft tissues and spinal canal: No prevertebral fluid or swelling. No  visible canal hematoma. Disc levels: Cervical spondylosis. Most notably at C5-C6, there is severe disc degeneration with degenerative endplate irregularity and  sclerosis, a posterior disc osteophyte complex and uncovertebral hypertrophy. Upper chest: No consolidation within the imaged lung apices. No visible pneumothorax. Other: Right supraclavicular mass measuring 4.1 x 3.2 x 3.9 cm. IMPRESSION: CT head: 1. No evidence of acute intracranial abnormality. 2. Mild cerebral atrophy with moderate/advanced chronic small vessel ischemic disease, stable as compared to the brain MRI of 05/18/2019. 3. 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 CT cervical spine: 1. No evidence of acute fracture to the cervical spine. 2. Mild C7-T1 grade 1 anterolisthesis. 3. Cervical spondylosis, greatest at C5-C6. 4. 4.1 cm right supraclavicular mass increased in size since the prior neck CT of 12/14/2013 (measuring 3.3 cm in greatest dimension at that time). Review of prior electronic medical records shows that this mass was biopsied in 2015 and pathology was consistent with a schwannoma. Electronically Signed   By: Kellie Simmering DO   On: 03/19/2020 13:12   CT Cervical Spine Wo Contrast  Result Date: 03/19/2020 CLINICAL DATA:  Head trauma, minor. Additional history provided: Fall, mild headache. EXAM: CT HEAD WITHOUT CONTRAST CT CERVICAL SPINE WITHOUT CONTRAST TECHNIQUE: Multidetector CT imaging of the head and cervical spine was performed following the standard protocol without intravenous contrast. Multiplanar CT image reconstructions of the cervical spine were also generated. COMPARISON:  Brain MRI 05/18/2019. Head CT 05/18/2019. Neck CT 12/14/2013. FINDINGS: CT HEAD FINDINGS Brain: Mild generalized cerebral atrophy. Moderate/advanced ill-defined hypoattenuation within the cerebral white matter is nonspecific, but compatible with  chronic small vessel ischemic disease. There is no acute intracranial hemorrhage. No demarcated cortical infarct. No extra-axial fluid collection. No evidence of intracranial mass. No midline shift. Vascular: No hyperdense vessel.  Atherosclerotic calcifications. Skull: No calvarial fracture. 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 Sinuses/Orbits: Visualized orbits show no acute finding. Mild ethmoid sinus mucosal thickening. Small left sphenoid sinus mucous retention cyst. CT CERVICAL SPINE FINDINGS Alignment: Trace C7-T1 grade 1 anterolisthesis. Skull base and vertebrae: The basion-dental and atlanto-dental intervals are maintained.No evidence of acute fracture to the cervical spine. Soft tissues and spinal canal: No prevertebral fluid or swelling. No visible canal hematoma. Disc levels: Cervical spondylosis. Most notably at C5-C6, there is severe disc degeneration with degenerative endplate irregularity and sclerosis, a posterior disc osteophyte complex and uncovertebral hypertrophy. Upper chest: No consolidation within the imaged lung apices. No visible pneumothorax. Other: Right supraclavicular mass measuring 4.1 x 3.2 x 3.9 cm. IMPRESSION: CT head: 1. No evidence of acute intracranial abnormality. 2. Mild cerebral atrophy with moderate/advanced chronic small vessel ischemic disease, stable as compared to the brain MRI of 05/18/2019. 3. 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 CT cervical spine: 1. No evidence of acute fracture to the cervical spine. 2. Mild C7-T1 grade 1 anterolisthesis. 3. Cervical spondylosis, greatest at C5-C6. 4. 4.1 cm right supraclavicular mass increased in size since the prior neck CT of 12/14/2013 (measuring 3.3 cm in greatest dimension at that time). Review of prior electronic  medical records shows that this mass was biopsied in 2015 and pathology was consistent with a schwannoma. Electronically Signed   By: Kellie Simmering DO   On: 03/19/2020 13:12   ECHOCARDIOGRAM COMPLETE  Result Date: 03/20/2020    ECHOCARDIOGRAM REPORT   Patient Name:   ELZIE SHEETS Date of Exam: 03/19/2020 Medical Rec #:  [id number]  Height:       63.0 in Accession #:    [id number]         Weight:       113.0 lb Date of Birth:  Apr 04, 1919          BSA:          1.517 m Patient Age:    62 years           BP:           119/61 mmHg Patient Gender: F                  HR:           71 bpm. Exam Location:  [facility] Procedure: 2D Echo, Color Doppler and Cardiac Doppler Indications:     Elevated troponin  History:         Patient has prior history of Echocardiogram examinations, most                  recent 07/19/2019. Risk Factors:Hypertension and HCL.  Sonographer:     Charmayne Sheer RDCS (AE) Referring Phys:  Baker Janus Soledad Gerlach NIU Diagnosing Phys: Kate Sable MD  Sonographer Comments: No parasternal window and suboptimal apical window. Image acquisition challenging due to patient body habitus. IMPRESSIONS  1. Left ventricular ejection fraction, by estimation, is 60 to 65%. The left ventricle has normal function. The left ventricle has no regional wall motion abnormalities. There is mild left ventricular hypertrophy. Left ventricular diastolic parameters are consistent with Grade I diastolic dysfunction (impaired relaxation).  2. Right ventricular systolic function is normal. The right ventricular size is normal. There is mildly elevated pulmonary artery systolic pressure.  3. The mitral valve is degenerative. No evidence of mitral valve regurgitation. Mild mitral stenosis.  4. The aortic valve was not well visualized. Aortic valve regurgitation is not visualized.  5. Pulmonic valve regurgitation not assessed.  6. The inferior vena cava is normal in size with greater than 50% respiratory variability, suggesting right  atrial pressure of 3 mmHg. FINDINGS  Left Ventricle: Left ventricular ejection fraction, by estimation, is 60 to 65%. The left ventricle has normal function. The left ventricle has no regional wall motion abnormalities. The left ventricular internal cavity size was normal in size. There is  mild left ventricular hypertrophy. Left ventricular diastolic parameters are consistent with Grade I diastolic dysfunction (impaired relaxation). Right Ventricle: The right ventricular size is normal. Right vetricular wall thickness was not well visualized. Right ventricular systolic function is normal. There is mildly elevated pulmonary artery systolic pressure. The tricuspid regurgitant velocity  is 2.88 m/s, and with an assumed right atrial pressure of 3 mmHg, the estimated right ventricular systolic pressure is 54.6 mmHg. Left Atrium: Left atrial size was normal in size. Right Atrium: Right atrial size was normal in size. Pericardium: There is no evidence of pericardial effusion. Mitral Valve: The mitral valve is degenerative in appearance. Mild mitral annular calcification. No evidence of mitral valve regurgitation. Mild mitral valve stenosis. MV peak gradient, 14.3 mmHg. The mean mitral valve gradient is 5.0 mmHg. Tricuspid Valve: The tricuspid valve is normal in structure. Tricuspid valve regurgitation is mild. Aortic Valve: The aortic valve was not well visualized. Aortic valve regurgitation is not visualized. Aortic valve mean gradient measures 6.0 mmHg. Aortic valve peak gradient measures 10.0 mmHg. Aortic valve area, by VTI measures 1.75 cm. Pulmonic Valve: The pulmonic valve was not well visualized. Pulmonic valve regurgitation not assessed. Aorta: The aortic root was not  well visualized. Venous: The inferior vena cava is normal in size with greater than 50% respiratory variability, suggesting right atrial pressure of 3 mmHg. IAS/Shunts: No atrial level shunt detected by color flow Doppler.  LEFT VENTRICLE PLAX 2D  LVOT diam:     1.80 cm     Diastology LV SV:         48          LV e' medial:    5.11 cm/s LV SV Index:   31          LV E/e' medial:  17.3 LVOT Area:     2.54 cm    LV e' lateral:   5.77 cm/s                            LV E/e' lateral: 15.4  LV Volumes (MOD) LV vol d, MOD A2C: 31.4 ml LV vol d, MOD A4C: 40.6 ml LV vol s, MOD A2C: 11.6 ml LV vol s, MOD A4C: 11.5 ml LV SV MOD A2C:     19.8 ml LV SV MOD A4C:     40.6 ml LV SV MOD BP:      21.6 ml RIGHT VENTRICLE RV Basal diam:  2.59 cm LEFT ATRIUM             Index       RIGHT ATRIUM          Index LA Vol (A2C):   49.4 ml 32.56 ml/m RA Area:     8.05 cm LA Vol (A4C):   16.8 ml 11.07 ml/m RA Volume:   12.70 ml 8.37 ml/m LA Biplane Vol: 28.9 ml 19.05 ml/m  AORTIC VALVE AV Area (Vmax):    1.71 cm AV Area (Vmean):   1.47 cm AV Area (VTI):     1.75 cm AV Vmax:           158.00 cm/s AV Vmean:          120.000 cm/s AV VTI:            0.272 m AV Peak Grad:      10.0 mmHg AV Mean Grad:      6.0 mmHg LVOT Vmax:         106.00 cm/s LVOT Vmean:        69.400 cm/s LVOT VTI:          0.187 m LVOT/AV VTI ratio: 0.69  AORTA Ao Root diam: 2.50 cm MITRAL VALVE                TRICUSPID VALVE MV Area (PHT): 2.40 cm     TR Peak grad:   33.2 mmHg MV Peak grad:  14.3 mmHg    TR Vmax:        288.00 cm/s MV Mean grad:  5.0 mmHg MV Vmax:       1.89 m/s     SHUNTS MV Vmean:      103.0 cm/s   Systemic VTI:  0.19 m MV Decel Time: 316 msec     Systemic Diam: 1.80 cm MV E velocity: 88.60 cm/s MV A velocity: 174.00 cm/s MV E/A ratio:  0.51 Kate Sable MD Electronically signed by Kate Sable MD Signature Date/Time: 03/20/2020/8:45:21 AM    Final        Assessment & Plan:   Problem List Items Addressed This Visit    Weakness    Still recovering from recent hospitalization.  Arrange  home health - nursing for wound care and PT to evaluate and treat.  Initially misunderstood that home health was coming out and had ordered wound care to be added.  [employer].  Will  initiate home health.        Relevant Orders   Ambulatory referral to Home Health   UTI (urinary tract infection)    Complete course of omnicef.        Sebaceous cyst    Appears to have had a sebaceous cyst lower abdomen.  What sounds like sebaceous material drained and sebaceous material removed today.  No surrounding erythema.  Non tender.  Will have nursing to come out to help with wound care.  Cleaned and dressed today.        Relevant Orders   Ambulatory referral to Home Health   Schwannoma    Saw neurosurgery and ENT.  Stable.       Hypokalemia    Has a previous history of hypokalemia.  Off potassium now.  Recheck today.  If normal, will continue to hold.        Essential hypertension, benign    Blood pressure as outlined.  Continue entresto and lasix.  Check metabolic panel today.        Chronic diastolic CHF (congestive heart failure) ([place]) - Primary    On entresto and lasix.  Breathing stable.  Check metabolic panel today.       Relevant Orders   Basic metabolic panel (Completed)   Bradycardia    Pulse rate initially 49.  Recheck improved.  Follow.       Anxiety    Stable on lexapro.       Anemia    Recheck cbc today.       Relevant Orders   CBC with Differential/Platelet (Completed)       [name], MD

## 2020-03-26 ENCOUNTER — Other Ambulatory Visit: Payer: Self-pay | Admitting: Internal Medicine

## 2020-03-26 DIAGNOSIS — E871 Hypo-osmolality and hyponatremia: Secondary | ICD-10-CM

## 2020-03-26 NOTE — Progress Notes (Signed)
Order placed for f/u labs.  

## 2020-03-29 ENCOUNTER — Encounter: Payer: Self-pay | Admitting: Internal Medicine

## 2020-03-29 DIAGNOSIS — R001 Bradycardia, unspecified: Secondary | ICD-10-CM | POA: Insufficient documentation

## 2020-03-29 DIAGNOSIS — L723 Sebaceous cyst: Secondary | ICD-10-CM | POA: Insufficient documentation

## 2020-03-29 DIAGNOSIS — R531 Weakness: Secondary | ICD-10-CM | POA: Insufficient documentation

## 2020-03-29 NOTE — Assessment & Plan Note (Signed)
On entresto and lasix.  Breathing stable.  Check metabolic panel today.

## 2020-03-29 NOTE — Assessment & Plan Note (Signed)
Saw neurosurgery and ENT.  Stable.

## 2020-03-29 NOTE — Assessment & Plan Note (Signed)
Pulse rate initially 49.  Recheck improved.  Follow.

## 2020-03-29 NOTE — Assessment & Plan Note (Signed)
Has a previous history of hypokalemia.  Off potassium now.  Recheck today.  If normal, will continue to hold.

## 2020-03-29 NOTE — Assessment & Plan Note (Signed)
Still recovering from recent hospitalization.  Arrange home health - nursing for wound care and PT to evaluate and treat.  Initially misunderstood that home health was coming out and had ordered wound care to be added.  [employer].  Will initiate home health.

## 2020-03-29 NOTE — Assessment & Plan Note (Signed)
Complete course of omnicef.

## 2020-03-29 NOTE — Assessment & Plan Note (Signed)
Appears to have had a sebaceous cyst lower abdomen.  What sounds like sebaceous material drained and sebaceous material removed today.  No surrounding erythema.  Non tender.  Will have nursing to come out to help with wound care.  Cleaned and dressed today.

## 2020-03-29 NOTE — Assessment & Plan Note (Signed)
Stable on lexapro.   

## 2020-03-29 NOTE — Assessment & Plan Note (Signed)
Blood pressure as outlined.  Continue entresto and lasix.  Check metabolic panel today.

## 2020-03-29 NOTE — Assessment & Plan Note (Signed)
Recheck cbc today.   

## 2020-03-31 ENCOUNTER — Telehealth: Payer: Self-pay | Admitting: Internal Medicine

## 2020-03-31 NOTE — Telephone Encounter (Signed)
Pt daughter called to discuss the rx for a lift chair

## 2020-04-01 NOTE — Telephone Encounter (Signed)
Patient aware.

## 2020-04-01 NOTE — Telephone Encounter (Signed)
I have placed the [facility] referral for lift chair. Also p[atient could check with hospice store they may have one at a reduced price.

## 2020-04-01 NOTE — Telephone Encounter (Signed)
Daughter took DME to medical supply store and was told that medicare does not cover the chair in full and that she would be responsible for 200-300 dollars. Do you know of anywhere that covers them in full or can you help with this?

## 2020-04-01 NOTE — Telephone Encounter (Signed)
Please let patient know I am going to try and get her a chair then return message to me please.

## 2020-04-03 DIAGNOSIS — R531 Weakness: Secondary | ICD-10-CM | POA: Diagnosis not present

## 2020-04-03 DIAGNOSIS — I5032 Chronic diastolic (congestive) heart failure: Secondary | ICD-10-CM | POA: Diagnosis not present

## 2020-04-03 DIAGNOSIS — F32A Depression, unspecified: Secondary | ICD-10-CM | POA: Diagnosis not present

## 2020-04-03 DIAGNOSIS — I110 Hypertensive heart disease with heart failure: Secondary | ICD-10-CM | POA: Diagnosis not present

## 2020-04-03 DIAGNOSIS — Z87440 Personal history of urinary (tract) infections: Secondary | ICD-10-CM | POA: Diagnosis not present

## 2020-04-03 DIAGNOSIS — E7800 Pure hypercholesterolemia, unspecified: Secondary | ICD-10-CM | POA: Diagnosis not present

## 2020-04-03 DIAGNOSIS — Z8673 Personal history of transient ischemic attack (TIA), and cerebral infarction without residual deficits: Secondary | ICD-10-CM | POA: Diagnosis not present

## 2020-04-03 DIAGNOSIS — K219 Gastro-esophageal reflux disease without esophagitis: Secondary | ICD-10-CM | POA: Diagnosis not present

## 2020-04-03 DIAGNOSIS — D649 Anemia, unspecified: Secondary | ICD-10-CM | POA: Diagnosis not present

## 2020-04-03 DIAGNOSIS — L723 Sebaceous cyst: Secondary | ICD-10-CM | POA: Diagnosis not present

## 2020-04-04 DIAGNOSIS — F32A Depression, unspecified: Secondary | ICD-10-CM | POA: Diagnosis not present

## 2020-04-04 DIAGNOSIS — Z8673 Personal history of transient ischemic attack (TIA), and cerebral infarction without residual deficits: Secondary | ICD-10-CM | POA: Diagnosis not present

## 2020-04-04 DIAGNOSIS — I5032 Chronic diastolic (congestive) heart failure: Secondary | ICD-10-CM | POA: Diagnosis not present

## 2020-04-04 DIAGNOSIS — D649 Anemia, unspecified: Secondary | ICD-10-CM | POA: Diagnosis not present

## 2020-04-04 DIAGNOSIS — R531 Weakness: Secondary | ICD-10-CM | POA: Diagnosis not present

## 2020-04-04 DIAGNOSIS — L723 Sebaceous cyst: Secondary | ICD-10-CM | POA: Diagnosis not present

## 2020-04-04 DIAGNOSIS — I110 Hypertensive heart disease with heart failure: Secondary | ICD-10-CM | POA: Diagnosis not present

## 2020-04-04 DIAGNOSIS — E7800 Pure hypercholesterolemia, unspecified: Secondary | ICD-10-CM | POA: Diagnosis not present

## 2020-04-04 DIAGNOSIS — Z87440 Personal history of urinary (tract) infections: Secondary | ICD-10-CM | POA: Diagnosis not present

## 2020-04-04 DIAGNOSIS — K219 Gastro-esophageal reflux disease without esophagitis: Secondary | ICD-10-CM | POA: Diagnosis not present

## 2020-04-05 DIAGNOSIS — I10 Essential (primary) hypertension: Secondary | ICD-10-CM | POA: Diagnosis not present

## 2020-04-08 DIAGNOSIS — E7800 Pure hypercholesterolemia, unspecified: Secondary | ICD-10-CM | POA: Diagnosis not present

## 2020-04-08 DIAGNOSIS — F32A Depression, unspecified: Secondary | ICD-10-CM | POA: Diagnosis not present

## 2020-04-08 DIAGNOSIS — D649 Anemia, unspecified: Secondary | ICD-10-CM | POA: Diagnosis not present

## 2020-04-08 DIAGNOSIS — Z87440 Personal history of urinary (tract) infections: Secondary | ICD-10-CM | POA: Diagnosis not present

## 2020-04-08 DIAGNOSIS — Z8673 Personal history of transient ischemic attack (TIA), and cerebral infarction without residual deficits: Secondary | ICD-10-CM | POA: Diagnosis not present

## 2020-04-08 DIAGNOSIS — R531 Weakness: Secondary | ICD-10-CM | POA: Diagnosis not present

## 2020-04-08 DIAGNOSIS — L723 Sebaceous cyst: Secondary | ICD-10-CM | POA: Diagnosis not present

## 2020-04-08 DIAGNOSIS — I5032 Chronic diastolic (congestive) heart failure: Secondary | ICD-10-CM | POA: Diagnosis not present

## 2020-04-08 DIAGNOSIS — I110 Hypertensive heart disease with heart failure: Secondary | ICD-10-CM | POA: Diagnosis not present

## 2020-04-08 DIAGNOSIS — K219 Gastro-esophageal reflux disease without esophagitis: Secondary | ICD-10-CM | POA: Diagnosis not present

## 2020-04-10 DIAGNOSIS — I5032 Chronic diastolic (congestive) heart failure: Secondary | ICD-10-CM | POA: Diagnosis not present

## 2020-04-10 DIAGNOSIS — E7800 Pure hypercholesterolemia, unspecified: Secondary | ICD-10-CM | POA: Diagnosis not present

## 2020-04-10 DIAGNOSIS — J9601 Acute respiratory failure with hypoxia: Secondary | ICD-10-CM | POA: Diagnosis not present

## 2020-04-10 DIAGNOSIS — L723 Sebaceous cyst: Secondary | ICD-10-CM | POA: Diagnosis not present

## 2020-04-10 DIAGNOSIS — Z8673 Personal history of transient ischemic attack (TIA), and cerebral infarction without residual deficits: Secondary | ICD-10-CM | POA: Diagnosis not present

## 2020-04-10 DIAGNOSIS — K219 Gastro-esophageal reflux disease without esophagitis: Secondary | ICD-10-CM | POA: Diagnosis not present

## 2020-04-10 DIAGNOSIS — J9611 Chronic respiratory failure with hypoxia: Secondary | ICD-10-CM | POA: Diagnosis not present

## 2020-04-10 DIAGNOSIS — Z9181 History of falling: Secondary | ICD-10-CM | POA: Diagnosis not present

## 2020-04-10 DIAGNOSIS — Z87440 Personal history of urinary (tract) infections: Secondary | ICD-10-CM | POA: Diagnosis not present

## 2020-04-10 DIAGNOSIS — D649 Anemia, unspecified: Secondary | ICD-10-CM | POA: Diagnosis not present

## 2020-04-10 DIAGNOSIS — F32A Depression, unspecified: Secondary | ICD-10-CM | POA: Diagnosis not present

## 2020-04-10 DIAGNOSIS — I5031 Acute diastolic (congestive) heart failure: Secondary | ICD-10-CM | POA: Diagnosis not present

## 2020-04-10 DIAGNOSIS — I110 Hypertensive heart disease with heart failure: Secondary | ICD-10-CM | POA: Diagnosis not present

## 2020-04-10 DIAGNOSIS — R531 Weakness: Secondary | ICD-10-CM | POA: Diagnosis not present

## 2020-04-14 ENCOUNTER — Telehealth: Payer: Self-pay | Admitting: Internal Medicine

## 2020-04-14 NOTE — Telephone Encounter (Signed)
Referral Reason: Assistance with getting a lift chair  Interventions: Called patient's daugher Nicanor Alcon regarding referral for a lift chair for Ms. Veldman. Could not leave message due to voicemail box being full.   Follow up plan: Care guide will follow up with patient by phone over the next week.

## 2020-04-14 NOTE — Telephone Encounter (Signed)
   SF 04/14/2020   Name: Taylor Watson   MRN: [id number]   DOB: 13-Feb-1919   AGE: 86 y.o.   GENDER: female   PCP [name], MD.   Referral Reason: Assistance with getting a lift chair  Interventions: Called [facility] [employer] to see if they offer any assistance with lift chairs. Spoke with Tammy, she stated that it depends on their donations. Sometimes they will get a lift chair, but it is not something that they commonly get. Tammy advised for patient or patient's caregiver to see if the [employer] or [employer] may have a lift chair. Tammy also advised that patient can give local churches a call if she needs financial assistance.   Follow up plan: Client will give patient's daughter a call to see if she still needs assistance with getting a lift chair and also give her the information received from [employer].      [place], Care Management Phone: [phone] Email: [email or web address]

## 2020-04-15 DIAGNOSIS — R531 Weakness: Secondary | ICD-10-CM | POA: Diagnosis not present

## 2020-04-15 DIAGNOSIS — D649 Anemia, unspecified: Secondary | ICD-10-CM | POA: Diagnosis not present

## 2020-04-15 DIAGNOSIS — F32A Depression, unspecified: Secondary | ICD-10-CM | POA: Diagnosis not present

## 2020-04-15 DIAGNOSIS — Z8673 Personal history of transient ischemic attack (TIA), and cerebral infarction without residual deficits: Secondary | ICD-10-CM | POA: Diagnosis not present

## 2020-04-15 DIAGNOSIS — K219 Gastro-esophageal reflux disease without esophagitis: Secondary | ICD-10-CM | POA: Diagnosis not present

## 2020-04-15 DIAGNOSIS — I110 Hypertensive heart disease with heart failure: Secondary | ICD-10-CM | POA: Diagnosis not present

## 2020-04-15 DIAGNOSIS — E7800 Pure hypercholesterolemia, unspecified: Secondary | ICD-10-CM | POA: Diagnosis not present

## 2020-04-15 DIAGNOSIS — L723 Sebaceous cyst: Secondary | ICD-10-CM | POA: Diagnosis not present

## 2020-04-15 DIAGNOSIS — I5032 Chronic diastolic (congestive) heart failure: Secondary | ICD-10-CM | POA: Diagnosis not present

## 2020-04-15 DIAGNOSIS — Z87440 Personal history of urinary (tract) infections: Secondary | ICD-10-CM | POA: Diagnosis not present

## 2020-04-16 ENCOUNTER — Other Ambulatory Visit: Payer: Medicare Other

## 2020-04-16 DIAGNOSIS — R0602 Shortness of breath: Secondary | ICD-10-CM | POA: Diagnosis not present

## 2020-04-17 DIAGNOSIS — Z87440 Personal history of urinary (tract) infections: Secondary | ICD-10-CM | POA: Diagnosis not present

## 2020-04-17 DIAGNOSIS — D649 Anemia, unspecified: Secondary | ICD-10-CM | POA: Diagnosis not present

## 2020-04-17 DIAGNOSIS — K219 Gastro-esophageal reflux disease without esophagitis: Secondary | ICD-10-CM | POA: Diagnosis not present

## 2020-04-17 DIAGNOSIS — I110 Hypertensive heart disease with heart failure: Secondary | ICD-10-CM | POA: Diagnosis not present

## 2020-04-17 DIAGNOSIS — L723 Sebaceous cyst: Secondary | ICD-10-CM | POA: Diagnosis not present

## 2020-04-17 DIAGNOSIS — R531 Weakness: Secondary | ICD-10-CM | POA: Diagnosis not present

## 2020-04-17 DIAGNOSIS — I5032 Chronic diastolic (congestive) heart failure: Secondary | ICD-10-CM | POA: Diagnosis not present

## 2020-04-17 DIAGNOSIS — E7800 Pure hypercholesterolemia, unspecified: Secondary | ICD-10-CM | POA: Diagnosis not present

## 2020-04-17 DIAGNOSIS — Z8673 Personal history of transient ischemic attack (TIA), and cerebral infarction without residual deficits: Secondary | ICD-10-CM | POA: Diagnosis not present

## 2020-04-17 DIAGNOSIS — F32A Depression, unspecified: Secondary | ICD-10-CM | POA: Diagnosis not present

## 2020-04-17 NOTE — Telephone Encounter (Signed)
   SF 04/17/2020   Name: Taylor Watson   MRN: [id number]   DOB: 1919-03-30   AGE: 86 y.o.   GENDER: female   PCP [name], MD.    Referral Reason: Assistance with getting a lift chair  Interventions: Called patient's daugher Nicanor Alcon regarding referral for a lift chair for [name]. Could not leave message due to voicemail box being full.   Follow up plan: Care guide will follow up with patient by phone over the next week.    [place], Care Management Phone: [phone] Email: [email or web address]

## 2020-04-22 DIAGNOSIS — R531 Weakness: Secondary | ICD-10-CM | POA: Diagnosis not present

## 2020-04-22 DIAGNOSIS — I5032 Chronic diastolic (congestive) heart failure: Secondary | ICD-10-CM | POA: Diagnosis not present

## 2020-04-22 DIAGNOSIS — Z8673 Personal history of transient ischemic attack (TIA), and cerebral infarction without residual deficits: Secondary | ICD-10-CM | POA: Diagnosis not present

## 2020-04-22 DIAGNOSIS — F32A Depression, unspecified: Secondary | ICD-10-CM | POA: Diagnosis not present

## 2020-04-22 DIAGNOSIS — Z87440 Personal history of urinary (tract) infections: Secondary | ICD-10-CM | POA: Diagnosis not present

## 2020-04-22 DIAGNOSIS — E7800 Pure hypercholesterolemia, unspecified: Secondary | ICD-10-CM | POA: Diagnosis not present

## 2020-04-22 DIAGNOSIS — I110 Hypertensive heart disease with heart failure: Secondary | ICD-10-CM | POA: Diagnosis not present

## 2020-04-22 DIAGNOSIS — L723 Sebaceous cyst: Secondary | ICD-10-CM | POA: Diagnosis not present

## 2020-04-22 DIAGNOSIS — K219 Gastro-esophageal reflux disease without esophagitis: Secondary | ICD-10-CM | POA: Diagnosis not present

## 2020-04-22 DIAGNOSIS — D649 Anemia, unspecified: Secondary | ICD-10-CM | POA: Diagnosis not present

## 2020-04-24 DIAGNOSIS — K219 Gastro-esophageal reflux disease without esophagitis: Secondary | ICD-10-CM | POA: Diagnosis not present

## 2020-04-24 DIAGNOSIS — R531 Weakness: Secondary | ICD-10-CM | POA: Diagnosis not present

## 2020-04-24 DIAGNOSIS — Z87440 Personal history of urinary (tract) infections: Secondary | ICD-10-CM | POA: Diagnosis not present

## 2020-04-24 DIAGNOSIS — E7800 Pure hypercholesterolemia, unspecified: Secondary | ICD-10-CM | POA: Diagnosis not present

## 2020-04-24 DIAGNOSIS — I5032 Chronic diastolic (congestive) heart failure: Secondary | ICD-10-CM | POA: Diagnosis not present

## 2020-04-24 DIAGNOSIS — F32A Depression, unspecified: Secondary | ICD-10-CM | POA: Diagnosis not present

## 2020-04-24 DIAGNOSIS — Z8673 Personal history of transient ischemic attack (TIA), and cerebral infarction without residual deficits: Secondary | ICD-10-CM | POA: Diagnosis not present

## 2020-04-24 DIAGNOSIS — L723 Sebaceous cyst: Secondary | ICD-10-CM | POA: Diagnosis not present

## 2020-04-24 DIAGNOSIS — I110 Hypertensive heart disease with heart failure: Secondary | ICD-10-CM | POA: Diagnosis not present

## 2020-04-24 DIAGNOSIS — D649 Anemia, unspecified: Secondary | ICD-10-CM | POA: Diagnosis not present

## 2020-04-29 DIAGNOSIS — Z8673 Personal history of transient ischemic attack (TIA), and cerebral infarction without residual deficits: Secondary | ICD-10-CM | POA: Diagnosis not present

## 2020-04-29 DIAGNOSIS — E7800 Pure hypercholesterolemia, unspecified: Secondary | ICD-10-CM | POA: Diagnosis not present

## 2020-04-29 DIAGNOSIS — I5032 Chronic diastolic (congestive) heart failure: Secondary | ICD-10-CM | POA: Diagnosis not present

## 2020-04-29 DIAGNOSIS — F32A Depression, unspecified: Secondary | ICD-10-CM | POA: Diagnosis not present

## 2020-04-29 DIAGNOSIS — D649 Anemia, unspecified: Secondary | ICD-10-CM | POA: Diagnosis not present

## 2020-04-29 DIAGNOSIS — L723 Sebaceous cyst: Secondary | ICD-10-CM | POA: Diagnosis not present

## 2020-04-29 DIAGNOSIS — Z87440 Personal history of urinary (tract) infections: Secondary | ICD-10-CM | POA: Diagnosis not present

## 2020-04-29 DIAGNOSIS — I110 Hypertensive heart disease with heart failure: Secondary | ICD-10-CM | POA: Diagnosis not present

## 2020-04-29 DIAGNOSIS — K219 Gastro-esophageal reflux disease without esophagitis: Secondary | ICD-10-CM | POA: Diagnosis not present

## 2020-04-29 DIAGNOSIS — R531 Weakness: Secondary | ICD-10-CM | POA: Diagnosis not present

## 2020-05-06 ENCOUNTER — Telehealth: Payer: Self-pay | Admitting: Internal Medicine

## 2020-05-06 DIAGNOSIS — F32A Depression, unspecified: Secondary | ICD-10-CM | POA: Diagnosis not present

## 2020-05-06 DIAGNOSIS — L723 Sebaceous cyst: Secondary | ICD-10-CM | POA: Diagnosis not present

## 2020-05-06 DIAGNOSIS — I10 Essential (primary) hypertension: Secondary | ICD-10-CM | POA: Diagnosis not present

## 2020-05-06 DIAGNOSIS — K219 Gastro-esophageal reflux disease without esophagitis: Secondary | ICD-10-CM | POA: Diagnosis not present

## 2020-05-06 DIAGNOSIS — I110 Hypertensive heart disease with heart failure: Secondary | ICD-10-CM | POA: Diagnosis not present

## 2020-05-06 DIAGNOSIS — R531 Weakness: Secondary | ICD-10-CM | POA: Diagnosis not present

## 2020-05-06 DIAGNOSIS — D649 Anemia, unspecified: Secondary | ICD-10-CM | POA: Diagnosis not present

## 2020-05-06 DIAGNOSIS — I5032 Chronic diastolic (congestive) heart failure: Secondary | ICD-10-CM | POA: Diagnosis not present

## 2020-05-06 DIAGNOSIS — Z8673 Personal history of transient ischemic attack (TIA), and cerebral infarction without residual deficits: Secondary | ICD-10-CM | POA: Diagnosis not present

## 2020-05-06 DIAGNOSIS — E7800 Pure hypercholesterolemia, unspecified: Secondary | ICD-10-CM | POA: Diagnosis not present

## 2020-05-06 DIAGNOSIS — Z87440 Personal history of urinary (tract) infections: Secondary | ICD-10-CM | POA: Diagnosis not present

## 2020-05-06 NOTE — Telephone Encounter (Signed)
Left message to return call 

## 2020-05-06 NOTE — Telephone Encounter (Signed)
Arbie Cookey with [facility] called to report that patient may still have an ongoing UTI and they wanted to know if a urine request could be done   Please call [phone]

## 2020-05-06 NOTE — Telephone Encounter (Signed)
Taylor Watson returned your call would like a call back.

## 2020-05-07 NOTE — Telephone Encounter (Signed)
LM for patient and home health

## 2020-05-08 ENCOUNTER — Other Ambulatory Visit: Payer: Self-pay

## 2020-05-08 ENCOUNTER — Other Ambulatory Visit: Payer: Medicare Other

## 2020-05-08 ENCOUNTER — Telehealth: Payer: Self-pay | Admitting: Internal Medicine

## 2020-05-08 DIAGNOSIS — F32A Depression, unspecified: Secondary | ICD-10-CM | POA: Diagnosis not present

## 2020-05-08 DIAGNOSIS — R300 Dysuria: Secondary | ICD-10-CM

## 2020-05-08 DIAGNOSIS — E7800 Pure hypercholesterolemia, unspecified: Secondary | ICD-10-CM | POA: Diagnosis not present

## 2020-05-08 DIAGNOSIS — L723 Sebaceous cyst: Secondary | ICD-10-CM | POA: Diagnosis not present

## 2020-05-08 DIAGNOSIS — K219 Gastro-esophageal reflux disease without esophagitis: Secondary | ICD-10-CM | POA: Diagnosis not present

## 2020-05-08 DIAGNOSIS — I5032 Chronic diastolic (congestive) heart failure: Secondary | ICD-10-CM | POA: Diagnosis not present

## 2020-05-08 DIAGNOSIS — I110 Hypertensive heart disease with heart failure: Secondary | ICD-10-CM | POA: Diagnosis not present

## 2020-05-08 DIAGNOSIS — R531 Weakness: Secondary | ICD-10-CM | POA: Diagnosis not present

## 2020-05-08 DIAGNOSIS — Z8673 Personal history of transient ischemic attack (TIA), and cerebral infarction without residual deficits: Secondary | ICD-10-CM | POA: Diagnosis not present

## 2020-05-08 DIAGNOSIS — Z87440 Personal history of urinary (tract) infections: Secondary | ICD-10-CM | POA: Diagnosis not present

## 2020-05-08 DIAGNOSIS — D649 Anemia, unspecified: Secondary | ICD-10-CM | POA: Diagnosis not present

## 2020-05-08 NOTE — Telephone Encounter (Signed)
Taylor Watson is dropping off urine here. Order placed for culture. Confirmed she is doing ok. Provided [facility] nurse with number to heart clinic so she could notify them. Nurse went over signs and symptoms with family for signs of low HR and BP since they have no way to check at home. When I spoke with daughter to move appt up with Dr Nicki Reaper, pt passed screening but daughter noted that she had a "cold" and have been having sinus symptoms the last few days. Confirmed with daughter that she has capability to do video visit on her cell phone. Advised that given her symptoms we cannot have them come in the office. We will do virtual tomorrow at 11:30 and cancel appt on Tuesday.

## 2020-05-08 NOTE — Telephone Encounter (Signed)
[facility] home health nurse [place] called about patient vital being low heart rate 41 bp 96/58 collecting urine for UTI also her contact number is [phone]

## 2020-05-09 ENCOUNTER — Encounter: Payer: Self-pay | Admitting: Internal Medicine

## 2020-05-09 ENCOUNTER — Other Ambulatory Visit: Payer: Self-pay

## 2020-05-09 ENCOUNTER — Telehealth (INDEPENDENT_AMBULATORY_CARE_PROVIDER_SITE_OTHER): Payer: Medicare Other | Admitting: Internal Medicine

## 2020-05-09 DIAGNOSIS — I10 Essential (primary) hypertension: Secondary | ICD-10-CM

## 2020-05-09 DIAGNOSIS — R8290 Unspecified abnormal findings in urine: Secondary | ICD-10-CM

## 2020-05-09 DIAGNOSIS — I4891 Unspecified atrial fibrillation: Secondary | ICD-10-CM | POA: Diagnosis not present

## 2020-05-09 DIAGNOSIS — Z8673 Personal history of transient ischemic attack (TIA), and cerebral infarction without residual deficits: Secondary | ICD-10-CM | POA: Diagnosis not present

## 2020-05-09 DIAGNOSIS — I5032 Chronic diastolic (congestive) heart failure: Secondary | ICD-10-CM

## 2020-05-09 LAB — URINE CULTURE
MICRO NUMBER:: 11390088
Result:: NO GROWTH
SPECIMEN QUALITY:: ADEQUATE

## 2020-05-09 NOTE — Progress Notes (Signed)
Patient ID: Taylor Watson, female   DOB: 02-Dec-1918, 87 y.o.   MRN: [id number]   Virtual Visit via video Note  This visit type was conducted due to national recommendations for restrictions regarding the COVID-19 pandemic (e.g. social distancing).  This format is felt to be most appropriate for this patient at this time.  All issues noted in this document were discussed and addressed.  No physical exam was performed (except for noted visual exam findings with Video Visits).   I connected with Taylor Watson by a video enabled telemedicine application and verified that I am speaking with the correct person using two identifiers. Location patient: home Location provider: work  Persons participating in the virtual visit: patient, provider and pts daughter Taylor Watson  The limitations, risks, security and privacy concerns of performing an evaluation and management service by video and the availability of in person appointments have been discussed.  it has also been discussed with the patient that there may be a patient responsible charge related to this service. The patient expressed understanding and agreed to proceed.   Reason for visit: work in appt.   HPI: Home health called yesterday with concerns regarding a possible UTI.  Blood pressure documented 96/58 and pulse rate 41.  On questioning, Taylor Watson reports that Taylor Watson is doing ok.  No dizziness or light headedness.  No chest pain.  Breathing stable.  Gets up and walks around the house with walker.  Taylor Watson denies any pain.  No burning with urination.  Her appetite is stable.  No nausea or vomiting.    ROS: See pertinent positives and negatives per HPI.  Past Medical History:  Diagnosis Date   Allergy    Anemia    Arrhythmia    CHF (congestive heart failure) (HCC)    CVA (cerebral vascular accident) ([place])    Depression    GERD (gastroesophageal reflux disease)    History of blood transfusion    History of kidney  stones    Hx: UTI (urinary tract infection)    Hypercholesterolemia    Hypertension     Past Surgical History:  Procedure Laterality Date   CHOLECYSTECTOMY  1984   NECK LESION BIOPSY  2015   Followed by Dr. Richardson Landry    Family History  Problem Relation Age of Onset   Cancer Mother        unknown type   Hypertension Mother    Cancer Father        unknown type   Cancer Daughter    Colon cancer Other        grandfather    SOCIAL HX: reviewed.    Current Outpatient Medications:    aspirin EC 81 MG EC tablet, Take 1 tablet (81 mg total) by mouth daily., Disp: 30 tablet, Rfl: 0   furosemide (LASIX) 40 MG tablet, Take 0.5 tablets (20 mg total) by mouth daily., Disp: 90 tablet, Rfl: 1   sacubitril-valsartan (ENTRESTO) 49-51 MG, Take 1 tablet by mouth 2 (two) times daily. (Patient taking differently: Take 0.5 tablets by mouth 2 (two) times daily.), Disp: 60 tablet, Rfl: 5  EXAM:  VITALS per patient if applicable: pulse recheck today 64.    GENERAL: alert, oriented, appears well and in no acute distress  HEENT: atraumatic, conjunttiva clear, no obvious abnormalities on inspection of external nose and ears  NECK: normal movements of the head and neck  LUNGS: on inspection no signs of respiratory distress, breathing rate appears normal, no obvious gross SOB, gasping  or wheezing  CV: no obvious cyanosis  PSYCH/NEURO: pleasant and cooperative, no obvious depression or anxiety, speech and thought processing grossly intact  ASSESSMENT AND PLAN:  Discussed the following assessment and plan:  Problem List Items Addressed This Visit    History of CVA (cerebrovascular accident)    Remains on aspirin daily.  Follow.       Essential hypertension, benign    Continues on 1/2 entresto bid and lasix.  Breathing stable.  No way to recheck blood pressure at her home.  Pulse rate 64.  No dizziness or light headedness.  Continue entrestro and lasix.  Follow.        Atrial  fibrillation with rapid ventricular response (HCC)    On aspirin.  Denies increased heart rate or palpitations.  Stable.       Chronic diastolic CHF (congestive heart failure) (HCC)    Continue entrestro and lasix.  No obvious evidence of volume overload.  Follow metabolic panel.       Bad odor of urine    Home health was concerned regarding bad odor urine.  They collected urine for urine culture.  Will hold on abx.  She is doing well and has no acute symptoms currently.  Await culture results.            I discussed the assessment and treatment plan with the patient. The patient was provided an opportunity to ask questions and all were answered. The patient agreed with the plan and demonstrated an understanding of the instructions.   The patient was advised to call back or seek an in-person evaluation if the symptoms worsen or if the condition fails to improve as anticipated.    [name], MD

## 2020-05-11 ENCOUNTER — Encounter: Payer: Self-pay | Admitting: Internal Medicine

## 2020-05-11 DIAGNOSIS — R8290 Unspecified abnormal findings in urine: Secondary | ICD-10-CM | POA: Insufficient documentation

## 2020-05-11 DIAGNOSIS — I5031 Acute diastolic (congestive) heart failure: Secondary | ICD-10-CM | POA: Diagnosis not present

## 2020-05-11 DIAGNOSIS — Z8673 Personal history of transient ischemic attack (TIA), and cerebral infarction without residual deficits: Secondary | ICD-10-CM | POA: Diagnosis not present

## 2020-05-11 DIAGNOSIS — Z9181 History of falling: Secondary | ICD-10-CM | POA: Diagnosis not present

## 2020-05-11 DIAGNOSIS — J9611 Chronic respiratory failure with hypoxia: Secondary | ICD-10-CM | POA: Diagnosis not present

## 2020-05-11 DIAGNOSIS — J9601 Acute respiratory failure with hypoxia: Secondary | ICD-10-CM | POA: Diagnosis not present

## 2020-05-11 NOTE — Assessment & Plan Note (Signed)
On aspirin.  Denies increased heart rate or palpitations.  Stable.

## 2020-05-11 NOTE — Assessment & Plan Note (Signed)
Remains on aspirin daily.  Follow.

## 2020-05-11 NOTE — Assessment & Plan Note (Signed)
Continue entrestro and lasix.  No obvious evidence of volume overload.  Follow metabolic panel.

## 2020-05-11 NOTE — Assessment & Plan Note (Signed)
Home health was concerned regarding bad odor urine.  They collected urine for urine culture.  Will hold on abx.  She is doing well and has no acute symptoms currently.  Await culture results.

## 2020-05-11 NOTE — Assessment & Plan Note (Signed)
Continues on 1/2 entresto bid and lasix.  Breathing stable.  No way to recheck blood pressure at her home.  Pulse rate 64.  No dizziness or light headedness.  Continue entrestro and lasix.  Follow.

## 2020-05-13 ENCOUNTER — Ambulatory Visit: Payer: Medicare Other | Admitting: Internal Medicine

## 2020-05-13 DIAGNOSIS — D649 Anemia, unspecified: Secondary | ICD-10-CM | POA: Diagnosis not present

## 2020-05-13 DIAGNOSIS — I110 Hypertensive heart disease with heart failure: Secondary | ICD-10-CM | POA: Diagnosis not present

## 2020-05-13 DIAGNOSIS — L723 Sebaceous cyst: Secondary | ICD-10-CM | POA: Diagnosis not present

## 2020-05-13 DIAGNOSIS — K219 Gastro-esophageal reflux disease without esophagitis: Secondary | ICD-10-CM | POA: Diagnosis not present

## 2020-05-13 DIAGNOSIS — Z87440 Personal history of urinary (tract) infections: Secondary | ICD-10-CM | POA: Diagnosis not present

## 2020-05-13 DIAGNOSIS — E7800 Pure hypercholesterolemia, unspecified: Secondary | ICD-10-CM | POA: Diagnosis not present

## 2020-05-13 DIAGNOSIS — R531 Weakness: Secondary | ICD-10-CM | POA: Diagnosis not present

## 2020-05-13 DIAGNOSIS — Z8673 Personal history of transient ischemic attack (TIA), and cerebral infarction without residual deficits: Secondary | ICD-10-CM | POA: Diagnosis not present

## 2020-05-13 DIAGNOSIS — I5032 Chronic diastolic (congestive) heart failure: Secondary | ICD-10-CM | POA: Diagnosis not present

## 2020-05-13 DIAGNOSIS — F32A Depression, unspecified: Secondary | ICD-10-CM | POA: Diagnosis not present

## 2020-05-17 DIAGNOSIS — R0602 Shortness of breath: Secondary | ICD-10-CM | POA: Diagnosis not present

## 2020-05-23 DIAGNOSIS — R531 Weakness: Secondary | ICD-10-CM | POA: Diagnosis not present

## 2020-05-23 DIAGNOSIS — K219 Gastro-esophageal reflux disease without esophagitis: Secondary | ICD-10-CM | POA: Diagnosis not present

## 2020-05-23 DIAGNOSIS — D649 Anemia, unspecified: Secondary | ICD-10-CM | POA: Diagnosis not present

## 2020-05-23 DIAGNOSIS — I5032 Chronic diastolic (congestive) heart failure: Secondary | ICD-10-CM | POA: Diagnosis not present

## 2020-05-23 DIAGNOSIS — Z8673 Personal history of transient ischemic attack (TIA), and cerebral infarction without residual deficits: Secondary | ICD-10-CM | POA: Diagnosis not present

## 2020-05-23 DIAGNOSIS — I110 Hypertensive heart disease with heart failure: Secondary | ICD-10-CM | POA: Diagnosis not present

## 2020-05-23 DIAGNOSIS — F32A Depression, unspecified: Secondary | ICD-10-CM | POA: Diagnosis not present

## 2020-05-23 DIAGNOSIS — Z87440 Personal history of urinary (tract) infections: Secondary | ICD-10-CM | POA: Diagnosis not present

## 2020-05-23 DIAGNOSIS — L723 Sebaceous cyst: Secondary | ICD-10-CM | POA: Diagnosis not present

## 2020-05-23 DIAGNOSIS — E7800 Pure hypercholesterolemia, unspecified: Secondary | ICD-10-CM | POA: Diagnosis not present

## 2020-05-28 DIAGNOSIS — D649 Anemia, unspecified: Secondary | ICD-10-CM | POA: Diagnosis not present

## 2020-05-28 DIAGNOSIS — K219 Gastro-esophageal reflux disease without esophagitis: Secondary | ICD-10-CM | POA: Diagnosis not present

## 2020-05-28 DIAGNOSIS — Z8673 Personal history of transient ischemic attack (TIA), and cerebral infarction without residual deficits: Secondary | ICD-10-CM | POA: Diagnosis not present

## 2020-05-28 DIAGNOSIS — R531 Weakness: Secondary | ICD-10-CM | POA: Diagnosis not present

## 2020-05-28 DIAGNOSIS — L723 Sebaceous cyst: Secondary | ICD-10-CM | POA: Diagnosis not present

## 2020-05-28 DIAGNOSIS — E7800 Pure hypercholesterolemia, unspecified: Secondary | ICD-10-CM | POA: Diagnosis not present

## 2020-05-28 DIAGNOSIS — Z87440 Personal history of urinary (tract) infections: Secondary | ICD-10-CM | POA: Diagnosis not present

## 2020-05-28 DIAGNOSIS — I110 Hypertensive heart disease with heart failure: Secondary | ICD-10-CM | POA: Diagnosis not present

## 2020-05-28 DIAGNOSIS — I5032 Chronic diastolic (congestive) heart failure: Secondary | ICD-10-CM | POA: Diagnosis not present

## 2020-05-28 DIAGNOSIS — F32A Depression, unspecified: Secondary | ICD-10-CM | POA: Diagnosis not present

## 2020-06-06 DIAGNOSIS — I10 Essential (primary) hypertension: Secondary | ICD-10-CM | POA: Diagnosis not present

## 2020-06-11 DIAGNOSIS — Z8673 Personal history of transient ischemic attack (TIA), and cerebral infarction without residual deficits: Secondary | ICD-10-CM | POA: Diagnosis not present

## 2020-06-11 DIAGNOSIS — I5031 Acute diastolic (congestive) heart failure: Secondary | ICD-10-CM | POA: Diagnosis not present

## 2020-06-11 DIAGNOSIS — Z9181 History of falling: Secondary | ICD-10-CM | POA: Diagnosis not present

## 2020-06-11 DIAGNOSIS — J9611 Chronic respiratory failure with hypoxia: Secondary | ICD-10-CM | POA: Diagnosis not present

## 2020-06-11 DIAGNOSIS — J9601 Acute respiratory failure with hypoxia: Secondary | ICD-10-CM | POA: Diagnosis not present

## 2020-06-17 DIAGNOSIS — R0602 Shortness of breath: Secondary | ICD-10-CM | POA: Diagnosis not present

## 2020-06-23 ENCOUNTER — Telehealth: Payer: Self-pay | Admitting: Internal Medicine

## 2020-06-23 NOTE — Telephone Encounter (Signed)
Pt daughter called and wanted to follow up on a speciality chair for pt

## 2020-06-25 NOTE — Telephone Encounter (Signed)
Gsve daughter number for care coordinator. They had tried reaching her. She is also going to try hospice as well.

## 2020-07-04 DIAGNOSIS — I10 Essential (primary) hypertension: Secondary | ICD-10-CM | POA: Diagnosis not present

## 2020-07-09 DIAGNOSIS — J9601 Acute respiratory failure with hypoxia: Secondary | ICD-10-CM | POA: Diagnosis not present

## 2020-07-09 DIAGNOSIS — J9611 Chronic respiratory failure with hypoxia: Secondary | ICD-10-CM | POA: Diagnosis not present

## 2020-07-09 DIAGNOSIS — Z8673 Personal history of transient ischemic attack (TIA), and cerebral infarction without residual deficits: Secondary | ICD-10-CM | POA: Diagnosis not present

## 2020-07-09 DIAGNOSIS — I5031 Acute diastolic (congestive) heart failure: Secondary | ICD-10-CM | POA: Diagnosis not present

## 2020-07-09 DIAGNOSIS — Z9181 History of falling: Secondary | ICD-10-CM | POA: Diagnosis not present

## 2020-07-15 DIAGNOSIS — R0602 Shortness of breath: Secondary | ICD-10-CM | POA: Diagnosis not present

## 2020-07-22 ENCOUNTER — Telehealth: Payer: Self-pay | Admitting: Internal Medicine

## 2020-07-22 NOTE — Telephone Encounter (Signed)
Appointment has been made with arnett 1100 on [id number]

## 2020-07-22 NOTE — Telephone Encounter (Signed)
Patient called in about a mump in rear end she feel ing clean her of a bowel movement

## 2020-07-25 ENCOUNTER — Encounter: Payer: Self-pay | Admitting: Family

## 2020-07-25 ENCOUNTER — Ambulatory Visit (INDEPENDENT_AMBULATORY_CARE_PROVIDER_SITE_OTHER): Payer: Medicare Other | Admitting: Family

## 2020-07-25 ENCOUNTER — Other Ambulatory Visit: Payer: Self-pay

## 2020-07-25 DIAGNOSIS — L819 Disorder of pigmentation, unspecified: Secondary | ICD-10-CM

## 2020-07-25 NOTE — Patient Instructions (Signed)
Concern for development of bedsore. Most important to relieve direct pressure. change position and keep moving as much as possible; stand up to relieve pressure if you can. Please look at area every couple of days and call us if you see that skin is broken down , bleeding, or discharge from area.   Try to find:  Medline - [id number] H Remedy Olivamine 4 Ounce Calazime Skin Protectant Paste Cream  Or   Coloplast Baza Protect Cream   Or   Desitin  Let us know how she is doing

## 2020-07-25 NOTE — Progress Notes (Signed)
Subjective:    Patient ID: Taylor Watson, female    DOB: 1918/11/23, 87 y.o.   MRN: [id number]  CC: JASLEN ADCOX is a 87 y.o. female who presents today for an acute visit.    HPI: Accompanied by daughter , Percival Spanish, whom lives with her and is home all day with patient.   Patient describes niece noticed bump near rectum a couple of days ago. They are not sure how long has been there. Otherwise patient feels in usual state of health.  Area is not draining. She denies fever, constipation, blood from rectum, dysuria, cp, sob, changes in mental status.   No medications tried for this.  Last BM yesterday  She reports urinary incontinence. At time,s she may not get out of bed to urinate and will use diaper. She wears diaper all day. She is continent of stool. .   No h/o bed sore.   She is ambulatory however spends most of her day sitting upright in chair.   No h/o DM       HISTORY:  Past Medical History:  Diagnosis Date  . Allergy   . Anemia   . Arrhythmia   . CHF (congestive heart failure) ([place])   . CVA (cerebral vascular accident) ([place])   . Depression   . GERD (gastroesophageal reflux disease)   . History of blood transfusion   . History of kidney stones   . Hx: UTI (urinary tract infection)   . Hypercholesterolemia   . Hypertension    Past Surgical History:  Procedure Laterality Date  . CHOLECYSTECTOMY  1984  . NECK LESION BIOPSY  2015   Followed by Dr. Richardson Landry   Family History  Problem Relation Age of Onset  . Cancer Mother        unknown type  . Hypertension Mother   . Cancer Father        unknown type  . Cancer Daughter   . Colon cancer Other        grandfather    Allergies: Dyazide [hydrochlorothiazide w-triamterene], Toprol xl [metoprolol tartrate], Monopril [fosinopril], and Verapamil Current Outpatient Medications on File Prior to Visit  Medication Sig Dispense Refill  . aspirin EC 81 MG EC tablet Take 1 tablet (81 mg total) by mouth  daily. 30 tablet 0  . furosemide (LASIX) 40 MG tablet Take 0.5 tablets (20 mg total) by mouth daily. 90 tablet 1  . sacubitril-valsartan (ENTRESTO) 49-51 MG Take 1 tablet by mouth 2 (two) times daily. (Patient taking differently: Take 0.5 tablets by mouth 2 (two) times daily.) 60 tablet 5   No current facility-administered medications on file prior to visit.    Social History   Tobacco Use  . Smoking status: Former Research scientist (life sciences)  . Smokeless tobacco: Former Systems developer    Types: Chew  Substance Use Topics  . Alcohol use: No    Alcohol/week: 0.0 standard drinks  . Drug use: No    Review of Systems  Constitutional: Negative for chills and fever.  Respiratory: Negative for cough.   Cardiovascular: Negative for chest pain, palpitations and leg swelling.  Gastrointestinal: Negative for blood in stool, constipation, nausea and vomiting.  Genitourinary: Negative for decreased urine volume, difficulty urinating, genital sores, pelvic pain and vaginal discharge.  Skin: Negative for rash and wound.  Psychiatric/Behavioral: Negative for confusion.      Objective:    BP 130/76 (BP Location: Left Arm, Patient Position: Sitting)   Ht 5' 4.02" (1.626 m)   Wt 101 lb  8 oz (46 kg)   BMI 17.41 kg/m  Wt Readings from Last 3 Encounters:  07/25/20 101 lb 8 oz (46 kg)  05/09/20 102 lb (46.3 kg)  03/25/20 107 lb (48.5 kg)     Physical Exam Vitals reviewed.  Constitutional:      Appearance: She is well-developed.  Eyes:     Conjunctiva/sclera: Conjunctivae normal.  Cardiovascular:     Rate and Rhythm: Normal rate and regular rhythm.     Pulses: Normal pulses.     Heart sounds: Normal heart sounds.  Pulmonary:     Effort: Pulmonary effort is normal.     Breath sounds: Normal breath sounds. No wheezing, rhonchi or rales.  Musculoskeletal:     Right lower leg: No edema.     Left lower leg: No edema.  Skin:    General: Skin is warm and dry.          Comments: Scaley, hyperpigmented area above  anus, over coccyx. Skin tags around anus. No painful or thrombosed hemorrhoids. Urine noted on hospital gown. No foul odor. No vaginal discharge appreciated on exam. No vesicular lesions. No macerated skin.   Neurological:     Mental Status: She is alert.  Psychiatric:        Speech: Speech normal.        Behavior: Behavior normal.        Thought Content: Thought content normal.        Assessment & Plan:      I am having [place] T. Cowley maintain her aspirin, furosemide, and sacubitril-valsartan.   No orders of the defined types were placed in this encounter.   Return precautions given.   Risks, benefits, and alternatives of the medications and treatment plan prescribed today were discussed, and patient expressed understanding.   Education regarding symptom management and diagnosis given to patient on AVS.  Continue to follow with [name], MD for routine health maintenance.   Taylor Watson and I agreed with plan.   Mable Paris, FNP

## 2020-07-25 NOTE — Assessment & Plan Note (Addendum)
No evidence of decubitus ulcer on exam. Skin is intact. Skin appearance with hyperpigmented skin over coccyx, raises my concern for her vulnerability to pressure, friction, shear, and moisture. Patient is also very thin, largely sedentary, incontinent of urine. Discussed prevention techniques by standing, repositioning hourly, application of barrier cream OTC, and keeping area as dry as possible. Advised to call if skin appeared was macerated or skin integrity was broken. I showed daughter area on buttocks so that she can remain vigilant. If were to progress, I  Advised to call me and I likely I would advise wound care consult for proper dressing.

## 2020-07-28 ENCOUNTER — Ambulatory Visit: Payer: Medicare Other | Admitting: Family

## 2020-07-29 ENCOUNTER — Ambulatory Visit: Payer: Medicare Other | Admitting: Family

## 2020-08-04 DIAGNOSIS — I10 Essential (primary) hypertension: Secondary | ICD-10-CM | POA: Diagnosis not present

## 2020-08-09 DIAGNOSIS — Z9181 History of falling: Secondary | ICD-10-CM | POA: Diagnosis not present

## 2020-08-09 DIAGNOSIS — I5031 Acute diastolic (congestive) heart failure: Secondary | ICD-10-CM | POA: Diagnosis not present

## 2020-08-09 DIAGNOSIS — J9611 Chronic respiratory failure with hypoxia: Secondary | ICD-10-CM | POA: Diagnosis not present

## 2020-08-09 DIAGNOSIS — Z8673 Personal history of transient ischemic attack (TIA), and cerebral infarction without residual deficits: Secondary | ICD-10-CM | POA: Diagnosis not present

## 2020-08-09 DIAGNOSIS — J9601 Acute respiratory failure with hypoxia: Secondary | ICD-10-CM | POA: Diagnosis not present

## 2020-08-09 NOTE — Progress Notes (Signed)
Patient ID: Taylor Watson, female    DOB: 1918-10-25, 87 y.o.   MRN: [id number]  HPI  Taylor Watson is a 87 y/o female with a history of hyperlipidemia, HTN, stroke, anemia, GERD, depression, previous tobacco use and chronic heart failure.   Echo report from 03/19/20 reviewed and showed an EF of 60-65% along with mild LVH and mildly elevated PA pressure. Echo report from 07/19/19 reviewed and showed an EF of 55-60% with moderate LVH, mild MR and severely elevated PA pressure.   Admitted 03/19/20 due to AMS and UTI. Cardiology consult obtained. Antibiotics given. Discharged the following day.     She presents today for a follow-up visit with a chief complaint of moderate shortness of breath upon minimal exertion. She describes this as chronic in nature having been present for several years. She has associated fatigue and light-headedness along with this. She denies any difficulty sleeping, abdominal distention, palpitations, pedal edema, chest pain, cough or weight gain.   Daughter that is present with her says that patient does not elevate her legs much during the day.   Past Medical History:  Diagnosis Date  . Allergy   . Anemia   . Arrhythmia   . CHF (congestive heart failure) ([place])   . CVA (cerebral vascular accident) ([place])   . Depression   . GERD (gastroesophageal reflux disease)   . History of blood transfusion   . History of kidney stones   . Hx: UTI (urinary tract infection)   . Hypercholesterolemia   . Hypertension    Past Surgical History:  Procedure Laterality Date  . CHOLECYSTECTOMY  1984  . NECK LESION BIOPSY  2015   Followed by Dr. Richardson Landry   Family History  Problem Relation Age of Onset  . Cancer Mother        unknown type  . Hypertension Mother   . Cancer Father        unknown type  . Cancer Daughter   . Colon cancer Other        grandfather   Social History   Tobacco Use  . Smoking status: Former Research scientist (life sciences)  . Smokeless tobacco: Former Systems developer    Types:  Chew  Substance Use Topics  . Alcohol use: No    Alcohol/week: 0.0 standard drinks   Allergies  Allergen Reactions  . Dyazide [Hydrochlorothiazide W-Triamterene] Other (See Comments)    Unknown reaction  . Toprol Xl [Metoprolol Tartrate] Other (See Comments)    unknown  . Monopril [Fosinopril] Cough  . Verapamil Other (See Comments)    headache   Prior to Admission medications   Medication Sig Start Date End Date Taking? Authorizing Provider  aspirin EC 81 MG EC tablet Take 1 tablet (81 mg total) by mouth daily. 05/20/19  Yes Lavina Hamman, MD  furosemide (LASIX) 40 MG tablet Take 0.5 tablets (20 mg total) by mouth daily. 09/13/19 09/12/20 Yes [name], MD  sacubitril-valsartan (ENTRESTO) 49-51 MG Take 1 tablet by mouth 2 (two) times daily. Patient taking differently: Take 0.5 tablets by mouth 2 (two) times daily. 11/29/19  Yes Alisa Graff, FNP    Review of Systems  Constitutional: Positive for fatigue. Negative for appetite change.  HENT: Negative for congestion, postnasal drip and sore throat.   Eyes: Negative.   Respiratory: Positive for shortness of breath (with little exertion). Negative for cough.   Cardiovascular: Negative for chest pain, palpitations and leg swelling.  Gastrointestinal: Negative for abdominal distention and abdominal pain.  Endocrine: Negative.  Musculoskeletal: Negative for back pain and neck pain.  Skin: Negative.   Allergic/Immunologic: Negative.   Neurological: Positive for light-headedness. Negative for dizziness.  Hematological: Negative for adenopathy. Does not bruise/bleed easily.  Psychiatric/Behavioral: Negative for dysphoric mood and sleep disturbance (sleeping on 1 pillow). The patient is not nervous/anxious.    Vitals:   08/11/20 0919  BP: (!) 157/79  Pulse: 97  Resp: 16  SpO2: 90%  Weight: 106 lb (48.1 kg)  Height: 5\' 5"  (1.651 m)   Wt Readings from Last 3 Encounters:  08/11/20 106 lb (48.1 kg)  07/25/20 101 lb 8 oz (46  kg)  05/09/20 102 lb (46.3 kg)   Lab Results  Component Value Date   CREATININE 0.90 03/25/2020   CREATININE 0.95 03/20/2020   CREATININE 0.98 03/19/2020    Physical Exam Vitals and nursing note reviewed. Exam conducted with a chaperone present (daughter).  Constitutional:      Appearance: Normal appearance. She is well-developed.  HENT:     Head: Normocephalic and atraumatic.  Neck:     Vascular: No JVD.  Cardiovascular:     Rate and Rhythm: Normal rate and regular rhythm.  Pulmonary:     Effort: Pulmonary effort is normal. No respiratory distress.     Breath sounds: No wheezing or rales.  Abdominal:     General: Abdomen is flat. There is no distension.     Palpations: Abdomen is soft.  Musculoskeletal:        General: No tenderness.     Cervical back: Normal range of motion and neck supple.     Right lower leg: No tenderness. Edema (trace pitting) present.     Left lower leg: No tenderness. Edema (trace pitting) present.  Skin:    General: Skin is warm and dry.  Neurological:     General: No focal deficit present.     Mental Status: She is alert and oriented to person, place, and time.  Psychiatric:        Mood and Affect: Mood normal.        Behavior: Behavior normal.    Assessment & Plan:  1: Chronic heart failure with preserved ejection fraction and structural changes of mild LVH- - NYHA class III - euvolemic today - weighing daily; reminded to call for an overnight weight gain of >2 pounds or a weekly weight gain of >5 pounds - weight up 6 pounds from last visit here 7 months ago; says that her appetite has greatly improved - patient says that she does add salt to some foods and her daughter says that she doesn't cook food with salt; encouraged her to not add any salt to her food - wearing oxygen as needed at home - encouraged her to elevate her legs when sitting for long periods of time - BNP 03/19/20 was 324.4 - not interested in covid or flu vaccines  2:  HTN- - BP mildly elevated today; will not adjust meds today - saw PCP (Arnett) 07/25/20 - BMP from 03/25/20 reviewed and showed sodium 134, potassium 4.0, creatinine 0.90 and GFR 52.93   Medication bottles reviewed.   Return in 4 months or sooner for any questions/problems before then.

## 2020-08-11 ENCOUNTER — Other Ambulatory Visit: Payer: Self-pay

## 2020-08-11 ENCOUNTER — Ambulatory Visit: Payer: Medicare Other | Attending: Family | Admitting: Family

## 2020-08-11 ENCOUNTER — Encounter: Payer: Self-pay | Admitting: Family

## 2020-08-11 VITALS — BP 157/79 | HR 97 | Resp 16 | Ht 65.0 in | Wt 106.0 lb

## 2020-08-11 DIAGNOSIS — I5032 Chronic diastolic (congestive) heart failure: Secondary | ICD-10-CM

## 2020-08-11 DIAGNOSIS — K219 Gastro-esophageal reflux disease without esophagitis: Secondary | ICD-10-CM | POA: Insufficient documentation

## 2020-08-11 DIAGNOSIS — E785 Hyperlipidemia, unspecified: Secondary | ICD-10-CM | POA: Diagnosis not present

## 2020-08-11 DIAGNOSIS — F32A Depression, unspecified: Secondary | ICD-10-CM | POA: Diagnosis not present

## 2020-08-11 DIAGNOSIS — Z8673 Personal history of transient ischemic attack (TIA), and cerebral infarction without residual deficits: Secondary | ICD-10-CM | POA: Diagnosis not present

## 2020-08-11 DIAGNOSIS — Z7982 Long term (current) use of aspirin: Secondary | ICD-10-CM | POA: Insufficient documentation

## 2020-08-11 DIAGNOSIS — Z87891 Personal history of nicotine dependence: Secondary | ICD-10-CM | POA: Insufficient documentation

## 2020-08-11 DIAGNOSIS — Z79899 Other long term (current) drug therapy: Secondary | ICD-10-CM | POA: Insufficient documentation

## 2020-08-11 DIAGNOSIS — I110 Hypertensive heart disease with heart failure: Secondary | ICD-10-CM | POA: Insufficient documentation

## 2020-08-11 DIAGNOSIS — I10 Essential (primary) hypertension: Secondary | ICD-10-CM

## 2020-08-11 DIAGNOSIS — D649 Anemia, unspecified: Secondary | ICD-10-CM | POA: Insufficient documentation

## 2020-08-11 NOTE — Patient Instructions (Signed)
Continue weighing daily and call for an overnight weight gain of > 2 pounds or a weekly weight gain of >5 pounds. 

## 2020-08-15 DIAGNOSIS — R0602 Shortness of breath: Secondary | ICD-10-CM | POA: Diagnosis not present

## 2020-08-26 ENCOUNTER — Ambulatory Visit: Payer: Self-pay | Admitting: Podiatry

## 2020-09-03 DIAGNOSIS — I10 Essential (primary) hypertension: Secondary | ICD-10-CM | POA: Diagnosis not present

## 2020-09-08 DIAGNOSIS — Z9181 History of falling: Secondary | ICD-10-CM | POA: Diagnosis not present

## 2020-09-08 DIAGNOSIS — I5031 Acute diastolic (congestive) heart failure: Secondary | ICD-10-CM | POA: Diagnosis not present

## 2020-09-08 DIAGNOSIS — J9601 Acute respiratory failure with hypoxia: Secondary | ICD-10-CM | POA: Diagnosis not present

## 2020-09-08 DIAGNOSIS — Z8673 Personal history of transient ischemic attack (TIA), and cerebral infarction without residual deficits: Secondary | ICD-10-CM | POA: Diagnosis not present

## 2020-09-08 DIAGNOSIS — J9611 Chronic respiratory failure with hypoxia: Secondary | ICD-10-CM | POA: Diagnosis not present

## 2020-09-14 DIAGNOSIS — R0602 Shortness of breath: Secondary | ICD-10-CM | POA: Diagnosis not present

## 2020-09-15 ENCOUNTER — Telehealth: Payer: Self-pay | Admitting: Internal Medicine

## 2020-09-15 NOTE — Telephone Encounter (Signed)
[employer] called in about patient order for incontinence supply that was faxed on 5-12

## 2020-09-16 NOTE — Telephone Encounter (Signed)
OV is not needed. Just signed form

## 2020-09-16 NOTE — Telephone Encounter (Signed)
I do not have this form, just wanted to make sure you did not have it before having them resend

## 2020-09-16 NOTE — Telephone Encounter (Signed)
Form in box.  Do they need an office visit to have documented need or can I just sign.  Need to clarify what is needed

## 2020-09-16 NOTE — Telephone Encounter (Signed)
Faxed to [employer]

## 2020-09-23 NOTE — Telephone Encounter (Signed)
Kallie from [employer] called in regards to paperwork that was faxed over on the 17th to inform that they also needed the 2nd page filled out and signed not just the 1st as they only received the 1st one signed and filled out.

## 2020-09-24 NOTE — Telephone Encounter (Signed)
Spoke with Annie Main to clarify what is needed. OV note needs to be addended within the last year stating medical necessity for urinary incontinence supplies. Does not have to be face to face visit. Just has to be within in the last 12 months. Will send over note. Below is direct contact info for rep that I spoke with. [employer] is aware that Dr Nicki Reaper and myself are not in the office today to complete  Taylor Watson  P:[phone]

## 2020-09-25 NOTE — Telephone Encounter (Signed)
Per note, need second page.  Ok to schedule telephone visit or virtual visit to discuss incontinence.

## 2020-09-29 ENCOUNTER — Other Ambulatory Visit: Payer: Self-pay | Admitting: Family

## 2020-10-01 NOTE — Telephone Encounter (Signed)
Pt has upcoming appt on 7/1 but while on the phone with her daughter, daughter requested to have an appt with a provider in our office to look at her ankles because they have been swollen. They needed an appt later in the day. Scheduled for Friday with Joycelyn Schmid. Confirmed no SOB, trouble breathing, etc.

## 2020-10-02 NOTE — Telephone Encounter (Signed)
FYI

## 2020-10-03 ENCOUNTER — Encounter: Payer: Self-pay | Admitting: Family

## 2020-10-03 ENCOUNTER — Ambulatory Visit (INDEPENDENT_AMBULATORY_CARE_PROVIDER_SITE_OTHER): Payer: Medicare Other | Admitting: Family

## 2020-10-03 ENCOUNTER — Other Ambulatory Visit: Payer: Self-pay

## 2020-10-03 VITALS — BP 136/70 | HR 56 | Temp 98.8°F | Ht 65.0 in | Wt 104.5 lb

## 2020-10-03 DIAGNOSIS — H1032 Unspecified acute conjunctivitis, left eye: Secondary | ICD-10-CM

## 2020-10-03 DIAGNOSIS — R0981 Nasal congestion: Secondary | ICD-10-CM

## 2020-10-03 DIAGNOSIS — M7989 Other specified soft tissue disorders: Secondary | ICD-10-CM | POA: Diagnosis not present

## 2020-10-03 DIAGNOSIS — I10 Essential (primary) hypertension: Secondary | ICD-10-CM | POA: Diagnosis not present

## 2020-10-03 DIAGNOSIS — H109 Unspecified conjunctivitis: Secondary | ICD-10-CM | POA: Insufficient documentation

## 2020-10-03 MED ORDER — ERYTHROMYCIN 5 MG/GM OP OINT: TOPICAL_OINTMENT | OPHTHALMIC | 0 refills | Status: DC

## 2020-10-03 NOTE — Patient Instructions (Signed)
Labs today and again Monday or Tuesday at [facility].  Use antibiotic ointment for your eye as directed and call your eye doctor.  Increase your Lasix (Furosemide) to 40 mg daily for 3 days and then decrease back to 20 mg daily.   Call us with any concerns.

## 2020-10-03 NOTE — Assessment & Plan Note (Signed)
No acute symptoms of CHF exacerbation. Weight stable. Advised 3 days of increased lasix 40mg  and then resume 20mg . Counseled on how to elevate properly and wearing compression stockings. Labs today and again in 3-4 days at [facility].

## 2020-10-03 NOTE — Assessment & Plan Note (Addendum)
Symptoms consistent with bacterial  versus allergic conjunctivitis. Advised to rinse out with saline solution as I didn't not appreciate eyelash in eye. If feeling of pain with blinking continues, I have advised to call eye doctor for more in depth evaluation. However symptom of grittiness may be related to allergic conjunctivitis. Start erythromycin ointment for suspected bacterial conjunctivitis. Pending covid test.

## 2020-10-03 NOTE — Progress Notes (Signed)
Subjective:    Patient ID: Taylor Watson, female    DOB: 01/24/1919, 87 y.o.   MRN: [id number]  CC: Taylor Watson is a 87 y.o. female who presents today for an acute visit.    HPI: Accompanied by daughter  Complains bilateral leg swelling, worse over the past 2 weeks. Improves with legs elevated.  Sitting most of the day. She is not wearing compression stocking.   No falls or wounds. Using walker at home.  No cp, fever, sob, orthopnea, cough.   She takes lasix 20mg  everyday  H/o CHF Compliant with enstresto Follows with Darylene [facility] with [facility] 03/2020 LVEF 60-65%, MV degenerative, AVR GFR 52.93  Six months ago H/o atrial fib. Compliant with asa 81mg   She has complains of left eye pain with blinking, discharge which started yesterday. Pain with blinking, otherwise no pain. Feels like something is in her eye such as a hair. Gritty.  No sneezing. Endorses Nasal congestion.  No contacts or glasses.  No vision loss, HA. Fever, cough.  She follows with [facility] art in [place]. She is not vaccinated for covid.     HISTORY:  Past Medical History:  Diagnosis Date  . Allergy   . Anemia   . Arrhythmia   . CHF (congestive heart failure) ([place])   . CVA (cerebral vascular accident) ([place])   . Depression   . GERD (gastroesophageal reflux disease)   . History of blood transfusion   . History of kidney stones   . Hx: UTI (urinary tract infection)   . Hypercholesterolemia   . Hypertension    Past Surgical History:  Procedure Laterality Date  . CHOLECYSTECTOMY  1984  . NECK LESION BIOPSY  2015   Followed by Dr. Richardson Landry   Family History  Problem Relation Age of Onset  . Cancer Mother        unknown type  . Hypertension Mother   . Cancer Father        unknown type  . Cancer Daughter   . Colon cancer Other        grandfather    Allergies: Dyazide [hydrochlorothiazide w-triamterene], Toprol xl [metoprolol tartrate], Monopril  [fosinopril], and Verapamil Current Outpatient Medications on File Prior to Visit  Medication Sig Dispense Refill  . aspirin EC 81 MG EC tablet Take 1 tablet (81 mg total) by mouth daily. 30 tablet 0  . furosemide (LASIX) 40 MG tablet Take 0.5 tablets (20 mg total) by mouth daily. 90 tablet 1  . sacubitril-valsartan (ENTRESTO) 49-51 MG Take 0.5 tablets by mouth 2 (two) times daily. 60 tablet 5   No current facility-administered medications on file prior to visit.    Social History   Tobacco Use  . Smoking status: Former Research scientist (life sciences)  . Smokeless tobacco: Former Systems developer    Types: Chew  Substance Use Topics  . Alcohol use: No    Alcohol/week: 0.0 standard drinks  . Drug use: No    Review of Systems  Constitutional: Negative for chills and fever.  HENT: Positive for congestion.   Eyes: Positive for pain and discharge. Negative for photophobia, itching and visual disturbance.  Respiratory: Negative for cough.   Cardiovascular: Positive for leg swelling. Negative for chest pain and palpitations.  Gastrointestinal: Negative for nausea and vomiting.      Objective:    BP 136/70 (BP Location: Left Arm, Patient Position: Sitting, Cuff Size: Small)   Pulse (!) 56   Temp 98.8 F (37.1 C) (Oral)  Ht 5\' 5"  (1.651 m)   Wt 104 lb 8 oz (47.4 kg)   BMI 17.39 kg/m  Wt Readings from Last 3 Encounters:  10/03/20 104 lb 8 oz (47.4 kg)  08/11/20 106 lb (48.1 kg)  07/25/20 101 lb 8 oz (46 kg)     Physical Exam Vitals reviewed.  Constitutional:      Appearance: She is well-developed.  HENT:     Head: Normocephalic and atraumatic.     Right Ear: Hearing, tympanic membrane, ear canal and external ear normal. No decreased hearing noted. No drainage, swelling or tenderness. No middle ear effusion. No foreign body. Tympanic membrane is not erythematous or bulging.     Left Ear: Hearing, tympanic membrane, ear canal and external ear normal. No decreased hearing noted. No drainage, swelling or  tenderness.  No middle ear effusion. No foreign body. Tympanic membrane is not erythematous or bulging.     Nose: No rhinorrhea.     Right Sinus: No maxillary sinus tenderness or frontal sinus tenderness.     Left Sinus: No maxillary sinus tenderness or frontal sinus tenderness.     Mouth/Throat:     Pharynx: Uvula midline. No oropharyngeal exudate or posterior oropharyngeal erythema.     Tonsils: No tonsillar abscesses.  Eyes:     General: Lids are everted, no foreign bodies appreciated. No scleral icterus.       Right eye: No discharge.        Left eye: Discharge present.No hordeolum.     Conjunctiva/sclera:     Right eye: Right conjunctiva is not injected. No hemorrhage.    Left eye: Left conjunctiva is not injected. No hemorrhage.    Pupils: Pupils are equal, round, and reactive to light.     Comments: No external eye lesions. Surrounding skin intact.   Left eye:   No injection of the conjunctiva. No white spots, opacity, or foreign body appreciated.  No ciliary flush surrounding iris.   No photophobia or eye pain appreciated during exam.  Clear scant discharge from left outer eye seen.   Cardiovascular:     Rate and Rhythm: Regular rhythm.     Pulses: Normal pulses.     Heart sounds: Normal heart sounds.  Pulmonary:     Effort: Pulmonary effort is normal.     Breath sounds: Normal breath sounds. No wheezing, rhonchi or rales.  Lymphadenopathy:     Head:     Right side of head: No submental, submandibular, tonsillar, preauricular, posterior auricular or occipital adenopathy.     Left side of head: No submental, submandibular, tonsillar, preauricular, posterior auricular or occipital adenopathy.     Cervical: No cervical adenopathy.  Skin:    General: Skin is warm and dry.  Neurological:     Mental Status: She is alert.  Psychiatric:        Speech: Speech normal.        Behavior: Behavior normal.        Thought Content: Thought content normal.        Assessment &  Plan:    Problem List Items Addressed This Visit      Cardiovascular and Mediastinum   Essential hypertension, benign   Relevant Orders   Basic metabolic panel     Other   Conjunctivitis    Symptoms consistent with bacterial  versus allergic conjunctivitis. Advised to rinse out with saline solution as I didn't not appreciate eyelash in eye. If feeling of pain with blinking continues, I have advised  to call eye doctor for more in depth evaluation. However symptom of grittiness may be related to allergic conjunctivitis. Start erythromycin ointment for suspected bacterial conjunctivitis. Pending covid test.       Leg swelling    No acute symptoms of CHF exacerbation. Weight stable. Advised 3 days of increased lasix 40mg  and then resume 20mg . Counseled on how to elevate properly and wearing compression stockings. Labs today and again in 3-4 days at [facility].        Other Visit Diagnoses    Nasal congestion    -  Primary   Relevant Orders   COVID-19, Flu A+B and RSV       I am having Shaquila T. Gonce start on erythromycin. I am also having her maintain her aspirin, furosemide, and Entresto.   Meds ordered this encounter  Medications  . erythromycin ophthalmic ointment    Sig: Use one half inch four times daily to affected eye (s) x 7 days.    Dispense:  3.5 g    Refill:  0    Order Specific Question:   Supervising Provider    Answer:   Crecencio Mc [[id number]]    Return precautions given.   Risks, benefits, and alternatives of the medications and treatment plan prescribed today were discussed, and patient expressed understanding.   Education regarding symptom management and diagnosis given to patient on AVS.  Continue to follow with [name], MD for routine health maintenance.   Taylor Watson and I agreed with plan.   Mable Paris, FNP

## 2020-10-04 DIAGNOSIS — I10 Essential (primary) hypertension: Secondary | ICD-10-CM | POA: Diagnosis not present

## 2020-10-05 LAB — COVID-19, FLU A+B AND RSV
Influenza A, NAA: NOT DETECTED
Influenza B, NAA: NOT DETECTED
RSV, NAA: NOT DETECTED
SARS-CoV-2, NAA: NOT DETECTED

## 2020-10-06 ENCOUNTER — Telehealth: Payer: Self-pay | Admitting: Family

## 2020-10-06 DIAGNOSIS — E876 Hypokalemia: Secondary | ICD-10-CM

## 2020-10-06 LAB — BASIC METABOLIC PANEL
BUN: 19 mg/dL (ref 6–23)
CO2: 36 mEq/L — ABNORMAL HIGH (ref 19–32)
Calcium: 9.3 mg/dL (ref 8.4–10.5)
Chloride: 97 mEq/L (ref 96–112)
Creatinine, Ser: 0.93 mg/dL (ref 0.40–1.20)
GFR: 50.7 mL/min — ABNORMAL LOW (ref >60.00)
Glucose, Bld: 67 mg/dL — ABNORMAL LOW (ref 70–99)
Potassium: 3 mEq/L — ABNORMAL LOW (ref 3.5–5.1)
Sodium: 146 mEq/L — ABNORMAL HIGH (ref 135–145)

## 2020-10-06 MED ORDER — POTASSIUM CHLORIDE CRYS ER 10 MEQ PO TBCR: EXTENDED_RELEASE_TABLET | ORAL | 1 refills | Status: DC

## 2020-10-06 NOTE — Telephone Encounter (Signed)
Tried to call both [phone] and  Going straight to voicemail. Unable to leave VM  Please keep calling  Pt, daughter  Patient's potassium is very low and needs to be replaced. I have sent in potassium for her start  sch bmp lab Thursday or Friday

## 2020-10-07 ENCOUNTER — Other Ambulatory Visit: Payer: Self-pay

## 2020-10-07 ENCOUNTER — Other Ambulatory Visit
Admission: RE | Admit: 2020-10-07 | Discharge: 2020-10-07 | Disposition: A | Discharge status: Home / Self Care | Payer: Medicare Other | Attending: Family | Admitting: Family

## 2020-10-07 DIAGNOSIS — E876 Hypokalemia: Secondary | ICD-10-CM

## 2020-10-07 DIAGNOSIS — D649 Anemia, unspecified: Secondary | ICD-10-CM | POA: Diagnosis not present

## 2020-10-07 DIAGNOSIS — I10 Essential (primary) hypertension: Secondary | ICD-10-CM | POA: Diagnosis not present

## 2020-10-07 LAB — BASIC METABOLIC PANEL
Anion gap: 11 (ref 5–15)
BUN: 18 mg/dL (ref 8–23)
CO2: 35 mmol/L — ABNORMAL HIGH (ref 22–32)
Calcium: 8.9 mg/dL (ref 8.9–10.3)
Chloride: 96 mmol/L — ABNORMAL LOW (ref 98–111)
Creatinine, Ser: 1.18 mg/dL — ABNORMAL HIGH (ref 0.44–1.00)
GFR, Estimated: 41 mL/min — ABNORMAL LOW (ref >60)
Glucose, Bld: 108 mg/dL — ABNORMAL HIGH (ref 70–99)
Potassium: 2.8 mmol/L — ABNORMAL LOW (ref 3.5–5.1)
Sodium: 142 mmol/L (ref 135–145)

## 2020-10-07 LAB — TSH: TSH: 3.811 u[IU]/mL (ref 0.350–4.500)

## 2020-10-07 LAB — HEPATIC FUNCTION PANEL
ALT: 12 U/L (ref 0–44)
AST: 20 U/L (ref 15–41)
Albumin: 3.6 g/dL (ref 3.5–5.0)
Alkaline Phosphatase: 67 U/L (ref 38–126)
Bilirubin, Direct: 0.1 mg/dL (ref 0.0–0.2)
Indirect Bilirubin: 0.8 mg/dL (ref 0.3–0.9)
Total Bilirubin: 0.9 mg/dL (ref 0.3–1.2)
Total Protein: 6.9 g/dL (ref 6.5–8.1)

## 2020-10-07 LAB — CBC WITH DIFFERENTIAL/PLATELET
Abs Immature Granulocytes: 0.02 10*3/uL (ref 0.00–0.07)
Basophils Absolute: 0 10*3/uL (ref 0.0–0.1)
Basophils Relative: 0 %
Eosinophils Absolute: 0.1 10*3/uL (ref 0.0–0.5)
Eosinophils Relative: 2 %
HCT: 41.8 % (ref 36.0–46.0)
Hemoglobin: 13.1 g/dL (ref 12.0–15.0)
Immature Granulocytes: 0 %
Lymphocytes Relative: 12 %
Lymphs Abs: 0.7 10*3/uL (ref 0.7–4.0)
MCH: 29.2 pg (ref 26.0–34.0)
MCHC: 31.3 g/dL (ref 30.0–36.0)
MCV: 93.1 fL (ref 80.0–100.0)
Monocytes Absolute: 0.4 10*3/uL (ref 0.1–1.0)
Monocytes Relative: 6 %
Neutro Abs: 4.6 10*3/uL (ref 1.7–7.7)
Neutrophils Relative %: 80 %
Platelets: 219 10*3/uL (ref 150–400)
RBC: 4.49 MIL/uL (ref 3.87–5.11)
RDW: 13.8 % (ref 11.5–15.5)
WBC: 5.8 10*3/uL (ref 4.0–10.5)
nRBC: 0 % (ref 0.0–0.2)

## 2020-10-07 LAB — VITAMIN B12: Vitamin B-12: 335 pg/mL (ref 180–914)

## 2020-10-07 NOTE — Telephone Encounter (Signed)
I spoke with patient's daughter. Potassium sent & she is scheduled for BMP on Friday.

## 2020-10-09 DIAGNOSIS — J9601 Acute respiratory failure with hypoxia: Secondary | ICD-10-CM | POA: Diagnosis not present

## 2020-10-09 DIAGNOSIS — Z8673 Personal history of transient ischemic attack (TIA), and cerebral infarction without residual deficits: Secondary | ICD-10-CM | POA: Diagnosis not present

## 2020-10-09 DIAGNOSIS — Z9181 History of falling: Secondary | ICD-10-CM | POA: Diagnosis not present

## 2020-10-09 DIAGNOSIS — J9611 Chronic respiratory failure with hypoxia: Secondary | ICD-10-CM | POA: Diagnosis not present

## 2020-10-09 DIAGNOSIS — I5031 Acute diastolic (congestive) heart failure: Secondary | ICD-10-CM | POA: Diagnosis not present

## 2020-10-10 ENCOUNTER — Other Ambulatory Visit: Payer: Self-pay

## 2020-10-10 ENCOUNTER — Other Ambulatory Visit: Payer: Medicare Other

## 2020-10-10 ENCOUNTER — Other Ambulatory Visit (INDEPENDENT_AMBULATORY_CARE_PROVIDER_SITE_OTHER): Payer: Medicare Other

## 2020-10-10 DIAGNOSIS — E876 Hypokalemia: Secondary | ICD-10-CM | POA: Diagnosis not present

## 2020-10-10 LAB — BASIC METABOLIC PANEL
BUN: 19 mg/dL (ref 6–23)
CO2: 36 mEq/L — ABNORMAL HIGH (ref 19–32)
Calcium: 9.2 mg/dL (ref 8.4–10.5)
Chloride: 99 mEq/L (ref 96–112)
Creatinine, Ser: 0.91 mg/dL (ref 0.40–1.20)
GFR: 52.03 mL/min — ABNORMAL LOW (ref >60.00)
Glucose, Bld: 77 mg/dL (ref 70–99)
Potassium: 4.2 mEq/L (ref 3.5–5.1)
Sodium: 145 mEq/L (ref 135–145)

## 2020-10-14 ENCOUNTER — Other Ambulatory Visit: Payer: Self-pay | Admitting: Family

## 2020-10-15 DIAGNOSIS — R0602 Shortness of breath: Secondary | ICD-10-CM | POA: Diagnosis not present

## 2020-10-20 ENCOUNTER — Telehealth: Payer: Self-pay | Admitting: Internal Medicine

## 2020-10-20 ENCOUNTER — Other Ambulatory Visit: Payer: Self-pay

## 2020-10-20 NOTE — Telephone Encounter (Signed)
Patient's daughter called and would like to know if patient should still be taking her potassium chloride (KLOR-CON) 10 MEQ tablet.

## 2020-10-20 NOTE — Telephone Encounter (Signed)
Advised per Dr Nicki Reaper that she was advised to continue potassium once daily after taking the 4 days of BID dose.

## 2020-10-29 ENCOUNTER — Other Ambulatory Visit: Payer: Self-pay | Admitting: Family

## 2020-10-29 DIAGNOSIS — E876 Hypokalemia: Secondary | ICD-10-CM

## 2020-10-29 NOTE — Telephone Encounter (Signed)
Called pt. Mailbox was full. No vm left.

## 2020-10-29 NOTE — Telephone Encounter (Signed)
Please call and confirm she is taking potassium.  Just need to confirm how she has been taking.

## 2020-10-31 ENCOUNTER — Other Ambulatory Visit: Payer: Self-pay

## 2020-10-31 ENCOUNTER — Ambulatory Visit (INDEPENDENT_AMBULATORY_CARE_PROVIDER_SITE_OTHER): Payer: Medicare Other | Admitting: Internal Medicine

## 2020-10-31 ENCOUNTER — Encounter: Payer: Self-pay | Admitting: Internal Medicine

## 2020-10-31 VITALS — BP 110/80 | HR 40 | Temp 95.3°F | Ht 65.0 in | Wt 103.2 lb

## 2020-10-31 DIAGNOSIS — K625 Hemorrhage of anus and rectum: Secondary | ICD-10-CM

## 2020-10-31 DIAGNOSIS — I120 Hypertensive chronic kidney disease with stage 5 chronic kidney disease or end stage renal disease: Secondary | ICD-10-CM

## 2020-10-31 DIAGNOSIS — E876 Hypokalemia: Secondary | ICD-10-CM

## 2020-10-31 DIAGNOSIS — I4891 Unspecified atrial fibrillation: Secondary | ICD-10-CM

## 2020-10-31 DIAGNOSIS — N185 Chronic kidney disease, stage 5: Secondary | ICD-10-CM

## 2020-10-31 DIAGNOSIS — R32 Unspecified urinary incontinence: Secondary | ICD-10-CM

## 2020-10-31 DIAGNOSIS — R0602 Shortness of breath: Secondary | ICD-10-CM

## 2020-10-31 DIAGNOSIS — I10 Essential (primary) hypertension: Secondary | ICD-10-CM | POA: Diagnosis not present

## 2020-10-31 DIAGNOSIS — F320 Major depressive disorder, single episode, mild: Secondary | ICD-10-CM

## 2020-10-31 DIAGNOSIS — D3610 Benign neoplasm of peripheral nerves and autonomic nervous system, unspecified: Secondary | ICD-10-CM

## 2020-10-31 DIAGNOSIS — I5032 Chronic diastolic (congestive) heart failure: Secondary | ICD-10-CM | POA: Diagnosis not present

## 2020-10-31 DIAGNOSIS — Z8673 Personal history of transient ischemic attack (TIA), and cerebral infarction without residual deficits: Secondary | ICD-10-CM | POA: Diagnosis not present

## 2020-10-31 DIAGNOSIS — D649 Anemia, unspecified: Secondary | ICD-10-CM | POA: Diagnosis not present

## 2020-10-31 DIAGNOSIS — J9601 Acute respiratory failure with hypoxia: Secondary | ICD-10-CM | POA: Diagnosis not present

## 2020-10-31 DIAGNOSIS — F32A Depression, unspecified: Secondary | ICD-10-CM

## 2020-10-31 DIAGNOSIS — I700 Atherosclerosis of aorta: Secondary | ICD-10-CM

## 2020-10-31 DIAGNOSIS — N1831 Chronic kidney disease, stage 3a: Secondary | ICD-10-CM

## 2020-10-31 DIAGNOSIS — E7800 Pure hypercholesterolemia, unspecified: Secondary | ICD-10-CM | POA: Diagnosis not present

## 2020-10-31 DIAGNOSIS — J449 Chronic obstructive pulmonary disease, unspecified: Secondary | ICD-10-CM

## 2020-10-31 LAB — BASIC METABOLIC PANEL
BUN: 11 mg/dL (ref 6–23)
CO2: 33 mEq/L — ABNORMAL HIGH (ref 19–32)
Calcium: 9.3 mg/dL (ref 8.4–10.5)
Chloride: 96 mEq/L (ref 96–112)
Creatinine, Ser: 0.81 mg/dL (ref 0.40–1.20)
GFR: 59.81 mL/min — ABNORMAL LOW (ref >60.00)
Glucose, Bld: 74 mg/dL (ref 70–99)
Potassium: 3.8 mEq/L (ref 3.5–5.1)
Sodium: 138 mEq/L (ref 135–145)

## 2020-10-31 LAB — CBC WITH DIFFERENTIAL/PLATELET
Basophils Absolute: 0 10*3/uL (ref 0.0–0.1)
Basophils Relative: 0.6 % (ref 0.0–3.0)
Eosinophils Absolute: 0 10*3/uL (ref 0.0–0.7)
Eosinophils Relative: 1.3 % (ref 0.0–5.0)
HCT: 40.3 % (ref 36.0–46.0)
Hemoglobin: 13.1 g/dL (ref 12.0–15.0)
Lymphocytes Relative: 26 % (ref 12.0–46.0)
Lymphs Abs: 0.9 10*3/uL (ref 0.7–4.0)
MCHC: 32.4 g/dL (ref 30.0–36.0)
MCV: 89.8 fl (ref 78.0–100.0)
Monocytes Absolute: 0.3 10*3/uL (ref 0.1–1.0)
Monocytes Relative: 7.1 % (ref 3.0–12.0)
Neutro Abs: 2.3 10*3/uL (ref 1.4–7.7)
Neutrophils Relative %: 65 % (ref 43.0–77.0)
Platelets: 263 10*3/uL (ref 150.0–400.0)
RBC: 4.49 Mil/uL (ref 3.87–5.11)
RDW: 15.3 % (ref 11.5–15.5)
WBC: 3.6 10*3/uL — ABNORMAL LOW (ref 4.0–10.5)

## 2020-10-31 LAB — FERRITIN: Ferritin: 94.5 ng/mL (ref 10.0–291.0)

## 2020-10-31 MED ORDER — POTASSIUM CHLORIDE ER 10 MEQ PO TBCR
10.0000 meq | EXTENDED_RELEASE_TABLET | Freq: Every day | ORAL | 1 refills | Status: DC
Start: 2020-10-31 — End: 2021-01-06

## 2020-10-31 MED ORDER — HYDROCORTISONE ACETATE 25 MG RE SUPP: 25.0000 mg | Freq: Two times a day (BID) | RECTAL | 0 refills | Status: DC

## 2020-10-31 NOTE — Progress Notes (Addendum)
Patient ID: Taylor Watson, female   DOB: 1919/01/02, 87 y.o.   MRN: [id number]   Subjective:    Patient ID: Taylor Watson, female    DOB: 1918-06-25, 87 y.o.   MRN: [id number]  HPI This visit occurred during the SARS-CoV-2 public health emergency.  Safety protocols were in place, including screening questions prior to the visit, additional usage of staff PPE, and extensive cleaning of exam room while observing appropriate contact time as indicated for disinfecting solutions.   Patient here for a scheduled follow up.  She is accompanied by her daughter.  History obtained from both of them.  Reports she noticed rectal bleeding recently.  Had one episode - BRB - noticed blood on her pad/Depends.  Has not noticed any blood since then.  This episode occurred one week ago.  Having some irritation with having a bowel movement.  No chest pain.  Some sob with exertion - notices at times.  Has oxygen. Does not use often.  Discussed the need to use the oxygen regularly.  Eating.  Good appetite.  No nausea or vomiting.  Denies any abdominal pain.  Discussed staying hydrated.    Past Medical History:  Diagnosis Date   Allergy    Anemia    Arrhythmia    CHF (congestive heart failure) (HCC)    CVA (cerebral vascular accident) ([place])    Depression    GERD (gastroesophageal reflux disease)    History of blood transfusion    History of kidney stones    Hx: UTI (urinary tract infection)    Hypercholesterolemia    Hypertension    Past Surgical History:  Procedure Laterality Date   CHOLECYSTECTOMY  1984   NECK LESION BIOPSY  2015   Followed by Dr. Richardson Landry   Family History  Problem Relation Age of Onset   Cancer Mother        unknown type   Hypertension Mother    Cancer Father        unknown type   Cancer Daughter    Colon cancer Other        grandfather   Social History   Socioeconomic History   Marital status: Widowed    Spouse name: Not on file   Number of children: 5   Years of  education: Not on file   Highest education level: Not on file  Occupational History   Not on file  Tobacco Use   Smoking status: Former    Pack years: 0.00   Smokeless tobacco: Former    Types: Chew  Substance and Sexual Activity   Alcohol use: No    Alcohol/week: 0.0 standard drinks   Drug use: No   Sexual activity: Not Currently  Other Topics Concern   Not on file  Social History Narrative   Not on file   Social Determinants of Health   Financial Resource Strain: Not on file  Food Insecurity: Not on file  Transportation Needs: Not on file  Physical Activity: Not on file  Stress: Not on file  Social Connections: Not on file    Review of Systems  Constitutional:  Negative for appetite change and unexpected weight change.  HENT:  Negative for congestion and sinus pressure.   Respiratory:  Negative for cough and chest tightness.        Some sob with exertion as outlined.    Cardiovascular:  Negative for chest pain, palpitations and leg swelling.  Gastrointestinal:  Negative for diarrhea, nausea and vomiting.  Rectal bleeding as outlined.  Some pain with bowel movement.    Genitourinary:  Negative for difficulty urinating and dysuria.  Musculoskeletal:  Negative for joint swelling and myalgias.  Skin:  Negative for color change and rash.  Neurological:  Negative for dizziness, light-headedness and headaches.  Psychiatric/Behavioral:  Negative for agitation and dysphoric mood.       Objective:    Physical Exam Vitals reviewed.  Constitutional:      General: She is not in acute distress.    Appearance: Normal appearance.  HENT:     Head: Normocephalic and atraumatic.     Right Ear: External ear normal.     Left Ear: External ear normal.  Eyes:     General: No scleral icterus.       Right eye: No discharge.        Left eye: No discharge.     Conjunctiva/sclera: Conjunctivae normal.  Neck:     Thyroid: No thyromegaly.  Cardiovascular:     Rate and Rhythm:  Normal rate and regular rhythm.  Pulmonary:     Effort: No respiratory distress.     Breath sounds: Normal breath sounds. No wheezing.  Abdominal:     General: Bowel sounds are normal.     Palpations: Abdomen is soft.     Tenderness: There is no abdominal tenderness.  Genitourinary:    Comments: Rectal:  heme positive.   Musculoskeletal:        General: No swelling or tenderness.     Cervical back: Neck supple. No tenderness.  Lymphadenopathy:     Cervical: No cervical adenopathy.  Skin:    Findings: No erythema or rash.  Neurological:     Mental Status: She is alert.  Psychiatric:        Mood and Affect: Mood normal.        Behavior: Behavior normal.    BP 110/80   Pulse (!) 40   Temp (!) 95.3 F (35.2 C)   Ht 5\' 5"  (1.651 m)   Wt 103 lb 3.2 oz (46.8 kg)   SpO2 93%   BMI 17.17 kg/m  Wt Readings from Last 3 Encounters:  10/31/20 103 lb 3.2 oz (46.8 kg)  10/03/20 104 lb 8 oz (47.4 kg)  08/11/20 106 lb (48.1 kg)    Outpatient Encounter Medications as of 10/31/2020  Medication Sig   aspirin EC 81 MG EC tablet Take 1 tablet (81 mg total) by mouth daily.   erythromycin ophthalmic ointment Use one half inch four times daily to affected eye (s) x 7 days.   hydrocortisone (ANUSOL-HC) 25 MG suppository Place 1 suppository (25 mg total) rectally 2 (two) times daily.   sacubitril-valsartan (ENTRESTO) 49-51 MG Take 0.5 tablets by mouth 2 (two) times daily.   furosemide (LASIX) 40 MG tablet Take 0.5 tablets (20 mg total) by mouth daily.   No facility-administered encounter medications on file as of 10/31/2020.     Lab Results  Component Value Date   WBC 3.6 (L) 10/31/2020   HGB 13.1 10/31/2020   HCT 40.3 10/31/2020   PLT 263.0 10/31/2020   GLUCOSE 74 10/31/2020   CHOL 247 (H) 03/20/2020   TRIG 81 03/20/2020   HDL 66 03/20/2020   LDLDIRECT 163.5 01/12/2013   LDLCALC 165 (H) 03/20/2020   ALT 12 10/07/2020   AST 20 10/07/2020   NA 138 10/31/2020   K 3.8 10/31/2020   CL  96 10/31/2020   CREATININE 0.81 10/31/2020   BUN 11 10/31/2020  CO2 33 (H) 10/31/2020   TSH 3.811 10/07/2020   INR 1.0 03/19/2020   HGBA1C 5.7 (H) 03/20/2020       Assessment & Plan:   Problem List Items Addressed This Visit     Acute respiratory failure with hypoxia ([place])    Discussed the importance of using her oxygen regularly.  She has the oxygen at home.       Anemia    Given recent rectal bleeding.  Check CBC.       Aortic atherosclerosis (HCC)    Off statin medication.  Blood pressure controlled.  Continue entrestro and lasix.        Atrial fibrillation (HCC)    Rate appears to be controlled today.  She is on aspirin.  With active rectal bleeding I discussed with her and her daughter regarding stopping this medication.  They understand the risk of possible stroke.  Given the recent bleeding and then persistent heme positive we will stop the aspirin.  Follow.       Chronic diastolic CHF (congestive heart failure) (HCC) - Primary    Continue Entresto and Lasix as she is doing.  No evidence of increased volume overload today on exam.  Check metabolic panel.       Relevant Orders   Basic metabolic panel (Completed)   CKD (chronic kidney disease), stage III (HCC)    Avoid antiinflammatories.  Stay hydrated.  Follow metabolic panel.        COPD (chronic obstructive pulmonary disease) ([place])    Discussed need for oxygen.  Has at home.  Not using regularly.  Will start.  No increased cough or congestion.  Follow.         Essential hypertension, benign    Continues on entresto (1/2 tablet) and lasix 1/2 tablet.  Blood pressure stable.  Check metabolic panel today.        History of CVA (cerebrovascular accident)    Has a history of CVA.  Given the recent rectal bleeding and persistent heme positive stool we will stop the aspirin.  They understand the risk of possible recurrent stroke.       Hypercholesterolemia    Follow lipid panel.       Hypertensive  kidney disease with CKD (chronic kidney disease) stage V (HCC)   Hypokalemia    He has a history of hypokalemia.  She is off potassium currently.  Check potassium level today.  On Lasix.       Relevant Orders   Basic metabolic panel (Completed)   Incontinence    Given persistent incontinence, it is recommended for her to wear pads.        Mild depression ([place])    Off Lexapro.  Stable.       Rectal bleeding    Had the bleeding as outlined.  Still persistent heme positive on exam.  We will try Anusol HC suppositories.  She is having some persistent irritation with having a bowel movement.  Previous work-up as outlined.  She was felt not to be a good candidate for colonoscopy.  Check CBC today.  Hold aspirin as outlined.       Relevant Orders   CBC with Differential/Platelet (Completed)   Ferritin (Completed)   Schwannoma    Saw neurosurgery and ENT.  Stable.       SOB (shortness of breath)    Describes shortness of breath with exertion.  Notices at times.  Again instructed to use her oxygen.  Overall she feels her  breathing is stable.  No increased cough or congestion.         [name], MD

## 2020-10-31 NOTE — Telephone Encounter (Signed)
Potassium is wnl.  If she is taking lasix 1/2 tablet per day, then I would like for her to take kcl 27meq q day.  We will follow.

## 2020-10-31 NOTE — Telephone Encounter (Signed)
Patients daughter was notified and also patient is taking 1/21 tablet of lasix daily. The potassium rx has been sent to patients pharmacy.

## 2020-10-31 NOTE — Telephone Encounter (Signed)
Patients daughter stated her potassium has came back to normal and to stop taking potassium. Her last pill was about a week ago. Patient informed that Mable Paris, FNP D/C medication. Patient would like some clarification on if she was told to keep taking or keep it D/C?  [phone]

## 2020-11-02 ENCOUNTER — Encounter: Payer: Self-pay | Admitting: Internal Medicine

## 2020-11-02 DIAGNOSIS — N183 Chronic kidney disease, stage 3 unspecified: Secondary | ICD-10-CM | POA: Insufficient documentation

## 2020-11-02 DIAGNOSIS — I120 Hypertensive chronic kidney disease with stage 5 chronic kidney disease or end stage renal disease: Secondary | ICD-10-CM | POA: Insufficient documentation

## 2020-11-02 DIAGNOSIS — I700 Atherosclerosis of aorta: Secondary | ICD-10-CM | POA: Insufficient documentation

## 2020-11-02 DIAGNOSIS — N185 Chronic kidney disease, stage 5: Secondary | ICD-10-CM | POA: Insufficient documentation

## 2020-11-02 DIAGNOSIS — R32 Unspecified urinary incontinence: Secondary | ICD-10-CM | POA: Insufficient documentation

## 2020-11-02 NOTE — Assessment & Plan Note (Signed)
Off Lexapro.  Stable.

## 2020-11-02 NOTE — Assessment & Plan Note (Signed)
Had the bleeding as outlined.  Still persistent heme positive on exam.  We will try Anusol HC suppositories.  She is having some persistent irritation with having a bowel movement.  Previous work-up as outlined.  She was felt not to be a good candidate for colonoscopy.  Check CBC today.  Hold aspirin as outlined.

## 2020-11-02 NOTE — Assessment & Plan Note (Signed)
Saw neurosurgery and ENT.  Stable.

## 2020-11-02 NOTE — Assessment & Plan Note (Signed)
Continues on entresto (1/2 tablet) and lasix 1/2 tablet.  Blood pressure stable.  Check metabolic panel today.

## 2020-11-02 NOTE — Assessment & Plan Note (Signed)
Given persistent incontinence, it is recommended for her to wear pads.

## 2020-11-02 NOTE — Assessment & Plan Note (Signed)
Has a history of CVA.  Given the recent rectal bleeding and persistent heme positive stool we will stop the aspirin.  They understand the risk of possible recurrent stroke.

## 2020-11-02 NOTE — Assessment & Plan Note (Signed)
Follow lipid panel.   

## 2020-11-02 NOTE — Assessment & Plan Note (Signed)
He has a history of hypokalemia.  She is off potassium currently.  Check potassium level today.  On Lasix.

## 2020-11-02 NOTE — Assessment & Plan Note (Signed)
Off statin medication.  Blood pressure controlled.  Continue entrestro and lasix.

## 2020-11-02 NOTE — Assessment & Plan Note (Signed)
Describes shortness of breath with exertion.  Notices at times.  Again instructed to use her oxygen.  Overall she feels her breathing is stable.  No increased cough or congestion.

## 2020-11-02 NOTE — Assessment & Plan Note (Signed)
Continue Entresto and Lasix as she is doing.  No evidence of increased volume overload today on exam.  Check metabolic panel.

## 2020-11-02 NOTE — Assessment & Plan Note (Signed)
Avoid antiinflammatories.  Stay hydrated.  Follow metabolic panel.   

## 2020-11-02 NOTE — Assessment & Plan Note (Signed)
Discussed the importance of using her oxygen regularly.  She has the oxygen at home.

## 2020-11-02 NOTE — Assessment & Plan Note (Signed)
Rate appears to be controlled today.  She is on aspirin.  With active rectal bleeding I discussed with her and her daughter regarding stopping this medication.  They understand the risk of possible stroke.  Given the recent bleeding and then persistent heme positive we will stop the aspirin.  Follow.

## 2020-11-02 NOTE — Assessment & Plan Note (Signed)
Given recent rectal bleeding.  Check CBC.

## 2020-11-03 DIAGNOSIS — J449 Chronic obstructive pulmonary disease, unspecified: Secondary | ICD-10-CM | POA: Insufficient documentation

## 2020-11-03 DIAGNOSIS — I10 Essential (primary) hypertension: Secondary | ICD-10-CM | POA: Diagnosis not present

## 2020-11-03 NOTE — Assessment & Plan Note (Signed)
Discussed need for oxygen.  Has at home.  Not using regularly.  Will start.  No increased cough or congestion.  Follow.

## 2020-11-12 ENCOUNTER — Telehealth: Payer: Self-pay

## 2020-11-12 NOTE — Telephone Encounter (Signed)
Received completed paperwork for [facility] from [employer]. Paperwork was faxed to [phone]. Paperwork was placed in scan.

## 2020-11-13 ENCOUNTER — Telehealth: Payer: Self-pay | Admitting: Internal Medicine

## 2020-11-13 NOTE — Telephone Encounter (Signed)
PT was suppose to have a apt tomorrow but they never got the hydrocortisone (ANUSOL-HC) 25 MG suppository due to their insurance would not cover it. They would like to see if something else could be called in that insurance will cover that way they can then be seen.

## 2020-11-13 NOTE — Telephone Encounter (Signed)
I called and spoke with Taylor Watson at [facility] she stated that even with discount card that the suppositories are $53.93 & too expensive for patient. She stated that the prescription cream is covered under patient's insurance. Are you willing to send since patient cancelled appointment for tomorrow.

## 2020-11-14 ENCOUNTER — Other Ambulatory Visit: Payer: Self-pay

## 2020-11-14 ENCOUNTER — Ambulatory Visit: Payer: Medicare Other | Admitting: Internal Medicine

## 2020-11-14 DIAGNOSIS — R0602 Shortness of breath: Secondary | ICD-10-CM | POA: Diagnosis not present

## 2020-11-14 MED ORDER — HYDROCORTISONE 2.5 % EX CREA: TOPICAL_CREAM | Freq: Two times a day (BID) | CUTANEOUS | 0 refills | Status: DC | PRN

## 2020-11-14 NOTE — Telephone Encounter (Signed)
I called [facility] & they stated that insurance completely covers the 2.5% hydrocortisone anal cream. I called patient's daughter, Taylor Watson & she stated that patient is no longer seeing any blood, but would like cream sent. I told her I would let her know if cream was sent.

## 2020-11-14 NOTE — Telephone Encounter (Signed)
I am ok to send in the cream.  Which cream is covered? Are they referring to analpam?  I can send in whichever cream is covered.  Is she still noticing blood?

## 2020-11-14 NOTE — Telephone Encounter (Signed)
Rx for cream sent in to pharmacy.  If she is not having any irritation or bleeding, can hold on using.  Also, do not use in the same place for more than 7-10 days.  Do not use on face, genital area or breasts.

## 2020-11-14 NOTE — Telephone Encounter (Signed)
Pt's daughter Taylor Watson was advised that cream was sent. I advised NOT to use if no irritation or bleeding. Also was instructed not to use on genitals, face or breast. Patient's daughter verbalized understanding.

## 2020-11-14 NOTE — Addendum Note (Signed)
Addended by: Alisa Graff on: 11/14/2020 01:12 PM   Modules accepted: Orders

## 2020-12-04 DIAGNOSIS — I10 Essential (primary) hypertension: Secondary | ICD-10-CM | POA: Diagnosis not present

## 2020-12-08 NOTE — Progress Notes (Signed)
Patient ID: Taylor Watson, female    DOB: 06-10-1918, 87 y.o.   MRN: [id number]  HPI  Taylor Watson is a 87 y/o female with a history of hyperlipidemia, HTN, stroke, anemia, GERD, depression, previous tobacco use and chronic heart failure.   Echo report from 03/19/20 reviewed and showed an EF of 60-65% along with mild LVH and mildly elevated PA pressure. Echo report from 07/19/19 reviewed and showed an EF of 55-60% with moderate LVH, mild MR and severely elevated PA pressure.   Has not been admitted or been in the ED in the last 6 months.     She presents today for a follow-up visit with a chief complaint of minimal shortness of breath upon moderate exertion. She describes this as chronic in nature having been present for many years although has improved since she got the excess fluid off her legs. She has associated fatigue, intermittent neck pain, light-headedness and depression along with this. She denies any difficulty sleeping, abdominal distention, palpitations, pedal edema, chest pain, cough or weight gain.   In fact, she's lost quite a bit of weight by her home scale since the swelling in her legs went down. Reports a good appetite and adds a little pinch of salt to her eggs. She does voice feeling depressed and would be willing to be treated for this.   Past Medical History:  Diagnosis Date   Allergy    Anemia    Arrhythmia    CHF (congestive heart failure) (HCC)    CVA (cerebral vascular accident) ([place])    Depression    GERD (gastroesophageal reflux disease)    History of blood transfusion    History of kidney stones    Hx: UTI (urinary tract infection)    Hypercholesterolemia    Hypertension    Past Surgical History:  Procedure Laterality Date   CHOLECYSTECTOMY  1984   NECK LESION BIOPSY  2015   Followed by Dr. Richardson Landry   Family History  Problem Relation Age of Onset   Cancer Mother        unknown type   Hypertension Mother    Cancer Father        unknown type    Cancer Daughter    Colon cancer Other        grandfather   Social History   Tobacco Use   Smoking status: Former   Smokeless tobacco: Former    Types: Chew  Substance Use Topics   Alcohol use: No    Alcohol/week: 0.0 standard drinks   Allergies  Allergen Reactions   Dyazide [Hydrochlorothiazide W-Triamterene] Other (See Comments)    Unknown reaction   Toprol Xl [Metoprolol Tartrate] Other (See Comments)    unknown   Monopril [Fosinopril] Cough   Verapamil Other (See Comments)    headache   Prior to Admission medications   Medication Sig Start Date End Date Taking? Authorizing Provider  furosemide (LASIX) 40 MG tablet Take 0.5 tablets (20 mg total) by mouth daily. 09/13/19  Yes [name], MD  hydrocortisone (ANUSOL-HC) 25 MG suppository Place 1 suppository (25 mg total) rectally 2 (two) times daily. 10/31/20  Yes [name], MD  hydrocortisone 2.5 % cream Apply topically 2 (two) times daily as needed. Apply to irritated area on rectum bid prn.  Use no more than 7-10 days in same location. 11/14/20  Yes [name], MD  potassium chloride (KLOR-CON) 10 MEQ tablet Take 1 tablet (10 mEq total) by mouth daily. 10/31/20  Yes Scott,  Charlene, MD  sacubitril-valsartan (ENTRESTO) 49-51 MG Take 0.5 tablets by mouth 2 (two) times daily. 09/29/20  Yes Darylene Price A, FNP  aspirin EC 81 MG EC tablet Take 1 tablet (81 mg total) by mouth daily. Patient not taking: Reported on 12/09/2020 05/20/19   Lavina Hamman, MD  erythromycin ophthalmic ointment Use one half inch four times daily to affected eye (s) x 7 days. Patient not taking: Reported on 12/09/2020 10/03/20   Burnard Hawthorne, FNP    Review of Systems  Constitutional:  Positive for fatigue. Negative for appetite change.  HENT:  Negative for congestion, postnasal drip and sore throat.   Eyes: Negative.   Respiratory:  Positive for shortness of breath. Negative for cough.   Cardiovascular:  Negative for chest pain, palpitations  and leg swelling.  Gastrointestinal:  Negative for abdominal distention and abdominal pain.  Endocrine: Negative.   Genitourinary: Negative.   Musculoskeletal:  Positive for neck pain (at times). Negative for back pain.  Skin: Negative.   Allergic/Immunologic: Negative.   Neurological:  Positive for light-headedness. Negative for dizziness.  Hematological:  Negative for adenopathy. Does not bruise/bleed easily.  Psychiatric/Behavioral:  Positive for dysphoric mood. Negative for sleep disturbance (sleeping on 1 pillow). The patient is not nervous/anxious.    Vitals:   12/09/20 1009  BP: (!) 157/91  Pulse: 61  Resp: 16  SpO2: 91%  Weight: 91 lb 2 oz (41.3 kg)  Height: 5' (1.524 m)   Wt Readings from Last 3 Encounters:  12/09/20 91 lb 2 oz (41.3 kg)  10/31/20 103 lb 3.2 oz (46.8 kg)  10/03/20 104 lb 8 oz (47.4 kg)   Lab Results  Component Value Date   CREATININE 0.81 10/31/2020   CREATININE 0.91 10/10/2020   CREATININE 1.18 (H) 10/07/2020    Physical Exam Vitals and nursing note reviewed. Exam conducted with a chaperone present (daughter).  Constitutional:      Appearance: Normal appearance.  HENT:     Head: Normocephalic and atraumatic.  Cardiovascular:     Rate and Rhythm: Normal rate and regular rhythm.  Pulmonary:     Effort: Pulmonary effort is normal. No respiratory distress.     Breath sounds: No wheezing or rales.  Abdominal:     General: Abdomen is flat. There is no distension.     Palpations: Abdomen is soft.  Musculoskeletal:        General: No tenderness.     Cervical back: Normal range of motion and neck supple.     Right lower leg: No edema.     Left lower leg: No edema.  Skin:    General: Skin is warm and dry.  Neurological:     General: No focal deficit present.     Mental Status: She is alert and oriented to person, place, and time.  Psychiatric:        Mood and Affect: Mood is depressed.        Behavior: Behavior normal.        Thought  Content: Thought content normal.   Assessment & Plan:  1: Chronic heart failure with preserved ejection fraction and structural changes of mild LVH- - NYHA class II - euvolemic today - weighing daily & says that her home weight has declined due to reduction in leg swelling; reminded to call for an overnight weight gain of >2 pounds or a weekly weight gain of >5 pounds - weight down 14.8 pounds from last visit here 4 months ago - patient  says that she does add salt to her eggs and her daughter says that she doesn't cook food with salt; encouraged her to not add any salt to her food & low sodium cookbook provided - on entresto but hesitant to increase dose as she's already experiencing light-headedness with position change - wearing oxygen as needed at home - BNP 03/19/20 was 324.4  2: HTN- - BP mildly elevated today - saw PCP Nicki Reaper) 10/31/20 - BMP from 10/31/20 reviewed and showed sodium 138, potassium 3.8, creatinine 0.81 and GFR 59.81  3: Depression- - patient says that she's feeling depressed and would like treatment for this - explained that any therapy would need to come from Dr. Nicki Reaper; secure chat sent to Dr. Nicki Reaper about this   Patient did not bring her medications nor a list. Each medication was verbally reviewed with the patient and she was encouraged to bring the bottles to every visit to confirm accuracy of list.   Return in 6 months or sooner for any questions/problems before then.

## 2020-12-09 ENCOUNTER — Telehealth: Payer: Self-pay | Admitting: Internal Medicine

## 2020-12-09 ENCOUNTER — Other Ambulatory Visit: Payer: Self-pay

## 2020-12-09 ENCOUNTER — Ambulatory Visit: Payer: Medicare Other | Attending: Family | Admitting: Family

## 2020-12-09 ENCOUNTER — Encounter: Payer: Self-pay | Admitting: Family

## 2020-12-09 VITALS — BP 157/91 | HR 61 | Resp 16 | Ht 60.0 in | Wt 91.1 lb

## 2020-12-09 DIAGNOSIS — E785 Hyperlipidemia, unspecified: Secondary | ICD-10-CM | POA: Insufficient documentation

## 2020-12-09 DIAGNOSIS — Z8249 Family history of ischemic heart disease and other diseases of the circulatory system: Secondary | ICD-10-CM | POA: Insufficient documentation

## 2020-12-09 DIAGNOSIS — Z7982 Long term (current) use of aspirin: Secondary | ICD-10-CM | POA: Insufficient documentation

## 2020-12-09 DIAGNOSIS — Z87891 Personal history of nicotine dependence: Secondary | ICD-10-CM | POA: Diagnosis not present

## 2020-12-09 DIAGNOSIS — F32A Depression, unspecified: Secondary | ICD-10-CM | POA: Diagnosis not present

## 2020-12-09 DIAGNOSIS — F329 Major depressive disorder, single episode, unspecified: Secondary | ICD-10-CM

## 2020-12-09 DIAGNOSIS — I110 Hypertensive heart disease with heart failure: Secondary | ICD-10-CM | POA: Insufficient documentation

## 2020-12-09 DIAGNOSIS — E7800 Pure hypercholesterolemia, unspecified: Secondary | ICD-10-CM | POA: Diagnosis not present

## 2020-12-09 DIAGNOSIS — Z79899 Other long term (current) drug therapy: Secondary | ICD-10-CM | POA: Diagnosis not present

## 2020-12-09 DIAGNOSIS — I10 Essential (primary) hypertension: Secondary | ICD-10-CM

## 2020-12-09 DIAGNOSIS — I5032 Chronic diastolic (congestive) heart failure: Secondary | ICD-10-CM

## 2020-12-09 NOTE — Telephone Encounter (Signed)
I received a note from [facility].  She informed me Taylor Watson is having some issues with depression.  Please call and confirm she is doing ok.  Does she want to schedule an appt - office or virtual - whichever they prefer.  Let me know if any acute problems.

## 2020-12-09 NOTE — Patient Instructions (Signed)
Continue weighing daily and call for an overnight weight gain of > 2 pounds or a weekly weight gain of >5 pounds. 

## 2020-12-10 NOTE — Telephone Encounter (Signed)
LMTCB

## 2020-12-15 DIAGNOSIS — R0602 Shortness of breath: Secondary | ICD-10-CM | POA: Diagnosis not present

## 2020-12-16 NOTE — Telephone Encounter (Signed)
Spoke with patients daughter. She has been having some issues with depression but overall doing ok. She is still eating, drinking, etc. Daughter prefers to bring her in for face to face with Dr Nicki Reaper because she thinks it would be easier for the patient to answer questions and talk to Dr Nicki Reaper in person instead of over the phone. I have scheduled them for 8/19 at 12.

## 2020-12-19 ENCOUNTER — Ambulatory Visit: Payer: Medicare Other | Admitting: Internal Medicine

## 2020-12-30 IMAGING — DX DG CHEST 1V
1 series · 1 of 1 positions shown · non-contrast
Comparison: 07/19/2019, 05/17/2019 and 2726

CLINICAL DATA: Shortness of breath and lightheadedness since
yesterday.

EXAM:
CHEST  1 VIEW

[chest ap]
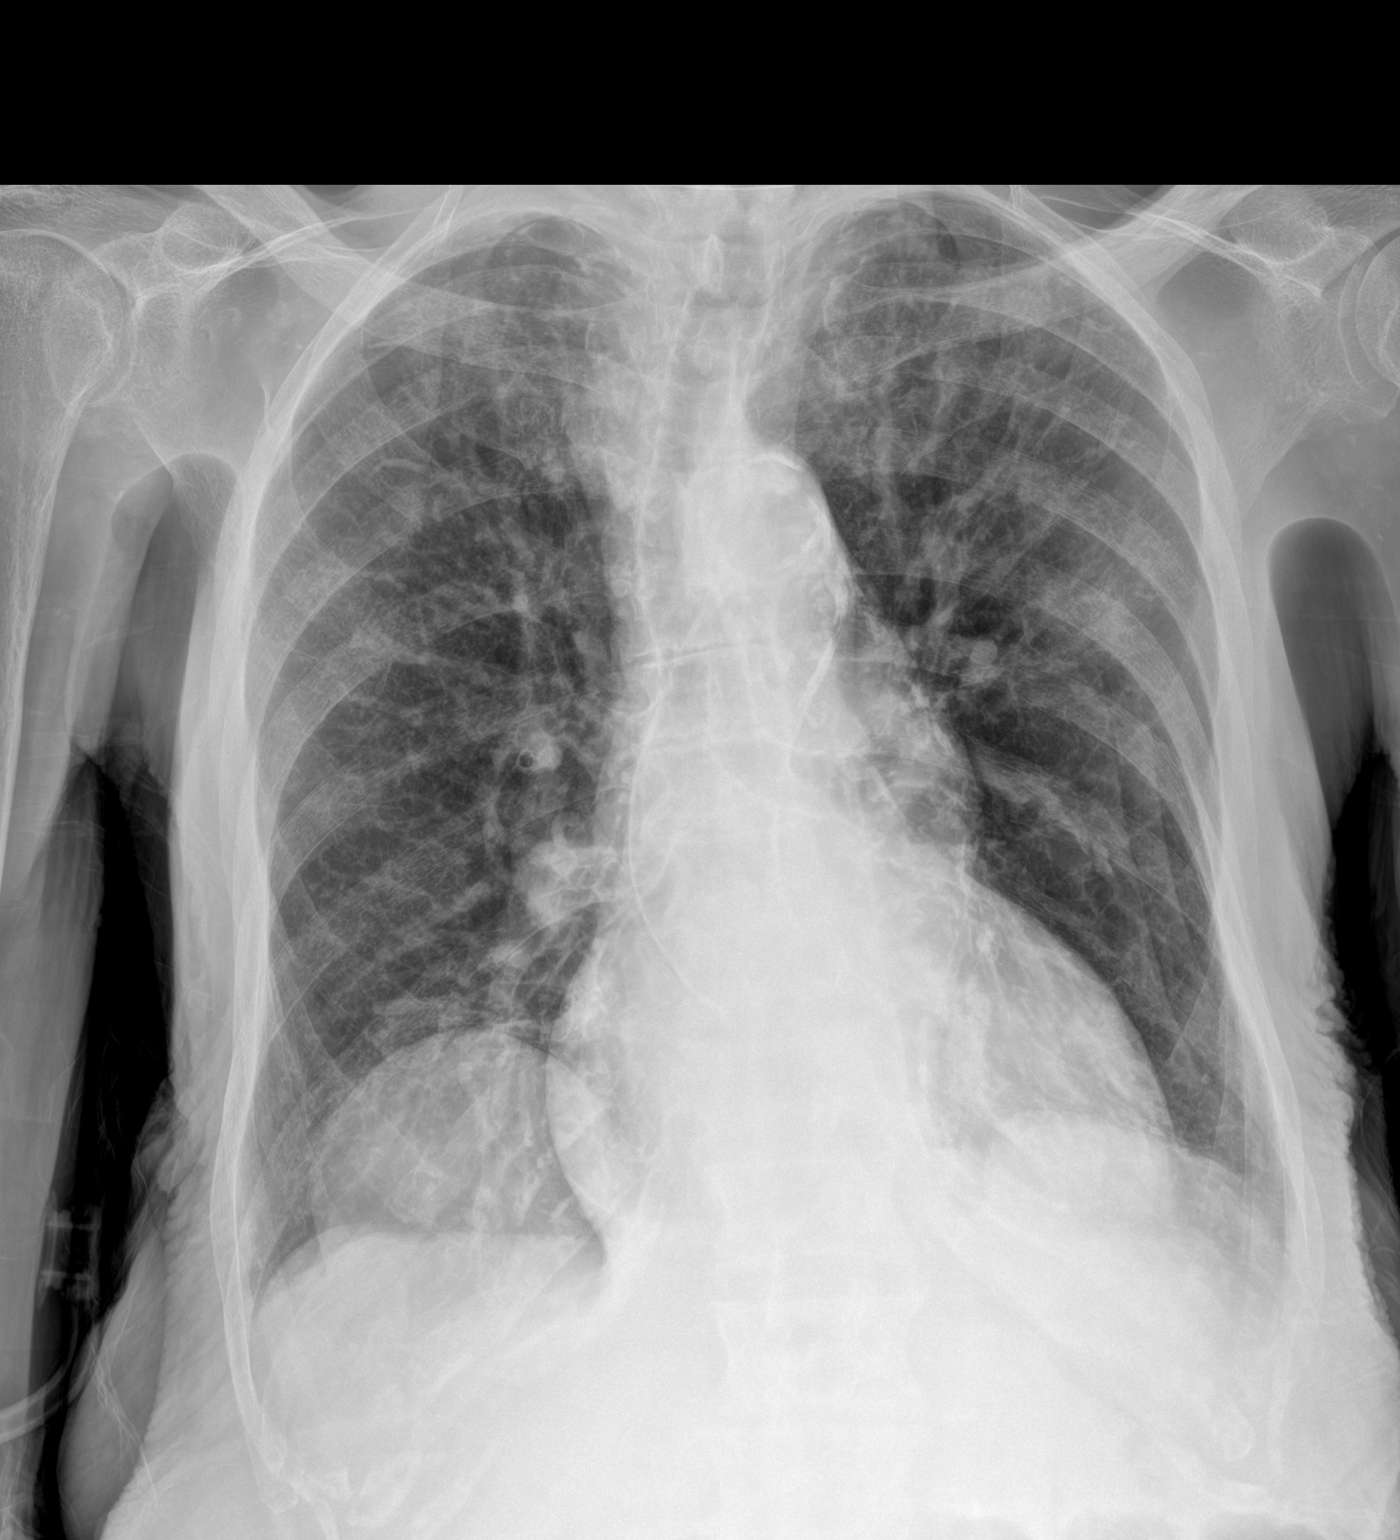

[1 of 1 positions shown; findings below may reference images not displayed]

FINDINGS: Lungs are somewhat hyperexpanded with interval improvement in
previously noted bibasilar opacification likely improved/resolved
bilateral effusions and bibasilar atelectasis. Stable coarse
bronchovascular markings over the mid to upper lungs. Mild stable
cardiomegaly. Remainder of the exam is unchanged.
IMPRESSION: 1. Interval improvement to resolution of previously seen bilateral
effusions with bibasilar atelectasis.

2. Persistent mild coarse prominence of the bronchovascular markings
over the mid to upper lungs. Findings may be due to mild vascular
congestion versus acute to subacute bronchitic process.

## 2021-01-04 DIAGNOSIS — I10 Essential (primary) hypertension: Secondary | ICD-10-CM | POA: Diagnosis not present

## 2021-01-06 ENCOUNTER — Inpatient Hospital Stay
Admission: EM | Admit: 2021-01-06 | Discharge: 2021-01-08 | DRG: 178 | Disposition: A | Discharge status: Home Health | Payer: Medicare Other | Attending: Internal Medicine | Admitting: Internal Medicine

## 2021-01-06 ENCOUNTER — Emergency Department: Payer: Medicare Other

## 2021-01-06 ENCOUNTER — Other Ambulatory Visit: Payer: Self-pay | Admitting: Family

## 2021-01-06 ENCOUNTER — Other Ambulatory Visit: Payer: Self-pay

## 2021-01-06 DIAGNOSIS — I5032 Chronic diastolic (congestive) heart failure: Secondary | ICD-10-CM | POA: Diagnosis present

## 2021-01-06 DIAGNOSIS — F32A Depression, unspecified: Secondary | ICD-10-CM | POA: Diagnosis not present

## 2021-01-06 DIAGNOSIS — R531 Weakness: Secondary | ICD-10-CM | POA: Diagnosis present

## 2021-01-06 DIAGNOSIS — A0839 Other viral enteritis: Secondary | ICD-10-CM | POA: Diagnosis not present

## 2021-01-06 DIAGNOSIS — Z7982 Long term (current) use of aspirin: Secondary | ICD-10-CM

## 2021-01-06 DIAGNOSIS — Z87891 Personal history of nicotine dependence: Secondary | ICD-10-CM

## 2021-01-06 DIAGNOSIS — I499 Cardiac arrhythmia, unspecified: Secondary | ICD-10-CM | POA: Diagnosis not present

## 2021-01-06 DIAGNOSIS — Z8249 Family history of ischemic heart disease and other diseases of the circulatory system: Secondary | ICD-10-CM | POA: Diagnosis not present

## 2021-01-06 DIAGNOSIS — E161 Other hypoglycemia: Secondary | ICD-10-CM | POA: Diagnosis not present

## 2021-01-06 DIAGNOSIS — I10 Essential (primary) hypertension: Secondary | ICD-10-CM | POA: Diagnosis not present

## 2021-01-06 DIAGNOSIS — R197 Diarrhea, unspecified: Secondary | ICD-10-CM | POA: Diagnosis present

## 2021-01-06 DIAGNOSIS — Z800 Family history of malignant neoplasm of digestive organs: Secondary | ICD-10-CM

## 2021-01-06 DIAGNOSIS — U071 COVID-19: Principal | ICD-10-CM | POA: Diagnosis present

## 2021-01-06 DIAGNOSIS — D696 Thrombocytopenia, unspecified: Secondary | ICD-10-CM | POA: Diagnosis not present

## 2021-01-06 DIAGNOSIS — R0602 Shortness of breath: Secondary | ICD-10-CM | POA: Diagnosis not present

## 2021-01-06 DIAGNOSIS — E43 Unspecified severe protein-calorie malnutrition: Secondary | ICD-10-CM | POA: Insufficient documentation

## 2021-01-06 DIAGNOSIS — I110 Hypertensive heart disease with heart failure: Secondary | ICD-10-CM | POA: Diagnosis not present

## 2021-01-06 DIAGNOSIS — G9349 Other encephalopathy: Secondary | ICD-10-CM | POA: Diagnosis present

## 2021-01-06 DIAGNOSIS — Z79899 Other long term (current) drug therapy: Secondary | ICD-10-CM

## 2021-01-06 DIAGNOSIS — Z8673 Personal history of transient ischemic attack (TIA), and cerebral infarction without residual deficits: Secondary | ICD-10-CM | POA: Diagnosis not present

## 2021-01-06 DIAGNOSIS — E7800 Pure hypercholesterolemia, unspecified: Secondary | ICD-10-CM | POA: Diagnosis not present

## 2021-01-06 DIAGNOSIS — Z28310 Unvaccinated for covid-19: Secondary | ICD-10-CM

## 2021-01-06 DIAGNOSIS — E162 Hypoglycemia, unspecified: Secondary | ICD-10-CM | POA: Diagnosis not present

## 2021-01-06 DIAGNOSIS — Z888 Allergy status to other drugs, medicaments and biological substances status: Secondary | ICD-10-CM

## 2021-01-06 DIAGNOSIS — Z9049 Acquired absence of other specified parts of digestive tract: Secondary | ICD-10-CM

## 2021-01-06 DIAGNOSIS — R0902 Hypoxemia: Secondary | ICD-10-CM | POA: Diagnosis not present

## 2021-01-06 DIAGNOSIS — Z743 Need for continuous supervision: Secondary | ICD-10-CM | POA: Diagnosis not present

## 2021-01-06 LAB — CBC WITH DIFFERENTIAL/PLATELET
Abs Immature Granulocytes: 0.02 10*3/uL (ref 0.00–0.07)
Basophils Absolute: 0 10*3/uL (ref 0.0–0.1)
Basophils Relative: 0 %
Eosinophils Absolute: 0 10*3/uL (ref 0.0–0.5)
Eosinophils Relative: 0 %
HCT: 46.2 % — ABNORMAL HIGH (ref 36.0–46.0)
Hemoglobin: 14.6 g/dL (ref 12.0–15.0)
Immature Granulocytes: 1 %
Lymphocytes Relative: 27 %
Lymphs Abs: 0.8 10*3/uL (ref 0.7–4.0)
MCH: 28.7 pg (ref 26.0–34.0)
MCHC: 31.6 g/dL (ref 30.0–36.0)
MCV: 90.9 fL (ref 80.0–100.0)
Monocytes Absolute: 0.5 10*3/uL (ref 0.1–1.0)
Monocytes Relative: 14 %
Neutro Abs: 1.8 10*3/uL (ref 1.7–7.7)
Neutrophils Relative %: 58 %
Platelets: 163 10*3/uL (ref 150–400)
RBC: 5.08 MIL/uL (ref 3.87–5.11)
RDW: 15.1 % (ref 11.5–15.5)
WBC: 3.2 10*3/uL — ABNORMAL LOW (ref 4.0–10.5)
nRBC: 0 % (ref 0.0–0.2)

## 2021-01-06 LAB — COMPREHENSIVE METABOLIC PANEL
ALT: 11 U/L (ref 0–44)
AST: 22 U/L (ref 15–41)
Albumin: 3.3 g/dL — ABNORMAL LOW (ref 3.5–5.0)
Alkaline Phosphatase: 81 U/L (ref 38–126)
Anion gap: 7 (ref 5–15)
BUN: 28 mg/dL — ABNORMAL HIGH (ref 8–23)
CO2: 28 mmol/L (ref 22–32)
Calcium: 8.4 mg/dL — ABNORMAL LOW (ref 8.9–10.3)
Chloride: 101 mmol/L (ref 98–111)
Creatinine, Ser: 1.05 mg/dL — ABNORMAL HIGH (ref 0.44–1.00)
GFR, Estimated: 47 mL/min — ABNORMAL LOW (ref >60)
Glucose, Bld: 144 mg/dL — ABNORMAL HIGH (ref 70–99)
Potassium: 4.4 mmol/L (ref 3.5–5.1)
Sodium: 136 mmol/L (ref 135–145)
Total Bilirubin: 0.7 mg/dL (ref 0.3–1.2)
Total Protein: 6.6 g/dL (ref 6.5–8.1)

## 2021-01-06 LAB — URINALYSIS, COMPLETE (UACMP) WITH MICROSCOPIC
Bilirubin Urine: NEGATIVE
Glucose, UA: NEGATIVE mg/dL
Ketones, ur: NEGATIVE mg/dL
Nitrite: NEGATIVE
Protein, ur: 100 mg/dL — AB
Specific Gravity, Urine: 1.02 (ref 1.005–1.030)
WBC, UA: >50 WBC/hpf (ref 0–5)
pH: 6.5 (ref 5.0–8.0)

## 2021-01-06 LAB — RESP PANEL BY RT-PCR (FLU A&B, COVID) ARPGX2
Influenza A by PCR: NEGATIVE
Influenza B by PCR: NEGATIVE
SARS Coronavirus 2 by RT PCR: POSITIVE — AB

## 2021-01-06 LAB — CBG MONITORING, ED
Glucose-Capillary: 57 mg/dL — ABNORMAL LOW (ref 70–99)
Glucose-Capillary: 134 mg/dL — ABNORMAL HIGH (ref 70–99)
Glucose-Capillary: 50 mg/dL — ABNORMAL LOW (ref 70–99)
Glucose-Capillary: 81 mg/dL (ref 70–99)

## 2021-01-06 LAB — TROPONIN I (HIGH SENSITIVITY): Troponin I (High Sensitivity): 43 ng/L — ABNORMAL HIGH (ref <18)

## 2021-01-06 MED ORDER — SACUBITRIL-VALSARTAN 49-51 MG PO TABS
0.5000 | ORAL_TABLET | Freq: Two times a day (BID) | ORAL | Status: DC
  Administered 2021-01-06 – 2021-01-08 (×4): 0.5 via ORAL
  Filled 2021-01-06 (×5): qty 0.5

## 2021-01-06 MED ORDER — ACETAMINOPHEN 325 MG PO TABS: 650.0000 mg | ORAL_TABLET | Freq: Four times a day (QID) | ORAL | Status: DC | PRN

## 2021-01-06 MED ORDER — SACUBITRIL-VALSARTAN 49-51 MG PO TABS: 0.5000 | ORAL_TABLET | Freq: Two times a day (BID) | ORAL | Status: DC

## 2021-01-06 MED ORDER — POTASSIUM CHLORIDE CRYS ER 10 MEQ PO TBCR
10.0000 meq | EXTENDED_RELEASE_TABLET | Freq: Every day | ORAL | Status: DC
  Administered 2021-01-07 – 2021-01-08 (×2): 10 meq via ORAL
  Filled 2021-01-06 (×2): qty 1

## 2021-01-06 MED ORDER — SODIUM CHLORIDE 0.9 % IV SOLN
1.0000 g | Freq: Once | INTRAVENOUS | Status: AC
  Administered 2021-01-06: 1 g via INTRAVENOUS
  Filled 2021-01-06: qty 10

## 2021-01-06 MED ORDER — DEXTROSE 10 % IV SOLN: INTRAVENOUS | Status: DC

## 2021-01-06 MED ORDER — SODIUM CHLORIDE 0.9 % IV BOLUS
1000.0000 mL | Freq: Once | INTRAVENOUS | Status: AC
  Administered 2021-01-06: 1000 mL via INTRAVENOUS

## 2021-01-06 MED ORDER — ACETAMINOPHEN 650 MG RE SUPP: 650.0000 mg | Freq: Four times a day (QID) | RECTAL | Status: DC | PRN

## 2021-01-06 MED ORDER — DEXTROSE-NACL 5-0.9 % IV SOLN: INTRAVENOUS | Status: DC

## 2021-01-06 NOTE — H&P (Addendum)
History and Physical    Taylor Watson IPJ:[id number] DOB: 1919-02-03 DOA: 01/06/2021  PCP: [name], MD  Patient coming from: home   Chief Complaint: diarrhea  HPI: Taylor Watson is a 87 y.o. female with medical history significant of heart failure diastolic, CVA, hyperlipidemia, depression, hypertension presents with cough and diarrhea over the last couple days.  Patient had some loose watery diarrhea no blood.  Patient lives with her grandson initially ED provider Dr. Delena Bali spoke with him who reported liquid diarrhea.  Patient had some confusion with this back to baseline.  Patient had some leuks on UA although no urinary complaints was given Rocephin in the ED.  Patient had not eaten today blood sugar was 57 got some D10 as well as a in the ED and blood sugar is now over 100.  Patient is positive for COVID in the ED but mainly maintaining her sats at 95%..  Patient received liter fluid in the ED.  Repeat blood sugar is 134.  Potassium sodium within normal limits creatinine mildly elevated at 1.05.  White count 3.2 H&H 14 and 46 platelets 163.  EKG sinus rhythm.  ED provider discussed with the patient's grandson who reported per ED provider his mother which patient's daughter helps take care of her is also sick and hospital service called for observation. Pt is on entresto-49/51-1/2 tab, kcl 73meq, lasix 40mg -1/2 tab per grandson-Mr. Michael over the phone.  Patient is alert and oriented x2 does complain of diarrhea but not able to provide much details.  I discussed the patient with Mr. [name] her grandson over phone.  Patient is a full code at this time.  Patient's daughter which is Mr. Norva Riffle mother has COVID which is known contact to for patient.  Patient is not vaccinated against COVID.   ED Course: as above  Review of Systems: As per HPI otherwise all other systems reviewed and are negative.   Past Medical History:  Diagnosis Date   Allergy    Anemia     Arrhythmia    CHF (congestive heart failure) (HCC)    CVA (cerebral vascular accident) ([place])    Depression    GERD (gastroesophageal reflux disease)    History of blood transfusion    History of kidney stones    Hx: UTI (urinary tract infection)    Hypercholesterolemia    Hypertension     Past Surgical History:  Procedure Laterality Date   CHOLECYSTECTOMY  1984   NECK LESION BIOPSY  2015   Followed by Dr. Richardson Landry    Social History  reports that she has quit smoking. She has quit using smokeless tobacco.  Her smokeless tobacco use included chew. She reports that she does not drink alcohol and does not use drugs.  Allergies  Allergen Reactions   Dyazide [Hydrochlorothiazide W-Triamterene] Other (See Comments)    Unknown reaction   Toprol Xl [Metoprolol Tartrate] Other (See Comments)    unknown   Monopril [Fosinopril] Cough   Verapamil Other (See Comments)    headache    Family History  Problem Relation Age of Onset   Cancer Mother        unknown type   Hypertension Mother    Cancer Father        unknown type   Cancer Daughter    Colon cancer Other        grandfather     Prior to Admission medications   Medication Sig Start Date End Date Taking? Authorizing Provider  aspirin EC 81 MG EC tablet Take 1 tablet (81 mg total) by mouth daily. Patient not taking: Reported on 12/09/2020 05/20/19   Lavina Hamman, MD  erythromycin ophthalmic ointment Use one half inch four times daily to affected eye (s) x 7 days. Patient not taking: Reported on 12/09/2020 10/03/20   Burnard Hawthorne, FNP  furosemide (LASIX) 40 MG tablet Take 0.5 tablets (20 mg total) by mouth daily. 09/13/19   [name], MD  hydrocortisone (ANUSOL-HC) 25 MG suppository Place 1 suppository (25 mg total) rectally 2 (two) times daily. 10/31/20   [name], MD  hydrocortisone 2.5 % cream Apply topically 2 (two) times daily as needed. Apply to irritated area on rectum bid prn.  Use no more than 7-10 days in  same location. 11/14/20   [name], MD  potassium chloride (KLOR-CON) 10 MEQ tablet TAKE 1 TABLET BY MOUTH TWICE A DAY FOR 4 DAYS THEN STOP 01/06/21   [name], MD  sacubitril-valsartan (ENTRESTO) 49-51 MG Take 0.5 tablets by mouth 2 (two) times daily. 09/29/20   Alisa Graff, FNP    Physical Exam: Vitals:   01/06/21 1248 01/06/21 1300 01/06/21 1330 01/06/21 1430  BP:  (!) 149/66 (!) 150/66 (!) 143/45  Pulse:      Resp:  17 16 (!) 21  Temp:      TempSrc:      SpO2:   95%   Weight: 40.8 kg     Height: 5\' 6"  (1.676 m)       Constitutional: NAD, calm, comfortable, thin female Vitals:   01/06/21 1248 01/06/21 1300 01/06/21 1330 01/06/21 1430  BP:  (!) 149/66 (!) 150/66 (!) 143/45  Pulse:      Resp:  17 16 (!) 21  Temp:      TempSrc:      SpO2:   95%   Weight: 40.8 kg     Height: 5\' 6"  (1.676 m)      Eyes: PERRL, lids and conjunctivae normal ENMT: dry membranes are moist. Posterior pharynx clear of any exudate or lesions.Normal dentition.  Neck: normal, supple, no masses, no thyromegaly Respiratory: clear to auscultation bilaterally, no wheezing, no crackles. Normal respiratory effort. No accessory muscle use.  Cardiovascular: Regular rate and rhythm, no murmurs / rubs / gallops. No extremity edema. 2+ pedal pulses.   Abdomen: no tenderness, no masses palpated. Bowel sounds positive.  Musculoskeletal: no clubbing / cyanosis. No joint deformity upper and lower extremities. Thin females, moves all 4 extremities spontaneously Skin: no rashes, lesions, ulcers. No induration Neurologic: CN 2-12 grossly intact. Sensation intact,  Strength 3/5 in all 4.  Psychiatric: Alert and oriented x 2. Normal mood.     Labs on Admission: I have personally reviewed following labs and imaging studies  CBC: Recent Labs  Lab 01/06/21 1252  WBC 3.2*  NEUTROABS 1.8  HGB 14.6  HCT 46.2*  MCV 90.9  PLT 993    Basic Metabolic Panel: Recent Labs  Lab 01/06/21 1252  NA 136  K  4.4  CL 101  CO2 28  GLUCOSE 144*  BUN 28*  CREATININE 1.05*  CALCIUM 8.4*    GFR: Estimated Creatinine Clearance: 18.3 mL/min (A) (by C-G formula based on SCr of 1.05 mg/dL (H)).  Liver Function Tests: Recent Labs  Lab 01/06/21 1252  AST 22  ALT 11  ALKPHOS 81  BILITOT 0.7  PROT 6.6  ALBUMIN 3.3*    Urine analysis:    Component Value Date/Time  COLORURINE YELLOW 01/06/2021 1454   APPEARANCEUR CLOUDY (A) 01/06/2021 1454   APPEARANCEUR Clear 09/21/2011 0059   LABSPEC 1.020 01/06/2021 1454   LABSPEC 1.005 09/21/2011 0059   PHURINE 6.5 01/06/2021 1454   GLUCOSEU NEGATIVE 01/06/2021 1454   GLUCOSEU Negative 09/21/2011 0059   HGBUR MODERATE (A) 01/06/2021 1454   BILIRUBINUR NEGATIVE 01/06/2021 1454   BILIRUBINUR Negative 09/21/2011 0059   KETONESUR NEGATIVE 01/06/2021 1454   PROTEINUR 100 (A) 01/06/2021 1454   NITRITE NEGATIVE 01/06/2021 1454   LEUKOCYTESUR LARGE (A) 01/06/2021 1454   LEUKOCYTESUR 1+ 09/21/2011 0059    Radiological Exams on Admission: DG Chest Portable 1 View  Result Date: 01/06/2021 CLINICAL DATA:  Shortness of breath. EXAM: PORTABLE CHEST 1 VIEW COMPARISON:  03/19/2020 FINDINGS: The lungs are clear without focal pneumonia, edema, pneumothorax or pleural effusion. Interstitial markings are diffusely coarsened with chronic features. The cardio pericardial silhouette is enlarged. The visualized bony structures of the thorax show no acute abnormality. Telemetry leads overlie the chest. IMPRESSION: Stable.  No acute cardiopulmonary findings. Electronically Signed   By: Misty Stanley M.D.   On: 01/06/2021 13:42    EKG: Independently reviewed.   Assessment/Plan Principal Problem:   Diarrhea Active Problems:   Essential hypertension, benign   COVID-19 virus infection   Hypoglycemia without diagnosis of diabetes mellitus 87 year old female with a history of CVA, hypertension, diastolic heart failure presents with cough, generalized weakness and  diarrhea and hypoglycemia.  Patient is essentially found to be COVID-positive but sats within normal limits. Diarrhea-potentially gastroenteritis, will give supportive measures IV fluids Follow-up GI panel-1-2 bowel movements not greater than 3 at this time per grandson Mr. [name]  COVID-positive-contact precautions, maintaining sats therefore we will hold remdesivir and Decadron  Hypoglycemia 57 repeats 130s after D10, secondary to decreased p.o. intake, will give D5 normal saline and continue to monitor and encourage p.o. Hypoglycemia protocol, patient eats regular diet per grandson  Generalized weakness-deconditioning secondary to above we will get PT OT consult  Hypertension we will continue home Lasix and Entresto  Diastolic congestive heart failure-we will continue meds, and encourage p.o. and give gentle IV fluids, monitor for overload  Disposition-observation, telemetry, continue to monitor  DVT prophylaxis: SCD Code Status:   Full Family Communication:  Mr. Fabian November at [phone] Disposition Plan:   Patient is from:  Home  Anticipated DC to:  Home  Anticipated DC date:  1 to 2 days  Anticipated DC barriers: P.o. intake and diarrhea resolving  Consults called:  na Admission status:  observation  Severity of Illness: The appropriate patient status for this patient is OBSERVATION. Observation status is judged to be reasonable and necessary in order to provide the required intensity of service to ensure the patient's safety. The patient's presenting symptoms, physical exam findings, and initial radiographic and laboratory data in the context of their medical condition is felt to place them at decreased risk for further clinical deterioration. Furthermore, it is anticipated that the patient will be medically stable for discharge from the hospital within 2 midnights of admission. The following factors support the patient status of observation.   " The patient's presenting  symptoms include as above. " The physical exam findings include as above. " The initial radiographic and laboratory data are as above.    Jacqlyn Krauss MD [facility] [phone] How to contact the [facility] Attending or Consulting provider [place] or covering provider during after hours [place], for this patient?   Check the care team in  [facility] and look for a) attending/consulting [facility] provider listed and b) the [place] team listed Log into [email or web address] and use [place]'s universal password to access. If you do not have the password, please contact the hospital operator. Locate the [facility] provider you are looking for under [facility] and page to a number that you can be directly reached. If you still have difficulty reaching the provider, please page the [facility] (Director on Call) for the Hospitalists listed on amion for assistance.  01/06/2021, 4:19 PM

## 2021-01-06 NOTE — ED Notes (Signed)
Ashley RN aware of assigned bed 

## 2021-01-06 NOTE — ED Notes (Signed)
Pt provided apple juice, graham crackers, and peanut butter at this time. D5NS running. Will continue to monitor blood sugar.

## 2021-01-06 NOTE — ED Provider Notes (Signed)
[facility]  ____________________________________________   Event Date/Time   First MD Initiated Contact with Patient 01/06/21 1236     (approximate)  I have reviewed the triage vital signs and the nursing notes.   HISTORY  Chief Complaint Diarrhea (Pt has diarrhea x 2 days and increased confusion. NAD on arrival.)    HPI VENESA SEMIDEY is a 87 y.o. female past medical history of past medical history of CVA, CHF with normal ejection fraction, hypertension, hyperlipidemia, depression who presents with diarrhea and coughing.  Patient denies any complaints at this time.  She does admit to having several episodes of diarrhea without blood in it this morning.  She denies chest pain shortness of breath or abdominal pain.  Denies fevers or chills.  She tells me that she was feeling somewhat confused but that this is normal for her.  Patient's grandson tells me that she was coughing up some phlegm today and seemed to have difficulty coughing.  Then had several episodes of diarrhea and just was a little bit more confused than normal.  Notes that she is at her baseline at this time.         Past Medical History:  Diagnosis Date   Allergy    Anemia    Arrhythmia    CHF (congestive heart failure) ([place])    CVA (cerebral vascular accident) ([place])    Depression    GERD (gastroesophageal reflux disease)    History of blood transfusion    History of kidney stones    Hx: UTI (urinary tract infection)    Hypercholesterolemia    Hypertension     Patient Active Problem List   Diagnosis Date Noted   Diarrhea 01/06/2021   COVID-19 virus infection 01/06/2021   Hypoglycemia without diagnosis of diabetes mellitus 01/06/2021   COPD (chronic obstructive pulmonary disease) ([place]) 11/03/2020   Aortic atherosclerosis ([place]) 11/02/2020   Hypertensive kidney disease with CKD (chronic kidney disease) stage V ([place]) 11/02/2020   CKD (chronic kidney disease), stage III ([place])  11/02/2020   Incontinence 11/02/2020   Conjunctivitis 10/03/2020   Discoloration of skin 07/25/2020   Bad odor of urine 05/11/2020   Bradycardia 03/29/2020   Sebaceous cyst 03/29/2020   Weakness 03/29/2020   UTI (urinary tract infection) 03/19/2020   NSTEMI (non-ST elevated myocardial infarction) ([place]) 03/19/2020   Chronic diastolic CHF (congestive heart failure) ([place])    B12 deficiency 10/13/2019   Skin lesion 08/19/2019   Acute exacerbation of CHF (congestive heart failure) ([place]) 07/22/2019   Acute respiratory failure with hypoxia ([place]) 07/22/2019   Leg swelling 07/22/2019   Atrial fibrillation ([place]) 07/22/2019   Fall at home, initial encounter 07/22/2019   Volume overload 07/19/2019   Hypertensive urgency 16/57/9038   Acute metabolic encephalopathy 33/38/3291   Elevated troponin 05/18/2019   Hypokalemia 05/18/2019   URI (upper respiratory infection) 08/26/2018   Headache 07/17/2017   SOB (shortness of breath) 03/28/2016   Near syncope 03/01/2016   Anemia 10/28/2015   Lower GI bleed    Goals of care, counseling/discussion    Blood loss anemia 06/01/2015   GERD (gastroesophageal reflux disease)    Mild depression ([place])    Unsteady gait 02/08/2015   Schwannoma 10/07/2013   Rectal bleeding 04/03/2013   Light headedness 01/19/2013   Loss of weight 10/22/2012   Hypercholesterolemia 10/22/2012   History of CVA (cerebrovascular accident) 10/22/2012   Anxiety 10/22/2012   Essential hypertension, benign 10/22/2012    Past Surgical History:  Procedure Laterality Date  CHOLECYSTECTOMY  1984   NECK LESION BIOPSY  2015   Followed by Dr. Richardson Landry    Prior to Admission medications   Medication Sig Start Date End Date Taking? Authorizing Provider  aspirin EC 81 MG EC tablet Take 1 tablet (81 mg total) by mouth daily. Patient not taking: Reported on 12/09/2020 05/20/19   Lavina Hamman, MD  erythromycin ophthalmic ointment Use one half inch four times daily to affected eye (s) x  7 days. Patient not taking: Reported on 12/09/2020 10/03/20   Burnard Hawthorne, FNP  furosemide (LASIX) 40 MG tablet Take 0.5 tablets (20 mg total) by mouth daily. 09/13/19   [name], MD  hydrocortisone (ANUSOL-HC) 25 MG suppository Place 1 suppository (25 mg total) rectally 2 (two) times daily. 10/31/20   [name], MD  hydrocortisone 2.5 % cream Apply topically 2 (two) times daily as needed. Apply to irritated area on rectum bid prn.  Use no more than 7-10 days in same location. 11/14/20   [name], MD  potassium chloride (KLOR-CON) 10 MEQ tablet TAKE 1 TABLET BY MOUTH TWICE A DAY FOR 4 DAYS THEN STOP 01/06/21   [name], MD  sacubitril-valsartan (ENTRESTO) 49-51 MG Take 0.5 tablets by mouth 2 (two) times daily. 09/29/20   Alisa Graff, FNP    Allergies Dyazide [hydrochlorothiazide w-triamterene], Toprol xl [metoprolol tartrate], Monopril [fosinopril], and Verapamil  Family History  Problem Relation Age of Onset   Cancer Mother        unknown type   Hypertension Mother    Cancer Father        unknown type   Cancer Daughter    Colon cancer Other        grandfather    Social History Social History   Tobacco Use   Smoking status: Former   Smokeless tobacco: Former    Types: Chew  Substance Use Topics   Alcohol use: No    Alcohol/week: 0.0 standard drinks   Drug use: No    Review of Systems   Review of Systems  Constitutional:  Negative for chills, fatigue and fever.  Respiratory:  Negative for shortness of breath.   Cardiovascular:  Negative for chest pain.  Gastrointestinal:  Positive for diarrhea. Negative for abdominal pain, nausea and vomiting.  Genitourinary:  Negative for dysuria.  Psychiatric/Behavioral:  Positive for confusion.   All other systems reviewed and are negative.  Physical Exam Updated Vital Signs BP (!) 143/45   Pulse 99   Temp 98.4 F (36.9 C) (Oral)   Resp (!) 21   Ht 5\' 6"  (1.676 m)   Wt 40.8 kg   SpO2 95%   BMI  14.53 kg/m   Physical Exam Vitals and nursing note reviewed.  Constitutional:      General: She is not in acute distress.    Appearance: Normal appearance.     Comments: Very thin  HENT:     Head: Normocephalic and atraumatic.     Comments: Supraclavicular mass, nontender, immobile, chronic    Mouth/Throat:     Mouth: Mucous membranes are dry.     Comments: Dry mucous membranes Eyes:     General: No scleral icterus.    Conjunctiva/sclera: Conjunctivae normal.  Pulmonary:     Effort: Pulmonary effort is normal. No respiratory distress.     Breath sounds: No stridor. No wheezing or rales.  Abdominal:     General: Abdomen is flat. There is no distension.     Palpations: Abdomen is  soft.     Tenderness: There is no abdominal tenderness. There is no guarding.  Musculoskeletal:        General: No deformity or signs of injury.     Cervical back: Normal range of motion.  Skin:    General: Skin is dry.     Coloration: Skin is not jaundiced or pale.  Neurological:     General: No focal deficit present.     Mental Status: She is alert and oriented to person, place, and time. Mental status is at baseline.  Psychiatric:        Mood and Affect: Mood normal.        Behavior: Behavior normal.     LABS (all labs ordered are listed, but only abnormal results are displayed)  Labs Reviewed  RESP PANEL BY RT-PCR (FLU A&B, COVID) ARPGX2 - Abnormal; Notable for the following components:      Result Value   SARS Coronavirus 2 by RT PCR POSITIVE (*)    All other components within normal limits  COMPREHENSIVE METABOLIC PANEL - Abnormal; Notable for the following components:   Glucose, Bld 144 (*)    BUN 28 (*)    Creatinine, Ser 1.05 (*)    Calcium 8.4 (*)    Albumin 3.3 (*)    GFR, Estimated 47 (*)    All other components within normal limits  CBC WITH DIFFERENTIAL/PLATELET - Abnormal; Notable for the following components:   WBC 3.2 (*)    HCT 46.2 (*)    All other components within  normal limits  URINALYSIS, COMPLETE (UACMP) WITH MICROSCOPIC - Abnormal; Notable for the following components:   APPearance CLOUDY (*)    Hgb urine dipstick MODERATE (*)    Protein, ur 100 (*)    Leukocytes,Ua LARGE (*)    Bacteria, UA MANY (*)    All other components within normal limits  CBG MONITORING, ED - Abnormal; Notable for the following components:   Glucose-Capillary 57 (*)    All other components within normal limits  CBG MONITORING, ED - Abnormal; Notable for the following components:   Glucose-Capillary 134 (*)    All other components within normal limits  TROPONIN I (HIGH SENSITIVITY) - Abnormal; Notable for the following components:   Troponin I (High Sensitivity) 43 (*)    All other components within normal limits  URINE CULTURE   ____________________________________________  EKG  Normal sinus rhythm with premature atrial complexes, normal axis, normal intervals, no acute ischemic changes, LVH ____________________________________________  RADIOLOGY I, Madelin Headings, personally viewed and evaluated these images (plain radiographs) as part of my medical decision making, as well as reviewing the written report by the radiologist.  ED MD interpretation: I reviewed the chest x-ray which shows increased interstitial markings bilaterally    ____________________________________________   PROCEDURES  Procedure(s) performed (including Critical Care):  Procedures   ____________________________________________   INITIAL IMPRESSION / ASSESSMENT AND PLAN / ED COURSE     87 year old female presenting with coughing and diarrhea today.  Patient tells me she was may be confused and her son agrees she seemed to be staring off today however currently she is alert and oriented and although she appears elderly and thin is not in any distress.  Her abdomen is soft and nontender.  She is somewhat dry.  Initial sugar is 57 but she did not eat today and I suspect she has  very low reserves.  Will give 200 of D10 and feed her.  Patient has a COVID  exposure and with the diarrhea and coughing we will check for COVID check chest x-ray and labs.  We will also give her a liter of fluid and she appears dry.  Her chest x-ray is read as normal by radiology however does look to me like there could be an atypical pneumonia versus viral pneumonia.  She is COVID-positive notably.  Labs notable for leukopenia otherwise troponin is elevated at 42 but she denies chest pain and has no EKG changes to suggest acute MI.  After D10 and p.o. her serial Accu-Cheks have been normal.  UA is also positive, unclear if this represents asymptomatic bacteriuria or true UTI.  Given she is more weak than normal will treat with Rocephin.  I discussed the potential for discharge in observation at home versus inpatient admission with her grand son.  He notes that her caretaker is also sick at this time and feels like she be best served to be in the hospital.  Given her age I think this is reasonable.  She will be admitted for observation.  Clinical Course as of 01/06/21 1612  Tue Jan 06, 2021  1356 SARS Coronavirus 2 by RT PCR(!): POSITIVE [KM]  1429 Glucose-Capillary(!): 134 [KM]  1518 Leukocytes,Ua(!): LARGE [KM]  1519 WBC, UA: >50 [KM]  1522 Troponin I (High Sensitivity)(!): 43 [KM]    Clinical Course User Index [KM] Rada Hay, MD     ____________________________________________   FINAL CLINICAL IMPRESSION(S) / ED DIAGNOSES  Final diagnoses:  [id number]     ED Discharge Orders     None        Note:  This document was prepared using Dragon voice recognition software and may include unintentional dictation errors.    Rada Hay, MD 01/06/21 [phone]

## 2021-01-06 NOTE — ED Notes (Signed)
Dinner tray given to transport services.

## 2021-01-06 NOTE — ED Notes (Signed)
Pt placed on bedpan at this time. Pt urinated but no BM noted.

## 2021-01-07 DIAGNOSIS — D696 Thrombocytopenia, unspecified: Secondary | ICD-10-CM

## 2021-01-07 DIAGNOSIS — E162 Hypoglycemia, unspecified: Secondary | ICD-10-CM | POA: Diagnosis present

## 2021-01-07 DIAGNOSIS — R197 Diarrhea, unspecified: Secondary | ICD-10-CM | POA: Diagnosis present

## 2021-01-07 DIAGNOSIS — I5032 Chronic diastolic (congestive) heart failure: Secondary | ICD-10-CM | POA: Diagnosis present

## 2021-01-07 DIAGNOSIS — A0839 Other viral enteritis: Secondary | ICD-10-CM | POA: Diagnosis present

## 2021-01-07 DIAGNOSIS — Z79899 Other long term (current) drug therapy: Secondary | ICD-10-CM | POA: Diagnosis not present

## 2021-01-07 DIAGNOSIS — Z888 Allergy status to other drugs, medicaments and biological substances status: Secondary | ICD-10-CM | POA: Diagnosis not present

## 2021-01-07 DIAGNOSIS — U071 COVID-19: Secondary | ICD-10-CM | POA: Diagnosis present

## 2021-01-07 DIAGNOSIS — E7800 Pure hypercholesterolemia, unspecified: Secondary | ICD-10-CM | POA: Diagnosis present

## 2021-01-07 DIAGNOSIS — Z8249 Family history of ischemic heart disease and other diseases of the circulatory system: Secondary | ICD-10-CM | POA: Diagnosis not present

## 2021-01-07 DIAGNOSIS — I110 Hypertensive heart disease with heart failure: Secondary | ICD-10-CM | POA: Diagnosis present

## 2021-01-07 DIAGNOSIS — Z9049 Acquired absence of other specified parts of digestive tract: Secondary | ICD-10-CM | POA: Diagnosis not present

## 2021-01-07 DIAGNOSIS — Z7982 Long term (current) use of aspirin: Secondary | ICD-10-CM | POA: Diagnosis not present

## 2021-01-07 DIAGNOSIS — Z800 Family history of malignant neoplasm of digestive organs: Secondary | ICD-10-CM | POA: Diagnosis not present

## 2021-01-07 DIAGNOSIS — Z87891 Personal history of nicotine dependence: Secondary | ICD-10-CM | POA: Diagnosis not present

## 2021-01-07 DIAGNOSIS — I10 Essential (primary) hypertension: Secondary | ICD-10-CM | POA: Diagnosis not present

## 2021-01-07 DIAGNOSIS — Z28310 Unvaccinated for covid-19: Secondary | ICD-10-CM | POA: Diagnosis not present

## 2021-01-07 DIAGNOSIS — G9349 Other encephalopathy: Secondary | ICD-10-CM | POA: Diagnosis present

## 2021-01-07 DIAGNOSIS — R531 Weakness: Secondary | ICD-10-CM | POA: Diagnosis present

## 2021-01-07 DIAGNOSIS — Z8673 Personal history of transient ischemic attack (TIA), and cerebral infarction without residual deficits: Secondary | ICD-10-CM | POA: Diagnosis not present

## 2021-01-07 DIAGNOSIS — F32A Depression, unspecified: Secondary | ICD-10-CM | POA: Diagnosis present

## 2021-01-07 LAB — CBC WITH DIFFERENTIAL/PLATELET
Abs Immature Granulocytes: 0.01 10*3/uL (ref 0.00–0.07)
Basophils Absolute: 0 10*3/uL (ref 0.0–0.1)
Basophils Relative: 0 %
Eosinophils Absolute: 0 10*3/uL (ref 0.0–0.5)
Eosinophils Relative: 0 %
HCT: 39 % (ref 36.0–46.0)
Hemoglobin: 12.6 g/dL (ref 12.0–15.0)
Immature Granulocytes: 0 %
Lymphocytes Relative: 18 %
Lymphs Abs: 0.8 10*3/uL (ref 0.7–4.0)
MCH: 29.1 pg (ref 26.0–34.0)
MCHC: 32.3 g/dL (ref 30.0–36.0)
MCV: 90.1 fL (ref 80.0–100.0)
Monocytes Absolute: 0.5 10*3/uL (ref 0.1–1.0)
Monocytes Relative: 11 %
Neutro Abs: 3.1 10*3/uL (ref 1.7–7.7)
Neutrophils Relative %: 71 %
Platelets: 145 10*3/uL — ABNORMAL LOW (ref 150–400)
RBC: 4.33 MIL/uL (ref 3.87–5.11)
RDW: 15.1 % (ref 11.5–15.5)
WBC: 4.4 10*3/uL (ref 4.0–10.5)
nRBC: 0 % (ref 0.0–0.2)

## 2021-01-07 LAB — HEMOGLOBIN A1C
Hgb A1c MFr Bld: 5.5 % (ref 4.8–5.6)
Mean Plasma Glucose: 111.15 mg/dL

## 2021-01-07 LAB — COMPREHENSIVE METABOLIC PANEL
ALT: 11 U/L (ref 0–44)
AST: 19 U/L (ref 15–41)
Albumin: 2.8 g/dL — ABNORMAL LOW (ref 3.5–5.0)
Alkaline Phosphatase: 63 U/L (ref 38–126)
Anion gap: 5 (ref 5–15)
BUN: 28 mg/dL — ABNORMAL HIGH (ref 8–23)
CO2: 28 mmol/L (ref 22–32)
Calcium: 7.9 mg/dL — ABNORMAL LOW (ref 8.9–10.3)
Chloride: 105 mmol/L (ref 98–111)
Creatinine, Ser: 0.79 mg/dL (ref 0.44–1.00)
GFR, Estimated: >60 mL/min (ref >60)
Glucose, Bld: 95 mg/dL (ref 70–99)
Potassium: 3.8 mmol/L (ref 3.5–5.1)
Sodium: 138 mmol/L (ref 135–145)
Total Bilirubin: 0.6 mg/dL (ref 0.3–1.2)
Total Protein: 6 g/dL — ABNORMAL LOW (ref 6.5–8.1)

## 2021-01-07 MED ORDER — ALBUTEROL SULFATE (2.5 MG/3ML) 0.083% IN NEBU: 2.5000 mg | INHALATION_SOLUTION | RESPIRATORY_TRACT | Status: DC | PRN

## 2021-01-07 MED ORDER — ENOXAPARIN SODIUM 30 MG/0.3ML IJ SOSY
30.0000 mg | PREFILLED_SYRINGE | Freq: Every day | INTRAMUSCULAR | Status: DC
  Administered 2021-01-07: 22:00:00 30 mg via SUBCUTANEOUS
  Filled 2021-01-07: qty 0.3

## 2021-01-07 MED ORDER — ALBUTEROL SULFATE HFA 108 (90 BASE) MCG/ACT IN AERS
1.0000 | INHALATION_SPRAY | RESPIRATORY_TRACT | Status: DC | PRN
Start: 2021-01-07 — End: 2021-01-08
  Filled 2021-01-07: qty 6.7

## 2021-01-07 NOTE — Progress Notes (Signed)
PROGRESS NOTE    Taylor Watson  OJJ:[id number] DOB: 10/19/1918 DOA: 01/06/2021 PCP: [name], MD   Assessment & Plan:   Principal Problem:   Diarrhea Active Problems:   Essential hypertension, benign   COVID-19 virus infection   Hypoglycemia without diagnosis of diabetes mellitus   Diarrhea: potentially gastroenteritis from [place]. Continue on IVFs.     COVID19 infection: continue on airborne & contract precautions. Maintaining sats therefore we will hold remdesivir and steroids   Hypoglycemia: no hx of DM. WNL today. Continue on D5NS    Generalized weakness: OT/PT consulted    HTN: continue on home dose of entresto. Continue to hold home dose of lasix.    Chronic diastolic CHF: continue on home dose of entresto. Continue to hold lasix   Thrombocytopenia: etiology unclear. Will continue to monitor    DVT prophylaxis: lovenox  Code Status: full Family Communication: called pt's daughter and grandson but no answer Disposition Plan: SNF vs HH  Level of care: Med-Surg  Status is: Observation  The patient remains OBS appropriate and will d/c before 2 midnights.  Dispo: The patient is from: Home              Anticipated d/c is to: Home vs SNF              Patient currently is not medically stable to d/c.   Difficult to place patient : unclear    Consultants:    Procedures:   Antimicrobials:   Subjective: Pt c/o fatigue   Objective: Vitals:   01/06/21 1818 01/06/21 2153 01/07/21 0009 01/07/21 0631  BP:  (!) 151/60 131/66 122/68  Pulse:  (!) 58 (!) 58 (!) 56  Resp:  18 20 18   Temp:  98.6 F (37 C) 98.7 F (37.1 C) 97.8 F (36.6 C)  TempSrc:   Oral   SpO2:  98% 94% 93%  Weight: 44.6 kg     Height: 5\' 6"  (1.676 m)       Intake/Output Summary (Last 24 hours) at 01/07/2021 0808 Last data filed at 01/07/2021 0049 Gross per 24 hour  Intake 1688.44 ml  Output --  Net 1688.44 ml   Filed Weights   01/06/21 1248 01/06/21 1818  Weight: 40.8  kg 44.6 kg    Examination:  General exam: Appears calm and comfortable  Respiratory system: Clear to auscultation. Respiratory effort normal. Cardiovascular system: S1 & S2+. No rubs, gallops or clicks. No pedal edema. Gastrointestinal system: Abdomen is nondistended, soft and nontender. Normal bowel sounds heard. Central nervous system: Alert and awake. Moves all extremities  Psychiatry: Judgement and insight appear poor. Flat mood and affect    Data Reviewed: I have personally reviewed following labs and imaging studies  CBC: Recent Labs  Lab 01/06/21 1252 01/07/21 0431  WBC 3.2* 4.4  NEUTROABS 1.8 3.1  HGB 14.6 12.6  HCT 46.2* 39.0  MCV 90.9 90.1  PLT 163 937*   Basic Metabolic Panel: Recent Labs  Lab 01/06/21 1252 01/07/21 0431  NA 136 138  K 4.4 3.8  CL 101 105  CO2 28 28  GLUCOSE 144* 95  BUN 28* 28*  CREATININE 1.05* 0.79  CALCIUM 8.4* 7.9*   GFR: Estimated Creatinine Clearance: 26.3 mL/min (by C-G formula based on SCr of 0.79 mg/dL). Liver Function Tests: Recent Labs  Lab 01/06/21 1252 01/07/21 0431  AST 22 19  ALT 11 11  ALKPHOS 81 63  BILITOT 0.7 0.6  PROT 6.6 6.0*  ALBUMIN 3.3* 2.8*  No results for input(s): LIPASE, AMYLASE in the last 168 hours. No results for input(s): AMMONIA in the last 168 hours. Coagulation Profile: No results for input(s): INR, PROTIME in the last 168 hours. Cardiac Enzymes: No results for input(s): CKTOTAL, CKMB, CKMBINDEX, TROPONINI in the last 168 hours. BNP (last 3 results) No results for input(s): PROBNP in the last 8760 hours. HbA1C: No results for input(s): HGBA1C in the last 72 hours. CBG: Recent Labs  Lab 01/06/21 1247 01/06/21 1423 01/06/21 1634 01/06/21 1750  GLUCAP 57* 134* 50* 81   Lipid Profile: No results for input(s): CHOL, HDL, LDLCALC, TRIG, CHOLHDL, LDLDIRECT in the last 72 hours. Thyroid Function Tests: No results for input(s): TSH, T4TOTAL, FREET4, T3FREE, THYROIDAB in the last 72  hours. Anemia Panel: No results for input(s): VITAMINB12, FOLATE, FERRITIN, TIBC, IRON, RETICCTPCT in the last 72 hours. Sepsis Labs: No results for input(s): PROCALCITON, LATICACIDVEN in the last 168 hours.  Recent Results (from the past 240 hour(s))  Resp Panel by RT-PCR (Flu A&B, Covid) Nasopharyngeal Swab     Status: Abnormal   Collection Time: 01/06/21 12:52 PM   Specimen: Nasopharyngeal Swab; Nasopharyngeal(NP) swabs in vial transport medium  Result Value Ref Range Status   SARS Coronavirus 2 by RT PCR POSITIVE (A) NEGATIVE Final    Comment: RESULT CALLED TO, READ BACK BY AND VERIFIED WITH: KATIE FERGUSON 01/06/21 1352 KLW (NOTE) SARS-CoV-2 target nucleic acids are DETECTED.  The SARS-CoV-2 RNA is generally detectable in upper respiratory specimens during the acute phase of infection. Positive results are indicative of the presence of the identified virus, but do not rule out bacterial infection or co-infection with other pathogens not detected by the test. Clinical correlation with patient history and other diagnostic information is necessary to determine patient infection status. The expected result is Negative.  Fact Sheet for Patients: EntrepreneurPulse.com.au  Fact Sheet for Healthcare Providers: IncredibleEmployment.be  This test is not yet approved or cleared by the [place] FDA and  has been authorized for detection and/or diagnosis of SARS-CoV-2 by FDA under an Emergency Use Authorization (EUA).  This EUA will remain in effect (meaning this test can be [place] ed) for the duration of  the COVID-19 declaration under Section 564(b)(1) of the Act, 21 U.S.C. section 360bbb-3(b)(1), unless the authorization is terminated or revoked sooner.     Influenza A by PCR NEGATIVE NEGATIVE Final   Influenza B by PCR NEGATIVE NEGATIVE Final    Comment: (NOTE) The Xpert Xpress SARS-CoV-2/FLU/RSV plus assay is intended as an aid in the  diagnosis of influenza from Nasopharyngeal swab specimens and should not be used as a sole basis for treatment. Nasal washings and aspirates are unacceptable for Xpert Xpress SARS-CoV-2/FLU/RSV testing.  Fact Sheet for Patients: EntrepreneurPulse.com.au  Fact Sheet for Healthcare Providers: IncredibleEmployment.be  This test is not yet approved or cleared by the [place] FDA and has been authorized for detection and/or diagnosis of SARS-CoV-2 by FDA under an Emergency Use Authorization (EUA). This EUA will remain in effect (meaning this test can be used) for the duration of the COVID-19 declaration under Section 564(b)(1) of the Act, 21 U.S.C. section 360bbb-3(b)(1), unless the authorization is terminated or revoked.  Performed at [facility], [address], [place], [place] [address]          Radiology Studies: DG Chest Portable 1 View  Result Date: 01/06/2021 CLINICAL DATA:  Shortness of breath. EXAM: PORTABLE CHEST 1 VIEW COMPARISON:  03/19/2020 FINDINGS: The lungs are clear without focal  pneumonia, edema, pneumothorax or pleural effusion. Interstitial markings are diffusely coarsened with chronic features. The cardio pericardial silhouette is enlarged. The visualized bony structures of the thorax show no acute abnormality. Telemetry leads overlie the chest. IMPRESSION: Stable.  No acute cardiopulmonary findings. Electronically Signed   By: Misty Stanley M.D.   On: 01/06/2021 13:42        Scheduled Meds:  potassium chloride  10 mEq Oral Daily   sacubitril-valsartan  0.5 tablet Oral BID   Continuous Infusions:  dextrose Stopped (01/06/21 1540)   dextrose 5 % and 0.9% NaCl 100 mL/hr at 01/07/21 0118     LOS: 0 days    Time spent: 31 mins    Wyvonnia Dusky, MD [facility] Pager [phone]  If 7PM-7AM, please contact night-coverage 01/07/2021, 8:08 AM

## 2021-01-07 NOTE — Evaluation (Signed)
Physical Therapy Evaluation Patient Details Name: Taylor Watson MRN: [id number] DOB: [birth year] Today's Date: 01/07/2021   History of Present Illness  Pt is a 87 y.o. female with medical history significant for diastolic CHF, CVA, hyperlipidemia, depression, and hypertension who presented with cough and diarrhea over a couple of days. Pt tested positive for COVID-19 upon arrival to the ED.  MD assessment also includes: hypoglycemia, generalized weakness, and thrombocytopenia.   Clinical Impression  Pt was pleasant and motivated to participate during the session and put forth good effort throughout the session.  Pt required extra time and effort with functional tasks but no physical assistance.  Pt was generally steady with transfers and gait and was able to amb a max of around 12 feet before returning to sitting with SpO2 and HR WNL.  Pt will benefit from PT services in a SNF setting upon discharge to safely address deficits listed in patient problem list for decreased caregiver assistance and eventual return to PLOF.      Follow Up Recommendations SNF;Supervision/Assistance - 24 hour    Equipment Recommendations  None recommended by PT    Recommendations for Other Services       Precautions / Restrictions Precautions Precautions: Fall Restrictions Weight Bearing Restrictions: No      Mobility  Bed Mobility Overal bed mobility: Needs Assistance Bed Mobility: Rolling;Supine to Sit Rolling: Supervision   Supine to sit: Supervision     General bed mobility comments: Extra time, effort, use of rail, and cues for sequencing    Transfers Overall transfer level: Needs assistance Equipment used: Rolling walker (2 wheeled) Transfers: Sit to/from Stand Sit to Stand: From elevated surface;Min assist Stand pivot transfers: Min guard       General transfer comment: Mod verbal cues for hand placement and increased trunk flexion  Ambulation/Gait Ambulation/Gait assistance:  Min guard Gait Distance (Feet): 12 Feet Assistive device: Rolling walker (2 wheeled) Gait Pattern/deviations: Step-through pattern;Decreased step length - right;Decreased step length - left;Trunk flexed Gait velocity: decreased   General Gait Details: Pt able to amb 12 ft in the room with very slow cadence and short B step length but was steady without LOB  Stairs            Wheelchair Mobility    Modified Rankin (Stroke Patients Only)       Balance Overall balance assessment: Needs assistance Sitting-balance support: Feet supported;No upper extremity supported Sitting balance-Leahy Scale: Good Sitting balance - Comments: Steady static sitting EOB. Reaches within BOS.   Standing balance support: During functional activity;Bilateral upper extremity supported Standing balance-Leahy Scale: Fair Standing balance comment: Min to mod lean on the RW for support but no LOB                             Pertinent Vitals/Pain Pain Assessment: No/denies pain    Home Living Family/patient expects to be discharged to:: Private residence Living Arrangements: Children Available Help at Discharge: Family;Available PRN/intermittently;Other (Comment) (Pt's daughter is sick and grandson assists intermittently) Type of Home: Mobile home Home Access: Stairs to enter Entrance Stairs-Rails: Right Entrance Stairs-Number of Steps: 3 Home Layout: One level Home Equipment: Walker - 4 wheels;Cane - single point      Prior Function Level of Independence: Independent with assistive device(s)         Comments: Uses 4WW for functional mobility. Pt denies falls in last 6 months. Dtr assists with IADL management including driving and cooking. Pt  states she is able to dress and sponge bathe without assistance.     Hand Dominance   Dominant Hand: Right    Extremity/Trunk Assessment   Upper Extremity Assessment Upper Extremity Assessment: Generalized weakness    Lower  Extremity Assessment Lower Extremity Assessment: Generalized weakness    Cervical / Trunk Assessment Cervical / Trunk Assessment: Kyphotic  Communication   Communication: No difficulties  Cognition Arousal/Alertness: Awake/alert Behavior During Therapy: WFL for tasks assessed/performed Overall Cognitive Status: Within Functional Limits for tasks assessed                                 General Comments: Pt is alert and oriented to self, place, limited situation. She is able to follow 1-step VCs consistently during session. No family/caregiver present to determine baseline level of cognition.      General Comments General comments (skin integrity, edema, etc.): VSS t/o session. Pt denies SOB with functional activity. Remains on 2L O2 t/o session.    Exercises Other Exercises Other Exercises: Pt educated role of OT in acute setting, safe use of AE/DME for functional transfers, safe transfer techniques, falls prevention strategies for home and hospital. OT facilitates LB dressing task, bed mobility and functional transfer during session.   Assessment/Plan    PT Assessment Patient needs continued PT services  PT Problem List Decreased strength;Decreased activity tolerance;Decreased balance;Decreased mobility;Decreased knowledge of use of DME       PT Treatment Interventions DME instruction;Gait training;Stair training;Functional mobility training;Therapeutic activities;Therapeutic exercise;Balance training;Patient/family education    PT Goals (Current goals can be found in the Care Plan section)  Acute Rehab PT Goals Patient Stated Goal: To go home PT Goal Formulation: With patient Time For Goal Achievement: 01/20/21 Potential to Achieve Goals: Good    Frequency Min 2X/week   Barriers to discharge Inaccessible home environment;Decreased caregiver support      Co-evaluation               AM-PAC PT "6 Clicks" Mobility  Outcome Measure Help needed turning  from your back to your side while in a flat bed without using bedrails?: A Little Help needed moving from lying on your back to sitting on the side of a flat bed without using bedrails?: A Little Help needed moving to and from a bed to a chair (including a wheelchair)?: A Little Help needed standing up from a chair using your arms (e.g., wheelchair or bedside chair)?: A Little Help needed to walk in hospital room?: A Little Help needed climbing 3-5 steps with a railing? : A Lot 6 Click Score: 17    End of Session Equipment Utilized During Treatment: Gait belt Activity Tolerance: Patient tolerated treatment well Patient left: in chair;with call bell/phone within reach;with chair alarm set Nurse Communication: Mobility status PT Visit Diagnosis: Difficulty in walking, not elsewhere classified (R26.2);Muscle weakness (generalized) (M62.81)    Time: 8588-5027 PT Time Calculation (min) (ACUTE ONLY): 29 min   Charges:   PT Evaluation $PT Eval Moderate Complexity: 1 Mod          D. Scott Baley Lorimer PT, DPT 01/07/21, 3:40 PM

## 2021-01-07 NOTE — Evaluation (Signed)
Occupational Therapy Evaluation Patient Details Name: Taylor Watson MRN: [id number] DOB: 1919/01/18 Today's Date: 01/07/2021    History of Present Illness Per MD Notes: Taylor Watson is a 87 y.o. female with medical history significant of heart failure diastolic, CVA, hyperlipidemia, depression, hypertension presents with cough and diarrhea over the last couple days. Pt tested positive for COVID-19 upon arrival to the ED.   Clinical Impression   Taylor Watson was seen for OT evaluation this date. Prior to hospital admission, pt was mod I for ADL management. She endorses using a 4WW for functional mobility, dressing herself and sponge bathing independently. Pt lives with her daughter in a mobile home with ~3 STE. Pt endorses using home O2 intermittently but is unsure how much supplemental O2 she uses when it is needed. Currently pt demonstrates impairments as described below (See OT problem list) which functionally limit her ability to perform ADL/self-care tasks. Pt currently requires MIN A for functional mobility, MIN A for LB ADL management, and supervision for bed mobility.  Pt would benefit from skilled OT services to address noted impairments and functional limitations (see below for any additional details) in order to maximize safety and independence while minimizing falls risk and caregiver burden. Upon hospital discharge, recommend STR to maximize pt safety and return to PLOF.      Follow Up Recommendations  SNF    Equipment Recommendations  3 in 1 bedside commode    Recommendations for Other Services       Precautions / Restrictions Precautions Precautions: Fall Restrictions Weight Bearing Restrictions: No      Mobility Bed Mobility Overal bed mobility: Needs Assistance Bed Mobility: Rolling;Supine to Sit Rolling: Supervision   Supine to sit: Supervision;HOB elevated          Transfers Overall transfer level: Needs assistance   Transfers: Sit to/from  Stand;Stand Pivot Transfers Sit to Stand: Min assist Stand pivot transfers: Min guard       General transfer comment: RW to perform SPT to room recliner from EOB. Initial instability in standing, requires MIN A to come to full stand and maintain balance. Is able to progress to close CGA during session.    Balance Overall balance assessment: Needs assistance Sitting-balance support: Feet supported;No upper extremity supported Sitting balance-Leahy Scale: Fair Sitting balance - Comments: Steady static sitting EOB. Reaches within BOS.   Standing balance support: During functional activity;Bilateral upper extremity supported Standing balance-Leahy Scale: Poor                             ADL either performed or assessed with clinical judgement   ADL Overall ADL's : Needs assistance/impaired                                     Functional mobility during ADLs: Rolling walker;Min guard;Minimal assistance General ADL Comments: Pt presents with generalized weakness and decreased activity tolerance. She requires MIN A to don brief at bed level, MIN A for initial STS attempts but is able to progress to close CGA for brief transfer from EOB to recliner. Anticipate MIN A for additional LB ADL management     Vision Baseline Vision/History: 1 Wears glasses Ability to See in Adequate Light: 1 Impaired Patient Visual Report: No change from baseline       Perception     Praxis      Pertinent  Vitals/Pain Pain Assessment: No/denies pain     Hand Dominance Right   Extremity/Trunk Assessment Upper Extremity Assessment Upper Extremity Assessment: Generalized weakness (Pt grip WFL, AROM of BUE WFL, decreased FMC appreciated during functional tasks. Overall strength grossly 4/5 t/o.)   Lower Extremity Assessment Lower Extremity Assessment: Defer to PT evaluation;Generalized weakness   Cervical / Trunk Assessment Cervical / Trunk Assessment: Kyphotic    Communication Communication Communication: No difficulties   Cognition Arousal/Alertness: Awake/alert Behavior During Therapy: WFL for tasks assessed/performed Overall Cognitive Status: Within Functional Limits for tasks assessed                                 General Comments: Pt is alert and oriented to self, place, limited situation. She is able to follow 1-step VCs consistently during session. No family/caregiver present to determine baseline level of cognition.   General Comments  VSS t/o session. Pt denies SOB with functional activity. Remains on 2L O2 t/o session.    Exercises Other Exercises Other Exercises: Pt educated role of OT in acute setting, safe use of AE/DME for functional transfers, safe transfer techniques, falls prevention strategies for home and hospital. OT facilitates LB dressing task, bed mobility and functional transfer during session.   Shoulder Instructions      Home Living Family/patient expects to be discharged to:: Private residence Living Arrangements: Children Available Help at Discharge: Family;Available PRN/intermittently (Daughter sick, grandson assists PRN) Type of Home: Mobile home Home Access: Stairs to enter Entrance Stairs-Number of Steps: 3 Entrance Stairs-Rails: Right Home Layout: One level     Bathroom Shower/Tub: Tub only (Pt sponge bathes)     Bathroom Accessibility: Yes How Accessible: Accessible via walker Home Equipment: Walker - 4 wheels;Cane - single point          Prior Functioning/Environment Level of Independence: Independent with assistive device(s)        Comments: Uses 4WW for functional mobility. Pt denies falls in last 6 months. Dtr assists with IADL management including driving and cooking. Pt states she is able to dress and sponge bathe without assistance.        OT Problem List: Decreased strength;Decreased coordination;Cardiopulmonary status limiting activity;Decreased activity  tolerance;Decreased safety awareness;Impaired balance (sitting and/or standing);Decreased knowledge of use of DME or AE      OT Treatment/Interventions: Self-care/ADL training;Therapeutic exercise;DME and/or AE instruction;Patient/family education;Energy conservation;Therapeutic activities;Balance training    OT Goals(Current goals can be found in the care plan section) Acute Rehab OT Goals Patient Stated Goal: To go home OT Goal Formulation: With patient Time For Goal Achievement: 01/21/21 Potential to Achieve Goals: Good  OT Frequency: Min 1X/week   Barriers to D/C: Decreased caregiver support          Co-evaluation              AM-PAC OT "6 Clicks" Daily Activity     Outcome Measure Help from another person eating meals?: A Little Help from another person taking care of personal grooming?: A Little Help from another person toileting, which includes using toliet, bedpan, or urinal?: A Little Help from another person bathing (including washing, rinsing, drying)?: A Little Help from another person to put on and taking off regular upper body clothing?: A Little Help from another person to put on and taking off regular lower body clothing?: A Little 6 Click Score: 18   End of Session Equipment Utilized During Treatment: Rolling walker  Activity Tolerance: Patient  tolerated treatment well;No increased pain Patient left: in chair;with call bell/phone within reach;with chair alarm set;Other (comment) (With MD in room.)  OT Visit Diagnosis: Muscle weakness (generalized) (M62.81);Other abnormalities of gait and mobility (R26.89)                Time: 4098-2867 OT Time Calculation (min): 30 min Charges:  OT General Charges $OT Visit: 1 Visit OT Evaluation $OT Eval Moderate Complexity: 1 Mod OT Treatments $Self Care/Home Management : 8-22 mins  Shara Blazing, M.S., OTR/L Ascom: [phone] 01/07/21, 12:04 PM

## 2021-01-08 DIAGNOSIS — E43 Unspecified severe protein-calorie malnutrition: Secondary | ICD-10-CM | POA: Insufficient documentation

## 2021-01-08 DIAGNOSIS — I10 Essential (primary) hypertension: Secondary | ICD-10-CM

## 2021-01-08 LAB — CBC
HCT: 36.6 % (ref 36.0–46.0)
Hemoglobin: 11.7 g/dL — ABNORMAL LOW (ref 12.0–15.0)
MCH: 29.2 pg (ref 26.0–34.0)
MCHC: 32 g/dL (ref 30.0–36.0)
MCV: 91.3 fL (ref 80.0–100.0)
Platelets: 144 10*3/uL — ABNORMAL LOW (ref 150–400)
RBC: 4.01 MIL/uL (ref 3.87–5.11)
RDW: 15 % (ref 11.5–15.5)
WBC: 2.9 10*3/uL — ABNORMAL LOW (ref 4.0–10.5)
nRBC: 0 % (ref 0.0–0.2)

## 2021-01-08 LAB — BASIC METABOLIC PANEL
Anion gap: 3 — ABNORMAL LOW (ref 5–15)
BUN: 16 mg/dL (ref 8–23)
CO2: 27 mmol/L (ref 22–32)
Calcium: 7.8 mg/dL — ABNORMAL LOW (ref 8.9–10.3)
Chloride: 105 mmol/L (ref 98–111)
Creatinine, Ser: 0.68 mg/dL (ref 0.44–1.00)
GFR, Estimated: >60 mL/min (ref >60)
Glucose, Bld: 76 mg/dL (ref 70–99)
Potassium: 3.8 mmol/L (ref 3.5–5.1)
Sodium: 135 mmol/L (ref 135–145)

## 2021-01-08 LAB — URINE CULTURE: Culture: >=100000 — AB

## 2021-01-08 MED ORDER — ENSURE ENLIVE PO LIQD
237.0000 mL | Freq: Three times a day (TID) | ORAL | Status: DC
  Administered 2021-01-08: 12:00:00 237 mL via ORAL

## 2021-01-08 MED ORDER — ADULT MULTIVITAMIN W/MINERALS CH: 1.0000 | ORAL_TABLET | Freq: Every day | ORAL | Status: DC

## 2021-01-08 NOTE — Progress Notes (Signed)
Initial Nutrition Assessment  DOCUMENTATION CODES:  Severe malnutrition in context of chronic illness, Underweight  INTERVENTION:  Continue current diet as ordered, encourage PO intake MVI with minerals daily Ensure Enlive po TID, each supplement provides 350 kcal and 20 grams of protein  NUTRITION DIAGNOSIS:  Severe Malnutrition related to chronic illness (CHF) as evidenced by severe muscle depletion, severe fat depletion.  GOAL:  Patient will meet greater than or equal to 90% of their needs  MONITOR:  PO intake, Supplement acceptance, Weight trends  REASON FOR ASSESSMENT:  Malnutrition Screening Tool    ASSESSMENT:  87 y.o. female with medical history of CHF, CVA, GERD, HTN, HLD, and depression, presented to ED with cough and diarrhea over the last couple days. Pt lives with her grandson at baseline and daughter also assists with her care. Daughter recently dx with COVID19, pt found to also be positive in ED.  Pt resting in bed at the time of visit. NT in room entering vitals. Pt reports she is eating like she would be at home, NT does report that intake of meals has been minimal. Pt reports at home she eats "some snacks" and mostly drinks water. Does state that she likes ensure, will add TID to encourage adequate nutrition and discourage further weight loss.     5.9% weight loss noted in the last 3 months (6/3-9/6). Percent is not severe, however pt is underweight so any amount of weight loss is concerning. Severe muscle and fat deficits present on assessment.  Nutritionally Relevant Medications: Scheduled Meds:  potassium chloride  10 mEq Oral Daily   Labs Reviewed  NUTRITION - FOCUSED PHYSICAL EXAM: Flowsheet Row Most Recent Value  Orbital Region Severe depletion  Upper Arm Region Severe depletion  Thoracic and Lumbar Region Severe depletion  Buccal Region Severe depletion  Temple Region Moderate depletion  Clavicle Bone Region Severe depletion  Clavicle and Acromion  Bone Region Severe depletion  Scapular Bone Region Severe depletion  Dorsal Hand Severe depletion  Patellar Region Severe depletion  Anterior Thigh Region Severe depletion  Posterior Calf Region Severe depletion  Edema (RD Assessment) None  Hair Reviewed  Eyes Reviewed  Mouth Reviewed  Skin Reviewed  Nails Reviewed   Diet Order:   Diet Order             Diet - low sodium heart healthy           DIET DYS 3 Room service appropriate? Yes; Fluid consistency: Thin  Diet effective now                   EDUCATION NEEDS:  No education needs have been identified at this time  Skin:  Skin Assessment: Reviewed RN Assessment  Last BM:  9/7  Height:  Ht Readings from Last 1 Encounters:  01/06/21 5\' 6"  (1.676 m)   Weight:  Wt Readings from Last 1 Encounters:  01/08/21 49.6 kg   Ideal Body Weight:  59.1 kg  BMI:  Body mass index is 17.65 kg/m.  Estimated Nutritional Needs:  Kcal:  1400-1600 kcal/d Protein:  70-80 g/d Fluid:  1.5-1.8 L/d   Ranell Patrick, RD, LDN Clinical Dietitian Pager on [place]

## 2021-01-08 NOTE — TOC Initial Note (Signed)
Transition of Care [facility]) - Initial/Assessment Note    Patient Details  Name: Taylor Watson MRN: [id number] Date of Birth: 10/14/1918  [facility]) CM/SW Contact:    Shelbie Hutching, RN Phone Number: 01/08/2021, 9:48 AM  Clinical Narrative:                 Patient admitted to the hospital with [place].  RNCM was able to speak with patient's grandson via phone.  [name], reports that patient lives with his mother, [name].  Patient is on chronic O2 at home 3-4 L.  She walks with a walker at baseline and can even prepare simple meals like scramble eggs for breakfast.   PT is currently recommending SNF, [name] feels that patient would be better off at home with home health services.  Patient was able to move with minimal to no assist from PT and walked 12 feet, which would be from her room to the living area in the home.   RNCM will arranged home health services.  [facility] unable to accept referral, referral then given to [facility] with [facility]     Expected Discharge Plan: [place] Barriers to Discharge: Continued Medical Work up   Patient Goals and CMS Choice Patient states their goals for this hospitalization and ongoing recovery are:: Family thinks that patient would be better off at home with home health services. CMS Medicare.gov Compare Post Acute Care list provided to:: Patient Choice offered to / list presented to : Patient, Adult Children  Expected Discharge Plan and Services Expected Discharge Plan: [place]   Discharge Planning Services: CM Consult Post Acute Care Choice: [place] arrangements for the past 2 months: Single Family Home                 DME Arranged: N/A DME Agency: NA       HH Arranged: PT, OT [place] Agency: [place] ([facility]) Date HH Agency Contacted: 01/08/21 Time [place]: [phone] Representative spoke with at [place]: [place]  Arrangements/Services Living arrangements for the past 2 months: [place] with:: Adult Children Patient language and need for interpreter reviewed:: Yes Do you feel safe going back to the place where you live?: Yes      Need for Family Participation in Patient Care: Yes (Comment) Care giver support system in place?: Yes (comment) (daughter) Current home services: DME (rollator, walker, stool) Criminal Activity/Legal Involvement Pertinent to Current Situation/Hospitalization: No - Comment as needed  Activities of Daily Living Home Assistive Devices/Equipment: Environmental consultant (specify type) ADL Screening (condition at time of admission) Patient's cognitive ability adequate to safely complete daily activities?: No Is the patient deaf or have difficulty hearing?: Yes Does the patient have difficulty seeing, even when wearing glasses/contacts?: Yes Does the patient have difficulty concentrating, remembering, or making decisions?: Yes Patient able to express need for assistance with ADLs?: Yes Does the patient have difficulty dressing or bathing?: Yes Independently performs ADLs?: No Communication: Independent Dressing (OT): Needs assistance Is this a change from baseline?: Pre-admission baseline Grooming: Needs assistance Is this a change from baseline?: Pre-admission baseline Feeding: Independent Bathing: Needs assistance Is this a change from baseline?: Pre-admission baseline Toileting: Needs assistance Is this a change from baseline?: Pre-admission baseline In/Out Bed: Needs assistance Is this a change from baseline?: Pre-admission baseline Walks in Home: Needs assistance Is this a change from baseline?: Pre-admission baseline Does the patient have difficulty walking or  climbing stairs?: Yes Weakness of Legs: Both Weakness of Arms/Hands: None  Permission Sought/Granted Permission sought to share information with : Case Manager, Family Supports, Other (comment) Permission  granted to share information with : Yes, Verbal Permission Granted  Share Information with NAME: Augustin Coupe  Permission granted to share info w AGENCY: Home Health agencies  Permission granted to share info w Relationship: daughter and grandson     Emotional Assessment       Orientation: : Oriented to Self, Oriented to Place, Oriented to  Time, Oriented to Situation Alcohol / Substance Use: Not Applicable Psych Involvement: No (comment)  Admission diagnosis:  Diarrhea [R19.7] COVID-19 [U07.1] Patient Active Problem List   Diagnosis Date Noted   COVID-19 01/07/2021   Diarrhea 01/06/2021   COVID-19 virus infection 01/06/2021   Hypoglycemia without diagnosis of diabetes mellitus 01/06/2021   COPD (chronic obstructive pulmonary disease) ([place]) 11/03/2020   Aortic atherosclerosis ([place]) 11/02/2020   Hypertensive kidney disease with CKD (chronic kidney disease) stage V ([place]) 11/02/2020   CKD (chronic kidney disease), stage III ([place]) 11/02/2020   Incontinence 11/02/2020   Conjunctivitis 10/03/2020   Discoloration of skin 07/25/2020   Bad odor of urine 05/11/2020   Bradycardia 03/29/2020   Sebaceous cyst 03/29/2020   Weakness 03/29/2020   UTI (urinary tract infection) 03/19/2020   NSTEMI (non-ST elevated myocardial infarction) ([place]) 03/19/2020   Chronic diastolic CHF (congestive heart failure) ([place])    B12 deficiency 10/13/2019   Skin lesion 08/19/2019   Acute exacerbation of CHF (congestive heart failure) ([place]) 07/22/2019   Acute respiratory failure with hypoxia ([place]) 07/22/2019   Leg swelling 07/22/2019   Atrial fibrillation ([place]) 07/22/2019   Fall at home, initial encounter 07/22/2019   Volume overload 07/19/2019   Hypertensive urgency 12/75/1700   Acute metabolic encephalopathy 17/49/4496   Elevated troponin 05/18/2019   Hypokalemia 05/18/2019   URI (upper respiratory infection) 08/26/2018   Headache 07/17/2017   SOB (shortness of breath) 03/28/2016   Near syncope  03/01/2016   Anemia 10/28/2015   Lower GI bleed    Goals of care, counseling/discussion    Blood loss anemia 06/01/2015   GERD (gastroesophageal reflux disease)    Mild depression ([place])    Unsteady gait 02/08/2015   Schwannoma 10/07/2013   Rectal bleeding 04/03/2013   Light headedness 01/19/2013   Loss of weight 10/22/2012   Hypercholesterolemia 10/22/2012   History of CVA (cerebrovascular accident) 10/22/2012   Anxiety 10/22/2012   Essential hypertension, benign 10/22/2012   PCP:  [name], MD Pharmacy:   [facility], [place] [address] Phone: [phone] Fax: [phone]  [id number] [place], [place] - [address] [place] [address] Phone: [phone] Fax: [phone]     Social Determinants of Health (SDOH) Interventions    Readmission Risk Interventions No flowsheet data found.

## 2021-01-08 NOTE — TOC Transition Note (Signed)
Transition of Care [facility]) - CM/SW Discharge Note   Patient Details  Name: Taylor Watson MRN: [id number] Date of Birth: Sep 08, 1918  Transition of Care [facility] CM/SW Contact:  Shelbie Hutching, RN Phone Number: 01/08/2021, 2:18 PM   Clinical Narrative:    Patient is medically cleared for discharge home with home health services.  Corene Cornea with advanced has accepted referral for home health PT, OT aide and SW.  Patent's grandson will be picking her up today.     Final next level of care: [place] Barriers to Discharge: Barriers Resolved   Patient Goals and CMS Choice Patient states their goals for this hospitalization and ongoing recovery are:: Family thinks that patient would be better off at home with home health services. CMS Medicare.gov Compare Post Acute Care list provided to:: Patient Represenative (must comment) Choice offered to / list presented to : Adult Children  Discharge Placement                       Discharge Plan and Services   Discharge Planning Services: CM Consult Post Acute Care Choice: Home Health          DME Arranged: N/A DME Agency: NA       HH Arranged: PT, OT, Nurse's Aide, Social Work [employer] Agency: [place] ([place]) Date [place]: 01/08/21 Time [place]: 1418 Representative spoke with at [place]: [place] Determinants of Health ([place]) Interventions     Readmission Risk Interventions No flowsheet data found.

## 2021-01-08 NOTE — Discharge Summary (Signed)
Physician Discharge Summary  Taylor Watson GUR:[id number] DOB: Jun 17, 1918 DOA: 01/06/2021  PCP: [name], MD  Admit date: 01/06/2021 Discharge date: 01/08/2021  Admitted From: home  Disposition:  home w/ home health  Recommendations for Outpatient Follow-up:  Follow up with PCP in 1-2 weeks   Home Health: yes Equipment/Devices:  Discharge Condition: stable  CODE STATUS: full  Diet recommendation: Heart Healthy    Brief/Interim Summary: HPI was taken from Dr. Dwyane Dee: Taylor Watson is a 87 y.o. female with medical history significant of heart failure diastolic, CVA, hyperlipidemia, depression, hypertension presents with cough and diarrhea over the last couple days.  Patient had some loose watery diarrhea no blood.  Patient lives with her grandson initially ED provider Dr. Delena Bali spoke with him who reported liquid diarrhea.  Patient had some confusion with this back to baseline.  Patient had some leuks on UA although no urinary complaints was given Rocephin in the ED.  Patient had not eaten today blood sugar was 57 got some D10 as well as a in the ED and blood sugar is now over 100.  Patient is positive for COVID in the ED but mainly maintaining her sats at 95%..  Patient received liter fluid in the ED.  Repeat blood sugar is 134.  Potassium sodium within normal limits creatinine mildly elevated at 1.05.  White count 3.2 H&H 14 and 46 platelets 163.  EKG sinus rhythm.  ED provider discussed with the patient's grandson who reported per ED provider his mother which patient's daughter helps take care of her is also sick and hospital service called for observation. Pt is on entresto-49/51-1/2 tab, kcl 71meq, lasix 40mg -1/2 tab per grandson-Mr. Michael over the phone.  Patient is alert and oriented x2 does complain of diarrhea but not able to provide much details.  I discussed the patient with Mr. [name] her grandson over phone.  Patient is a full code at this time.  Patient's  daughter which is Mr. Norva Riffle mother has COVID which is known contact to for patient.  Patient is not vaccinated against COVID.  Hospital course as per Dr. Jimmye Norman 9/7-01/08/21: Pt was found to have COVID19 and diarrhea. Diarrhea resolved prior to d/c. Pt was not treated for COVID19 as pt's only symptom was diarrhea. PT/OT evaluated the pt and recommended SNF. Pt's family refused but agreed to [facility]. HH was set by CM prior to d/c.   Discharge Diagnoses:  Principal Problem:   Diarrhea Active Problems:   Essential hypertension, benign   COVID-19 virus infection   Hypoglycemia without diagnosis of diabetes mellitus   COVID-19  Diarrhea: potentially gastroenteritis from [place]. Resolved    COVID19 infection: continue on airborne & contract precautions. Maintaining sats therefore we will hold remdesivir and steroids   Hypoglycemia: no hx of DM. Resolved    Generalized weakness:  PT/OT recs SNF. Pt's family refused and agreed to [facility]    HTN: continue on home dose of entresto, lasix    Chronic diastolic CHF: continue on home dose of entresto, lasix   Thrombocytopenia: etiology unclear. Will continue to monitor   Discharge Instructions  Discharge Instructions     Diet - low sodium heart healthy   Complete by: As directed    Discharge instructions   Complete by: As directed    F/u w/ PCP in 1-2 weeks. Needs lab work to check, WBC level within 1-2 weeks   Increase activity slowly   Complete by: As directed       Allergies  as of 01/08/2021       Reactions   Dyazide [hydrochlorothiazide W-triamterene] Other (See Comments)   Unknown reaction   Toprol Xl [metoprolol Tartrate] Other (See Comments)   unknown   Monopril [fosinopril] Cough   Verapamil Other (See Comments)   headache        Medication List     TAKE these medications    aspirin 81 MG EC tablet Take 1 tablet (81 mg total) by mouth daily.   Entresto 49-51 MG Generic drug: sacubitril-valsartan Take 0.5 tablets  by mouth 2 (two) times daily.   erythromycin ophthalmic ointment Use one half inch four times daily to affected eye (s) x 7 days.   furosemide 40 MG tablet Commonly known as: Lasix Take 0.5 tablets (20 mg total) by mouth daily.   hydrocortisone 2.5 % cream Apply topically 2 (two) times daily as needed. Apply to irritated area on rectum bid prn.  Use no more than 7-10 days in same location.   hydrocortisone 25 MG suppository Commonly known as: ANUSOL-HC Place 1 suppository (25 mg total) rectally 2 (two) times daily.   potassium chloride 10 MEQ tablet Commonly known as: KLOR-CON TAKE 1 TABLET BY MOUTH TWICE A DAY FOR 4 DAYS THEN STOP        Allergies  Allergen Reactions   Dyazide [Hydrochlorothiazide W-Triamterene] Other (See Comments)    Unknown reaction   Toprol Xl [Metoprolol Tartrate] Other (See Comments)    unknown   Monopril [Fosinopril] Cough   Verapamil Other (See Comments)    headache    Consultations:    Procedures/Studies: DG Chest Portable 1 View  Result Date: 01/06/2021 CLINICAL DATA:  Shortness of breath. EXAM: PORTABLE CHEST 1 VIEW COMPARISON:  03/19/2020 FINDINGS: The lungs are clear without focal pneumonia, edema, pneumothorax or pleural effusion. Interstitial markings are diffusely coarsened with chronic features. The cardio pericardial silhouette is enlarged. The visualized bony structures of the thorax show no acute abnormality. Telemetry leads overlie the chest. IMPRESSION: Stable.  No acute cardiopulmonary findings. Electronically Signed   By: Misty Stanley M.D.   On: 01/06/2021 13:42   (Echo, Carotid, EGD, Colonoscopy, ERCP)    Subjective: Pt denies any complaints    Discharge Exam: Vitals:   01/08/21 0700 01/08/21 1132  BP: (!) 124/56 (!) 122/48  Pulse: 73 (!) 57  Resp: 20 20  Temp: 97.8 F (36.6 C) 97.8 F (36.6 C)  SpO2: 97% 92%   Vitals:   01/08/21 0436 01/08/21 0700 01/08/21 1044 01/08/21 1132  BP: (!) 136/50 (!) 124/56  (!)  122/48  Pulse: 69 73  (!) 57  Resp: 17 20  20   Temp: 98 F (36.7 C) 97.8 F (36.6 C)  97.8 F (36.6 C)  TempSrc:  Oral    SpO2: 98% 97%  92%  Weight:   49.6 kg   Height:        General: Pt is alert, awake, not in acute distress. Frail appearing  Cardiovascular: S1/S2 +, no rubs, no gallops Respiratory: CTA bilaterally, no wheezing, no rhonchi Abdominal: Soft, NT, ND, bowel sounds + Extremities: no cyanosis    The results of significant diagnostics from this hospitalization (including imaging, microbiology, ancillary and laboratory) are listed below for reference.     Microbiology: Recent Results (from the past 240 hour(s))  Resp Panel by RT-PCR (Flu A&B, Covid) Nasopharyngeal Swab     Status: Abnormal   Collection Time: 01/06/21 12:52 PM   Specimen: Nasopharyngeal Swab; Nasopharyngeal(NP) swabs in vial transport medium  Result Value Ref Range Status   SARS Coronavirus 2 by RT PCR POSITIVE (A) NEGATIVE Final    Comment: RESULT CALLED TO, READ BACK BY AND VERIFIED WITH: KATIE FERGUSON 01/06/21 1352 KLW (NOTE) SARS-CoV-2 target nucleic acids are DETECTED.  The SARS-CoV-2 RNA is generally detectable in upper respiratory specimens during the acute phase of infection. Positive results are indicative of the presence of the identified virus, but do not rule out bacterial infection or co-infection with other pathogens not detected by the test. Clinical correlation with patient history and other diagnostic information is necessary to determine patient infection status. The expected result is Negative.  Fact Sheet for Patients: EntrepreneurPulse.com.au  Fact Sheet for Healthcare Providers: IncredibleEmployment.be  This test is not yet approved or cleared by the [place] FDA and  has been authorized for detection and/or diagnosis of SARS-CoV-2 by FDA under an Emergency Use Authorization (EUA).  This EUA will remain in effect (meaning this  test can be [place] ed) for the duration of  the COVID-19 declaration under Section 564(b)(1) of the Act, 21 U.S.C. section 360bbb-3(b)(1), unless the authorization is terminated or revoked sooner.     Influenza A by PCR NEGATIVE NEGATIVE Final   Influenza B by PCR NEGATIVE NEGATIVE Final    Comment: (NOTE) The Xpert Xpress SARS-CoV-2/FLU/RSV plus assay is intended as an aid in the diagnosis of influenza from Nasopharyngeal swab specimens and should not be used as a sole basis for treatment. Nasal washings and aspirates are unacceptable for Xpert Xpress SARS-CoV-2/FLU/RSV testing.  Fact Sheet for Patients: EntrepreneurPulse.com.au  Fact Sheet for Healthcare Providers: IncredibleEmployment.be  This test is not yet approved or cleared by the [place] FDA and has been authorized for detection and/or diagnosis of SARS-CoV-2 by FDA under an Emergency Use Authorization (EUA). This EUA will remain in effect (meaning this test can be used) for the duration of the COVID-19 declaration under Section 564(b)(1) of the Act, 21 U.S.C. section 360bbb-3(b)(1), unless the authorization is terminated or revoked.  Performed at [facility], [address] [place], [place] [address]   Urine Culture     Status: Abnormal   Collection Time: 01/06/21  2:54 PM   Specimen: Urine, Clean Catch  Result Value Ref Range Status   Specimen Description   Final    URINE, CLEAN CATCH Performed at [facility], [address] [place], [place] [address]    Special Requests   Final    NONE Performed at [facility], [place], [place] [address]    Culture >=100,000 COLONIES/mL ESCHERICHIA COLI (A)  Final   Report Status 01/08/2021 FINAL  Final   Organism ID, Bacteria ESCHERICHIA COLI (A)  Final      Susceptibility   Escherichia coli - MIC*    AMPICILLIN >=32 RESISTANT Resistant     CEFAZOLIN <=4 SENSITIVE Sensitive      CEFEPIME <=0.12 SENSITIVE Sensitive     CEFTRIAXONE <=0.25 SENSITIVE Sensitive     CIPROFLOXACIN <=0.25 SENSITIVE Sensitive     GENTAMICIN <=1 SENSITIVE Sensitive     IMIPENEM <=0.25 SENSITIVE Sensitive     NITROFURANTOIN <=16 SENSITIVE Sensitive     TRIMETH/SULFA <=20 SENSITIVE Sensitive     AMPICILLIN/SULBACTAM 16 INTERMEDIATE Intermediate     PIP/TAZO <=4 SENSITIVE Sensitive     * >=100,000 COLONIES/mL ESCHERICHIA COLI     Labs: BNP (last 3 results) Recent Labs    03/19/20 1455  BNP 500.9*   Basic Metabolic Panel: Recent Labs  Lab 01/06/21 1252 01/07/21 0431 01/08/21 0513  NA 136 138 135  K 4.4 3.8 3.8  CL 101 105 105  CO2 28 28 27   GLUCOSE 144* 95 76  BUN 28* 28* 16  CREATININE 1.05* 0.79 0.68  CALCIUM 8.4* 7.9* 7.8*   Liver Function Tests: Recent Labs  Lab 01/06/21 1252 01/07/21 0431  AST 22 19  ALT 11 11  ALKPHOS 81 63  BILITOT 0.7 0.6  PROT 6.6 6.0*  ALBUMIN 3.3* 2.8*   No results for input(s): LIPASE, AMYLASE in the last 168 hours. No results for input(s): AMMONIA in the last 168 hours. CBC: Recent Labs  Lab 01/06/21 1252 01/07/21 0431 01/08/21 0513  WBC 3.2* 4.4 2.9*  NEUTROABS 1.8 3.1  --   HGB 14.6 12.6 11.7*  HCT 46.2* 39.0 36.6  MCV 90.9 90.1 91.3  PLT 163 145* 144*   Cardiac Enzymes: No results for input(s): CKTOTAL, CKMB, CKMBINDEX, TROPONINI in the last 168 hours. BNP: Invalid input(s): POCBNP CBG: Recent Labs  Lab 01/06/21 1247 01/06/21 1423 01/06/21 1634 01/06/21 1750  GLUCAP 57* 134* 50* 81   D-Dimer No results for input(s): DDIMER in the last 72 hours. Hgb A1c Recent Labs    01/07/21 0431  HGBA1C 5.5   Lipid Profile No results for input(s): CHOL, HDL, LDLCALC, TRIG, CHOLHDL, LDLDIRECT in the last 72 hours. Thyroid function studies No results for input(s): TSH, T4TOTAL, T3FREE, THYROIDAB in the last 72 hours.  Invalid input(s): FREET3 Anemia work up No results for input(s): VITAMINB12, FOLATE, FERRITIN, TIBC,  IRON, RETICCTPCT in the last 72 hours. Urinalysis    Component Value Date/Time   COLORURINE YELLOW 01/06/2021 1454   APPEARANCEUR CLOUDY (A) 01/06/2021 1454   APPEARANCEUR Clear 09/21/2011 0059   LABSPEC 1.020 01/06/2021 1454   LABSPEC 1.005 09/21/2011 0059   PHURINE 6.5 01/06/2021 1454   GLUCOSEU NEGATIVE 01/06/2021 1454   GLUCOSEU Negative 09/21/2011 0059   HGBUR MODERATE (A) 01/06/2021 1454   BILIRUBINUR NEGATIVE 01/06/2021 1454   BILIRUBINUR Negative 09/21/2011 0059   KETONESUR NEGATIVE 01/06/2021 1454   PROTEINUR 100 (A) 01/06/2021 1454   NITRITE NEGATIVE 01/06/2021 1454   LEUKOCYTESUR LARGE (A) 01/06/2021 1454   LEUKOCYTESUR 1+ 09/21/2011 0059   Sepsis Labs Invalid input(s): PROCALCITONIN,  WBC,  LACTICIDVEN Microbiology Recent Results (from the past 240 hour(s))  Resp Panel by RT-PCR (Flu A&B, Covid) Nasopharyngeal Swab     Status: Abnormal   Collection Time: 01/06/21 12:52 PM   Specimen: Nasopharyngeal Swab; Nasopharyngeal(NP) swabs in vial transport medium  Result Value Ref Range Status   SARS Coronavirus 2 by RT PCR POSITIVE (A) NEGATIVE Final    Comment: RESULT CALLED TO, READ BACK BY AND VERIFIED WITH: KATIE FERGUSON 01/06/21 1352 KLW (NOTE) SARS-CoV-2 target nucleic acids are DETECTED.  The SARS-CoV-2 RNA is generally detectable in upper respiratory specimens during the acute phase of infection. Positive results are indicative of the presence of the identified virus, but do not rule out bacterial infection or co-infection with other pathogens not detected by the test. Clinical correlation with patient history and other diagnostic information is necessary to determine patient infection status. The expected result is Negative.  Fact Sheet for Patients: EntrepreneurPulse.com.au  Fact Sheet for Healthcare Providers: IncredibleEmployment.be  This test is not yet approved or cleared by the [place] FDA and  has been  authorized for detection and/or diagnosis of SARS-CoV-2 by FDA under an Emergency Use Authorization (EUA).  This EUA will remain in effect (meaning this test can  be [place] ed) for the duration of  the COVID-19 declaration under Section 564(b)(1) of the Act, 21 U.S.C. section 360bbb-3(b)(1), unless the authorization is terminated or revoked sooner.     Influenza A by PCR NEGATIVE NEGATIVE Final   Influenza B by PCR NEGATIVE NEGATIVE Final    Comment: (NOTE) The Xpert Xpress SARS-CoV-2/FLU/RSV plus assay is intended as an aid in the diagnosis of influenza from Nasopharyngeal swab specimens and should not be used as a sole basis for treatment. Nasal washings and aspirates are unacceptable for Xpert Xpress SARS-CoV-2/FLU/RSV testing.  Fact Sheet for Patients: EntrepreneurPulse.com.au  Fact Sheet for Healthcare Providers: IncredibleEmployment.be  This test is not yet approved or cleared by the [place] FDA and has been authorized for detection and/or diagnosis of SARS-CoV-2 by FDA under an Emergency Use Authorization (EUA). This EUA will remain in effect (meaning this test can be used) for the duration of the COVID-19 declaration under Section 564(b)(1) of the Act, 21 U.S.C. section 360bbb-3(b)(1), unless the authorization is terminated or revoked.  Performed at [facility], [address] [place], [place] [address]   Urine Culture     Status: Abnormal   Collection Time: 01/06/21  2:54 PM   Specimen: Urine, Clean Catch  Result Value Ref Range Status   Specimen Description   Final    URINE, CLEAN CATCH Performed at [facility], [address], [place], [place] [address]    Special Requests   Final    NONE Performed at [facility], [place],  [address]    Culture >=100,000 COLONIES/mL ESCHERICHIA COLI (A)  Final   Report Status 01/08/2021 FINAL  Final   Organism ID, Bacteria  ESCHERICHIA COLI (A)  Final      Susceptibility   Escherichia coli - MIC*    AMPICILLIN >=32 RESISTANT Resistant     CEFAZOLIN <=4 SENSITIVE Sensitive     CEFEPIME <=0.12 SENSITIVE Sensitive     CEFTRIAXONE <=0.25 SENSITIVE Sensitive     CIPROFLOXACIN <=0.25 SENSITIVE Sensitive     GENTAMICIN <=1 SENSITIVE Sensitive     IMIPENEM <=0.25 SENSITIVE Sensitive     NITROFURANTOIN <=16 SENSITIVE Sensitive     TRIMETH/SULFA <=20 SENSITIVE Sensitive     AMPICILLIN/SULBACTAM 16 INTERMEDIATE Intermediate     PIP/TAZO <=4 SENSITIVE Sensitive     * >=100,000 COLONIES/mL ESCHERICHIA COLI     Time coordinating discharge: Over 30 minutes  SIGNED:   Wyvonnia Dusky, MD  [facility] 01/08/2021, 1:37 PM Pager   If 7PM-7AM, please contact night-coverage

## 2021-01-12 ENCOUNTER — Telehealth: Payer: Self-pay

## 2021-01-12 ENCOUNTER — Telehealth: Payer: Self-pay | Admitting: Internal Medicine

## 2021-01-12 NOTE — Telephone Encounter (Signed)
Patients daughter is calling in to set up a follow up appointment after having COVID and would like to know if the patient can have homehealth 3-4 days during the week because she is having health problems and cannot help her.Please advise.

## 2021-01-12 NOTE — Telephone Encounter (Signed)
Transition Care Management Unsuccessful Follow-up Telephone Call  Date of discharge and from where:  01/08/21 from [facility]  Attempts:  1st Attempt  Reason for unsuccessful TCM follow-up call:  No answer/busy

## 2021-01-13 ENCOUNTER — Inpatient Hospital Stay
Admission: EM | Admit: 2021-01-13 | Discharge: 2021-01-18 | DRG: 177 | Disposition: A | Discharge status: Home Health | Payer: Medicare Other | Attending: Internal Medicine | Admitting: Internal Medicine

## 2021-01-13 ENCOUNTER — Other Ambulatory Visit: Payer: Self-pay

## 2021-01-13 ENCOUNTER — Telehealth: Payer: Self-pay | Admitting: Internal Medicine

## 2021-01-13 ENCOUNTER — Emergency Department: Payer: Medicare Other

## 2021-01-13 DIAGNOSIS — N390 Urinary tract infection, site not specified: Secondary | ICD-10-CM | POA: Diagnosis not present

## 2021-01-13 DIAGNOSIS — I9589 Other hypotension: Secondary | ICD-10-CM

## 2021-01-13 DIAGNOSIS — K219 Gastro-esophageal reflux disease without esophagitis: Secondary | ICD-10-CM | POA: Diagnosis present

## 2021-01-13 DIAGNOSIS — J90 Pleural effusion, not elsewhere classified: Secondary | ICD-10-CM | POA: Diagnosis not present

## 2021-01-13 DIAGNOSIS — Z8673 Personal history of transient ischemic attack (TIA), and cerebral infarction without residual deficits: Secondary | ICD-10-CM

## 2021-01-13 DIAGNOSIS — E861 Hypovolemia: Secondary | ICD-10-CM

## 2021-01-13 DIAGNOSIS — E7800 Pure hypercholesterolemia, unspecified: Secondary | ICD-10-CM | POA: Diagnosis present

## 2021-01-13 DIAGNOSIS — I130 Hypertensive heart and chronic kidney disease with heart failure and stage 1 through stage 4 chronic kidney disease, or unspecified chronic kidney disease: Secondary | ICD-10-CM | POA: Diagnosis present

## 2021-01-13 DIAGNOSIS — L8990 Pressure ulcer of unspecified site, unspecified stage: Secondary | ICD-10-CM | POA: Insufficient documentation

## 2021-01-13 DIAGNOSIS — E538 Deficiency of other specified B group vitamins: Secondary | ICD-10-CM | POA: Diagnosis not present

## 2021-01-13 DIAGNOSIS — Z79899 Other long term (current) drug therapy: Secondary | ICD-10-CM

## 2021-01-13 DIAGNOSIS — Z800 Family history of malignant neoplasm of digestive organs: Secondary | ICD-10-CM

## 2021-01-13 DIAGNOSIS — R531 Weakness: Secondary | ICD-10-CM | POA: Diagnosis not present

## 2021-01-13 DIAGNOSIS — B9620 Unspecified Escherichia coli [E. coli] as the cause of diseases classified elsewhere: Secondary | ICD-10-CM | POA: Diagnosis present

## 2021-01-13 DIAGNOSIS — I5032 Chronic diastolic (congestive) heart failure: Secondary | ICD-10-CM | POA: Diagnosis present

## 2021-01-13 DIAGNOSIS — I4891 Unspecified atrial fibrillation: Secondary | ICD-10-CM | POA: Diagnosis not present

## 2021-01-13 DIAGNOSIS — Z66 Do not resuscitate: Secondary | ICD-10-CM | POA: Diagnosis not present

## 2021-01-13 DIAGNOSIS — N189 Chronic kidney disease, unspecified: Secondary | ICD-10-CM

## 2021-01-13 DIAGNOSIS — N179 Acute kidney failure, unspecified: Secondary | ICD-10-CM | POA: Diagnosis not present

## 2021-01-13 DIAGNOSIS — E860 Dehydration: Principal | ICD-10-CM

## 2021-01-13 DIAGNOSIS — L89222 Pressure ulcer of left hip, stage 2: Secondary | ICD-10-CM | POA: Diagnosis present

## 2021-01-13 DIAGNOSIS — N182 Chronic kidney disease, stage 2 (mild): Secondary | ICD-10-CM | POA: Diagnosis present

## 2021-01-13 DIAGNOSIS — U071 COVID-19: Secondary | ICD-10-CM | POA: Diagnosis not present

## 2021-01-13 DIAGNOSIS — I499 Cardiac arrhythmia, unspecified: Secondary | ICD-10-CM | POA: Diagnosis not present

## 2021-01-13 DIAGNOSIS — R6889 Other general symptoms and signs: Secondary | ICD-10-CM | POA: Diagnosis not present

## 2021-01-13 DIAGNOSIS — A09 Infectious gastroenteritis and colitis, unspecified: Secondary | ICD-10-CM | POA: Diagnosis present

## 2021-01-13 DIAGNOSIS — Z9049 Acquired absence of other specified parts of digestive tract: Secondary | ICD-10-CM | POA: Diagnosis not present

## 2021-01-13 DIAGNOSIS — E162 Hypoglycemia, unspecified: Secondary | ICD-10-CM | POA: Diagnosis present

## 2021-01-13 DIAGNOSIS — Z743 Need for continuous supervision: Secondary | ICD-10-CM | POA: Diagnosis not present

## 2021-01-13 DIAGNOSIS — Z681 Body mass index (BMI) 19 or less, adult: Secondary | ICD-10-CM

## 2021-01-13 DIAGNOSIS — J440 Chronic obstructive pulmonary disease with acute lower respiratory infection: Secondary | ICD-10-CM | POA: Diagnosis not present

## 2021-01-13 DIAGNOSIS — I517 Cardiomegaly: Secondary | ICD-10-CM | POA: Diagnosis not present

## 2021-01-13 DIAGNOSIS — J1282 Pneumonia due to coronavirus disease 2019: Secondary | ICD-10-CM | POA: Diagnosis not present

## 2021-01-13 DIAGNOSIS — I959 Hypotension, unspecified: Secondary | ICD-10-CM | POA: Diagnosis present

## 2021-01-13 DIAGNOSIS — Z8249 Family history of ischemic heart disease and other diseases of the circulatory system: Secondary | ICD-10-CM | POA: Diagnosis not present

## 2021-01-13 DIAGNOSIS — Z87891 Personal history of nicotine dependence: Secondary | ICD-10-CM

## 2021-01-13 DIAGNOSIS — Z87440 Personal history of urinary (tract) infections: Secondary | ICD-10-CM | POA: Diagnosis not present

## 2021-01-13 DIAGNOSIS — G9349 Other encephalopathy: Secondary | ICD-10-CM

## 2021-01-13 DIAGNOSIS — E43 Unspecified severe protein-calorie malnutrition: Secondary | ICD-10-CM | POA: Diagnosis present

## 2021-01-13 DIAGNOSIS — Z7982 Long term (current) use of aspirin: Secondary | ICD-10-CM

## 2021-01-13 DIAGNOSIS — R0602 Shortness of breath: Secondary | ICD-10-CM | POA: Diagnosis not present

## 2021-01-13 DIAGNOSIS — I493 Ventricular premature depolarization: Secondary | ICD-10-CM | POA: Diagnosis present

## 2021-01-13 LAB — COMPREHENSIVE METABOLIC PANEL
ALT: 13 U/L (ref 0–44)
AST: 34 U/L (ref 15–41)
Albumin: 2.8 g/dL — ABNORMAL LOW (ref 3.5–5.0)
Alkaline Phosphatase: 63 U/L (ref 38–126)
Anion gap: 8 (ref 5–15)
BUN: 19 mg/dL (ref 8–23)
CO2: 28 mmol/L (ref 22–32)
Calcium: 8.2 mg/dL — ABNORMAL LOW (ref 8.9–10.3)
Chloride: 104 mmol/L (ref 98–111)
Creatinine, Ser: 1.04 mg/dL — ABNORMAL HIGH (ref 0.44–1.00)
GFR, Estimated: 48 mL/min — ABNORMAL LOW (ref >60)
Glucose, Bld: 97 mg/dL (ref 70–99)
Potassium: 3.8 mmol/L (ref 3.5–5.1)
Sodium: 140 mmol/L (ref 135–145)
Total Bilirubin: 0.8 mg/dL (ref 0.3–1.2)
Total Protein: 6.1 g/dL — ABNORMAL LOW (ref 6.5–8.1)

## 2021-01-13 LAB — CBC WITH DIFFERENTIAL/PLATELET
Abs Immature Granulocytes: 0.02 10*3/uL (ref 0.00–0.07)
Basophils Absolute: 0 10*3/uL (ref 0.0–0.1)
Basophils Relative: 0 %
Eosinophils Absolute: 0 10*3/uL (ref 0.0–0.5)
Eosinophils Relative: 0 %
HCT: 41.7 % (ref 36.0–46.0)
Hemoglobin: 13.5 g/dL (ref 12.0–15.0)
Immature Granulocytes: 1 %
Lymphocytes Relative: 17 %
Lymphs Abs: 0.5 10*3/uL — ABNORMAL LOW (ref 0.7–4.0)
MCH: 29.2 pg (ref 26.0–34.0)
MCHC: 32.4 g/dL (ref 30.0–36.0)
MCV: 90.3 fL (ref 80.0–100.0)
Monocytes Absolute: 0.3 10*3/uL (ref 0.1–1.0)
Monocytes Relative: 10 %
Neutro Abs: 2.2 10*3/uL (ref 1.7–7.7)
Neutrophils Relative %: 72 %
Platelets: 179 10*3/uL (ref 150–400)
RBC: 4.62 MIL/uL (ref 3.87–5.11)
RDW: 14.7 % (ref 11.5–15.5)
WBC: 3.3 10*3/uL — ABNORMAL LOW (ref 4.0–10.5)
nRBC: 0 % (ref 0.0–0.2)

## 2021-01-13 LAB — LACTIC ACID, PLASMA: Lactic Acid, Venous: 1.6 mmol/L (ref 0.5–1.9)

## 2021-01-13 MED ORDER — SODIUM CHLORIDE 0.9 % IV SOLN: INTRAVENOUS | Status: DC

## 2021-01-13 MED ORDER — SODIUM CHLORIDE 0.9 % IV SOLN
200.0000 mg | Freq: Once | INTRAVENOUS | Status: AC
  Administered 2021-01-13: 200 mg via INTRAVENOUS
  Filled 2021-01-13: qty 200

## 2021-01-13 MED ORDER — SODIUM CHLORIDE 0.9 % IV SOLN
100.0000 mg | Freq: Every day | INTRAVENOUS | Status: AC
  Administered 2021-01-14 – 2021-01-17 (×4): 100 mg via INTRAVENOUS
  Filled 2021-01-13 (×2): qty 100
  Filled 2021-01-13: qty 20
  Filled 2021-01-13: qty 100
  Filled 2021-01-13: qty 20

## 2021-01-13 MED ORDER — LOPERAMIDE HCL 2 MG PO CAPS: 2.0000 mg | ORAL_CAPSULE | ORAL | Status: DC | PRN

## 2021-01-13 MED ORDER — CEFTRIAXONE SODIUM 1 G IJ SOLR
1.0000 g | INTRAMUSCULAR | Status: DC
  Administered 2021-01-14 – 2021-01-18 (×5): 1 g via INTRAVENOUS
  Filled 2021-01-13 (×4): qty 1
  Filled 2021-01-13 (×2): qty 10

## 2021-01-13 MED ORDER — ONDANSETRON HCL 4 MG/2ML IJ SOLN: 4.0000 mg | Freq: Four times a day (QID) | INTRAMUSCULAR | Status: DC | PRN

## 2021-01-13 MED ORDER — ENOXAPARIN SODIUM 30 MG/0.3ML IJ SOSY
30.0000 mg | PREFILLED_SYRINGE | INTRAMUSCULAR | Status: DC
  Administered 2021-01-13 – 2021-01-17 (×5): 30 mg via SUBCUTANEOUS
  Filled 2021-01-13 (×5): qty 0.3

## 2021-01-13 NOTE — Telephone Encounter (Signed)
Transition Care Management Unsuccessful Follow-up Telephone Call  Date of discharge and from where:  01/08/21 from [facility]  Attempts:  2nd Attempt  Reason for unsuccessful TCM follow-up call:  Unable to leave message. HFU scheduled 01/14/21 at 1130.

## 2021-01-13 NOTE — Telephone Encounter (Signed)
Patient was scheduled with a Np for tomorrow virtually. Unable to see Patient due to her possible needing home health orders and NP can not sign these.   Patient recently in the hospital and positive for Covid. Needing to be evaluated and possibly have chest x-ray done. Patient needing to be worked in Please advise

## 2021-01-13 NOTE — Telephone Encounter (Signed)
FYI   Discussed with Lorriane Shire. Patient is being scheduled for virtual tomorrow. Cannot come into office due to covid. Will discuss HH at this time.

## 2021-01-13 NOTE — Progress Notes (Signed)
Remdesivir - Pharmacy Brief Note   O:  ALT: 13 CXR: Increasing bibasilar atelectasis or infiltrates. SpO2: 98% on 2 L [place] at baseline   A/P:  Remdesivir 200 mg IVPB once followed by 100 mg IVPB daily x 4 days.   Tawnya Crook, PharmD, BCPS Clinical Pharmacist 01/13/2021 6:12 PM

## 2021-01-13 NOTE — ED Notes (Signed)
Family at bedside. 

## 2021-01-13 NOTE — H&P (Signed)
History and Physical    MANVIR THORSON UTM:[id number] DOB: 07-Sep-1918 DOA: 01/13/2021  PCP: [name], MD  Patient coming from: Home  I have personally briefly reviewed patient's old medical records in [place]  Chief Complaint: decrease PO intake  HPI: Taylor Watson is a 87 y.o. female with medical history significant for chronic diastolic heart failure, atrial fibrillation, history of GI bleed, COPD with chronic hypoxemia on 2 L, hypertension, history of CVA, anxiety/depression and recent COVID-19 infection who presents with concerns of chest congestion, recurrent diarrhea and weakness.  Grandson at bedside provides history as patient is not able to provide much history and at times appears confused. Patient was recently hospitalized from 9/6-9/8 for diarrhea secondary to COVID-19 infection but not treated with remdesivir and steroids due to lack of respiratory symptoms. Also noted to be hypoglycemia with improvement after fluids. PT/OT recommended SNF but family refused and agreed to home health.   Patient had improvement after discharge but in the last few days had recurrent of her diarrhea.  She continues to have congestion with cough which grandson said was present even during the last admission.  Prior to the last admission, she was using her oxygen as needed but has since been using it daily during exertion.  She also did not want to eat yesterday due to loss of taste and lack of appetite.  Normally ambulates with walker but has not been able to do this due to increasing weakness.  She denies any chest pain or shortness of breath.  No nausea or vomiting.  Denies abdominal pain.  States she has burning sensation with urination.  Upon EMS arrival, she was found to be hypotensive with systolic in the 56C with improvement after 500 cc bolus.  WBC of 3.3, hemoglobin 13.5, sodium and potassium normal.  Creatinine elevated to 1.04 from prior of 0.68. Chest x-ray  showing increased bibasilar atelectasis versus infiltrate. EKG with frequent PVCs and nonspecific T wave inversion of V5 and V6.  Patient was started on remdesivir in the ED and hospitalist called for admission.  Review of Systems: Pertinent positives and negatives as stated above.  Patient otherwise not able to provide much history and is intermittently confused at times.  Past Medical History:  Diagnosis Date   Allergy    Anemia    Arrhythmia    CHF (congestive heart failure) (HCC)    CVA (cerebral vascular accident) ([place])    Depression    GERD (gastroesophageal reflux disease)    History of blood transfusion    History of kidney stones    Hx: UTI (urinary tract infection)    Hypercholesterolemia    Hypertension     Past Surgical History:  Procedure Laterality Date   CHOLECYSTECTOMY  1984   NECK LESION BIOPSY  2015   Followed by Dr. Richardson Landry     reports that she has quit smoking. She has quit using smokeless tobacco.  Her smokeless tobacco use included chew. She reports that she does not drink alcohol and does not use drugs. Social History  Allergies  Allergen Reactions   Dyazide [Hydrochlorothiazide W-Triamterene] Other (See Comments)    Unknown reaction   Toprol Xl [Metoprolol Tartrate] Other (See Comments)    unknown   Monopril [Fosinopril] Cough   Verapamil Other (See Comments)    headache    Family History  Problem Relation Age of Onset   Cancer Mother        unknown type   Hypertension Mother  Cancer Father        unknown type   Cancer Daughter    Colon cancer Other        grandfather     Prior to Admission medications   Medication Sig Start Date End Date Taking? Authorizing Provider  aspirin EC 81 MG EC tablet Take 1 tablet (81 mg total) by mouth daily. Patient not taking: Reported on 12/09/2020 05/20/19   Lavina Hamman, MD  erythromycin ophthalmic ointment Use one half inch four times daily to affected eye (s) x 7 days. Patient not taking:  Reported on 12/09/2020 10/03/20   Burnard Hawthorne, FNP  furosemide (LASIX) 40 MG tablet Take 0.5 tablets (20 mg total) by mouth daily. 09/13/19   [name], MD  hydrocortisone (ANUSOL-HC) 25 MG suppository Place 1 suppository (25 mg total) rectally 2 (two) times daily. 10/31/20   [name], MD  hydrocortisone 2.5 % cream Apply topically 2 (two) times daily as needed. Apply to irritated area on rectum bid prn.  Use no more than 7-10 days in same location. 11/14/20   [name], MD  potassium chloride (KLOR-CON) 10 MEQ tablet TAKE 1 TABLET BY MOUTH TWICE A DAY FOR 4 DAYS THEN STOP 01/06/21   [name], MD  sacubitril-valsartan (ENTRESTO) 49-51 MG Take 0.5 tablets by mouth 2 (two) times daily. 09/29/20   Alisa Graff, FNP    Physical Exam: Vitals:   01/13/21 1716 01/13/21 1727 01/13/21 1730 01/13/21 1815  BP:      Pulse:   72   Resp:   18 17  Temp:  98.2 F (36.8 C)    TempSrc:  Oral    SpO2:   98%   Weight: 49.6 kg     Height: 5\' 6"  (1.676 m)       Constitutional: NAD, calm, comfortable, nontoxic thin frail elderly female sitting upright in bed Vitals:   01/13/21 1716 01/13/21 1727 01/13/21 1730 01/13/21 1815  BP:      Pulse:   72   Resp:   18 17  Temp:  98.2 F (36.8 C)    TempSrc:  Oral    SpO2:   98%   Weight: 49.6 kg     Height: 5\' 6"  (1.676 m)      Eyes: PERRL, lids and conjunctivae normal ENMT: Mucous membranes are moist.  Neck: normal, supple Respiratory: Diminished breath sounds throughout, no wheezing, no crackles. Normal respiratory effort on 2 L via nasal cannula.  Occasional productive cough.  No accessory muscle use.  Cardiovascular: Regular rate and rhythm, no murmurs / rubs / gallops. No extremity edema.  Abdomen: no tenderness, no masses palpated.  Bowel sounds positive.  Musculoskeletal: no clubbing / cyanosis. No joint deformity upper and lower extremities. Good ROM, no contractures. Normal muscle tone.  Skin: no rashes, lesions, ulcers. No  induration Neurologic: CN 2-12 grossly intact. Sensation intact. Strength 4/5 in all 4.  Psychiatric: Alert and oriented x 3. Normal mood.  Able to answer most questions appropriately but appeared confused towards the end of evaluation and not able to answer questions.    Labs on Admission: I have personally reviewed following labs and imaging studies  CBC: Recent Labs  Lab 01/07/21 0431 01/08/21 0513 01/13/21 1705  WBC 4.4 2.9* 3.3*  NEUTROABS 3.1  --  2.2  HGB 12.6 11.7* 13.5  HCT 39.0 36.6 41.7  MCV 90.1 91.3 90.3  PLT 145* 144* 656   Basic Metabolic Panel: Recent Labs  Lab 01/07/21  0109 01/08/21 0513 01/13/21 1705  NA 138 135 140  K 3.8 3.8 3.8  CL 105 105 104  CO2 28 27 28   GLUCOSE 95 76 97  BUN 28* 16 19  CREATININE 0.79 0.68 1.04*  CALCIUM 7.9* 7.8* 8.2*   GFR: Estimated Creatinine Clearance: 22.5 mL/min (A) (by C-G formula based on SCr of 1.04 mg/dL (H)). Liver Function Tests: Recent Labs  Lab 01/07/21 0431 01/13/21 1705  AST 19 34  ALT 11 13  ALKPHOS 63 63  BILITOT 0.6 0.8  PROT 6.0* 6.1*  ALBUMIN 2.8* 2.8*   No results for input(s): LIPASE, AMYLASE in the last 168 hours. No results for input(s): AMMONIA in the last 168 hours. Coagulation Profile: No results for input(s): INR, PROTIME in the last 168 hours. Cardiac Enzymes: No results for input(s): CKTOTAL, CKMB, CKMBINDEX, TROPONINI in the last 168 hours. BNP (last 3 results) No results for input(s): PROBNP in the last 8760 hours. HbA1C: No results for input(s): HGBA1C in the last 72 hours. CBG: No results for input(s): GLUCAP in the last 168 hours. Lipid Profile: No results for input(s): CHOL, HDL, LDLCALC, TRIG, CHOLHDL, LDLDIRECT in the last 72 hours. Thyroid Function Tests: No results for input(s): TSH, T4TOTAL, FREET4, T3FREE, THYROIDAB in the last 72 hours. Anemia Panel: No results for input(s): VITAMINB12, FOLATE, FERRITIN, TIBC, IRON, RETICCTPCT in the last 72 hours. Urine  analysis:    Component Value Date/Time   COLORURINE YELLOW 01/06/2021 1454   APPEARANCEUR CLOUDY (A) 01/06/2021 1454   APPEARANCEUR Clear 09/21/2011 0059   LABSPEC 1.020 01/06/2021 1454   LABSPEC 1.005 09/21/2011 0059   PHURINE 6.5 01/06/2021 1454   GLUCOSEU NEGATIVE 01/06/2021 1454   GLUCOSEU Negative 09/21/2011 0059   HGBUR MODERATE (A) 01/06/2021 1454   BILIRUBINUR NEGATIVE 01/06/2021 1454   BILIRUBINUR Negative 09/21/2011 0059   KETONESUR NEGATIVE 01/06/2021 1454   PROTEINUR 100 (A) 01/06/2021 1454   NITRITE NEGATIVE 01/06/2021 1454   LEUKOCYTESUR LARGE (A) 01/06/2021 1454   LEUKOCYTESUR 1+ 09/21/2011 0059    Radiological Exams on Admission: DG Chest Portable 1 View  Result Date: 01/13/2021 CLINICAL DATA:  Weakness, COVID EXAM: PORTABLE CHEST 1 VIEW COMPARISON:  01/06/2021 FINDINGS: Bilateral lower lobe airspace opacities appear increased since prior study. Mild cardiomegaly. Diffuse interstitial prominence is stable. Small left effusion. No pneumothorax. Aortic atherosclerosis. IMPRESSION: Increasing bibasilar atelectasis or infiltrates. Small left effusion. Stable chronic interstitial disease. Aortic atherosclerosis. Electronically Signed   By: Rolm Baptise M.D.   On: 01/13/2021 17:23      Assessment/Plan COVID-19 infection -Continue IV remdesivir since patient was noted on chest x-ray to have worsening bilateral infiltrate.  She also has COPD with as needed O2 but recently had increased persistent use. -PRN Imodium for diarrhea -PRN antiemetic -Continue airborne and contact precaution  AKI - Creatinine elevated 1.04 - Has received family cc with EMS.  Continuous IV 50 cc/h fluid for 10 hours - Follow repeat creatinine in the morning.  Avoid nephrotoxic agent.  UTI -UA is pending but she reports dysuria and was noted in recent admission to have E.coli in culture on 9/6. She was only treated with one time dose of IV Rocephin -start IV Rocephin and follow  UA  Hypotension -improved with fluids -hold home Entresto and Lasix overnight   Chronic diastolic CHF -Hold home meds as above due to hypotension    Level of care: Med-Surg  DVT prophylaxis:.Lovenox Code Status: Full Family Communication: Plan discussed with patient at bedside  disposition Plan: Home  with observation Consults called:  Admission status: Observation  Status is: Observation  The patient remains OBS appropriate and will d/c before 2 midnights.  Dispo: The patient is from: Home              Anticipated d/c is to: Home              Patient currently is not medically stable to d/c.   Difficult to place patient No         Orene Desanctis DO [facility]   If 7PM-7AM, please contact night-coverage [email or web address]   01/13/2021, 6:32 PM

## 2021-01-13 NOTE — ED Triage Notes (Signed)
Pt to ED via [facility] from home. Per EMS pt recently dx with COVID and seen for dehydration. Pt has had increased weakness since. Pt states she wears 2L [place] at home. Pt initially hypotensive 89/55 and given IVF in route. EMS states pt in uncontrolled afib with HR between 40-130s.   Pt normotensive upon arrival. Pt endorses SOB. Denies CP.

## 2021-01-13 NOTE — ED Provider Notes (Signed)
[facility] Emergency Department Provider Note    Event Date/Time   First MD Initiated Contact with Patient 01/13/21 1655     (approximate)  I have reviewed the triage vital signs and the nursing notes.   HISTORY  Chief Complaint Weakness (Afib and COVID +)    HPI Taylor Watson is a 87 y.o. female extensive past medical history as listed below with recent admission to hospital for diarrheal illness and COVID Rie presents to the ER for similar symptoms.  Was not treated with remdesivir or antiviral she was not complaining of shortness of breath or cough.  She does complain of generalized malaise and weakness and still having nonbloody liquid stools.  EMS found patient she was mildly confused lightheaded and hypotensive with systolic in the 24Q.  She was given 100 cc of IV fluid.  She denies any pain right now.  Past Medical History:  Diagnosis Date   Allergy    Anemia    Arrhythmia    CHF (congestive heart failure) (HCC)    CVA (cerebral vascular accident) ([place])    Depression    GERD (gastroesophageal reflux disease)    History of blood transfusion    History of kidney stones    Hx: UTI (urinary tract infection)    Hypercholesterolemia    Hypertension    Family History  Problem Relation Age of Onset   Cancer Mother        unknown type   Hypertension Mother    Cancer Father        unknown type   Cancer Daughter    Colon cancer Other        grandfather   Past Surgical History:  Procedure Laterality Date   CHOLECYSTECTOMY  1984   NECK LESION BIOPSY  2015   Followed by Dr. Richardson Landry   Patient Active Problem List   Diagnosis Date Noted   Protein-calorie malnutrition, severe 01/08/2021   COVID-19 01/07/2021   Diarrhea 01/06/2021   COVID-19 virus infection 01/06/2021   Hypoglycemia without diagnosis of diabetes mellitus 01/06/2021   COPD (chronic obstructive pulmonary disease) ([place]) 11/03/2020   Aortic atherosclerosis ([place]) 11/02/2020    Hypertensive kidney disease with CKD (chronic kidney disease) stage V ([place]) 11/02/2020   CKD (chronic kidney disease), stage III ([place]) 11/02/2020   Incontinence 11/02/2020   Conjunctivitis 10/03/2020   Discoloration of skin 07/25/2020   Bad odor of urine 05/11/2020   Bradycardia 03/29/2020   Sebaceous cyst 03/29/2020   Weakness 03/29/2020   UTI (urinary tract infection) 03/19/2020   NSTEMI (non-ST elevated myocardial infarction) ([place]) 03/19/2020   Chronic diastolic CHF (congestive heart failure) ([place])    B12 deficiency 10/13/2019   Skin lesion 08/19/2019   Acute exacerbation of CHF (congestive heart failure) ([place]) 07/22/2019   Acute respiratory failure with hypoxia ([place]) 07/22/2019   Leg swelling 07/22/2019   Atrial fibrillation ([place]) 07/22/2019   Fall at home, initial encounter 07/22/2019   Volume overload 07/19/2019   Hypertensive urgency 68/34/1962   Acute metabolic encephalopathy 22/97/9892   Elevated troponin 05/18/2019   Hypokalemia 05/18/2019   URI (upper respiratory infection) 08/26/2018   Headache 07/17/2017   SOB (shortness of breath) 03/28/2016   Near syncope 03/01/2016   Anemia 10/28/2015   Lower GI bleed    Goals of care, counseling/discussion    Blood loss anemia 06/01/2015   GERD (gastroesophageal reflux disease)    Mild depression ([place])    Unsteady gait 02/08/2015   Schwannoma 10/07/2013  Rectal bleeding 04/03/2013   Light headedness 01/19/2013   Loss of weight 10/22/2012   Hypercholesterolemia 10/22/2012   History of CVA (cerebrovascular accident) 10/22/2012   Anxiety 10/22/2012   Essential hypertension, benign 10/22/2012      Prior to Admission medications   Medication Sig Start Date End Date Taking? Authorizing Provider  aspirin EC 81 MG EC tablet Take 1 tablet (81 mg total) by mouth daily. Patient not taking: Reported on 12/09/2020 05/20/19   Lavina Hamman, MD  erythromycin ophthalmic ointment Use one half inch four times daily to affected eye  (s) x 7 days. Patient not taking: Reported on 12/09/2020 10/03/20   Burnard Hawthorne, FNP  furosemide (LASIX) 40 MG tablet Take 0.5 tablets (20 mg total) by mouth daily. 09/13/19   [name], MD  hydrocortisone (ANUSOL-HC) 25 MG suppository Place 1 suppository (25 mg total) rectally 2 (two) times daily. 10/31/20   [name], MD  hydrocortisone 2.5 % cream Apply topically 2 (two) times daily as needed. Apply to irritated area on rectum bid prn.  Use no more than 7-10 days in same location. 11/14/20   [name], MD  potassium chloride (KLOR-CON) 10 MEQ tablet TAKE 1 TABLET BY MOUTH TWICE A DAY FOR 4 DAYS THEN STOP 01/06/21   [name], MD  sacubitril-valsartan (ENTRESTO) 49-51 MG Take 0.5 tablets by mouth 2 (two) times daily. 09/29/20   Alisa Graff, FNP    Allergies Dyazide [hydrochlorothiazide w-triamterene], Toprol xl [metoprolol tartrate], Monopril [fosinopril], and Verapamil    Social History Social History   Tobacco Use   Smoking status: Former   Smokeless tobacco: Former    Types: Chew  Substance Use Topics   Alcohol use: No    Alcohol/week: 0.0 standard drinks   Drug use: No    Review of Systems Patient denies headaches, rhinorrhea, blurry vision, numbness, shortness of breath, chest pain, edema, cough, abdominal pain, nausea, vomiting, diarrhea, dysuria, fevers, rashes or hallucinations unless otherwise stated above in HPI. ____________________________________________   PHYSICAL EXAM:  VITAL SIGNS: Vitals:   01/13/21 1727 01/13/21 1730  BP:    Pulse:  72  Resp:  18  Temp: 98.2 F (36.8 C)   SpO2:  98%    Constitutional: Alert, frail and ill appearing  Eyes: Conjunctivae are normal.  Head: Atraumatic. Nose: No congestion/rhinnorhea. Mouth/Throat: Mucous membranes are moist.   Neck: No stridor. Painless ROM.  Cardiovascular: normal rate, irregular rhythm. Grossly normal heart sounds.  Good peripheral circulation. Respiratory: Normal  respiratory effort.  No retractions. Lungs CTAB. Gastrointestinal: Soft and nontender. No distention. No abdominal bruits. No CVA tenderness. Genitourinary:  Musculoskeletal: No lower extremity tenderness nor edema.  No joint effusions. Neurologic:  Normal speech and language. No gross focal neurologic deficits are appreciated. No facial droop Skin:  Skin is warm, dry and intact. No rash noted. Psychiatric: Mood and affect are normal. Speech and behavior are normal.  ____________________________________________   LABS (all labs ordered are listed, but only abnormal results are displayed)  Results for orders placed or performed during the hospital encounter of 01/13/21 (from the past 24 hour(s))  CBC with Differential     Status: Abnormal   Collection Time: 01/13/21  5:05 PM  Result Value Ref Range   WBC 3.3 (L) 4.0 - 10.5 K/uL   RBC 4.62 3.87 - 5.11 MIL/uL   Hemoglobin 13.5 12.0 - 15.0 g/dL   HCT 41.7 36.0 - 46.0 %   MCV 90.3 80.0 - 100.0 fL  MCH 29.2 26.0 - 34.0 pg   MCHC 32.4 30.0 - 36.0 g/dL   RDW 14.7 11.5 - 15.5 %   Platelets 179 150 - 400 K/uL   nRBC 0.0 0.0 - 0.2 %   Neutrophils Relative % 72 %   Neutro Abs 2.2 1.7 - 7.7 K/uL   Lymphocytes Relative 17 %   Lymphs Abs 0.5 (L) 0.7 - 4.0 K/uL   Monocytes Relative 10 %   Monocytes Absolute 0.3 0.1 - 1.0 K/uL   Eosinophils Relative 0 %   Eosinophils Absolute 0.0 0.0 - 0.5 K/uL   Basophils Relative 0 %   Basophils Absolute 0.0 0.0 - 0.1 K/uL   Immature Granulocytes 1 %   Abs Immature Granulocytes 0.02 0.00 - 0.07 K/uL  Comprehensive metabolic panel     Status: Abnormal   Collection Time: 01/13/21  5:05 PM  Result Value Ref Range   Sodium 140 135 - 145 mmol/L   Potassium 3.8 3.5 - 5.1 mmol/L   Chloride 104 98 - 111 mmol/L   CO2 28 22 - 32 mmol/L   Glucose, Bld 97 70 - 99 mg/dL   BUN 19 8 - 23 mg/dL   Creatinine, Ser 1.04 (H) 0.44 - 1.00 mg/dL   Calcium 8.2 (L) 8.9 - 10.3 mg/dL   Total Protein 6.1 (L) 6.5 - 8.1 g/dL    Albumin 2.8 (L) 3.5 - 5.0 g/dL   AST 34 15 - 41 U/L   ALT 13 0 - 44 U/L   Alkaline Phosphatase 63 38 - 126 U/L   Total Bilirubin 0.8 0.3 - 1.2 mg/dL   GFR, Estimated 48 (L) >60 mL/min   Anion gap 8 5 - 15  Lactic acid, plasma     Status: None   Collection Time: 01/13/21  5:05 PM  Result Value Ref Range   Lactic Acid, Venous 1.6 0.5 - 1.9 mmol/L   ____________________________________________  EKG My review and personal interpretation at Time: 17:23   Indication: weakness  Rate: 95  Rhythm: sinus with pac Axis: normal Other: lvh, no stemi, nonspecific t wave abn ____________________________________________  RADIOLOGY  I personally reviewed all radiographic images ordered to evaluate for the above acute complaints and reviewed radiology reports and findings.  These findings were personally discussed with the patient.  Please see medical record for radiology report.  ____________________________________________   PROCEDURES  Procedure(s) performed:  Procedures    Critical Care performed: no ____________________________________________   INITIAL IMPRESSION / ASSESSMENT AND PLAN / ED COURSE  Pertinent labs & imaging results that were available during my care of the patient were reviewed by me and considered in my medical decision making (see chart for details).   DDX: dehydration, covid, enteritis, colitis, electrolyte abn  ANEA FODERA is a 87 y.o. who presents to the ED with dehydration as described above.  She is afebrile currently hemodynamically stable was hypotensive with EMS given IV fluids.  Reporting persistent episodes of diarrhea after recent diagnosis of [place] admission for diarrheal illness and dehydration.  Yolanda Bonine states that she has had very poor p.o. intake due to persistent nausea.  On review of records does not look like she got remdesivir during admission she is not complaining of any chest pain or shortness of breath.  Denies any abdominal pain.   Based on her frailty factors I will order remdesivir, continue IV hydration and discussed with hospitalist for admission.     The patient was evaluated in Emergency Department today for the symptoms described  in the history of present illness. He/she was evaluated in the context of the global COVID-19 pandemic, which necessitated consideration that the patient might be at risk for infection with the SARS-CoV-2 virus that causes COVID-19. Institutional protocols and algorithms that pertain to the evaluation of patients at risk for COVID-19 are in a state of rapid change based on information released by regulatory bodies including the CDC and federal and state organizations. These policies and algorithms were followed during the patient's care in the ED.  As part of my medical decision making, I reviewed the following data within the [place] notes reviewed and incorporated, Labs reviewed, notes from prior ED visits and [place] Controlled Substance Database   ____________________________________________   FINAL CLINICAL IMPRESSION(S) / ED DIAGNOSES  Final diagnoses:  Dehydration  Diarrhea of infectious origin  COVID-19      NEW MEDICATIONS STARTED DURING THIS VISIT:  New Prescriptions   No medications on file     Note:  This document was prepared using Dragon voice recognition software and may include unintentional dictation errors.    Merlyn Lot, MD 01/13/21 [phone]

## 2021-01-14 ENCOUNTER — Telehealth: Payer: Medicare Other | Admitting: Nurse Practitioner

## 2021-01-14 ENCOUNTER — Telehealth: Payer: Medicare Other | Admitting: Internal Medicine

## 2021-01-14 DIAGNOSIS — N189 Chronic kidney disease, unspecified: Secondary | ICD-10-CM | POA: Diagnosis not present

## 2021-01-14 DIAGNOSIS — R0602 Shortness of breath: Secondary | ICD-10-CM | POA: Diagnosis not present

## 2021-01-14 DIAGNOSIS — N390 Urinary tract infection, site not specified: Secondary | ICD-10-CM | POA: Diagnosis present

## 2021-01-14 DIAGNOSIS — Z8249 Family history of ischemic heart disease and other diseases of the circulatory system: Secondary | ICD-10-CM | POA: Diagnosis not present

## 2021-01-14 DIAGNOSIS — E860 Dehydration: Secondary | ICD-10-CM | POA: Diagnosis present

## 2021-01-14 DIAGNOSIS — I5032 Chronic diastolic (congestive) heart failure: Secondary | ICD-10-CM | POA: Diagnosis present

## 2021-01-14 DIAGNOSIS — J1282 Pneumonia due to coronavirus disease 2019: Secondary | ICD-10-CM

## 2021-01-14 DIAGNOSIS — R531 Weakness: Secondary | ICD-10-CM | POA: Diagnosis not present

## 2021-01-14 DIAGNOSIS — I959 Hypotension, unspecified: Secondary | ICD-10-CM

## 2021-01-14 DIAGNOSIS — U071 COVID-19: Secondary | ICD-10-CM | POA: Diagnosis present

## 2021-01-14 DIAGNOSIS — E538 Deficiency of other specified B group vitamins: Secondary | ICD-10-CM | POA: Diagnosis present

## 2021-01-14 DIAGNOSIS — Z87440 Personal history of urinary (tract) infections: Secondary | ICD-10-CM | POA: Diagnosis not present

## 2021-01-14 DIAGNOSIS — Z800 Family history of malignant neoplasm of digestive organs: Secondary | ICD-10-CM | POA: Diagnosis not present

## 2021-01-14 DIAGNOSIS — I130 Hypertensive heart and chronic kidney disease with heart failure and stage 1 through stage 4 chronic kidney disease, or unspecified chronic kidney disease: Secondary | ICD-10-CM | POA: Diagnosis present

## 2021-01-14 DIAGNOSIS — N179 Acute kidney failure, unspecified: Secondary | ICD-10-CM | POA: Diagnosis present

## 2021-01-14 DIAGNOSIS — E7800 Pure hypercholesterolemia, unspecified: Secondary | ICD-10-CM | POA: Diagnosis present

## 2021-01-14 DIAGNOSIS — E43 Unspecified severe protein-calorie malnutrition: Secondary | ICD-10-CM | POA: Diagnosis present

## 2021-01-14 DIAGNOSIS — Z9049 Acquired absence of other specified parts of digestive tract: Secondary | ICD-10-CM | POA: Diagnosis not present

## 2021-01-14 DIAGNOSIS — Z7982 Long term (current) use of aspirin: Secondary | ICD-10-CM | POA: Diagnosis not present

## 2021-01-14 DIAGNOSIS — B9620 Unspecified Escherichia coli [E. coli] as the cause of diseases classified elsewhere: Secondary | ICD-10-CM

## 2021-01-14 DIAGNOSIS — A09 Infectious gastroenteritis and colitis, unspecified: Secondary | ICD-10-CM | POA: Diagnosis present

## 2021-01-14 DIAGNOSIS — Z681 Body mass index (BMI) 19 or less, adult: Secondary | ICD-10-CM | POA: Diagnosis not present

## 2021-01-14 DIAGNOSIS — K219 Gastro-esophageal reflux disease without esophagitis: Secondary | ICD-10-CM | POA: Diagnosis present

## 2021-01-14 DIAGNOSIS — Z66 Do not resuscitate: Secondary | ICD-10-CM | POA: Diagnosis present

## 2021-01-14 DIAGNOSIS — Z87891 Personal history of nicotine dependence: Secondary | ICD-10-CM | POA: Diagnosis not present

## 2021-01-14 DIAGNOSIS — J440 Chronic obstructive pulmonary disease with acute lower respiratory infection: Secondary | ICD-10-CM | POA: Diagnosis present

## 2021-01-14 DIAGNOSIS — Z79899 Other long term (current) drug therapy: Secondary | ICD-10-CM | POA: Diagnosis not present

## 2021-01-14 DIAGNOSIS — L89222 Pressure ulcer of left hip, stage 2: Secondary | ICD-10-CM | POA: Diagnosis present

## 2021-01-14 LAB — URINALYSIS, COMPLETE (UACMP) WITH MICROSCOPIC
Bacteria, UA: NONE SEEN
Bilirubin Urine: NEGATIVE
Glucose, UA: NEGATIVE mg/dL
Hgb urine dipstick: NEGATIVE
Ketones, ur: NEGATIVE mg/dL
Leukocytes,Ua: NEGATIVE
Nitrite: NEGATIVE
Protein, ur: 30 mg/dL — AB
Specific Gravity, Urine: 1.025 (ref 1.005–1.030)
pH: 6 (ref 5.0–8.0)

## 2021-01-14 MED ORDER — AZITHROMYCIN 500 MG PO TABS
500.0000 mg | ORAL_TABLET | Freq: Every day | ORAL | Status: AC
  Administered 2021-01-14: 500 mg via ORAL
  Filled 2021-01-14: qty 1

## 2021-01-14 MED ORDER — IPRATROPIUM-ALBUTEROL 20-100 MCG/ACT IN AERS
1.0000 | INHALATION_SPRAY | Freq: Four times a day (QID) | RESPIRATORY_TRACT | Status: DC
  Administered 2021-01-14 – 2021-01-18 (×16): 1 via RESPIRATORY_TRACT
  Filled 2021-01-14: qty 4

## 2021-01-14 MED ORDER — ENSURE ENLIVE PO LIQD
237.0000 mL | Freq: Three times a day (TID) | ORAL | Status: DC
  Administered 2021-01-14 – 2021-01-18 (×11): 237 mL via ORAL

## 2021-01-14 MED ORDER — ASCORBIC ACID 500 MG PO TABS
250.0000 mg | ORAL_TABLET | Freq: Every day | ORAL | Status: DC
  Administered 2021-01-14 – 2021-01-18 (×5): 250 mg via ORAL
  Filled 2021-01-14 (×5): qty 1

## 2021-01-14 MED ORDER — AZITHROMYCIN 500 MG PO TABS
250.0000 mg | ORAL_TABLET | Freq: Every day | ORAL | Status: AC
  Administered 2021-01-15 – 2021-01-18 (×4): 250 mg via ORAL
  Filled 2021-01-14 (×4): qty 1

## 2021-01-14 MED ORDER — ZINC SULFATE 220 (50 ZN) MG PO CAPS
220.0000 mg | ORAL_CAPSULE | Freq: Every day | ORAL | Status: DC
  Administered 2021-01-14 – 2021-01-18 (×5): 220 mg via ORAL
  Filled 2021-01-14 (×5): qty 1

## 2021-01-14 NOTE — Telephone Encounter (Signed)
See other note

## 2021-01-14 NOTE — TOC Initial Note (Signed)
Transition of Care [facility]) - Initial/Assessment Note    Patient Details  Name: Taylor Watson MRN: [id number] Date of Birth: 02-03-1919  Transition of Care [facility]) CM/SW Contact:    Shelbie Hutching, RN Phone Number: 01/14/2021, 2:43 PM  Clinical Narrative:                  Patient admitted to the hospital with dehydration and COVID, recently discharged home from hospital for [place].  Patient lives with her daughter, [name].  Patient is on chronic O2 at home 3-4 L.  She walks with a walker at baseline and can even prepare simple meals like scramble eggs for breakfast.   Patient discharged from the hospital last time with [facility] for PT, OT and [place] with [facility] notified of admission.    Expected Discharge Plan: [place] Barriers to Discharge: Continued Medical Work up   Patient Goals and CMS Choice   CMS Medicare.gov Compare Post Acute Care list provided to:: Patient Represenative (must comment) Choice offered to / list presented to : Adult Children  Expected Discharge Plan and Services Expected Discharge Plan: [place]   Discharge Planning Services: CM Consult Post Acute Care Choice: Home Health, Resumption of Svcs/PTA Provider Living arrangements for the past 2 months: Single Family Home                 DME Arranged: N/A DME Agency: NA       HH Arranged: PT, OT, Social Work [employer] Agency: Microbiologist ([place]) Date [place]: 01/14/21 Time [place]: 1442 Representative spoke with at [place]: [place] Arrangements/Services Living arrangements for the past 2 months: [place] Lives with:: Adult Children Patient language and need for interpreter reviewed:: Yes Do you feel safe going back to the place where you live?: Yes      Need for Family Participation in Patient Care: Yes (Comment) Care giver support system in place?: Yes (comment) Current home services: DME  (rollator, walker, stool) Criminal Activity/Legal Involvement Pertinent to Current Situation/Hospitalization: No - Comment as needed  Activities of Daily Living      Permission Sought/Granted Permission sought to share information with : Case Manager, Family Supports, Other (comment) Permission granted to share information with : Yes, Verbal Permission Granted  Share Information with NAME: [name] and [name]     Permission granted to share info w Relationship: daughter and grandson     Emotional Assessment       Orientation: : Oriented to Self, Oriented to Place Alcohol / Substance Use: Not Applicable Psych Involvement: No (comment)  Admission diagnosis:  Dehydration [E86.0] Diarrhea of infectious origin [A09] Hypotension [I95.9] COVID-19 [U07.1] Pneumonia due to COVID-19 virus [U07.1, J12.82] Patient Active Problem List   Diagnosis Date Noted   E. coli UTI 01/14/2021   Hypotension 01/13/2021   AKI (acute kidney injury) ([place]) 01/13/2021   Protein-calorie malnutrition, severe 01/08/2021   Pneumonia due to COVID-19 virus 01/07/2021   Diarrhea 01/06/2021   COVID-19 virus infection 01/06/2021   Hypoglycemia without diagnosis of diabetes mellitus 01/06/2021   COPD (chronic obstructive pulmonary disease) ([place]) 11/03/2020   Aortic atherosclerosis ([place]) 11/02/2020   Hypertensive kidney disease with CKD (chronic kidney disease) stage V ([place]) 11/02/2020   CKD (chronic kidney disease), stage III ([place]) 11/02/2020   Incontinence 11/02/2020   Conjunctivitis 10/03/2020   Discoloration of skin 07/25/2020   Bad odor of urine 05/11/2020  Bradycardia 03/29/2020   Sebaceous cyst 03/29/2020   Weakness 03/29/2020   UTI (urinary tract infection) 03/19/2020   NSTEMI (non-ST elevated myocardial infarction) ([place]) 03/19/2020   Chronic diastolic CHF (congestive heart failure) ([place])    B12 deficiency 10/13/2019   Skin lesion 08/19/2019   Acute exacerbation of CHF (congestive heart failure)  ([place]) 07/22/2019   Acute respiratory failure with hypoxia (HCC) 07/22/2019   Leg swelling 07/22/2019   Atrial fibrillation ([place]) 07/22/2019   Fall at home, initial encounter 07/22/2019   Volume overload 07/19/2019   Hypertensive urgency 43/15/4008   Acute metabolic encephalopathy 67/61/9509   Elevated troponin 05/18/2019   Hypokalemia 05/18/2019   URI (upper respiratory infection) 08/26/2018   Headache 07/17/2017   SOB (shortness of breath) 03/28/2016   Near syncope 03/01/2016   Anemia 10/28/2015   Lower GI bleed    Goals of care, counseling/discussion    Blood loss anemia 06/01/2015   GERD (gastroesophageal reflux disease)    Mild depression ([place])    Unsteady gait 02/08/2015   Schwannoma 10/07/2013   Rectal bleeding 04/03/2013   Light headedness 01/19/2013   Loss of weight 10/22/2012   Hypercholesterolemia 10/22/2012   History of CVA (cerebrovascular accident) 10/22/2012   Anxiety 10/22/2012   Essential hypertension, benign 10/22/2012   PCP:  [name], MD Pharmacy:   [facility], [place] [address] Phone: [phone] Fax: [phone]  [id number] - [place], [place] - [address] [place] [address] Phone: [phone] Fax: [phone]     Social Determinants of Health (SDOH) Interventions    Readmission Risk Interventions No flowsheet data found.

## 2021-01-14 NOTE — Progress Notes (Signed)
Patient ID: Taylor Watson, female   DOB: 1918/08/29, 87 y.o.   MRN: [id number] [facility] PROGRESS NOTE  Taylor Watson HFW:[id number] DOB: 18-Sep-1918 DOA: 01/13/2021 PCP: [name], MD  HPI/Subjective: Patient coughing when I was in the room.  Little bit short of breath.  Brought in by family for worsening COVID symptoms and weakness and unable to stand.  Found to have COVID-19 pneumonia and started on remdesivir.  Objective: Vitals:   01/14/21 0334 01/14/21 0912  BP: 108/68 (!) 97/48  Pulse: (!) 44 (!) 51  Resp: 19 16  Temp: 98 F (36.7 C) 97.7 F (36.5 C)  SpO2: 92% 92%    Intake/Output Summary (Last 24 hours) at 01/14/2021 1159 Last data filed at 01/14/2021 0600 Gross per 24 hour  Intake 583.33 ml  Output --  Net 583.33 ml   Filed Weights   01/13/21 1716  Weight: 49.6 kg    ROS: Review of Systems  Respiratory:  Positive for cough and shortness of breath.   Cardiovascular:  Negative for chest pain.  Gastrointestinal:  Negative for abdominal pain.  Exam: Physical Exam HENT:     Head: Normocephalic.     Mouth/Throat:     Pharynx: No oropharyngeal exudate.  Eyes:     General: Lids are normal.     Conjunctiva/sclera: Conjunctivae normal.  Cardiovascular:     Rate and Rhythm: Normal rate and regular rhythm.     Heart sounds: Normal heart sounds, S1 normal and S2 normal.  Pulmonary:     Breath sounds: Examination of the right-lower field reveals decreased breath sounds and rhonchi. Examination of the left-lower field reveals decreased breath sounds and rhonchi. Decreased breath sounds and rhonchi present. No wheezing or rales.  Abdominal:     Palpations: Abdomen is soft.     Tenderness: There is no abdominal tenderness.  Musculoskeletal:     Right ankle: No swelling.     Left ankle: No swelling.  Neurological:     Mental Status: She is alert.     Comments: Patient answers yes or no questions appropriately.      Scheduled Meds:  vitamin C   250 mg Oral Daily   enoxaparin (LOVENOX) injection  30 mg Subcutaneous Q24H   feeding supplement  237 mL Oral TID BM   Ipratropium-Albuterol  1 puff Inhalation Q6H   zinc sulfate  220 mg Oral Daily   Continuous Infusions:  cefTRIAXone (ROCEPHIN)  IV 1 g (01/14/21 0224)   remdesivir 100 mg in NS 100 mL 100 mg (01/14/21 0916)    Assessment/Plan:  COVID-19 pneumonia with worsening infiltrates.  Patient started on remdesivir.  Oxygen as needed.  Add Combivent, zinc and vitamin C.  Empiric Rocephin and Zithromax. Weakness.  Physical therapy evaluation Acute kidney injury.  Creatinine on presentation 1.04 and on disposition last time 0.68.  Patient received IV fluids overnight.  We will discontinue fluids and monitor. E. coli urinary tract infection previously.  Continue Rocephin.  Still waiting on urine analysis from this hospitalization Hypotension on presentation holding Entresto and Lasix for right now. Chronic diastolic congestive heart failure.  Holding Entresto and Lasix  Code Status:     Code Status Orders  (From admission, onward)           Start     Ordered   01/13/21 1831  Full code  Continuous        01/13/21 1831           Code Status History  Date Active Date Inactive Code Status Order ID Comments User Context   01/06/2021 1615 01/08/2021 2147 Full Code [id number]  Jacqlyn Krauss, MD ED   03/19/2020 1743 03/20/2020 2110 Full Code [id number]  Ivor Costa, MD Inpatient   03/19/2020 1734 03/19/2020 1743 DNR [id number]  Ivor Costa, MD Inpatient   07/25/2019 0804 07/25/2019 2208 DNR [id number]  Enzo Bi, MD Inpatient   07/19/2019 1404 07/25/2019 0804 Full Code [id number]  Enzo Bi, MD ED   05/18/2019 1343 05/19/2019 1943 Full Code [id number]  Ivor Costa, MD ED   06/01/2015 0115 06/05/2015 1636 DNR [id number]  Ivor Costa, MD Inpatient   05/31/2015 2157 06/01/2015 0115 Full Code [id number]  Rhona Leavens, RN Inpatient   05/29/2015 2140 05/31/2015 2157 DNR [id number]  Bettey Costa, MD Inpatient      Family Communication: Spoke with grandson on the phone Disposition Plan: Status is: Observation  Dispo: The patient is from: Home              Anticipated d/c is to: Rehab versus home with home health depending on clinical course              Patient currently being treated for COVID-19 pneumonia with remdesivir.  Patient with worsening symptoms since last admission.   Difficult to place patient.  Yes, if will need rehab.  Antibiotics: Remdesivir Rocephin Zithromax  Time spent: 28 minutes  [place]

## 2021-01-14 NOTE — Telephone Encounter (Signed)
Patient being seen virtually today

## 2021-01-14 NOTE — Telephone Encounter (Signed)
Patient is currently admitted at [facility].  ?

## 2021-01-15 DIAGNOSIS — I959 Hypotension, unspecified: Secondary | ICD-10-CM | POA: Diagnosis not present

## 2021-01-15 DIAGNOSIS — R531 Weakness: Secondary | ICD-10-CM | POA: Diagnosis not present

## 2021-01-15 DIAGNOSIS — U071 COVID-19: Secondary | ICD-10-CM | POA: Diagnosis not present

## 2021-01-15 DIAGNOSIS — N179 Acute kidney failure, unspecified: Secondary | ICD-10-CM | POA: Diagnosis not present

## 2021-01-15 LAB — CBC
HCT: 42.8 % (ref 36.0–46.0)
Hemoglobin: 13.7 g/dL (ref 12.0–15.0)
MCH: 28.5 pg (ref 26.0–34.0)
MCHC: 32 g/dL (ref 30.0–36.0)
MCV: 89 fL (ref 80.0–100.0)
Platelets: 192 10*3/uL (ref 150–400)
RBC: 4.81 MIL/uL (ref 3.87–5.11)
RDW: 14.9 % (ref 11.5–15.5)
WBC: 2.4 10*3/uL — ABNORMAL LOW (ref 4.0–10.5)
nRBC: 0 % (ref 0.0–0.2)

## 2021-01-15 LAB — BASIC METABOLIC PANEL
Anion gap: 7 (ref 5–15)
BUN: 30 mg/dL — ABNORMAL HIGH (ref 8–23)
CO2: 27 mmol/L (ref 22–32)
Calcium: 7.9 mg/dL — ABNORMAL LOW (ref 8.9–10.3)
Chloride: 108 mmol/L (ref 98–111)
Creatinine, Ser: 0.93 mg/dL (ref 0.44–1.00)
GFR, Estimated: 55 mL/min — ABNORMAL LOW (ref >60)
Glucose, Bld: 86 mg/dL (ref 70–99)
Potassium: 4.1 mmol/L (ref 3.5–5.1)
Sodium: 142 mmol/L (ref 135–145)

## 2021-01-15 MED ORDER — MIDODRINE HCL 5 MG PO TABS
2.5000 mg | ORAL_TABLET | Freq: Three times a day (TID) | ORAL | Status: DC
  Administered 2021-01-15: 2.5 mg via ORAL
  Filled 2021-01-15: qty 1

## 2021-01-15 MED ORDER — SODIUM CHLORIDE 0.9 % IV BOLUS
250.0000 mL | Freq: Once | INTRAVENOUS | Status: AC
  Administered 2021-01-15: 250 mL via INTRAVENOUS

## 2021-01-15 MED ORDER — ADULT MULTIVITAMIN W/MINERALS CH
1.0000 | ORAL_TABLET | Freq: Every day | ORAL | Status: DC
  Administered 2021-01-15 – 2021-01-18 (×4): 1 via ORAL
  Filled 2021-01-15 (×4): qty 1

## 2021-01-15 MED ORDER — DEXAMETHASONE 4 MG PO TABS
6.0000 mg | ORAL_TABLET | Freq: Every day | ORAL | Status: DC
  Administered 2021-01-15 – 2021-01-18 (×4): 6 mg via ORAL
  Filled 2021-01-15 (×4): qty 2

## 2021-01-15 NOTE — Evaluation (Signed)
Physical Therapy Evaluation Patient Details Name: Taylor Watson MRN: [id number] DOB: 1918/12/22 Today's Date: 01/15/2021  History of Present Illness  87 y.o. female with medical history significant for diastolic CHF, CVA, hyperlipidemia, depression, and hypertension who presented with cough and diarrhea over a couple of days. Pt tested positive for COVID-19 on recent hospitalization with hypoglycemia, generalized weakness, and thrombocytopenia.  Pt discharged home but apparently struggled with mobility and severe weakness.  Now here with hypotension and weakness.  Clinical Impression  Pt pleasant and willing to work with PT, clearly very weak and limited functionally.  Pt here with similar earlier this month and despite PT recs of STR she discharged home with family assist; apparently she was not getting better (functionally or otherwise) at home and simply was not doing well.  Recommending STR at this time as pt is unable to even maintain upright static standing.     Recommendations for follow up therapy are one component of a multi-disciplinary discharge planning process, led by the attending physician.  Recommendations may be updated based on patient status, additional functional criteria and insurance authorization.  Follow Up Recommendations SNF;Supervision/Assistance - 24 hour    Equipment Recommendations  None recommended by PT    Recommendations for Other Services       Precautions / Restrictions Precautions Precautions: Fall Restrictions Weight Bearing Restrictions: No      Mobility  Bed Mobility Overal bed mobility: Needs Assistance Bed Mobility: Rolling;Supine to Sit Rolling: Min assist         General bed mobility comments: Extra time, effort, use of rail, and cues for sequencing.  Good effort, definite need of direct assist.    Transfers Overall transfer level: Needs assistance Equipment used: Rolling walker (2 wheeled) Transfers: Sit to/from Stand Sit  to Stand: Mod assist         General transfer comment: Pt very much struggled to assume fully upright.  Needed constant phyiscal and verbal cuing to keep weight forward/over the walker.  W/o assist pt instantly leaning back on heels/back of legs on bed/losing balance backward.  Ambulation/Gait                Stairs            Wheelchair Mobility    Modified Rankin (Stroke Patients Only)       Balance Overall balance assessment: Needs assistance Sitting-balance support: Feet supported;No upper extremity supported Sitting balance-Leahy Scale: Fair     Standing balance support: During functional activity;Bilateral upper extremity supported Standing balance-Leahy Scale: Poor                               Pertinent Vitals/Pain Pain Assessment: No/denies pain    Home Living Family/patient expects to be discharged to:: Skilled nursing facility Living Arrangements: Children Available Help at Discharge: Available PRN/intermittently;Family Type of Home: Mobile home Home Access: Stairs to enter Entrance Stairs-Rails: Right Entrance Stairs-Number of Steps: 3 Home Layout: One level Home Equipment: Powers - 4 wheels;Cane - single point (home O2 3-4L (?))      Prior Function Level of Independence: Independent with assistive device(s)         Comments: Uses 4WW for functional mobility. Pt denies falls in last 6 months. Dtr assists with IADL management including driving and cooking. Typically, able to dress and sponge bathe without assistance, now struggling.     Hand Dominance        Extremity/Trunk Assessment  Upper Extremity Assessment Upper Extremity Assessment: Generalized weakness    Lower Extremity Assessment Lower Extremity Assessment: Generalized weakness       Communication   Communication: No difficulties  Cognition Arousal/Alertness: Awake/alert Behavior During Therapy: WFL for tasks assessed/performed                                    General Comments: alert and oriented to self, place, limited situation. able to follow 1-step VCs consistently during session.      General Comments General comments (skin integrity, edema, etc.): pt on 2L O2 t/o session, sats remained in 90s    Exercises     Assessment/Plan    PT Assessment Patient needs continued PT services  PT Problem List Decreased strength;Decreased activity tolerance;Decreased balance;Decreased mobility;Decreased knowledge of use of DME       PT Treatment Interventions DME instruction;Gait training;Stair training;Functional mobility training;Therapeutic activities;Therapeutic exercise;Balance training;Patient/family education    PT Goals (Current goals can be found in the Care Plan section)  Acute Rehab PT Goals Patient Stated Goal: get stronger PT Goal Formulation: With patient Time For Goal Achievement: 01/20/21 Potential to Achieve Goals: Good    Frequency Min 2X/week   Barriers to discharge        Co-evaluation               AM-PAC PT "6 Clicks" Mobility  Outcome Measure Help needed turning from your back to your side while in a flat bed without using bedrails?: A Little Help needed moving from lying on your back to sitting on the side of a flat bed without using bedrails?: A Little Help needed moving to and from a bed to a chair (including a wheelchair)?: A Lot Help needed standing up from a chair using your arms (e.g., wheelchair or bedside chair)?: A Lot Help needed to walk in hospital room?: Total Help needed climbing 3-5 steps with a railing? : Total 6 Click Score: 12    End of Session Equipment Utilized During Treatment: Gait belt Activity Tolerance: Patient tolerated treatment well Patient left: in chair;with call bell/phone within reach;with chair alarm set Nurse Communication: Mobility status PT Visit Diagnosis: Difficulty in walking, not elsewhere classified (R26.2);Muscle weakness (generalized)  (M62.81)    Time: 8916-9450 PT Time Calculation (min) (ACUTE ONLY): 33 min   Charges:   PT Evaluation $PT Eval Low Complexity: 1 Low PT Treatments $Therapeutic Activity: 8-22 mins        Kreg Shropshire, DPT 01/15/2021, 1:30 PM

## 2021-01-15 NOTE — Progress Notes (Signed)
Initial Nutrition Assessment  DOCUMENTATION CODES:  Underweight, Severe malnutrition in context of chronic illness  INTERVENTION:  Continue current diet as ordered, encourage PO intake MVI with minerals daily Ensure Enlive po TID, each supplement provides 350 kcal and 20 grams of protein  NUTRITION DIAGNOSIS:  Severe Malnutrition (in the context of chronic illness (CHF)) related to  (inadequate energy intake) as evidenced by severe muscle depletion, severe fat depletion.  GOAL:  Patient will meet greater than or equal to 90% of their needs  MONITOR:  PO intake, Supplement acceptance, Weight trends  REASON FOR ASSESSMENT:  Other (Comment) (Low BMI)    ASSESSMENT:  87 y.o. female with medical history of CHF, CVA, GERD, HTN, HLD, and depression, presented to ED after being discharged earlier in the week. Pt dx with COVID19 during last admission and family reports an increase in symptoms and weakness since discharge.  Pt seen by RD last week prior to being discharged home. Will add back previous interventions and continue to monitor. Pt unable to provide a complete nutrition hx, some confusion present. Poor intake continues.   Discussed in rounds, CM working on SNF workup as family is now in agreement that pt is too weak to return home.  Severe muscle and fat deficits present on assessment.  Nutritionally Relevant Medications: Scheduled Meds:  vitamin C  250 mg Oral Daily   azithromycin  250 mg Oral Daily   feeding supplement  237 mL Oral TID BM   zinc sulfate  220 mg Oral Daily   Continuous Infusions:  cefTRIAXone (ROCEPHIN)  IV 1 g (01/15/21 0319)   remdesivir 100 mg in NS 100 mL 100 mg (01/15/21 1006)   PRN Meds:.loperamide, ondansetron  Labs Reviewed: BUN 30  NUTRITION - FOCUSED PHYSICAL EXAM: Flowsheet Row Most Recent Value  Orbital Region Severe depletion  Upper Arm Region Severe depletion  Thoracic and Lumbar Region Severe depletion  Buccal Region Severe  depletion  Temple Region Moderate depletion  Clavicle Bone Region Severe depletion  Clavicle and Acromion Bone Region Severe depletion  Scapular Bone Region Severe depletion  Dorsal Hand Severe depletion  Patellar Region Severe depletion  Anterior Thigh Region Severe depletion  Posterior Calf Region Severe depletion  Edema (RD Assessment) None  Hair Reviewed  Eyes Reviewed  Mouth Reviewed  Skin Reviewed  Nails Reviewed   Diet Order:   Diet Order             DIET DYS 3 Room service appropriate? Yes; Fluid consistency: Thin  Diet effective now                   EDUCATION NEEDS:  No education needs have been identified at this time  Skin:  Skin Assessment: Reviewed RN Assessment  Last BM:  9/15  Height:  Ht Readings from Last 1 Encounters:  01/13/21 5\' 6"  (1.676 m)   Weight:  Wt Readings from Last 1 Encounters:  01/13/21 49.6 kg   Ideal Body Weight:  59.1 kg  BMI:  Body mass index is 17.65 kg/m.  Estimated Nutritional Needs:  Kcal:  1400-1600 kcal/d Protein:  70-80 g/d Fluid:  1.5-1.8 L/d   Ranell Patrick, RD, LDN Clinical Dietitian Pager on [place]

## 2021-01-15 NOTE — TOC Progression Note (Signed)
Transition of Care [facility]) - Progression Note    Patient Details  Name: Taylor Watson MRN: [id number] Date of Birth: [birth year]  [facility]) CM/SW Contact  Shelbie Hutching, RN Phone Number: 01/15/2021, 3:52 PM  Clinical Narrative:    RNCM spoke with patient's grandson via phone today.  [name] reports that he patient is so weak and would like for patient to go to SNF this time.  Family prefers [facility], patient has been there in the past and it is close to their home.  Bed request sent out to [place].     Expected Discharge Plan: [place] Barriers to Discharge: Continued Medical Work up  Expected Discharge Plan and Services Expected Discharge Plan: [place]   Discharge Planning Services: CM Consult Post Acute Care Choice: [place] Living arrangements for the past 2 months: Single Family Home                 DME Arranged: N/A DME Agency: NA       HH Arranged: NA HH Agency: NA Date HH Agency Contacted: 01/14/21 Time [place]: 4196 Representative spoke with at [place]: SNF referral   Social Determinants of Health (SDOH) Interventions    Readmission Risk Interventions No flowsheet data found.

## 2021-01-15 NOTE — NC FL2 (Signed)
[place] LEVEL OF CARE SCREENING TOOL     IDENTIFICATION  Patient Name: Taylor Watson Birthdate: 11-21-1918 Sex: female Admission Date (Current Location): 01/13/2021  [facility] and [place] Number:  Engineering geologist and Address:  [facility], [address] [place], [place] [address]      Provider Number: [id number]  Attending Physician Name and Address:  Loletha Grayer, MD  Relative Name and Phone Number:  Nicanor Alcon (daughter) [phone]    Current Level of Care: Hospital Recommended Level of Care: [place] Prior Approval Number:    Date Approved/Denied:   PASRR Number: [id number]  Discharge Plan: SNF    Current Diagnoses: Patient Active Problem List   Diagnosis Date Noted   E. coli UTI 01/14/2021   Hypotension 01/13/2021   AKI (acute kidney injury) ([place]) 01/13/2021   Protein-calorie malnutrition, severe 01/08/2021   Pneumonia due to COVID-19 virus 01/07/2021   Diarrhea 01/06/2021   COVID-19 virus infection 01/06/2021   Hypoglycemia without diagnosis of diabetes mellitus 01/06/2021   COPD (chronic obstructive pulmonary disease) ([place]) 11/03/2020   Aortic atherosclerosis ([place]) 11/02/2020   Hypertensive kidney disease with CKD (chronic kidney disease) stage V ([place]) 11/02/2020   CKD (chronic kidney disease), stage III ([place]) 11/02/2020   Incontinence 11/02/2020   Conjunctivitis 10/03/2020   Discoloration of skin 07/25/2020   Bad odor of urine 05/11/2020   Bradycardia 03/29/2020   Sebaceous cyst 03/29/2020   Weakness 03/29/2020   UTI (urinary tract infection) 03/19/2020   NSTEMI (non-ST elevated myocardial infarction) ([place]) 03/19/2020   Chronic diastolic CHF (congestive heart failure) ([place])    B12 deficiency 10/13/2019   Skin lesion 08/19/2019   Acute exacerbation of CHF (congestive heart failure) ([place]) 07/22/2019   Acute respiratory failure with hypoxia (HCC) 07/22/2019   Leg swelling  07/22/2019   Atrial fibrillation ([place]) 07/22/2019   Fall at home, initial encounter 07/22/2019   Volume overload 07/19/2019   Hypertensive urgency 24/82/5003   Acute metabolic encephalopathy 70/48/8891   Elevated troponin 05/18/2019   Hypokalemia 05/18/2019   URI (upper respiratory infection) 08/26/2018   Headache 07/17/2017   SOB (shortness of breath) 03/28/2016   Near syncope 03/01/2016   Anemia 10/28/2015   Lower GI bleed    Goals of care, counseling/discussion    Blood loss anemia 06/01/2015   GERD (gastroesophageal reflux disease)    Mild depression ([place])    Unsteady gait 02/08/2015   Schwannoma 10/07/2013   Rectal bleeding 04/03/2013   Light headedness 01/19/2013   Loss of weight 10/22/2012   Hypercholesterolemia 10/22/2012   History of CVA (cerebrovascular accident) 10/22/2012   Anxiety 10/22/2012   Essential hypertension, benign 10/22/2012    Orientation RESPIRATION BLADDER Height & Weight     Self  Normal Incontinent, External catheter Weight: 49.6 kg Height:  5\' 6"  (167.6 cm)  BEHAVIORAL SYMPTOMS/MOOD NEUROLOGICAL BOWEL NUTRITION STATUS      Incontinent Diet (see discharge summary)  AMBULATORY STATUS COMMUNICATION OF NEEDS Skin   Extensive Assist Verbally Normal                       Personal Care Assistance Level of Assistance  Bathing, Feeding, Dressing Bathing Assistance: Maximum assistance Feeding assistance: Limited assistance Dressing Assistance: Maximum assistance     Functional Limitations Info  Sight, Hearing, Speech Sight Info: Adequate Hearing Info: Adequate Speech Info: Adequate    SPECIAL CARE FACTORS FREQUENCY  PT (By licensed PT), OT (By licensed OT)  PT Frequency: 5 times per week OT Frequency: 5 times per week            Contractures Contractures Info: Not present    Additional Factors Info  Code Status, Allergies Code Status Info: Full Allergies Info: Dyazide, toprol xl, monopril, verapamil            Current Medications (01/15/2021):  This is the current hospital active medication list Current Facility-Administered Medications  Medication Dose Route Frequency Provider Last Rate Last Admin   ascorbic acid (VITAMIN C) tablet 250 mg  250 mg Oral Daily Leslye Peer, Richard, MD   250 mg at 01/15/21 1001   azithromycin (ZITHROMAX) tablet 250 mg  250 mg Oral Daily Wieting, Richard, MD   250 mg at 01/15/21 1002   cefTRIAXone (ROCEPHIN) 1 g in sodium chloride 0.9 % 100 mL IVPB  1 g Intravenous Q24H Tu, Ching T, DO 200 mL/hr at 01/15/21 0319 1 g at 01/15/21 0319   dexamethasone (DECADRON) tablet 6 mg  6 mg Oral Daily Loletha Grayer, MD   6 mg at 01/15/21 1409   enoxaparin (LOVENOX) injection 30 mg  30 mg Subcutaneous Q24H Tu, Ching T, DO   30 mg at 01/14/21 2030   feeding supplement (ENSURE ENLIVE / ENSURE PLUS) liquid 237 mL  237 mL Oral TID BM Loletha Grayer, MD   237 mL at 01/15/21 1414   Ipratropium-Albuterol (COMBIVENT) respimat 1 puff  1 puff Inhalation Q6H Wieting, Richard, MD   1 puff at 01/15/21 1415   loperamide (IMODIUM) capsule 2 mg  2 mg Oral PRN Tu, Ching T, DO       midodrine (PROAMATINE) tablet 2.5 mg  2.5 mg Oral TID WC Wieting, Richard, MD       multivitamin with minerals tablet 1 tablet  1 tablet Oral Daily Wieting, Richard, MD   1 tablet at 01/15/21 1409   ondansetron (ZOFRAN) injection 4 mg  4 mg Intravenous Q6H PRN Tu, Ching T, DO       remdesivir 100 mg in sodium chloride 0.9 % 100 mL IVPB  100 mg Intravenous Daily Merlyn Lot, MD 200 mL/hr at 01/15/21 1006 100 mg at 01/15/21 1006   zinc sulfate capsule 220 mg  220 mg Oral Daily Loletha Grayer, MD   220 mg at 01/15/21 1001     Discharge Medications: Please see discharge summary for a list of discharge medications.  Relevant Imaging Results:  Relevant Lab Results:   Additional Information SS# [id number]  Shelbie Hutching, RN

## 2021-01-15 NOTE — Progress Notes (Signed)
Patient ID: Taylor Watson, female   DOB: 10/12/1918, 87 y.o.   MRN: [id number] [facility] PROGRESS NOTE  CARENA STREAM DDU:[id number] DOB: Sep 01, 1918 DOA: 01/13/2021 PCP: [name], MD  HPI/Subjective: Patient answers some yes or no questions.  Poor appetite.  Feeling weak.  Admitted with COVID-19 pneumonia infection and weakness.  Objective: Vitals:   01/15/21 0651 01/15/21 1221  BP: (!) 99/48   Pulse: (!) 51 68  Resp: 15   Temp: (!) 97.4 F (36.3 C)   SpO2: 100%     Intake/Output Summary (Last 24 hours) at 01/15/2021 1354 Last data filed at 01/14/2021 1500 Gross per 24 hour  Intake 100 ml  Output --  Net 100 ml   Filed Weights   01/13/21 1716  Weight: 49.6 kg    ROS: Review of Systems  Respiratory:  Positive for cough and shortness of breath.   Cardiovascular:  Negative for chest pain.  Gastrointestinal:  Negative for abdominal pain, nausea and vomiting.  Exam: Physical Exam HENT:     Head: Normocephalic.     Mouth/Throat:     Pharynx: No oropharyngeal exudate.  Eyes:     General: Lids are normal.     Conjunctiva/sclera: Conjunctivae normal.     Pupils: Pupils are equal, round, and reactive to light.  Cardiovascular:     Rate and Rhythm: Normal rate and regular rhythm.     Heart sounds: Normal heart sounds, S1 normal and S2 normal.  Pulmonary:     Breath sounds: Examination of the right-lower field reveals decreased breath sounds and rhonchi. Examination of the left-lower field reveals decreased breath sounds and rhonchi. Decreased breath sounds and rhonchi present. No wheezing or rales.  Abdominal:     Palpations: Abdomen is soft.     Tenderness: There is no abdominal tenderness.  Musculoskeletal:     Right lower leg: No swelling.     Left lower leg: No swelling.  Skin:    General: Skin is warm.     Findings: No rash.  Neurological:     Mental Status: She is alert.     Comments: Answers yes or no questions.  Able to straight leg  raise bilaterally.      Scheduled Meds:  vitamin C  250 mg Oral Daily   azithromycin  250 mg Oral Daily   dexamethasone  6 mg Oral Daily   enoxaparin (LOVENOX) injection  30 mg Subcutaneous Q24H   feeding supplement  237 mL Oral TID BM   Ipratropium-Albuterol  1 puff Inhalation Q6H   midodrine  2.5 mg Oral TID WC   multivitamin with minerals  1 tablet Oral Daily   zinc sulfate  220 mg Oral Daily   Continuous Infusions:  cefTRIAXone (ROCEPHIN)  IV 1 g (01/15/21 0319)   remdesivir 100 mg in NS 100 mL 100 mg (01/15/21 1006)    Assessment/Plan:  COVID-19 pneumonia with worsening infiltrates.  This morning she was on oxygen.  Continue remdesivir.  Patient also on empiric Rocephin and Zithromax.  We will add Decadron 6 mg daily.  Continue Combivent, zinc and vitamin C Relative hypotension.  Holding Entresto and Lasix at this point.  Start low-dose midodrine.  We will give 250 cc fluid bolus Weakness.  Physical therapy recommending rehab.  Family preferably would like to take her home with home health. Acute kidney injury on presentation with a creatinine of 1.04.  Today's creatinine down to 0.93.  Previous baseline creatinine 0.68. Previous E. coli urinary tract infection.  Urine analysis sent yesterday is improved. Chronic diastolic congestive heart failure.  Holding Entresto and Lasix.  Last ejection fraction 60 to 65%        Code Status:     Code Status Orders  (From admission, onward)           Start     Ordered   01/13/21 1831  Full code  Continuous        01/13/21 1831           Code Status History     Date Active Date Inactive Code Status Order ID Comments User Context   01/06/2021 1615 01/08/2021 2147 Full Code [id number]  Jacqlyn Krauss, MD ED   03/19/2020 1743 03/20/2020 2110 Full Code [id number]  Ivor Costa, MD Inpatient   03/19/2020 1734 03/19/2020 1743 DNR [id number]  Ivor Costa, MD Inpatient   07/25/2019 0804 07/25/2019 2208 DNR [id number]  Enzo Bi, MD  Inpatient   07/19/2019 1404 07/25/2019 0804 Full Code [id number]  Enzo Bi, MD ED   05/18/2019 1343 05/19/2019 1943 Full Code [id number]  Ivor Costa, MD ED   06/01/2015 0115 06/05/2015 1636 DNR [id number]  Ivor Costa, MD Inpatient   05/31/2015 2157 06/01/2015 0115 Full Code [id number]  Rhona Leavens, RN Inpatient   05/29/2015 2140 05/31/2015 2157 DNR [id number]  Bettey Costa, MD Inpatient      Family Communication: Spoke with grandson on the phone Disposition Plan: Status is: Inpatient  Dispo: The patient is from: Home              Anticipated d/c is to: Physical therapy recommending rehab but family would prefer to take home.              Patient currently being treated for COVID-19 pneumonia and hypotension   Difficult to place patient.  No  Antibiotics: Rocephin, Zithromax and remdesivir  Time spent: 26 minutes  [place]

## 2021-01-16 DIAGNOSIS — N179 Acute kidney failure, unspecified: Secondary | ICD-10-CM | POA: Diagnosis not present

## 2021-01-16 DIAGNOSIS — I959 Hypotension, unspecified: Secondary | ICD-10-CM | POA: Diagnosis not present

## 2021-01-16 DIAGNOSIS — R531 Weakness: Secondary | ICD-10-CM | POA: Diagnosis not present

## 2021-01-16 DIAGNOSIS — U071 COVID-19: Secondary | ICD-10-CM | POA: Diagnosis not present

## 2021-01-16 LAB — GLUCOSE, CAPILLARY: Glucose-Capillary: 125 mg/dL — ABNORMAL HIGH (ref 70–99)

## 2021-01-16 MED ORDER — MIDODRINE HCL 5 MG PO TABS
5.0000 mg | ORAL_TABLET | Freq: Three times a day (TID) | ORAL | Status: DC
  Administered 2021-01-16 – 2021-01-18 (×7): 5 mg via ORAL
  Filled 2021-01-16 (×7): qty 1

## 2021-01-16 MED ORDER — FLORANEX PO PACK
1.0000 g | PACK | Freq: Three times a day (TID) | ORAL | Status: DC
  Administered 2021-01-16 – 2021-01-18 (×6): 1 g via ORAL
  Filled 2021-01-16 (×11): qty 1

## 2021-01-16 NOTE — Progress Notes (Signed)
Patient ID: Taylor Watson, female   DOB: 26-Dec-1918, 87 y.o.   MRN: [id number] [facility] PROGRESS NOTE  Taylor Watson DOB: 1919/04/23 DOA: 01/13/2021 PCP: [name], MD  HPI/Subjective: Patient did not eat much breakfast.  Readmitted with COVID infection and weakness.  Patient this morning did not eat any breakfast while was in there but I was able to give her a sip of the protein drink.  Patient's son states that she ate some lunch.  Objective: Vitals:   01/16/21 1122 01/16/21 1207  BP: 115/79 132/89  Pulse: 85 (!) 108  Resp: 16 16  Temp: 98.1 F (36.7 C) 98.4 F (36.9 C)  SpO2: 99% 100%    Intake/Output Summary (Last 24 hours) at 01/16/2021 1426 Last data filed at 01/15/2021 1500 Gross per 24 hour  Intake 242.02 ml  Output --  Net 242.02 ml   Filed Weights   01/13/21 1716  Weight: 49.6 kg    ROS: Review of Systems  Respiratory:  Positive for shortness of breath.   Cardiovascular:  Negative for chest pain.  Gastrointestinal:  Negative for abdominal pain.  Exam: Physical Exam HENT:     Head: Normocephalic.     Mouth/Throat:     Pharynx: No oropharyngeal exudate.  Eyes:     General: Lids are normal.     Conjunctiva/sclera: Conjunctivae normal.  Neck:     Comments: Large supraclavicular right-sided soft mass. Cardiovascular:     Rate and Rhythm: Normal rate and regular rhythm.     Heart sounds: Normal heart sounds, S1 normal and S2 normal.  Pulmonary:     Breath sounds: Examination of the right-lower field reveals decreased breath sounds. Examination of the left-lower field reveals decreased breath sounds. Decreased breath sounds present. No wheezing, rhonchi or rales.  Abdominal:     Palpations: Abdomen is soft.     Tenderness: There is no abdominal tenderness.  Musculoskeletal:     Right lower leg: No swelling.     Left lower leg: No swelling.  Skin:    General: Skin is warm.     Findings: No rash.  Neurological:      Mental Status: She is alert.     Comments: Answer some yes/no questions.  Able to straight leg raise.      Scheduled Meds:  vitamin C  250 mg Oral Daily   azithromycin  250 mg Oral Daily   dexamethasone  6 mg Oral Daily   enoxaparin (LOVENOX) injection  30 mg Subcutaneous Q24H   feeding supplement  237 mL Oral TID BM   Ipratropium-Albuterol  1 puff Inhalation Q6H   lactobacillus  1 g Oral TID WC   midodrine  5 mg Oral TID WC   multivitamin with minerals  1 tablet Oral Daily   zinc sulfate  220 mg Oral Daily   Continuous Infusions:  cefTRIAXone (ROCEPHIN)  IV 1 g (01/16/21 0014)   remdesivir 100 mg in NS 100 mL 100 mg (01/16/21 0818)    Assessment/Plan:  COVID-19 pneumonia with worsening infiltrates on chest x-ray.  On oxygen supplementation.  Continue remdesivir.  Added 6 mg of Decadron yesterday.  Patient also on empiric Rocephin and Zithromax.  Continue Combivent zinc and vitamin C. Relative hypotension.  Started on low-dose midodrine and gave a 250 bolus yesterday.  Continue to hold Entresto and Lasix at this point. Weakness.  Physical therapy recommending rehab.  Family would like to take her home with home health. Acute kidney injury.  Creatinine  on presentation 1.04.  Baseline creatinine 0.68.  Creatinine yesterday 0.93. Previous E. coli urinary tract infection.  Rocephin would cover.  Repeat urine analysis did not show any signs of infection. Chronic diastolic congestive heart failure.  Holding Entresto and Lasix at this time.  Last EF 60 to 65%.        Code Status:     Code Status Orders  (From admission, onward)           Start     Ordered   01/13/21 1831  Full code  Continuous        01/13/21 1831           Code Status History     Date Active Date Inactive Code Status Order ID Comments User Context   01/06/2021 1615 01/08/2021 2147 Full Code [id number]  Jacqlyn Krauss, MD ED   03/19/2020 1743 03/20/2020 2110 Full Code [id number]  Ivor Costa, MD  Inpatient   03/19/2020 1734 03/19/2020 1743 DNR [id number]  Ivor Costa, MD Inpatient   07/25/2019 0804 07/25/2019 2208 DNR [id number]  Enzo Bi, MD Inpatient   07/19/2019 1404 07/25/2019 0804 Full Code [id number]  Enzo Bi, MD ED   05/18/2019 1343 05/19/2019 1943 Full Code [id number]  Ivor Costa, MD ED   06/01/2015 0115 06/05/2015 1636 DNR [id number]  Ivor Costa, MD Inpatient   05/31/2015 2157 06/01/2015 0115 Full Code [id number]  Rhona Leavens, RN Inpatient   05/29/2015 2140 05/31/2015 2157 DNR [id number]  Bettey Costa, MD Inpatient      Family Communication: Spoke with grandson on the phone Disposition Plan: Status is: Inpatient  Dispo: The patient is from: Home              Anticipated d/c is to: Home with home health              Patient currently being treated for COVID-19 infection and pneumonia.    Difficult to place patient.  No.  Antibiotics: Remdesivir, Rocephin and Zithromax  Time spent: 26 minutes  Taylor Watson [employer]

## 2021-01-16 NOTE — TOC Initial Note (Signed)
Transition of Care [facility]) - Initial/Assessment Note    Patient Details  Name: Taylor Watson MRN: [id number] Date of Birth: 1918/09/21  [facility] CM/SW Contact:    Pete Pelt, RN Phone Number: 01/16/2021, 1:41 PM  Clinical Narrative:   [facility] bed offer still pending.  LM with Tonya.  Patient has continued medical workup for blood pressure and other issues today.  TOC to follow to discharge.                Expected Discharge Plan: Skilled Nursing Facility Barriers to Discharge: Continued Medical Work up   Patient Goals and CMS Choice   CMS Medicare.gov Compare Post Acute Care list provided to:: Patient Represenative (must comment) Choice offered to / list presented to : Adult Children  Expected Discharge Plan and Services Expected Discharge Plan: [place]   Discharge Planning Services: CM Consult Post Acute Care Choice: [place] Living arrangements for the past 2 months: Single Family Home                 DME Arranged: N/A DME Agency: NA       HH Arranged: NA HH Agency: NA Date HH Agency Contacted: 01/14/21 Time [place]: [id number] Representative spoke with at [place]: SNF referral  Prior Living Arrangements/Services Living arrangements for the past 2 months: Single Family Home Lives with:: Adult Children Patient language and need for interpreter reviewed:: Yes Do you feel safe going back to the place where you live?: Yes      Need for Family Participation in Patient Care: Yes (Comment) Care giver support system in place?: Yes (comment) Current home services: DME (rollator, walker, stool) Criminal Activity/Legal Involvement Pertinent to Current Situation/Hospitalization: No - Comment as needed  Activities of Daily Living      Permission Sought/Granted Permission sought to share information with : Case Manager, Family Supports, Other (comment) Permission granted to share information with : Yes,  Verbal Permission Granted  Share Information with NAME: [name] and [name]  Permission granted to share info w AGENCY: [place] granted to share info w Relationship: daughter and grandson     Emotional Assessment       Orientation: : Oriented to Self Alcohol / Substance Use: Not Applicable Psych Involvement: No (comment)  Admission diagnosis:  Dehydration [E86.0] Diarrhea of infectious origin [A09] Hypotension [I95.9] COVID-19 [U07.1] Pneumonia due to COVID-19 virus [U07.1, J12.82] Patient Active Problem List   Diagnosis Date Noted   E. coli UTI 01/14/2021   Hypotension 01/13/2021   AKI (acute kidney injury) ([place]) 01/13/2021   Protein-calorie malnutrition, severe 01/08/2021   Pneumonia due to COVID-19 virus 01/07/2021   Diarrhea 01/06/2021   COVID-19 virus infection 01/06/2021   Hypoglycemia without diagnosis of diabetes mellitus 01/06/2021   COPD (chronic obstructive pulmonary disease) ([place]) 11/03/2020   Aortic atherosclerosis ([place]) 11/02/2020   Hypertensive kidney disease with CKD (chronic kidney disease) stage V ([place]) 11/02/2020   CKD (chronic kidney disease), stage III ([place]) 11/02/2020   Incontinence 11/02/2020   Conjunctivitis 10/03/2020   Discoloration of skin 07/25/2020   Bad odor of urine 05/11/2020   Bradycardia 03/29/2020   Sebaceous cyst 03/29/2020   Weakness 03/29/2020   UTI (urinary tract infection) 03/19/2020   NSTEMI (non-ST elevated myocardial infarction) ([place]) 03/19/2020   Chronic diastolic CHF (congestive heart failure) ([place])    B12 deficiency 10/13/2019   Skin lesion 08/19/2019   Acute exacerbation of CHF (congestive heart failure) ([place]) 07/22/2019  Acute respiratory failure with hypoxia (HCC) 07/22/2019   Leg swelling 07/22/2019   Atrial fibrillation ([place]) 07/22/2019   Fall at home, initial encounter 07/22/2019   Volume overload 07/19/2019   Hypertensive urgency 51/70/0174   Acute metabolic encephalopathy  94/49/6759   Elevated troponin 05/18/2019   Hypokalemia 05/18/2019   URI (upper respiratory infection) 08/26/2018   Headache 07/17/2017   SOB (shortness of breath) 03/28/2016   Near syncope 03/01/2016   Anemia 10/28/2015   Lower GI bleed    Goals of care, counseling/discussion    Blood loss anemia 06/01/2015   GERD (gastroesophageal reflux disease)    Mild depression ([place])    Unsteady gait 02/08/2015   Schwannoma 10/07/2013   Rectal bleeding 04/03/2013   Light headedness 01/19/2013   Loss of weight 10/22/2012   Hypercholesterolemia 10/22/2012   History of CVA (cerebrovascular accident) 10/22/2012   Anxiety 10/22/2012   Essential hypertension, benign 10/22/2012   PCP:  [name], MD Pharmacy:   [facility] [address] [place] [address] Phone: [phone] Fax: [phone]  [id number] [place], [place] - [address] [place] [address] Phone: [phone] Fax: [phone]     Social Determinants of Health (SDOH) Interventions    Readmission Risk Interventions No flowsheet data found.

## 2021-01-17 DIAGNOSIS — N179 Acute kidney failure, unspecified: Secondary | ICD-10-CM | POA: Diagnosis not present

## 2021-01-17 DIAGNOSIS — I959 Hypotension, unspecified: Secondary | ICD-10-CM | POA: Diagnosis not present

## 2021-01-17 DIAGNOSIS — L89222 Pressure ulcer of left hip, stage 2: Secondary | ICD-10-CM

## 2021-01-17 DIAGNOSIS — R531 Weakness: Secondary | ICD-10-CM | POA: Diagnosis not present

## 2021-01-17 DIAGNOSIS — L8990 Pressure ulcer of unspecified site, unspecified stage: Secondary | ICD-10-CM | POA: Insufficient documentation

## 2021-01-17 DIAGNOSIS — U071 COVID-19: Secondary | ICD-10-CM | POA: Diagnosis not present

## 2021-01-17 DIAGNOSIS — E43 Unspecified severe protein-calorie malnutrition: Secondary | ICD-10-CM

## 2021-01-17 LAB — CBC WITH DIFFERENTIAL/PLATELET
Abs Immature Granulocytes: 0.01 10*3/uL (ref 0.00–0.07)
Basophils Absolute: 0 10*3/uL (ref 0.0–0.1)
Basophils Relative: 0 %
Eosinophils Absolute: 0 10*3/uL (ref 0.0–0.5)
Eosinophils Relative: 0 %
HCT: 46.4 % — ABNORMAL HIGH (ref 36.0–46.0)
Hemoglobin: 14.7 g/dL (ref 12.0–15.0)
Immature Granulocytes: 0 %
Lymphocytes Relative: 18 %
Lymphs Abs: 0.5 10*3/uL — ABNORMAL LOW (ref 0.7–4.0)
MCH: 28.4 pg (ref 26.0–34.0)
MCHC: 31.7 g/dL (ref 30.0–36.0)
MCV: 89.6 fL (ref 80.0–100.0)
Monocytes Absolute: 0.2 10*3/uL (ref 0.1–1.0)
Monocytes Relative: 7 %
Neutro Abs: 2.1 10*3/uL (ref 1.7–7.7)
Neutrophils Relative %: 75 %
Platelets: 242 10*3/uL (ref 150–400)
RBC: 5.18 MIL/uL — ABNORMAL HIGH (ref 3.87–5.11)
RDW: 15.1 % (ref 11.5–15.5)
Smear Review: NORMAL
WBC: 2.7 10*3/uL — ABNORMAL LOW (ref 4.0–10.5)
nRBC: 0 % (ref 0.0–0.2)

## 2021-01-17 LAB — BASIC METABOLIC PANEL
Anion gap: 5 (ref 5–15)
BUN: 46 mg/dL — ABNORMAL HIGH (ref 8–23)
CO2: 28 mmol/L (ref 22–32)
Calcium: 8 mg/dL — ABNORMAL LOW (ref 8.9–10.3)
Chloride: 107 mmol/L (ref 98–111)
Creatinine, Ser: 0.93 mg/dL (ref 0.44–1.00)
GFR, Estimated: 55 mL/min — ABNORMAL LOW (ref >60)
Glucose, Bld: 110 mg/dL — ABNORMAL HIGH (ref 70–99)
Potassium: 4.6 mmol/L (ref 3.5–5.1)
Sodium: 140 mmol/L (ref 135–145)

## 2021-01-17 NOTE — Progress Notes (Signed)
Patient ID: Taylor Watson, female   DOB: 30-Jul-1918, 87 y.o.   MRN: [id number] [facility] PROGRESS NOTE  Taylor Watson GMW:[id number] DOB: Nov 24, 1918 DOA: 01/13/2021 PCP: [name], MD  HPI/Subjective: Patient seen this morning sitting in the chair.  Patient ate breakfast today.  Feeling a little bit better today.  Still with a little cough.  Objective: Vitals:   01/17/21 1027 01/17/21 1306  BP: 111/76 116/78  Pulse: 82 67  Resp: 16 16  Temp: 97.7 F (36.5 C) (!) 97.5 F (36.4 C)  SpO2: 97% 95%    Intake/Output Summary (Last 24 hours) at 01/17/2021 1535 Last data filed at 01/16/2021 2050 Gross per 24 hour  Intake 100 ml  Output 800 ml  Net -700 ml   Filed Weights   01/13/21 1716  Weight: 49.6 kg    ROS: Review of Systems  Respiratory:  Positive for cough. Negative for shortness of breath.   Cardiovascular:  Negative for chest pain.  Gastrointestinal:  Negative for abdominal pain, nausea and vomiting.  Exam: Physical Exam HENT:     Head: Normocephalic.     Mouth/Throat:     Pharynx: No oropharyngeal exudate.  Eyes:     General: Lids are normal.     Conjunctiva/sclera: Conjunctivae normal.  Neck:     Comments: Large right supraclavicular mass Cardiovascular:     Rate and Rhythm: Normal rate and regular rhythm.     Heart sounds: Normal heart sounds, S1 normal and S2 normal.  Pulmonary:     Breath sounds: Examination of the right-lower field reveals decreased breath sounds. Examination of the left-lower field reveals decreased breath sounds. Decreased breath sounds present. No wheezing, rhonchi or rales.  Abdominal:     Palpations: Abdomen is soft.     Tenderness: There is no abdominal tenderness.  Musculoskeletal:     Right lower leg: No swelling.     Left lower leg: No swelling.  Skin:    General: Skin is warm.     Findings: No rash.     Comments: Darkened discoloration of fingers and toes  Neurological:     Mental Status: She is alert.      Comments: Answers questions appropriately today.      Scheduled Meds:  vitamin C  250 mg Oral Daily   azithromycin  250 mg Oral Daily   dexamethasone  6 mg Oral Daily   enoxaparin (LOVENOX) injection  30 mg Subcutaneous Q24H   feeding supplement  237 mL Oral TID BM   Ipratropium-Albuterol  1 puff Inhalation Q6H   lactobacillus  1 g Oral TID WC   midodrine  5 mg Oral TID WC   multivitamin with minerals  1 tablet Oral Daily   zinc sulfate  220 mg Oral Daily   Continuous Infusions:  cefTRIAXone (ROCEPHIN)  IV 1 g (01/17/21 0114)    Assessment/Plan:  COVID-19 pneumonia.  Chest x-ray showing worsening infiltrates than previous.  Wears oxygen as needed.  Completed remdesivir 5 days.  On Decadron orally.  On Rocephin and Zithromax also.  Continue zinc and vitamin C. Hypotension on midodrine.  Holding Entresto and Lasix at this point Weakness.  Physical therapy recommending rehab.  Family would prefer to take her home with home health.  Grandson to come in today to assess his grandmother. Acute kidney injury.  Creatinine 1.04 on presentation.  0.93 today.  Baseline creatinine around 0.68. Previous E. coli urinary tract infection.  Repeat urine analysis negative. Chronic diastolic congestive heart failure.  Holding Entresto and Lasix at this time.  Last EF 60 to 65%. Severe malnutrition Nursing staff documented a stage II decubiti on hip area  Pressure Injury 01/16/21 Hip Left;Posterior Stage 2 -  Partial thickness loss of dermis presenting as a shallow open injury with a red, pink wound bed without slough. (Active)  01/16/21 0819  Location: Hip  Location Orientation: Left;Posterior  Staging: Stage 2 -  Partial thickness loss of dermis presenting as a shallow open injury with a red, pink wound bed without slough.  Wound Description (Comments):   Present on Admission:        Code Status:     Code Status Orders  (From admission, onward)           Start     Ordered    01/13/21 1831  Full code  Continuous        01/13/21 1831           Code Status History     Date Active Date Inactive Code Status Order ID Comments User Context   01/06/2021 1615 01/08/2021 2147 Full Code [id number]  Jacqlyn Krauss, MD ED   03/19/2020 1743 03/20/2020 2110 Full Code [id number]  Ivor Costa, MD Inpatient   03/19/2020 1734 03/19/2020 1743 DNR [id number]  Ivor Costa, MD Inpatient   07/25/2019 0804 07/25/2019 2208 DNR [id number]  Enzo Bi, MD Inpatient   07/19/2019 1404 07/25/2019 0804 Full Code [id number]  Enzo Bi, MD ED   05/18/2019 1343 05/19/2019 1943 Full Code [id number]  Ivor Costa, MD ED   06/01/2015 0115 06/05/2015 1636 DNR [id number]  Ivor Costa, MD Inpatient   05/31/2015 2157 06/01/2015 0115 Full Code [id number]  Rhona Leavens, RN Inpatient   05/29/2015 2140 05/31/2015 2157 DNR [id number]  Bettey Costa, MD Inpatient      Family Communication: Spoke with grandson on the phone earlier he will come in and assess the patient on whether he thinks the patient be able to go home. Disposition Plan: Status is: Inpatient  Dispo: The patient is from: Home              Anticipated d/c is to: Likely home with home health              Patient currently completed remdesivir.  1 more day of antibiotics.   Difficult to place patient.  No.  Time spent: 28 minutes  [place]

## 2021-01-17 NOTE — TOC Progression Note (Signed)
Transition of Care [facility]) - Progression Note    Patient Details  Name: Taylor Watson MRN: [id number] Date of Birth: [birth year]  Transition of Care [facility]) CM/SW Contact  Zigmund Daniel Dorian Pod, RN Phone Number:[phone] 01/17/2021, 4:13 PM  Clinical Narrative:    Spoke with pt's son Alveta Heimlich [phone]) about possibly borden the search for SNFs placements. Son indicated pt is eating more today and he may want to consider pt to return home with possible [facility] PT[facility]OT. Pt initially approved via [place] before SNF was recommended and prefers to accept if pt continue to do well instead of SNF. States he assist pt with medications and medical appointments.  Will update team accordingly.  Expected Discharge Plan: [place] Barriers to Discharge: Continued Medical Work up  Expected Discharge Plan and Services Expected Discharge Plan: [place]   Discharge Planning Services: CM Consult Post Acute Care Choice: [place] Living arrangements for the past 2 months: Single Family Home                 DME Arranged: N/A DME Agency: NA       HH Arranged: NA HH Agency: NA Date HH Agency Contacted: 01/14/21 Time [place]: [id number] Representative spoke with at [place]: SNF referral   Social Determinants of Health (SDOH) Interventions    Readmission Risk Interventions No flowsheet data found.

## 2021-01-18 ENCOUNTER — Other Ambulatory Visit: Payer: Self-pay | Admitting: Internal Medicine

## 2021-01-18 DIAGNOSIS — U071 COVID-19: Secondary | ICD-10-CM | POA: Diagnosis not present

## 2021-01-18 DIAGNOSIS — R531 Weakness: Secondary | ICD-10-CM | POA: Diagnosis not present

## 2021-01-18 DIAGNOSIS — N189 Chronic kidney disease, unspecified: Secondary | ICD-10-CM

## 2021-01-18 DIAGNOSIS — E43 Unspecified severe protein-calorie malnutrition: Secondary | ICD-10-CM | POA: Insufficient documentation

## 2021-01-18 DIAGNOSIS — I959 Hypotension, unspecified: Secondary | ICD-10-CM | POA: Diagnosis not present

## 2021-01-18 DIAGNOSIS — N179 Acute kidney failure, unspecified: Secondary | ICD-10-CM | POA: Diagnosis not present

## 2021-01-18 MED ORDER — ASCORBIC ACID 250 MG PO TABS: 250.0000 mg | ORAL_TABLET | Freq: Every day | ORAL | 0 refills | Status: DC

## 2021-01-18 MED ORDER — DEXAMETHASONE 2 MG PO TABS: ORAL_TABLET | ORAL | 0 refills | Status: DC

## 2021-01-18 MED ORDER — FLORANEX PO PACK: 1.0000 g | PACK | Freq: Three times a day (TID) | ORAL | 0 refills | Status: DC

## 2021-01-18 MED ORDER — MIDODRINE HCL 5 MG PO TABS: 5.0000 mg | ORAL_TABLET | Freq: Three times a day (TID) | ORAL | 0 refills | Status: DC

## 2021-01-18 MED ORDER — ENSURE ENLIVE PO LIQD: 237.0000 mL | Freq: Three times a day (TID) | ORAL | 0 refills | Status: DC

## 2021-01-18 MED ORDER — SODIUM CHLORIDE 0.9 % IV SOLN: INTRAVENOUS | Status: DC | PRN

## 2021-01-18 MED ORDER — ZINC SULFATE 220 (50 ZN) MG PO CAPS: 220.0000 mg | ORAL_CAPSULE | Freq: Every day | ORAL | 0 refills | Status: DC

## 2021-01-18 NOTE — TOC Transition Note (Signed)
Transition of Care [facility]) - CM/SW Discharge Note   Patient Details  Name: Taylor Watson MRN: [id number] Date of Birth: [birth year]  Transition of Care [facility]) CM/SW Contact:  Harriet Masson, RN Phone Number:[phone] 01/18/2021, 12:50 PM   Clinical Narrative:    Previously excepted with [facility]. So Taylor Watson declined SNF and request to go with the [facility] for PT/OT. Called Corene Cornea once again with this referral and provider the son Wille Glaser) contact number [phone]. He is aware agency will be calling to set of home visit. No other inquires or questions at this time,=. Pt will be discharged today.   Final next level of care: Home w Home Health Services Barriers to Discharge: Barriers Resolved   Patient Goals and CMS Choice   CMS Medicare.gov Compare Post Acute Care list provided to:: Patient Represenative (must comment) Choice offered to / list presented to : Adult Children  Discharge Placement                  Name of family member notified: Taylor Watson [phone] (son) Patient and family notified of of transfer: 01/18/21  Discharge Plan and Services   Discharge Planning Services: CM Consult Post Acute Care Choice: [place]          DME Arranged: N/A DME Agency: NA       HH Arranged: NA [place] Agency: [place] ([place]) Date [place]: 01/18/21 Time [place]: [place] Representative spoke with at [place]: [employer] ([place]) Interventions     Readmission Risk Interventions No flowsheet data found.

## 2021-01-18 NOTE — Discharge Instructions (Signed)
Stay hydrated.

## 2021-01-18 NOTE — Discharge Summary (Signed)
Taylor Watson at [place] NAME: Taylor Watson    MR#:  [id number]  DATE OF BIRTH:  [birth year]  DATE OF ADMISSION:  01/13/2021 ADMITTING PHYSICIAN: Loletha Grayer, MD  DATE OF DISCHARGE: 01/18/2021  PRIMARY CARE PHYSICIAN: [name], MD    ADMISSION DIAGNOSIS:  Dehydration [E86.0] Diarrhea of infectious origin [A09] Hypotension [I95.9] COVID-19 [U07.1] Pneumonia due to COVID-19 virus [U07.1, J12.82]  DISCHARGE DIAGNOSIS:  Active Problems:   Chronic diastolic CHF (congestive heart failure) ([place])   COVID-19 virus infection   Pneumonia due to COVID-19 virus   Severe malnutrition (HCC)   Hypotension   AKI (acute kidney injury) ([place])   E. coli UTI   Pressure injury of skin   Protein-calorie malnutrition, severe   SECONDARY DIAGNOSIS:   Past Medical History:  Diagnosis Date  . Allergy   . Anemia   . Arrhythmia   . CHF (congestive heart failure) ([place])   . CVA (cerebral vascular accident) ([place])   . Depression   . GERD (gastroesophageal reflux disease)   . History of blood transfusion   . History of kidney stones   . Hx: UTI (urinary tract infection)   . Hypercholesterolemia   . Hypertension     HOSPITAL COURSE:   COVID-19 pneumonia.  Chest x-ray on admission showed worsening infiltrates than previous.  The patient does wear oxygen as needed.  Patient completed 5 days of remdesivir while here in the hospital.  Will give Decadron taper as outpatient.  The patient also completed Rocephin and Zithromax here in the hospital also just in case this was bacterial pneumonia.  Continue zinc and vitamin C.  Encourage eating and drinking and staying hydrated. Hypotension.  I prescribed midodrine here in the hospital.  Holding Entresto and Lasix at this point.  Would recommend following up as outpatient with PMD and check any vital signs prior to restarting any medications.  Not a candidate for [facility] as outpatient. Weakness.  Physical  therapy recommending rehab.  Family would prefer to take her home with home health.  Home health set up. Acute kidney injury on chronic kidney disease stage II.  Creatinine 0.68 at baseline.  When the patient came in creatinine was 1.04.  Creatinine upon discharge 0.93.  Avoid hypotension.  Continue to hold Entresto. Previous E. coli urinary tract infection.  Repeat urine analysis negative.  The Rocephin given on this hospital course would have covered the E. coli. Chronic diastolic congestive heart failure.  Continue to hold Entresto and Lasix at this time.  Last EF 60 to 65% Severe protein calorie malnutrition Stage II decubiti on hip  DISCHARGE CONDITIONS:   [place]  CONSULTS OBTAINED:  None  DRUG ALLERGIES:   Allergies  Allergen Reactions  . Dyazide [Hydrochlorothiazide W-Triamterene] Other (See Comments)    Unknown reaction  . Toprol Xl [Metoprolol Tartrate] Other (See Comments)    unknown  . Monopril [Fosinopril] Cough  . Verapamil Other (See Comments)    headache    DISCHARGE MEDICATIONS:   Allergies as of 01/18/2021       Reactions   Dyazide [hydrochlorothiazide W-triamterene] Other (See Comments)   Unknown reaction   Toprol Xl [metoprolol Tartrate] Other (See Comments)   unknown   Monopril [fosinopril] Cough   Verapamil Other (See Comments)   headache        Medication List     STOP taking these medications    aspirin 81 MG EC tablet   Entresto 49-51 MG Generic drug: sacubitril-valsartan  erythromycin ophthalmic ointment   furosemide 40 MG tablet Commonly known as: Lasix   hydrocortisone 2.5 % cream   hydrocortisone 25 MG suppository Commonly known as: ANUSOL-HC   potassium chloride 10 MEQ tablet Commonly known as: KLOR-CON       TAKE these medications    ascorbic acid 250 MG tablet Commonly known as: VITAMIN C Take 1 tablet (250 mg total) by mouth daily. Start taking on: January 19, 2021   dexamethasone 2 MG tablet Commonly known  as: DECADRON Two tab po daily for two days then one tab po daily for two days Start taking on: January 19, 2021   feeding supplement Liqd Take 237 mLs by mouth 3 (three) times daily between meals.   lactobacillus Pack Take 1 packet (1 g total) by mouth 3 (three) times daily with meals.   midodrine 5 MG tablet Commonly known as: PROAMATINE Take 1 tablet (5 mg total) by mouth 3 (three) times daily with meals.   zinc sulfate 220 (50 Zn) MG capsule Take 1 capsule (220 mg total) by mouth daily. Start taking on: January 19, 2021         DISCHARGE INSTRUCTIONS:   Follow-up PMD 5 days  If you experience worsening of your admission symptoms, develop shortness of breath, life threatening emergency, suicidal or homicidal thoughts you must seek medical attention immediately by calling 911 or calling your MD immediately  if symptoms less severe.  You Must read complete instructions/literature along with all the possible adverse reactions/side effects for all the Medicines you take and that have been prescribed to you. Take any new Medicines after you have completely understood and accept all the possible adverse reactions/side effects.   Please note  You were cared for by a hospitalist during your hospital stay. If you have any questions about your discharge medications or the care you received while you were in the hospital after you are discharged, you can call the unit and asked to speak with the hospitalist on call if the hospitalist that took care of you is not available. Once you are discharged, your primary care physician will handle any further medical issues. Please note that NO REFILLS for any discharge medications will be authorized once you are discharged, as it is imperative that you return to your primary care physician (or establish a relationship with a primary care physician if you do not have one) for your aftercare needs so that they can reassess your need for medications  and monitor your lab values.    Today   CHIEF COMPLAINT:   Brought in secondary to weakness and recent COVID infection.  HISTORY OF PRESENT ILLNESS:  Taylor Watson  is a 87 y.o. female recent COVID infection brought back to the hospital with weakness.   VITAL SIGNS:  Blood pressure 106/67, pulse 75, temperature 97.9 F (36.6 C), resp. rate 15, height 5\' 6"  (1.676 m), weight 49.6 kg, SpO2 93 %.   PHYSICAL EXAMINATION:  GENERAL:  87 y.o.-year-old patient lying in the bed with no acute distress.  EYES: Pupils equal, round, reactive to light and accommodation. No scleral icterus.  HEENT: Head atraumatic, normocephalic. Oropharynx and nasopharynx clear.   LUNGS: Decreased breath sounds bilateral bases, no wheezing, rales,rhonchi or crepitation. No use of accessory muscles of respiration.  CARDIOVASCULAR: S1, S2 normal. No murmurs, rubs, or gallops.  ABDOMEN: Soft, non-tender, non-distended.  EXTREMITIES: No pedal edema.  NEUROLOGIC: Able to straight leg raise PSYCHIATRIC: The patient is alert and answers some simple questions.  DATA REVIEW:   CBC Recent Labs  Lab 01/17/21 0603  WBC 2.7*  HGB 14.7  HCT 46.4*  PLT 242    Chemistries  Recent Labs  Lab 01/13/21 1705 01/15/21 0458 01/17/21 0603  NA 140   < > 140  K 3.8   < > 4.6  CL 104   < > 107  CO2 28   < > 28  GLUCOSE 97   < > 110*  BUN 19   < > 46*  CREATININE 1.04*   < > 0.93  CALCIUM 8.2*   < > 8.0*  AST 34  --   --   ALT 13  --   --   ALKPHOS 63  --   --   BILITOT 0.8  --   --    < > = values in this interval not displayed.    Microbiology Results  Results for orders placed or performed during the hospital encounter of 01/06/21  Resp Panel by RT-PCR (Flu A&B, Covid) Nasopharyngeal Swab     Status: Abnormal   Collection Time: 01/06/21 12:52 PM   Specimen: Nasopharyngeal Swab; Nasopharyngeal(NP) swabs in vial transport medium  Result Value Ref Range Status   SARS Coronavirus 2 by RT PCR POSITIVE  (A) NEGATIVE Final    Comment: RESULT CALLED TO, READ BACK BY AND VERIFIED WITH: KATIE FERGUSON 01/06/21 1352 KLW (NOTE) SARS-CoV-2 target nucleic acids are DETECTED.  The SARS-CoV-2 RNA is generally detectable in upper respiratory specimens during the acute phase of infection. Positive results are indicative of the presence of the identified virus, but do not rule out bacterial infection or co-infection with other pathogens not detected by the test. Clinical correlation with patient history and other diagnostic information is necessary to determine patient infection status. The expected result is Negative.  Fact Sheet for Patients: EntrepreneurPulse.com.au  Fact Sheet for Healthcare Providers: IncredibleEmployment.be  This test is not yet approved or cleared by the [place] FDA and  has been authorized for detection and/or diagnosis of SARS-CoV-2 by FDA under an Emergency Use Authorization (EUA).  This EUA will remain in effect (meaning this test can be [place] ed) for the duration of  the COVID-19 declaration under Section 564(b)(1) of the Act, 21 U.S.C. section 360bbb-3(b)(1), unless the authorization is terminated or revoked sooner.     Influenza A by PCR NEGATIVE NEGATIVE Final   Influenza B by PCR NEGATIVE NEGATIVE Final    Comment: (NOTE) The Xpert Xpress SARS-CoV-2/FLU/RSV plus assay is intended as an aid in the diagnosis of influenza from Nasopharyngeal swab specimens and should not be used as a sole basis for treatment. Nasal washings and aspirates are unacceptable for Xpert Xpress SARS-CoV-2/FLU/RSV testing.  Fact Sheet for Patients: EntrepreneurPulse.com.au  Fact Sheet for Healthcare Providers: IncredibleEmployment.be  This test is not yet approved or cleared by the [place] FDA and has been authorized for detection and/or diagnosis of SARS-CoV-2 by FDA under an Emergency Use  Authorization (EUA). This EUA will remain in effect (meaning this test can be used) for the duration of the COVID-19 declaration under Section 564(b)(1) of the Act, 21 U.S.C. section 360bbb-3(b)(1), unless the authorization is terminated or revoked.  Performed at [facility], [address] [place], [place] [address]   Urine Culture     Status: Abnormal   Collection Time: 01/06/21  2:54 PM   Specimen: Urine, Clean Catch  Result Value Ref Range Status   Specimen Description   Final    URINE,  CLEAN CATCH Performed at [facility], [address], [place], [place] [address]    Special Requests   Final    NONE Performed at [facility], [place], [place] [address]    Culture >=100,000 COLONIES/mL ESCHERICHIA COLI (A)  Final   Report Status 01/08/2021 FINAL  Final   Organism ID, Bacteria ESCHERICHIA COLI (A)  Final      Susceptibility   Escherichia coli - MIC*    AMPICILLIN >=32 RESISTANT Resistant     CEFAZOLIN <=4 SENSITIVE Sensitive     CEFEPIME <=0.12 SENSITIVE Sensitive     CEFTRIAXONE <=0.25 SENSITIVE Sensitive     CIPROFLOXACIN <=0.25 SENSITIVE Sensitive     GENTAMICIN <=1 SENSITIVE Sensitive     IMIPENEM <=0.25 SENSITIVE Sensitive     NITROFURANTOIN <=16 SENSITIVE Sensitive     TRIMETH/SULFA <=20 SENSITIVE Sensitive     AMPICILLIN/SULBACTAM 16 INTERMEDIATE Intermediate     PIP/TAZO <=4 SENSITIVE Sensitive     * >=100,000 COLONIES/mL ESCHERICHIA COLI      Management plans discussed with the patient, family and they are in agreement.  CODE STATUS:     Code Status Orders  (From admission, onward)           Start     Ordered   01/13/21 1831  Full code  Continuous        01/13/21 1831           Code Status History     Date Active Date Inactive Code Status Order ID Comments User Context   01/06/2021 1615 01/08/2021 2147 Full Code [id number]  Jacqlyn Krauss, MD ED   03/19/2020 1743 03/20/2020 2110 Full Code  [id number]  Ivor Costa, MD Inpatient   03/19/2020 1734 03/19/2020 1743 DNR [id number]  Ivor Costa, MD Inpatient   07/25/2019 0804 07/25/2019 2208 DNR [id number]  Enzo Bi, MD Inpatient   07/19/2019 1404 07/25/2019 0804 Full Code [id number]  Enzo Bi, MD ED   05/18/2019 1343 05/19/2019 1943 Full Code [id number]  Ivor Costa, MD ED   06/01/2015 0115 06/05/2015 1636 DNR [id number]  Ivor Costa, MD Inpatient   05/31/2015 2157 06/01/2015 0115 Full Code [id number]  Rhona Leavens, RN Inpatient   05/29/2015 2140 05/31/2015 2157 DNR [id number]  Bettey Costa, MD Inpatient       TOTAL TIME TAKING CARE OF THIS PATIENT: 31 minutes.    Loletha Grayer M.D on 01/18/2021 at 12:52 PM  [facility]  CC: Primary care physician; [name], MD

## 2021-01-19 ENCOUNTER — Telehealth: Payer: Self-pay | Admitting: Internal Medicine

## 2021-01-19 NOTE — Telephone Encounter (Signed)
Kimmey from [facility] is calling to give an FYI that they faxed over order for the patient to have a Education officer, museum and the patients family will be calling in soon to schedule an appointment.

## 2021-01-20 ENCOUNTER — Telehealth: Payer: Self-pay

## 2021-01-20 NOTE — Telephone Encounter (Signed)
Transition Care Management Follow-up Telephone Call Date of discharge and from where: 01/18/21 from [facility] How have you been since you were released from the hospital? HIPAA compliant. Daughter notes patient has been talking confused. And sleeps all of the time. Denies pain, cough, fever. Decrease in appetite. Drinking fluids okay. No BM since home. Urinating without issue. Dry depend since stopping lasix.  Any questions or concerns? No  Items Reviewed: Did the pt receive and understand the discharge instructions provided? Yes  Medications obtained and verified? Yes  Any new allergies since your discharge? No  Dietary orders reviewed? Yes Do you have support at home? Yes   Home Care and Equipment/Supplies: Were home health services ordered? Yes, [facility].  Has the agency set up a time to come to the patient's home? Awaiting follow up call from agency to set schedule.   Functional Questionnaire: (I = Independent and D = Dependent) ADLs: Daughter assist  Maintaining continence- improved since stopping lasix. Depends worn daily.  Transferring/Ambulation- walker  Managing Meds- daughter manages  Follow up appointments reviewed:  PCP Hospital f/u appt confirmed?  No available appointment within specified timeframe on discharge. Awaiting call back for scheduling. Deferred to pcp.  [facility] f/u appt confirmed? None at this time.  Are transportation arrangements needed? No  If their condition worsens, is the pt aware to call PCP or go to the Emergency Dept.? Yes Was the patient provided with contact information for the PCP's office or ED? Yes Was to pt encouraged to call back with questions or concerns? Yes

## 2021-01-21 NOTE — Telephone Encounter (Signed)
Can schedule 01/28/21 - 12:30.

## 2021-01-21 NOTE — Telephone Encounter (Signed)
have not received order

## 2021-01-21 NOTE — Telephone Encounter (Signed)
Called patients daughter. Unable to LM

## 2021-01-22 ENCOUNTER — Other Ambulatory Visit: Payer: Self-pay

## 2021-01-22 ENCOUNTER — Emergency Department
Admission: EM | Admit: 2021-01-22 | Discharge: 2021-01-22 | Disposition: A | Discharge status: Home / Self Care | Payer: Medicare Other | Attending: Student in an Organized Health Care Education/Training Program | Admitting: Student in an Organized Health Care Education/Training Program

## 2021-01-22 ENCOUNTER — Emergency Department: Payer: Medicare Other

## 2021-01-22 DIAGNOSIS — N185 Chronic kidney disease, stage 5: Secondary | ICD-10-CM | POA: Insufficient documentation

## 2021-01-22 DIAGNOSIS — R4182 Altered mental status, unspecified: Secondary | ICD-10-CM | POA: Diagnosis not present

## 2021-01-22 DIAGNOSIS — R404 Transient alteration of awareness: Secondary | ICD-10-CM | POA: Diagnosis not present

## 2021-01-22 DIAGNOSIS — I132 Hypertensive heart and chronic kidney disease with heart failure and with stage 5 chronic kidney disease, or end stage renal disease: Secondary | ICD-10-CM | POA: Insufficient documentation

## 2021-01-22 DIAGNOSIS — Z8616 Personal history of COVID-19: Secondary | ICD-10-CM | POA: Diagnosis not present

## 2021-01-22 DIAGNOSIS — Z0000 Encounter for general adult medical examination without abnormal findings: Secondary | ICD-10-CM | POA: Diagnosis not present

## 2021-01-22 DIAGNOSIS — R0902 Hypoxemia: Secondary | ICD-10-CM | POA: Diagnosis not present

## 2021-01-22 DIAGNOSIS — I5032 Chronic diastolic (congestive) heart failure: Secondary | ICD-10-CM | POA: Diagnosis not present

## 2021-01-22 DIAGNOSIS — J449 Chronic obstructive pulmonary disease, unspecified: Secondary | ICD-10-CM | POA: Insufficient documentation

## 2021-01-22 DIAGNOSIS — Z79899 Other long term (current) drug therapy: Secondary | ICD-10-CM | POA: Diagnosis not present

## 2021-01-22 DIAGNOSIS — R6889 Other general symptoms and signs: Secondary | ICD-10-CM | POA: Diagnosis not present

## 2021-01-22 DIAGNOSIS — Z87891 Personal history of nicotine dependence: Secondary | ICD-10-CM | POA: Diagnosis not present

## 2021-01-22 DIAGNOSIS — Z743 Need for continuous supervision: Secondary | ICD-10-CM | POA: Diagnosis not present

## 2021-01-22 DIAGNOSIS — Z139 Encounter for screening, unspecified: Secondary | ICD-10-CM

## 2021-01-22 DIAGNOSIS — I517 Cardiomegaly: Secondary | ICD-10-CM | POA: Diagnosis not present

## 2021-01-22 DIAGNOSIS — J90 Pleural effusion, not elsewhere classified: Secondary | ICD-10-CM | POA: Diagnosis not present

## 2021-01-22 LAB — COMPREHENSIVE METABOLIC PANEL
ALT: 15 U/L (ref 0–44)
AST: 24 U/L (ref 15–41)
Albumin: 3.2 g/dL — ABNORMAL LOW (ref 3.5–5.0)
Alkaline Phosphatase: 57 U/L (ref 38–126)
Anion gap: 5 (ref 5–15)
BUN: 33 mg/dL — ABNORMAL HIGH (ref 8–23)
CO2: 29 mmol/L (ref 22–32)
Calcium: 8.9 mg/dL (ref 8.9–10.3)
Chloride: 102 mmol/L (ref 98–111)
Creatinine, Ser: 0.69 mg/dL (ref 0.44–1.00)
GFR, Estimated: >60 mL/min (ref >60)
Glucose, Bld: 84 mg/dL (ref 70–99)
Potassium: 4.9 mmol/L (ref 3.5–5.1)
Sodium: 136 mmol/L (ref 135–145)
Total Bilirubin: 1.1 mg/dL (ref 0.3–1.2)
Total Protein: 6.4 g/dL — ABNORMAL LOW (ref 6.5–8.1)

## 2021-01-22 LAB — CBC
HCT: 47.8 % — ABNORMAL HIGH (ref 36.0–46.0)
Hemoglobin: 15.4 g/dL — ABNORMAL HIGH (ref 12.0–15.0)
MCH: 29.1 pg (ref 26.0–34.0)
MCHC: 32.2 g/dL (ref 30.0–36.0)
MCV: 90.2 fL (ref 80.0–100.0)
Platelets: 204 10*3/uL (ref 150–400)
RBC: 5.3 MIL/uL — ABNORMAL HIGH (ref 3.87–5.11)
RDW: 15 % (ref 11.5–15.5)
WBC: 4.2 10*3/uL (ref 4.0–10.5)
nRBC: 0 % (ref 0.0–0.2)

## 2021-01-22 LAB — URINALYSIS, COMPLETE (UACMP) WITH MICROSCOPIC
Bilirubin Urine: NEGATIVE
Glucose, UA: NEGATIVE mg/dL
Hgb urine dipstick: NEGATIVE
Ketones, ur: NEGATIVE mg/dL
Leukocytes,Ua: NEGATIVE
Nitrite: NEGATIVE
Protein, ur: NEGATIVE mg/dL
Specific Gravity, Urine: 1.016 (ref 1.005–1.030)
pH: 5 (ref 5.0–8.0)

## 2021-01-22 NOTE — ED Triage Notes (Signed)
Pt comes into the ED via EMS from home with c/o from family the pts mouth look funny, pt has no complaints , a/ox2.  164/82 83HR 100%RA CBG89

## 2021-01-22 NOTE — Discharge Instructions (Signed)
Use Tylenol for pain and fevers.  Up to 1000 mg per dose, up to 4 times per day.  Do not take more than 4000 mg of Tylenol/acetaminophen within 24 hours..  

## 2021-01-22 NOTE — ED Provider Notes (Signed)
[facility] Emergency Department Provider Note ____________________________________________   Event Date/Time   First MD Initiated Contact with Patient 01/22/21 1454     (approximate)  I have reviewed the triage vital signs and the nursing notes.  HISTORY  Chief Complaint Altered Mental Status   HPI Taylor Watson is a 87 y.o. femalewho presents to the ED for evaluation of altered mentation.   Chart review indicates multiple recent medical admissions, including 9/13-9/18, discharging home with family.  Admitted for dehydration and hypotension in the setting of diarrhea and COVID-19.  Full code.   Harrel Carina, brings patient to the ED for evaluation of about 24 hours of decreased mentation.  He saw the patient yesterday and she seemed okay then, talkative and interacting with family.  She can typically ambulate 10-15 steps with a walker independently.  Today, she has been more confused, not eating and not acting herself, less interactive.  Apparently did not take her midodrine today.  She seemed to have difficulty getting her words out and talking/conversing as she typically does.  No known falls or trauma, no fevers, no emesis, no increased cough, no diarrhea.   Past Medical History:  Diagnosis Date   Allergy    Anemia    Arrhythmia    CHF (congestive heart failure) ([place])    CVA (cerebral vascular accident) ([place])    Depression    GERD (gastroesophageal reflux disease)    History of blood transfusion    History of kidney stones    Hx: UTI (urinary tract infection)    Hypercholesterolemia    Hypertension     Patient Active Problem List   Diagnosis Date Noted   Protein-calorie malnutrition, severe 01/18/2021   Pressure injury of skin 01/17/2021   E. coli UTI 01/14/2021   Hypotension 01/13/2021   Acute kidney injury superimposed on CKD ([place]) 01/13/2021   Severe malnutrition ([place]) 01/08/2021   Pneumonia due to COVID-19 virus 01/07/2021    Diarrhea 01/06/2021   COVID-19 virus infection 01/06/2021   Hypoglycemia without diagnosis of diabetes mellitus 01/06/2021   COPD (chronic obstructive pulmonary disease) ([place]) 11/03/2020   Aortic atherosclerosis ([place]) 11/02/2020   Hypertensive kidney disease with CKD (chronic kidney disease) stage V ([place]) 11/02/2020   CKD (chronic kidney disease), stage III ([place]) 11/02/2020   Incontinence 11/02/2020   Conjunctivitis 10/03/2020   Discoloration of skin 07/25/2020   Bad odor of urine 05/11/2020   Bradycardia 03/29/2020   Sebaceous cyst 03/29/2020   Weakness 03/29/2020   UTI (urinary tract infection) 03/19/2020   NSTEMI (non-ST elevated myocardial infarction) ([place]) 03/19/2020   Chronic diastolic CHF (congestive heart failure) ([place])    B12 deficiency 10/13/2019   Skin lesion 08/19/2019   Acute exacerbation of CHF (congestive heart failure) ([place]) 07/22/2019   Acute respiratory failure with hypoxia ([place]) 07/22/2019   Leg swelling 07/22/2019   Atrial fibrillation ([place]) 07/22/2019   Fall at home, initial encounter 07/22/2019   Volume overload 07/19/2019   Hypertensive urgency 85/27/7824   Acute metabolic encephalopathy 23/53/6144   Elevated troponin 05/18/2019   Hypokalemia 05/18/2019   URI (upper respiratory infection) 08/26/2018   Headache 07/17/2017   SOB (shortness of breath) 03/28/2016   Near syncope 03/01/2016   Anemia 10/28/2015   Lower GI bleed    Goals of care, counseling/discussion    Blood loss anemia 06/01/2015   GERD (gastroesophageal reflux disease)    Mild depression ([place])    Unsteady gait 02/08/2015   Schwannoma 10/07/2013   Rectal bleeding  04/03/2013   Light headedness 01/19/2013   Loss of weight 10/22/2012   Hypercholesterolemia 10/22/2012   History of CVA (cerebrovascular accident) 10/22/2012   Anxiety 10/22/2012   Essential hypertension, benign 10/22/2012    Past Surgical History:  Procedure Laterality Date   CHOLECYSTECTOMY  1984   NECK LESION BIOPSY   2015   Followed by Dr. Richardson Landry    Prior to Admission medications   Medication Sig Start Date End Date Taking? Authorizing Provider  ascorbic acid (VITAMIN C) 250 MG tablet Take 1 tablet (250 mg total) by mouth daily. 01/19/21   Loletha Grayer, MD  dexamethasone (DECADRON) 2 MG tablet Two tab po daily for two days then one tab po daily for two days 01/19/21   Loletha Grayer, MD  feeding supplement (ENSURE ENLIVE / ENSURE PLUS) LIQD Take 237 mLs by mouth 3 (three) times daily between meals. 01/18/21   Loletha Grayer, MD  lactobacillus (FLORANEX/LACTINEX) PACK Take 1 packet (1 g total) by mouth 3 (three) times daily with meals. 01/18/21   Loletha Grayer, MD  midodrine (PROAMATINE) 5 MG tablet Take 1 tablet (5 mg total) by mouth 3 (three) times daily with meals. 01/18/21   Loletha Grayer, MD  zinc sulfate 220 (50 Zn) MG capsule Take 1 capsule (220 mg total) by mouth daily. 01/19/21   Loletha Grayer, MD    Allergies Dyazide [hydrochlorothiazide w-triamterene], Toprol xl [metoprolol tartrate], Monopril [fosinopril], and Verapamil  Family History  Problem Relation Age of Onset   Cancer Mother        unknown type   Hypertension Mother    Cancer Father        unknown type   Cancer Daughter    Colon cancer Other        grandfather    Social History Social History   Tobacco Use   Smoking status: Former   Smokeless tobacco: Former    Types: Chew  Substance Use Topics   Alcohol use: No    Alcohol/week: 0.0 standard drinks   Drug use: No    Review of Systems  Unable to be accurately assessed due to patient's altered mentation ____________________________________________   PHYSICAL EXAM:  VITAL SIGNS: Vitals:   01/22/21 1347 01/22/21 1659  BP: (!) 144/100 (!) 146/79  Pulse: 80 77  Resp: 17 16  Temp:  98.9 F (37.2 C)  SpO2: 95% 95%    Constitutional: Alert.  Follows commands in all 4 extremities.  Independently sits up cycle about her back.  Does recognize her  grandson Ronalee Belts.  Cachectic and thin Eyes: Conjunctivae are normal. PERRL. EOMI. Head: Atraumatic. Nose: No congestion/rhinnorhea. Mouth/Throat: Mucous membranes are dry.  Oropharynx non-erythematous. Neck: No stridor. No cervical spine tenderness to palpation. Cardiovascular: Normal rate, regular rhythm. Grossly normal heart sounds.  Good peripheral circulation. Respiratory: Normal respiratory effort.  No retractions. Lungs CTAB. Gastrointestinal: Soft , nondistended, nontender to palpation. No CVA tenderness.  Soft and benign throughout. Musculoskeletal: No lower extremity tenderness nor edema.  No joint effusions. No signs of acute trauma. Neurologic:  No gross focal neurologic deficits are appreciated.  Cranial nerves II through XII intact 5/5 strength and sensation in all 4 extremities Initially, upon waking the patient, she does have some mild dysarthria and slurring her speech.  Towards the end of my evaluation, as that she is more awake, she is speaking more fluently without dysarthria. Skin:  Skin is warm, dry and intact. No rash noted. Psychiatric: Mood and affect are normal.  ____________________________________________   LABS (  all labs ordered are listed, but only abnormal results are displayed)  Labs Reviewed  COMPREHENSIVE METABOLIC PANEL - Abnormal; Notable for the following components:      Result Value   BUN 33 (*)    Total Protein 6.4 (*)    Albumin 3.2 (*)    All other components within normal limits  CBC - Abnormal; Notable for the following components:   RBC 5.30 (*)    Hemoglobin 15.4 (*)    HCT 47.8 (*)    All other components within normal limits  URINALYSIS, COMPLETE (UACMP) WITH MICROSCOPIC - Abnormal; Notable for the following components:   Color, Urine YELLOW (*)    APPearance HAZY (*)    Bacteria, UA RARE (*)    All other components within normal limits  CBG MONITORING, ED   ____________________________________________  12 Lead  EKG   ____________________________________________  RADIOLOGY  ED MD interpretation: CT head reviewed by me without evidence of acute intracranial pathology.  Official radiology report(s): DG Chest 2 View  Result Date: 01/22/2021 CLINICAL DATA:  Recent COVID infection, altered mental status EXAM: CHEST - 2 VIEW COMPARISON:  Chest radiograph 01/13/2021 FINDINGS: The cardiomediastinal silhouette is grossly stable, with unchanged cardiomegaly and marked tortuosity of the densely calcified thoracic aorta. The lungs are mildly hyperinflated and hyperlucent raising suspicion for underlying COPD. Aeration of the lung bases has improved since 01/13/2021. There is no new focal airspace disease. There is no overt pulmonary edema. The left pleural effusion seen on the prior study has decreased/resolved. There is no significant right effusion. There is no pneumothorax. There is no acute osseous abnormality. IMPRESSION: Improved aeration of the lung bases with resolved left pleural effusion. No new focal airspace disease. Electronically Signed   By: Valetta Mole M.D.   On: 01/22/2021 16:37   CT HEAD WO CONTRAST (5MM)  Result Date: 01/22/2021 CLINICAL DATA:  One 87 year old female presents with mental status changes of unknown cause. EXAM: CT HEAD WITHOUT CONTRAST TECHNIQUE: Contiguous axial images were obtained from the base of the skull through the vertex without intravenous contrast. COMPARISON:  March 19, 2020. FINDINGS: Brain: No evidence of acute infarction, hemorrhage, hydrocephalus, extra-axial collection or mass lesion/mass effect. Signs of extensive atrophy and with evidence of chronic microvascular ischemic change as before. Vascular: No hyperdense vessel or unexpected calcification. Skull: Normal. Negative for fracture or focal lesion. 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 Sinuses/Orbits: Scattered opacification of ethmoid sinuses. No acute findings relative to  visualized orbits. Sinuses are incompletely imaged. Other: None IMPRESSION: No acute intracranial abnormality. Signs of extensive atrophy and with evidence of chronic microvascular ischemic change as before. Scattered opacification of ethmoid sinuses raising the question of mild sinus disease. No air-fluid levels in the sinuses. Sinuses are incompletely imaged. Electronically Signed   By: Zetta Bills M.D.   On: 01/22/2021 14:50    ____________________________________________   PROCEDURES and INTERVENTIONS  Procedure(s) performed (including Critical Care):  Procedures  Medications - No data to display  ____________________________________________   MDM / ED COURSE   87 year old female presents to the ED from home due to lesser responsiveness today, now improving and clearing, without evidence of acute medical pathology, and amenable to return home.  Normal vitals.  Exam generally reassuring without evidence of neurologic or vascular deficits.  No signs of acute trauma.  She is a little more sleepy and dysarthric when I first wake her up, but she stays awake throughout my evaluation and is much more  clear and without dysarthria by the end of this evaluation.  CT head without evidence of acute intracranial pathology.  No signs of CAP or serum pathology.  No signs of UTI.  CXR is clearing.  No barriers to continued outpatient management.  We will discharge with return precautions.  Clinical Course as of 01/22/21 1809  Thu Jan 22, 2021  1536 Discussed plan of care with Ronalee Belts.  He is in agreement with blood work, urinalysis and chest x-ray.  He is currently filling out advanced directives for the patient, and reports that she would not want chest compressions or ventilator.  We discussed DNR status, he agrees to this.  Does report that she would want medications, fluids or antibiotics if necessary. [DS]  3668 Reassessed.  Awake and looks well.  I explained reassuring work-up to [facility].  He  expresses reassurance and that she seems better now.  We discussed return precautions. [DS]    Clinical Course User Index [DS] Vladimir Crofts, MD    ____________________________________________   FINAL CLINICAL IMPRESSION(S) / ED DIAGNOSES  Final diagnoses:  Altered mental status, unspecified altered mental status type  Encounter for medical screening examination     ED Discharge Orders     None        Trent Gabler Tamala Julian   Note:  This document was prepared using Dragon voice recognition software and may include unintentional dictation errors.    Vladimir Crofts, MD 01/22/21 [phone]

## 2021-01-26 ENCOUNTER — Emergency Department: Payer: Medicare Other

## 2021-01-26 ENCOUNTER — Telehealth: Payer: Self-pay | Admitting: Internal Medicine

## 2021-01-26 ENCOUNTER — Inpatient Hospital Stay
Admission: EM | Admit: 2021-01-26 | Discharge: 2021-01-29 | DRG: 177 | Disposition: A | Discharge status: Hospice | Payer: Medicare Other | Attending: Family Medicine | Admitting: Family Medicine

## 2021-01-26 DIAGNOSIS — I4892 Unspecified atrial flutter: Secondary | ICD-10-CM | POA: Diagnosis present

## 2021-01-26 DIAGNOSIS — G9349 Other encephalopathy: Secondary | ICD-10-CM | POA: Diagnosis not present

## 2021-01-26 DIAGNOSIS — Z7189 Other specified counseling: Secondary | ICD-10-CM | POA: Diagnosis not present

## 2021-01-26 DIAGNOSIS — W19XXXA Unspecified fall, initial encounter: Secondary | ICD-10-CM | POA: Diagnosis not present

## 2021-01-26 DIAGNOSIS — J439 Emphysema, unspecified: Secondary | ICD-10-CM | POA: Diagnosis not present

## 2021-01-26 DIAGNOSIS — I480 Paroxysmal atrial fibrillation: Secondary | ICD-10-CM | POA: Diagnosis present

## 2021-01-26 DIAGNOSIS — I110 Hypertensive heart disease with heart failure: Secondary | ICD-10-CM | POA: Diagnosis present

## 2021-01-26 DIAGNOSIS — E7800 Pure hypercholesterolemia, unspecified: Secondary | ICD-10-CM | POA: Diagnosis not present

## 2021-01-26 DIAGNOSIS — Z8673 Personal history of transient ischemic attack (TIA), and cerebral infarction without residual deficits: Secondary | ICD-10-CM | POA: Diagnosis not present

## 2021-01-26 DIAGNOSIS — I499 Cardiac arrhythmia, unspecified: Secondary | ICD-10-CM | POA: Diagnosis not present

## 2021-01-26 DIAGNOSIS — J449 Chronic obstructive pulmonary disease, unspecified: Secondary | ICD-10-CM | POA: Diagnosis present

## 2021-01-26 DIAGNOSIS — R64 Cachexia: Secondary | ICD-10-CM | POA: Diagnosis not present

## 2021-01-26 DIAGNOSIS — Z515 Encounter for palliative care: Secondary | ICD-10-CM | POA: Diagnosis not present

## 2021-01-26 DIAGNOSIS — R7989 Other specified abnormal findings of blood chemistry: Secondary | ICD-10-CM

## 2021-01-26 DIAGNOSIS — E43 Unspecified severe protein-calorie malnutrition: Secondary | ICD-10-CM | POA: Diagnosis present

## 2021-01-26 DIAGNOSIS — I959 Hypotension, unspecified: Secondary | ICD-10-CM | POA: Diagnosis not present

## 2021-01-26 DIAGNOSIS — Z743 Need for continuous supervision: Secondary | ICD-10-CM | POA: Diagnosis not present

## 2021-01-26 DIAGNOSIS — Z8249 Family history of ischemic heart disease and other diseases of the circulatory system: Secondary | ICD-10-CM

## 2021-01-26 DIAGNOSIS — F0390 Unspecified dementia without behavioral disturbance: Secondary | ICD-10-CM | POA: Diagnosis present

## 2021-01-26 DIAGNOSIS — Z789 Other specified health status: Secondary | ICD-10-CM

## 2021-01-26 DIAGNOSIS — J1282 Pneumonia due to coronavirus disease 2019: Secondary | ICD-10-CM | POA: Diagnosis present

## 2021-01-26 DIAGNOSIS — Z66 Do not resuscitate: Secondary | ICD-10-CM | POA: Diagnosis present

## 2021-01-26 DIAGNOSIS — J690 Pneumonitis due to inhalation of food and vomit: Secondary | ICD-10-CM | POA: Diagnosis present

## 2021-01-26 DIAGNOSIS — G9341 Metabolic encephalopathy: Secondary | ICD-10-CM | POA: Diagnosis not present

## 2021-01-26 DIAGNOSIS — Z28310 Unvaccinated for covid-19: Secondary | ICD-10-CM

## 2021-01-26 DIAGNOSIS — Z87891 Personal history of nicotine dependence: Secondary | ICD-10-CM | POA: Diagnosis not present

## 2021-01-26 DIAGNOSIS — R069 Unspecified abnormalities of breathing: Secondary | ICD-10-CM | POA: Diagnosis not present

## 2021-01-26 DIAGNOSIS — R627 Adult failure to thrive: Secondary | ICD-10-CM | POA: Diagnosis present

## 2021-01-26 DIAGNOSIS — I5032 Chronic diastolic (congestive) heart failure: Secondary | ICD-10-CM | POA: Diagnosis not present

## 2021-01-26 DIAGNOSIS — R6889 Other general symptoms and signs: Secondary | ICD-10-CM | POA: Diagnosis not present

## 2021-01-26 DIAGNOSIS — Z800 Family history of malignant neoplasm of digestive organs: Secondary | ICD-10-CM | POA: Diagnosis not present

## 2021-01-26 DIAGNOSIS — R0602 Shortness of breath: Secondary | ICD-10-CM | POA: Diagnosis not present

## 2021-01-26 DIAGNOSIS — R197 Diarrhea, unspecified: Secondary | ICD-10-CM | POA: Diagnosis present

## 2021-01-26 DIAGNOSIS — Z888 Allergy status to other drugs, medicaments and biological substances status: Secondary | ICD-10-CM

## 2021-01-26 DIAGNOSIS — J9601 Acute respiratory failure with hypoxia: Secondary | ICD-10-CM | POA: Diagnosis present

## 2021-01-26 DIAGNOSIS — R0689 Other abnormalities of breathing: Secondary | ICD-10-CM | POA: Diagnosis not present

## 2021-01-26 DIAGNOSIS — Z7401 Bed confinement status: Secondary | ICD-10-CM | POA: Diagnosis not present

## 2021-01-26 DIAGNOSIS — K219 Gastro-esophageal reflux disease without esophagitis: Secondary | ICD-10-CM | POA: Diagnosis present

## 2021-01-26 DIAGNOSIS — R404 Transient alteration of awareness: Secondary | ICD-10-CM | POA: Diagnosis not present

## 2021-01-26 DIAGNOSIS — Z79899 Other long term (current) drug therapy: Secondary | ICD-10-CM

## 2021-01-26 DIAGNOSIS — I517 Cardiomegaly: Secondary | ICD-10-CM | POA: Diagnosis not present

## 2021-01-26 DIAGNOSIS — U071 COVID-19: Secondary | ICD-10-CM | POA: Diagnosis not present

## 2021-01-26 DIAGNOSIS — R439 Unspecified disturbances of smell and taste: Secondary | ICD-10-CM | POA: Diagnosis present

## 2021-01-26 LAB — CBC WITH DIFFERENTIAL/PLATELET
Abs Immature Granulocytes: 0.02 10*3/uL (ref 0.00–0.07)
Basophils Absolute: 0 10*3/uL (ref 0.0–0.1)
Basophils Relative: 0 %
Eosinophils Absolute: 0 10*3/uL (ref 0.0–0.5)
Eosinophils Relative: 0 %
HCT: 43.9 % (ref 36.0–46.0)
Hemoglobin: 14.4 g/dL (ref 12.0–15.0)
Immature Granulocytes: 0 %
Lymphocytes Relative: 8 %
Lymphs Abs: 0.4 10*3/uL — ABNORMAL LOW (ref 0.7–4.0)
MCH: 29.6 pg (ref 26.0–34.0)
MCHC: 32.8 g/dL (ref 30.0–36.0)
MCV: 90.3 fL (ref 80.0–100.0)
Monocytes Absolute: 0.4 10*3/uL (ref 0.1–1.0)
Monocytes Relative: 8 %
Neutro Abs: 4.3 10*3/uL (ref 1.7–7.7)
Neutrophils Relative %: 84 %
Platelets: 158 10*3/uL (ref 150–400)
RBC: 4.86 MIL/uL (ref 3.87–5.11)
RDW: 15.5 % (ref 11.5–15.5)
WBC: 5.2 10*3/uL (ref 4.0–10.5)
nRBC: 0 % (ref 0.0–0.2)

## 2021-01-26 LAB — COMPREHENSIVE METABOLIC PANEL
ALT: 15 U/L (ref 0–44)
AST: 28 U/L (ref 15–41)
Albumin: 2.4 g/dL — ABNORMAL LOW (ref 3.5–5.0)
Alkaline Phosphatase: 56 U/L (ref 38–126)
Anion gap: 5 (ref 5–15)
BUN: 18 mg/dL (ref 8–23)
CO2: 25 mmol/L (ref 22–32)
Calcium: 6.7 mg/dL — ABNORMAL LOW (ref 8.9–10.3)
Chloride: 110 mmol/L (ref 98–111)
Creatinine, Ser: 0.69 mg/dL (ref 0.44–1.00)
GFR, Estimated: >60 mL/min (ref >60)
Glucose, Bld: 70 mg/dL (ref 70–99)
Potassium: 4.2 mmol/L (ref 3.5–5.1)
Sodium: 140 mmol/L (ref 135–145)
Total Bilirubin: 1.1 mg/dL (ref 0.3–1.2)
Total Protein: 4.8 g/dL — ABNORMAL LOW (ref 6.5–8.1)

## 2021-01-26 LAB — PROCALCITONIN: Procalcitonin: <0.1 ng/mL

## 2021-01-26 LAB — BRAIN NATRIURETIC PEPTIDE: B Natriuretic Peptide: 519.3 pg/mL — ABNORMAL HIGH (ref 0.0–100.0)

## 2021-01-26 LAB — TROPONIN I (HIGH SENSITIVITY): Troponin I (High Sensitivity): 15 ng/L (ref <18)

## 2021-01-26 MED ORDER — GUAIFENESIN ER 600 MG PO TB12
600.0000 mg | ORAL_TABLET | Freq: Two times a day (BID) | ORAL | Status: DC
  Administered 2021-01-28: 600 mg via ORAL
  Filled 2021-01-26 (×2): qty 1

## 2021-01-26 MED ORDER — SODIUM CHLORIDE 0.9 % IV SOLN
3.0000 g | Freq: Once | INTRAVENOUS | Status: AC
  Administered 2021-01-26: 3 g via INTRAVENOUS
  Filled 2021-01-26: qty 8

## 2021-01-26 MED ORDER — TRAZODONE HCL 50 MG PO TABS: 25.0000 mg | ORAL_TABLET | Freq: Every evening | ORAL | Status: DC | PRN

## 2021-01-26 MED ORDER — SODIUM CHLORIDE 0.9 % IV SOLN
500.0000 mg | INTRAVENOUS | Status: DC
  Administered 2021-01-26 – 2021-01-28 (×2): 500 mg via INTRAVENOUS
  Filled 2021-01-26 (×3): qty 500

## 2021-01-26 MED ORDER — ONDANSETRON HCL 4 MG/2ML IJ SOLN: 4.0000 mg | Freq: Four times a day (QID) | INTRAMUSCULAR | Status: DC | PRN

## 2021-01-26 MED ORDER — IOHEXOL 350 MG/ML SOLN
75.0000 mL | Freq: Once | INTRAVENOUS | Status: AC | PRN
  Administered 2021-01-26: 75 mL via INTRAVENOUS

## 2021-01-26 MED ORDER — SODIUM CHLORIDE 0.9 % IV SOLN: INTRAVENOUS | Status: DC

## 2021-01-26 MED ORDER — AMPICILLIN-SULBACTAM SODIUM 3 (2-1) G IJ SOLR
3.0000 g | Freq: Two times a day (BID) | INTRAMUSCULAR | Status: DC
  Administered 2021-01-27 – 2021-01-28 (×3): 3 g via INTRAVENOUS
  Filled 2021-01-26: qty 8
  Filled 2021-01-26: qty 3
  Filled 2021-01-26 (×3): qty 8

## 2021-01-26 MED ORDER — ZINC SULFATE 220 (50 ZN) MG PO CAPS
220.0000 mg | ORAL_CAPSULE | Freq: Every day | ORAL | Status: DC
  Administered 2021-01-28: 220 mg via ORAL
  Filled 2021-01-26: qty 1

## 2021-01-26 MED ORDER — HYDROCOD POLST-CPM POLST ER 10-8 MG/5ML PO SUER: 5.0000 mL | Freq: Two times a day (BID) | ORAL | Status: DC | PRN

## 2021-01-26 MED ORDER — SODIUM CHLORIDE 0.9 % IV SOLN: 3.0000 g | Freq: Four times a day (QID) | INTRAVENOUS | Status: DC

## 2021-01-26 MED ORDER — ENOXAPARIN SODIUM 30 MG/0.3ML IJ SOSY
30.0000 mg | PREFILLED_SYRINGE | INTRAMUSCULAR | Status: DC
  Administered 2021-01-27 (×2): 30 mg via SUBCUTANEOUS
  Filled 2021-01-26 (×2): qty 0.3

## 2021-01-26 MED ORDER — ASCORBIC ACID 500 MG PO TABS
250.0000 mg | ORAL_TABLET | Freq: Every day | ORAL | Status: DC
  Administered 2021-01-28: 250 mg via ORAL
  Filled 2021-01-26: qty 1

## 2021-01-26 MED ORDER — MIDODRINE HCL 5 MG PO TABS
5.0000 mg | ORAL_TABLET | Freq: Three times a day (TID) | ORAL | Status: DC
  Administered 2021-01-27 – 2021-01-28 (×3): 5 mg via ORAL
  Filled 2021-01-26 (×4): qty 1

## 2021-01-26 MED ORDER — ONDANSETRON HCL 4 MG PO TABS: 4.0000 mg | ORAL_TABLET | Freq: Four times a day (QID) | ORAL | Status: DC | PRN

## 2021-01-26 MED ORDER — IPRATROPIUM-ALBUTEROL 0.5-2.5 (3) MG/3ML IN SOLN
3.0000 mL | Freq: Four times a day (QID) | RESPIRATORY_TRACT | Status: DC
  Administered 2021-01-27 – 2021-01-28 (×4): 3 mL via RESPIRATORY_TRACT
  Filled 2021-01-26 (×3): qty 3

## 2021-01-26 MED ORDER — FLORANEX PO PACK
1.0000 g | PACK | Freq: Three times a day (TID) | ORAL | Status: DC
  Administered 2021-01-28: 1 g via ORAL
  Filled 2021-01-26 (×7): qty 1

## 2021-01-26 MED ORDER — ACETAMINOPHEN 650 MG RE SUPP: 650.0000 mg | Freq: Four times a day (QID) | RECTAL | Status: DC | PRN

## 2021-01-26 MED ORDER — ACETAMINOPHEN 325 MG PO TABS: 650.0000 mg | ORAL_TABLET | Freq: Four times a day (QID) | ORAL | Status: DC | PRN

## 2021-01-26 MED ORDER — MAGNESIUM HYDROXIDE 400 MG/5ML PO SUSP: 30.0000 mL | Freq: Every day | ORAL | Status: DC | PRN

## 2021-01-26 NOTE — Telephone Encounter (Signed)
Patient got out of the hospital around 01/23/21 and needing an hospital follow up. No available appointments, Please advise

## 2021-01-26 NOTE — Progress Notes (Signed)
PHARMACY NOTE:  ANTIMICROBIAL RENAL DOSAGE ADJUSTMENT  Current antimicrobial regimen includes a mismatch between antimicrobial dosage and estimated renal function.  As per policy approved by the Pharmacy & Therapeutics and Medical Executive Committees, the antimicrobial dosage will be adjusted accordingly.  Current antimicrobial dosage:  Unasyn 3 gm IV Q6H  Indication: aspiration PNA   Renal Function:  Estimated Creatinine Clearance: 29.5 mL/min (by C-G formula based on SCr of 0.69 mg/dL). []      On intermittent HD, scheduled: []      On CRRT    Antimicrobial dosage has been changed to:  Unasyn 3 gm IV Q12H   Additional comments:   Thank you for allowing pharmacy to be a part of this patient's care.  Alexsa Flaum D, [facility] 01/26/2021 11:29 PM

## 2021-01-26 NOTE — Progress Notes (Signed)
PIV consult: Arrived to room, pt off the floor.

## 2021-01-26 NOTE — Progress Notes (Addendum)
PIV inserted in CT. IV Team consult canceled.

## 2021-01-26 NOTE — ED Notes (Signed)
HR of 25 charted in Error at this time. Pt has a steady pulse of 70 and is alert to staff and family.

## 2021-01-26 NOTE — ED Triage Notes (Signed)
Pt is a week post covid and called EMS for SOB. Pt is on 4L/min via [place] at this time.

## 2021-01-26 NOTE — ED Provider Notes (Signed)
[facility] Emergency Department Provider Note  ____________________________________________   Event Date/Time   First MD Initiated Contact with Patient 01/26/21 1629     (approximate)  I have reviewed the triage vital signs and the nursing notes.   HISTORY  Chief Complaint Shortness of Breath    HPI Taylor Watson is a 87 y.o. female with history of CHF, CVA who comes in with concerns for shortness of breath.  Patient was admitted 9/13-9/18 for dehydration and hypotension in the setting of diarrhea and COVID-19.  Patient comes in today for worsening shortness of breath.  Patient is alert and oriented x2 but I am not able to get any history of why she is here today.  Unable to get full HPI due to patient's dementia.  On review of records patient is 20 days post-COVID.           Past Medical History:  Diagnosis Date   Allergy    Anemia    Arrhythmia    CHF (congestive heart failure) ([place])    CVA (cerebral vascular accident) ([place])    Depression    GERD (gastroesophageal reflux disease)    History of blood transfusion    History of kidney stones    Hx: UTI (urinary tract infection)    Hypercholesterolemia    Hypertension     Patient Active Problem List   Diagnosis Date Noted   Protein-calorie malnutrition, severe 01/18/2021   Pressure injury of skin 01/17/2021   E. coli UTI 01/14/2021   Hypotension 01/13/2021   Acute kidney injury superimposed on CKD ([place]) 01/13/2021   Severe malnutrition ([place]) 01/08/2021   Pneumonia due to COVID-19 virus 01/07/2021   Diarrhea 01/06/2021   COVID-19 virus infection 01/06/2021   Hypoglycemia without diagnosis of diabetes mellitus 01/06/2021   COPD (chronic obstructive pulmonary disease) ([place]) 11/03/2020   Aortic atherosclerosis ([place]) 11/02/2020   Hypertensive kidney disease with CKD (chronic kidney disease) stage V ([place]) 11/02/2020   CKD (chronic kidney disease), stage III ([place]) 11/02/2020    Incontinence 11/02/2020   Conjunctivitis 10/03/2020   Discoloration of skin 07/25/2020   Bad odor of urine 05/11/2020   Bradycardia 03/29/2020   Sebaceous cyst 03/29/2020   Weakness 03/29/2020   UTI (urinary tract infection) 03/19/2020   NSTEMI (non-ST elevated myocardial infarction) ([place]) 03/19/2020   Chronic diastolic CHF (congestive heart failure) ([place])    B12 deficiency 10/13/2019   Skin lesion 08/19/2019   Acute exacerbation of CHF (congestive heart failure) ([place]) 07/22/2019   Acute respiratory failure with hypoxia ([place]) 07/22/2019   Leg swelling 07/22/2019   Atrial fibrillation ([place]) 07/22/2019   Fall at home, initial encounter 07/22/2019   Volume overload 07/19/2019   Hypertensive urgency 33/29/5188   Acute metabolic encephalopathy 41/66/0630   Elevated troponin 05/18/2019   Hypokalemia 05/18/2019   URI (upper respiratory infection) 08/26/2018   Headache 07/17/2017   SOB (shortness of breath) 03/28/2016   Near syncope 03/01/2016   Anemia 10/28/2015   Lower GI bleed    Goals of care, counseling/discussion    Blood loss anemia 06/01/2015   GERD (gastroesophageal reflux disease)    Mild depression ([place])    Unsteady gait 02/08/2015   Schwannoma 10/07/2013   Rectal bleeding 04/03/2013   Light headedness 01/19/2013   Loss of weight 10/22/2012   Hypercholesterolemia 10/22/2012   History of CVA (cerebrovascular accident) 10/22/2012   Anxiety 10/22/2012   Essential hypertension, benign 10/22/2012    Past Surgical History:  Procedure Laterality Date  CHOLECYSTECTOMY  1984   NECK LESION BIOPSY  2015   Followed by Dr. Richardson Landry    Prior to Admission medications   Medication Sig Start Date End Date Taking? Authorizing Provider  ascorbic acid (VITAMIN C) 250 MG tablet Take 1 tablet (250 mg total) by mouth daily. 01/19/21   Loletha Grayer, MD  dexamethasone (DECADRON) 2 MG tablet Two tab po daily for two days then one tab po daily for two days 01/19/21   Loletha Grayer,  MD  feeding supplement (ENSURE ENLIVE / ENSURE PLUS) LIQD Take 237 mLs by mouth 3 (three) times daily between meals. 01/18/21   Loletha Grayer, MD  lactobacillus (FLORANEX/LACTINEX) PACK Take 1 packet (1 g total) by mouth 3 (three) times daily with meals. 01/18/21   Loletha Grayer, MD  midodrine (PROAMATINE) 5 MG tablet Take 1 tablet (5 mg total) by mouth 3 (three) times daily with meals. 01/18/21   Loletha Grayer, MD  zinc sulfate 220 (50 Zn) MG capsule Take 1 capsule (220 mg total) by mouth daily. 01/19/21   Loletha Grayer, MD    Allergies Dyazide [hydrochlorothiazide w-triamterene], Toprol xl [metoprolol tartrate], Monopril [fosinopril], and Verapamil  Family History  Problem Relation Age of Onset   Cancer Mother        unknown type   Hypertension Mother    Cancer Father        unknown type   Cancer Daughter    Colon cancer Other        grandfather    Social History Social History   Tobacco Use   Smoking status: Former   Smokeless tobacco: Former    Types: Loss adjuster, chartered  Substance Use Topics   Alcohol use: No    Alcohol/week: 0.0 standard drinks   Drug use: No      Review of Systems Unable to get review of system from patient due to dementia ____________________________________________   PHYSICAL EXAM:  VITAL SIGNS: Blood pressure (!) 115/55, pulse 74, temperature 98.5 F (36.9 C), temperature source Oral, resp. rate 17, SpO2 94 %.   Constitutional: Alert and oriented x2.  Elderly female Eyes: Conjunctivae are normal. EOMI. Head: Atraumatic. Nose: No congestion/rhinnorhea. Mouth/Throat: Mucous membranes are moist.   Neck: No stridor. Trachea Midline. FROM Cardiovascular: Normal rate, regular rhythm. Grossly normal heart sounds.  Good peripheral circulation. Respiratory: Clear lungs, no increased work of breathing.  On 3 L of oxygen Gastrointestinal: Soft and nontender. No distention. No abdominal bruits.  Musculoskeletal: No lower extremity tenderness nor  edema.  No joint effusions. Neurologic:  Normal speech and language. No gross focal neurologic deficits are appreciated.  Skin:  Skin is warm, dry and intact. No rash noted. Psychiatric: Mood and affect are normal. Speech and behavior are normal. GU: Deferred   ____________________________________________   LABS (all labs ordered are listed, but only abnormal results are displayed)  Labs Reviewed  CBC WITH DIFFERENTIAL/PLATELET - Abnormal; Notable for the following components:      Result Value   Lymphs Abs 0.4 (*)    All other components within normal limits  COMPREHENSIVE METABOLIC PANEL - Abnormal; Notable for the following components:   Calcium 6.7 (*)    Total Protein 4.8 (*)    Albumin 2.4 (*)    All other components within normal limits  BRAIN NATRIURETIC PEPTIDE - Abnormal; Notable for the following components:   B Natriuretic Peptide 519.3 (*)    All other components within normal limits  PROCALCITONIN  URINALYSIS, ROUTINE W REFLEX MICROSCOPIC  PROCALCITONIN  TROPONIN I (HIGH SENSITIVITY)  TROPONIN I (HIGH SENSITIVITY)   ____________________________________________   ED ECG REPORT I, Vanessa Wilsall, the attending physician, personally viewed and interpreted this ECG.  Atrial fibrillation rate of 90 without any ST elevation or T wave inversions although there is a lot of artifact.  She does have an occasional PVC ____________________________________________  RADIOLOGY Robert Bellow, personally viewed and evaluated these images (plain radiographs) as part of my medical decision making, as well as reviewing the written report by the radiologist.  ED MD interpretation: Mild diffuse opacification  Official radiology report(s): CT Angio Chest PE W and/or Wo Contrast  Result Date: 01/26/2021 CLINICAL DATA:  Pt is a week post covid and called EMS for SOB. EXAM: CT ANGIOGRAPHY CHEST WITH CONTRAST TECHNIQUE: Multidetector CT imaging of the chest was performed using the  standard protocol during bolus administration of intravenous contrast. Multiplanar CT image reconstructions and MIPs were obtained to evaluate the vascular anatomy. CONTRAST:  3mL OMNIPAQUE IOHEXOL 350 MG/ML SOLN COMPARISON:  None. FINDINGS: Cardiovascular: Satisfactory opacification of the pulmonary arteries to the segmental level. Vascular cutoff of the segmental and subsegmental left lower and right lower lobe pulmonary arteries likely due to underlying lung findings with redistribution of the vascular flow. No definite filling defect suggestive of pulmonary embolus. Main pulmonary artery is normal in caliber. Normal heart size. No significant pericardial effusion. The thoracic aorta is normal in caliber. Severe atherosclerotic plaque of the thoracic aorta. Four-vessel coronary artery calcifications. Bulky mitral annular calcifications. Mediastinum/Nodes: No enlarged mediastinal, hilar, or axillary lymph nodes. Debris noted within the trachea. Thyroid gland and esophagus demonstrate no significant findings. Lungs/Pleura: Paraseptal and centrilobular at least moderate emphysematous changes. Left lower lobe peripheral reticulations and ground-glass airspace opacities similar findings within the right lower lobe to a lesser extent as well as lingula. No pulmonary nodule. No pulmonary mass. No pleural effusion. No pneumothorax. Upper Abdomen: No acute abnormality. Musculoskeletal: No abdominal wall hernia or abnormality. No suspicious lytic or blastic osseous lesions. No acute displaced fracture. Multilevel degenerative changes of the spine. Review of the MIP images confirms the above findings. IMPRESSION: 1. No pulmonary embolus. 2. Bilateral lower lobe and lingular peripheral reticulations and ground-glass airspace opacities consistent with infection/inflammation. 3. Debris noted within the trachea. 4. Aortic Atherosclerosis (ICD10-I70.0) and Emphysema (ICD10-J43.9). Electronically Signed   By: Iven Finn  M.D.   On: 01/26/2021 20:40   DG Chest Portable 1 View  Result Date: 01/26/2021 CLINICAL DATA:  Shortness of breath, COVID 1 week ago EXAM: PORTABLE CHEST 1 VIEW COMPARISON:  01/22/2021 FINDINGS: Cardiomegaly. Pulmonary hyperinflation and emphysema. Mild diffuse bilateral interstitial pulmonary opacity. Probable small pleural effusions. IMPRESSION: Cardiomegaly and mild diffuse bilateral interstitial pulmonary opacity, which may reflect mild edema and or infection, including COVID airspace disease. Electronically Signed   By: Eddie Candle M.D.   On: 01/26/2021 17:50    ____________________________________________   PROCEDURES  Procedure(s) performed (including Critical Care):  .1-3 Lead EKG Interpretation Performed by: Vanessa McComb, MD Authorized by: Vanessa Borden, MD     Interpretation: abnormal     ECG rate:  80-90s   ECG rate assessment: normal     Rhythm: atrial flutter     Ectopy: none     Conduction: normal     ____________________________________________   INITIAL IMPRESSION / ASSESSMENT AND PLAN / ED COURSE   PRESLYN WARR was evaluated in Emergency Department on 01/26/2021 for the symptoms described in the history of present illness. She  was evaluated in the context of the global COVID-19 pandemic, which necessitated consideration that the patient might be at risk for infection with the SARS-CoV-2 virus that causes COVID-19. Institutional protocols and algorithms that pertain to the evaluation of patients at risk for COVID-19 are in a state of rapid change based on information released by regulatory bodies including the CDC and federal and state organizations. These policies and algorithms were followed during the patient's care in the ED.     Patient comes in for shortness of breath 20 days post-COVID.  We will need to call family to figure out if she is on oxygen at home.  Will get chest x-ray to evaluate for any pneumonia, labs to evaluate for ACS, fluid.  If chest  x-ray is negative and she is requiring oxygen may need CT scan to evaluate for PE.  We will need to have a goals of care conversation with family.  Labs are reassuring however testing does have some infection versus edema.  Family is now at bedside he states that they want a CT scan to make sure there is nothing else going on in the lungs.  Explained that we can do a CT to make sure is no PEs given she has been reportedly coughing up some blood and he states that she will cough up so much mucus that she will start to trip on it.  They are interested in antibiotics if they think that that would help.   Patient CT scan does show concerns for lower lobe infection versus inflammation but also debris noted within the trachea.  Concerned that patient could be aspirating and will start on some Unasyn.  Discussed further with the family and they state that she seems to be choking on her pills and that she is coughing up a lot of mucus.  I am concerned that patient could be aspirating and we discussed goals of care and they feel that the patient cannot go home therefore will admit to the hospital team for SLP study, PT and unasyn for concern for aspiration     ____________________________________________   FINAL CLINICAL IMPRESSION(S) / ED DIAGNOSES   Final diagnoses:  Aspiration pneumonia of both lungs, unspecified aspiration pneumonia type, unspecified part of lung ([place])     MEDICATIONS GIVEN DURING THIS VISIT:  Medications  Ampicillin-Sulbactam (UNASYN) 3 g in sodium chloride 0.9 % 100 mL IVPB (has no administration in time range)  iohexol (OMNIPAQUE) 350 MG/ML injection 75 mL (75 mLs Intravenous Contrast Given 01/26/21 1951)     ED Discharge Orders     None        Note:  This document was prepared using Dragon voice recognition software and may include unintentional dictation errors.   Vanessa [place], MD 01/26/21 2129

## 2021-01-26 NOTE — H&P (Signed)
[place]   PATIENT NAME: Taylor Watson    MR#:  [id number]  DATE OF BIRTH:  1918-08-03  DATE OF ADMISSION:  01/26/2021  PRIMARY CARE PHYSICIAN: [name], MD   Patient is coming from: Home  REQUESTING/REFERRING PHYSICIAN: Marjean Donna, MD  CHIEF COMPLAINT:   Chief Complaint  Patient presents with   Shortness of Breath    HISTORY OF PRESENT ILLNESS:  Taylor Watson is a 87 y.o. African-American female with medical history significant for CHF, CVA, depression, hypertension and dyslipidemia, who presented to the ER with acute onset of dyspnea with increased work of breathing and associated cough productive of thick mucus.  She has been having difficulty swallowing her pills and occasionally gagging.  Her sputum has been occasionally brownish.  She usually uses 2 L O2 by nasal cannula.  She admitted to nausea today with associated vomiting as well as diarrhea with loose bowel movements.  She admits to loss of taste and smell.  She was diagnosed with COVID-19 20 days ago and admitted here 9/6-9/8.  She has been having diminished appetite.  No chest pain or palpitations.  No dysuria, oliguria or hematuria or flank pain.  ED Course: When she came to ER blood pressure was 115/55 with otherwise normal vital signs.  Pulse oximetry was 94% on 2 L and dropped to 86% then came back up to 95%. EKG as reviewed by me : EKG showed atrial fibrillation with controlled ventricular response  of 90 though it was suboptimal.  It showed LVH and Q waves anteroseptally as well as prolonged QT interval with QTC of 534 MS. Imaging: Chest x-ray showed cardiomegaly with mild diffuse bilateral anesthesia pulmonary opacity may reflect mild edema and/or infection including COVID airspace disease. Chest CTA showed the following: 1. No pulmonary embolus. 2. Bilateral lower lobe and lingular peripheral reticulations and ground-glass airspace opacities consistent with infection/inflammation. 3.  Debris noted within the trachea. 4. Aortic Atherosclerosis .  The patient was given IV Unasyn in the ER.  She will be admitted to a medical monitored bed for further evaluation and management. PAST MEDICAL HISTORY:   Past Medical History:  Diagnosis Date   Allergy    Anemia    Arrhythmia    CHF (congestive heart failure) (HCC)    CVA (cerebral vascular accident) ([place])    Depression    GERD (gastroesophageal reflux disease)    History of blood transfusion    History of kidney stones    Hx: UTI (urinary tract infection)    Hypercholesterolemia    Hypertension     PAST SURGICAL HISTORY:   Past Surgical History:  Procedure Laterality Date   CHOLECYSTECTOMY  1984   NECK LESION BIOPSY  2015   Followed by Dr. Richardson Landry    SOCIAL HISTORY:   Social History   Tobacco Use   Smoking status: Former   Smokeless tobacco: Former    Types: Loss adjuster, chartered  Substance Use Topics   Alcohol use: No    Alcohol/week: 0.0 standard drinks    FAMILY HISTORY:   Family History  Problem Relation Age of Onset   Cancer Mother        unknown type   Hypertension Mother    Cancer Father        unknown type   Cancer Daughter    Colon cancer Other        grandfather    DRUG ALLERGIES:   Allergies  Allergen Reactions   Dyazide [Hydrochlorothiazide W-Triamterene]  Other (See Comments)    Unknown reaction   Toprol Xl [Metoprolol Tartrate] Other (See Comments)    unknown   Monopril [Fosinopril] Cough   Verapamil Other (See Comments)    headache    REVIEW OF SYSTEMS:   ROS As per history of present illness. All pertinent systems were reviewed above. Constitutional, HEENT, cardiovascular, respiratory, GI, GU, musculoskeletal, neuro, psychiatric, endocrine, integumentary and hematologic systems were reviewed and are otherwise negative/unremarkable except for positive findings mentioned above in the HPI.   MEDICATIONS AT HOME:   Prior to Admission medications   Medication Sig Start Date End  Date Taking? Authorizing Provider  ascorbic acid (VITAMIN C) 250 MG tablet Take 1 tablet (250 mg total) by mouth daily. 01/19/21   Loletha Grayer, MD  dexamethasone (DECADRON) 2 MG tablet Two tab po daily for two days then one tab po daily for two days 01/19/21   Loletha Grayer, MD  feeding supplement (ENSURE ENLIVE / ENSURE PLUS) LIQD Take 237 mLs by mouth 3 (three) times daily between meals. 01/18/21   Loletha Grayer, MD  lactobacillus (FLORANEX/LACTINEX) PACK Take 1 packet (1 g total) by mouth 3 (three) times daily with meals. 01/18/21   Loletha Grayer, MD  midodrine (PROAMATINE) 5 MG tablet Take 1 tablet (5 mg total) by mouth 3 (three) times daily with meals. 01/18/21   Loletha Grayer, MD  zinc sulfate 220 (50 Zn) MG capsule Take 1 capsule (220 mg total) by mouth daily. 01/19/21   Loletha Grayer, MD      VITAL SIGNS:  Blood pressure 123/82, pulse 81, temperature 98.5 F (36.9 C), temperature source Oral, resp. rate 20, SpO2 90 %.  PHYSICAL EXAMINATION:  Physical Exam  GENERAL:  87 y.o.-year-old African-American female patient lying in the bed with no acute distress.  EYES: Pupils equal, round, reactive to light and accommodation. No scleral icterus. Extraocular muscles intact.  HEENT: Head atraumatic, normocephalic. Oropharynx and nasopharynx clear.  NECK:  Supple, no jugular venous distention. No thyroid enlargement, no tenderness.  LUNGS: Diminished bibasilar breath sounds with bibasal crackles. CARDIOVASCULAR: Regular rate and rhythm, S1, S2 normal. No murmurs, rubs, or gallops.  ABDOMEN: Soft, nondistended, nontender. Bowel sounds present. No organomegaly or mass.  EXTREMITIES: No pedal edema, cyanosis, or clubbing.  NEUROLOGIC: Cranial nerves II through XII are intact. Muscle strength 5/5 in all extremities. Sensation intact. Gait not checked.  PSYCHIATRIC: The patient is alert and oriented x 3.  Normal affect and good eye contact. SKIN: No obvious rash, lesion, or ulcer.    LABORATORY PANEL:   CBC Recent Labs  Lab 01/26/21 1652  WBC 5.2  HGB 14.4  HCT 43.9  PLT 158   ------------------------------------------------------------------------------------------------------------------  Chemistries  Recent Labs  Lab 01/26/21 1652  NA 140  K 4.2  CL 110  CO2 25  GLUCOSE 70  BUN 18  CREATININE 0.69  CALCIUM 6.7*  AST 28  ALT 15  ALKPHOS 56  BILITOT 1.1   ------------------------------------------------------------------------------------------------------------------  Cardiac Enzymes No results for input(s): TROPONINI in the last 168 hours. ------------------------------------------------------------------------------------------------------------------  RADIOLOGY:  CT Angio Chest PE W and/or Wo Contrast  Result Date: 01/26/2021 CLINICAL DATA:  Pt is a week post covid and called EMS for SOB. EXAM: CT ANGIOGRAPHY CHEST WITH CONTRAST TECHNIQUE: Multidetector CT imaging of the chest was performed using the standard protocol during bolus administration of intravenous contrast. Multiplanar CT image reconstructions and MIPs were obtained to evaluate the vascular anatomy. CONTRAST:  7mL OMNIPAQUE IOHEXOL 350 MG/ML SOLN COMPARISON:  None.  FINDINGS: Cardiovascular: Satisfactory opacification of the pulmonary arteries to the segmental level. Vascular cutoff of the segmental and subsegmental left lower and right lower lobe pulmonary arteries likely due to underlying lung findings with redistribution of the vascular flow. No definite filling defect suggestive of pulmonary embolus. Main pulmonary artery is normal in caliber. Normal heart size. No significant pericardial effusion. The thoracic aorta is normal in caliber. Severe atherosclerotic plaque of the thoracic aorta. Four-vessel coronary artery calcifications. Bulky mitral annular calcifications. Mediastinum/Nodes: No enlarged mediastinal, hilar, or axillary lymph nodes. Debris noted within the trachea.  Thyroid gland and esophagus demonstrate no significant findings. Lungs/Pleura: Paraseptal and centrilobular at least moderate emphysematous changes. Left lower lobe peripheral reticulations and ground-glass airspace opacities similar findings within the right lower lobe to a lesser extent as well as lingula. No pulmonary nodule. No pulmonary mass. No pleural effusion. No pneumothorax. Upper Abdomen: No acute abnormality. Musculoskeletal: No abdominal wall hernia or abnormality. No suspicious lytic or blastic osseous lesions. No acute displaced fracture. Multilevel degenerative changes of the spine. Review of the MIP images confirms the above findings. IMPRESSION: 1. No pulmonary embolus. 2. Bilateral lower lobe and lingular peripheral reticulations and ground-glass airspace opacities consistent with infection/inflammation. 3. Debris noted within the trachea. 4. Aortic Atherosclerosis (ICD10-I70.0) and Emphysema (ICD10-J43.9). Electronically Signed   By: Iven Finn M.D.   On: 01/26/2021 20:40   DG Chest Portable 1 View  Result Date: 01/26/2021 CLINICAL DATA:  Shortness of breath, COVID 1 week ago EXAM: PORTABLE CHEST 1 VIEW COMPARISON:  01/22/2021 FINDINGS: Cardiomegaly. Pulmonary hyperinflation and emphysema. Mild diffuse bilateral interstitial pulmonary opacity. Probable small pleural effusions. IMPRESSION: Cardiomegaly and mild diffuse bilateral interstitial pulmonary opacity, which may reflect mild edema and or infection, including COVID airspace disease. Electronically Signed   By: Eddie Candle M.D.   On: 01/26/2021 17:50      IMPRESSION AND PLAN:  Active Problems:   Aspiration pneumonia ([place])  1.  Multifocal pneumonia likely aspiration pneumonia. - The patient will be admitted to a medical monitored bed. - We will place her on IV Unasyn given aspiration and IV Zithromax. - Mucolytic therapy will be provided. - Bronchodilator therapy will be provided. - We will follow blood and sputum  culture.  2.  Recent COVID-19. - The patient has not been vaccinated for COVID-19 and may be offered the vaccine while she is here. - She had COVID 19 more than 3 weeks ago so at this time we will not placing her on isolation.  3.  Hypocalcemia. - Calcium will be replaced.  Her corrected calcium is currently 8.  4.  Elevated BN P. - She may be having mild acute cor pulmonale. - We will gently diurese her.  DVT prophylaxis: Lovenox. Code Status: f the patient is DNR/DNI.   Family Communication:  The plan of care was discussed in details with the patient (and family). I answered all questions. The patient agreed to proceed with the above mentioned plan. Further management will depend upon hospital course. Disposition Plan: Back to previous home environment Consults called: none. All the records are reviewed and case discussed with ED provider.  Status is: Inpatient  Remains inpatient appropriate because:Ongoing active pain requiring inpatient pain management, Ongoing diagnostic testing needed not appropriate for outpatient work up, Unsafe d/c plan, IV treatments appropriate due to intensity of illness or inability to take PO, and Inpatient level of care appropriate due to severity of illness  Dispo: The patient is from: Home  Anticipated d/c is to: Home              Patient currently is not medically stable to d/c.   Difficult to place patient No   TOTAL TIME TAKING CARE OF THIS PATIENT: 55 minutes.    Christel Mormon M.D on 01/26/2021 at 10:51 PM  [facility]   From 7 PM-7 AM, contact night-coverage [email or web address]  CC: Primary care physician; [name], MD

## 2021-01-26 NOTE — Telephone Encounter (Signed)
See other message

## 2021-01-26 NOTE — Telephone Encounter (Signed)
9/28 appointment was taken, scheduled for 9/30 at 2:00.

## 2021-01-27 ENCOUNTER — Encounter: Payer: Self-pay | Admitting: Family Medicine

## 2021-01-27 ENCOUNTER — Other Ambulatory Visit: Payer: Self-pay

## 2021-01-27 DIAGNOSIS — J690 Pneumonitis due to inhalation of food and vomit: Secondary | ICD-10-CM | POA: Diagnosis not present

## 2021-01-27 LAB — URINALYSIS, ROUTINE W REFLEX MICROSCOPIC
Bilirubin Urine: NEGATIVE
Glucose, UA: NEGATIVE mg/dL
Ketones, ur: 5 mg/dL — AB
Nitrite: NEGATIVE
Protein, ur: NEGATIVE mg/dL
Specific Gravity, Urine: 1.024 (ref 1.005–1.030)
pH: 5 (ref 5.0–8.0)

## 2021-01-27 LAB — EXPECTORATED SPUTUM ASSESSMENT W GRAM STAIN, RFLX TO RESP C

## 2021-01-27 LAB — RESP PANEL BY RT-PCR (FLU A&B, COVID) ARPGX2
Influenza A by PCR: NEGATIVE
Influenza B by PCR: NEGATIVE
SARS Coronavirus 2 by RT PCR: POSITIVE — AB

## 2021-01-27 LAB — CBC
HCT: 42.2 % (ref 36.0–46.0)
Hemoglobin: 13.2 g/dL (ref 12.0–15.0)
MCH: 28.1 pg (ref 26.0–34.0)
MCHC: 31.3 g/dL (ref 30.0–36.0)
MCV: 89.8 fL (ref 80.0–100.0)
Platelets: 132 10*3/uL — ABNORMAL LOW (ref 150–400)
RBC: 4.7 MIL/uL (ref 3.87–5.11)
RDW: 15.3 % (ref 11.5–15.5)
WBC: 5.5 10*3/uL (ref 4.0–10.5)
nRBC: 0 % (ref 0.0–0.2)

## 2021-01-27 LAB — BASIC METABOLIC PANEL
Anion gap: 13 (ref 5–15)
BUN: 21 mg/dL (ref 8–23)
CO2: 26 mmol/L (ref 22–32)
Calcium: 8.3 mg/dL — ABNORMAL LOW (ref 8.9–10.3)
Chloride: 103 mmol/L (ref 98–111)
Creatinine, Ser: 0.86 mg/dL (ref 0.44–1.00)
GFR, Estimated: >60 mL/min (ref >60)
Glucose, Bld: 65 mg/dL — ABNORMAL LOW (ref 70–99)
Potassium: 3.8 mmol/L (ref 3.5–5.1)
Sodium: 142 mmol/L (ref 135–145)

## 2021-01-27 LAB — TROPONIN I (HIGH SENSITIVITY)
Troponin I (High Sensitivity): 47 ng/L — ABNORMAL HIGH (ref <18)
Troponin I (High Sensitivity): 96 ng/L — ABNORMAL HIGH (ref <18)

## 2021-01-27 LAB — PROCALCITONIN: Procalcitonin: <0.1 ng/mL

## 2021-01-27 MED ORDER — HALOPERIDOL LACTATE 5 MG/ML IJ SOLN: 1.0000 mg | Freq: Four times a day (QID) | INTRAMUSCULAR | Status: DC | PRN

## 2021-01-27 MED ORDER — HALOPERIDOL LACTATE 5 MG/ML IJ SOLN
INTRAMUSCULAR | Status: AC
  Administered 2021-01-27: 1 mg via INTRAMUSCULAR
  Filled 2021-01-27: qty 1

## 2021-01-27 MED ORDER — FUROSEMIDE 10 MG/ML IJ SOLN
20.0000 mg | Freq: Two times a day (BID) | INTRAMUSCULAR | Status: DC
  Administered 2021-01-27: 20 mg via INTRAVENOUS
  Filled 2021-01-27: qty 4

## 2021-01-27 MED ORDER — DEXTROSE IN LACTATED RINGERS 5 % IV SOLN: INTRAVENOUS | Status: DC

## 2021-01-27 NOTE — Progress Notes (Signed)
   Patient is active w/ [facility] for RN, PT and Social Work.

## 2021-01-27 NOTE — Progress Notes (Signed)
PROGRESS NOTE    Taylor Watson  WNI:[id number] DOB: 11/15/1918 DOA: 01/26/2021 PCP: [name], MD   Brief Narrative:  87 y.o. African-American female with medical history significant for CHF, CVA, depression, hypertension and dyslipidemia, who presented to the ER with acute onset of dyspnea with increased work of breathing and associated cough productive of thick mucus.  She has been having difficulty swallowing her pills and occasionally gagging.  Her sputum has been occasionally brownish.  She usually uses 2 L O2 by nasal cannula.  She admitted to nausea today with associated vomiting as well as diarrhea with loose bowel movements.  She admits to loss of taste and smell.  She was diagnosed with COVID-19 20 days ago and admitted here 9/6-9/8.  She has been having diminished appetite.  No chest pain or palpitations.  No dysuria, oliguria or hematuria or flank pain.  Imaging consistent with multifocal pneumonia.  Debris noted within the trachea very suspicious for aspiration.   Assessment & Plan:   Active Problems:   Aspiration pneumonia (HCC)  Suspected aspiration pneumonia Acute hypoxic respiratory failure At 87 years old I suspect patient has a chronic aspirator Debris noted in trachea concerning for this diagnosis Does not meet sepsis criteria Plan: Continue IV Unasyn, limit course to 5 days Continue Zithromax limit course to 3 days Mucolytic's bronchodilators Follow blood and urine cultures Monitor vitals and fever curve Wean oxygen as tolerated  Recent COVID infection COVID positivity With multifocal infiltrates difficult to distinguish between bacterial versus viral pneumonia Procalcitonin negative Patient was recently admitted for COVID infection She is unvaccinated She tested positive again today to 9/27 Plan: Continue respiratory isolation  Hypocalcemia IV replacement  Atrial fibrillation Controlled rate, noted on telemetry and EKG Given advanced  age will not anticoagulate Hold rate control for now    DVT prophylaxis: SQ Lovenox Code Status: DNR Family Communication: Daughter Nicanor Alcon [phone] on 9/27 Disposition Plan: Status is: Inpatient  Remains inpatient appropriate because:Inpatient level of care appropriate due to severity of illness  Dispo: The patient is from: Home              Anticipated d/c is to:  TBD              Patient currently is not medically stable to d/c.   Difficult to place patient No  87 year old female, recent COVID infection presents for presumed aspiration pneumonia.  On IV Unasyn.  Will likely benefit from palliative care evaluation.  Consult placed     Level of care: Med-Surg  Consultants:  Palliative care  Procedures:  None  Antimicrobials:  Unasyn Azithromycin   Subjective: Seen and examined.  Unable to provide much history.  Sleepy on my evaluation.  Objective: Vitals:   01/27/21 1200 01/27/21 1300 01/27/21 1400 01/27/21 1437  BP: 115/73 (!) 104/59 140/90 (!) 154/86  Pulse: 74 (!) 58 73 69  Resp: 15 (!) 21 18 18   Temp:    98.3 F (36.8 C)  TempSrc:      SpO2: 100% 98% 98%     Intake/Output Summary (Last 24 hours) at 01/27/2021 1452 Last data filed at 01/27/2021 1009 Gross per 24 hour  Intake 100 ml  Output --  Net 100 ml   There were no vitals filed for this visit.  Examination:  General exam: No acute distress.  Appears frail Respiratory system: Scattered crackles bilaterally.  Normal work of breathing.  2 L Cardiovascular system: S1-S2, regular rate, irregular rhythm, no murmurs, no pedal edema  Gastrointestinal system: Thin, nontender, nondistended, normal bowel sounds Central nervous system: Alert.  Oriented x1.  Unable to fully assess neurologic status Extremities: Decreased power symmetrically Skin: No rashes, lesions or ulcers Psychiatry: Judgement and insight appear impaired. Mood & affect flattened.     Data Reviewed: I have personally  reviewed following labs and imaging studies  CBC: Recent Labs  Lab 01/22/21 1506 01/26/21 1652 01/27/21 0612  WBC 4.2 5.2 5.5  NEUTROABS  --  4.3  --   HGB 15.4* 14.4 13.2  HCT 47.8* 43.9 42.2  MCV 90.2 90.3 89.8  PLT 204 158 382*   Basic Metabolic Panel: Recent Labs  Lab 01/22/21 1506 01/26/21 1652 01/27/21 0612  NA 136 140 142  K 4.9 4.2 3.8  CL 102 110 103  CO2 29 25 26   GLUCOSE 84 70 65*  BUN 33* 18 21  CREATININE 0.69 0.69 0.86  CALCIUM 8.9 6.7* 8.3*   GFR: Estimated Creatinine Clearance: 27.5 mL/min (by C-G formula based on SCr of 0.86 mg/dL). Liver Function Tests: Recent Labs  Lab 01/22/21 1506 01/26/21 1652  AST 24 28  ALT 15 15  ALKPHOS 57 56  BILITOT 1.1 1.1  PROT 6.4* 4.8*  ALBUMIN 3.2* 2.4*   No results for input(s): LIPASE, AMYLASE in the last 168 hours. No results for input(s): AMMONIA in the last 168 hours. Coagulation Profile: No results for input(s): INR, PROTIME in the last 168 hours. Cardiac Enzymes: No results for input(s): CKTOTAL, CKMB, CKMBINDEX, TROPONINI in the last 168 hours. BNP (last 3 results) No results for input(s): PROBNP in the last 8760 hours. HbA1C: No results for input(s): HGBA1C in the last 72 hours. CBG: No results for input(s): GLUCAP in the last 168 hours. Lipid Profile: No results for input(s): CHOL, HDL, LDLCALC, TRIG, CHOLHDL, LDLDIRECT in the last 72 hours. Thyroid Function Tests: No results for input(s): TSH, T4TOTAL, FREET4, T3FREE, THYROIDAB in the last 72 hours. Anemia Panel: No results for input(s): VITAMINB12, FOLATE, FERRITIN, TIBC, IRON, RETICCTPCT in the last 72 hours. Sepsis Labs: Recent Labs  Lab 01/26/21 1652 01/27/21 0612  PROCALCITON <0.10 <0.10    Recent Results (from the past 240 hour(s))  Resp Panel by RT-PCR (Flu A&B, Covid) Nasopharyngeal Swab     Status: Abnormal   Collection Time: 01/27/21  8:06 AM   Specimen: Nasopharyngeal Swab; Nasopharyngeal(NP) swabs in vial transport medium   Result Value Ref Range Status   SARS Coronavirus 2 by RT PCR POSITIVE (A) NEGATIVE Final    Comment: RESULT CALLED TO, READ BACK BY AND VERIFIED WITH: REINA TALJOUR 01/27/21 1045 KLW (NOTE) SARS-CoV-2 target nucleic acids are DETECTED.  The SARS-CoV-2 RNA is generally detectable in upper respiratory specimens during the acute phase of infection. Positive results are indicative of the presence of the identified virus, but do not rule out bacterial infection or co-infection with other pathogens not detected by the test. Clinical correlation with patient history and other diagnostic information is necessary to determine patient infection status. The expected result is Negative.  Fact Sheet for Patients: EntrepreneurPulse.com.au  Fact Sheet for Healthcare Providers: IncredibleEmployment.be  This test is not yet approved or cleared by the [place] FDA and  has been authorized for detection and/or diagnosis of SARS-CoV-2 by FDA under an Emergency Use Authorization (EUA).  This EUA will remain in effect (meaning this test can be [place] ed) for the duration of  the COVID-19 declaration under Section 564(b)(1) of the Act, 21 U.S.C. section 360bbb-3(b)(1), unless the authorization is  terminated or revoked sooner.     Influenza A by PCR NEGATIVE NEGATIVE Final   Influenza B by PCR NEGATIVE NEGATIVE Final    Comment: (NOTE) The Xpert Xpress SARS-CoV-2/FLU/RSV plus assay is intended as an aid in the diagnosis of influenza from Nasopharyngeal swab specimens and should not be used as a sole basis for treatment. Nasal washings and aspirates are unacceptable for Xpert Xpress SARS-CoV-2/FLU/RSV testing.  Fact Sheet for Patients: EntrepreneurPulse.com.au  Fact Sheet for Healthcare Providers: IncredibleEmployment.be  This test is not yet approved or cleared by the [place] FDA and has been authorized for detection  and/or diagnosis of SARS-CoV-2 by FDA under an Emergency Use Authorization (EUA). This EUA will remain in effect (meaning this test can be used) for the duration of the COVID-19 declaration under Section 564(b)(1) of the Act, 21 U.S.C. section 360bbb-3(b)(1), unless the authorization is terminated or revoked.  Performed at [facility], [address], [place], [place] [id number]          Radiology Studies: CT Angio Chest PE W and/or Wo Contrast  Result Date: 01/26/2021 CLINICAL DATA:  Pt is a week post covid and called EMS for SOB. EXAM: CT ANGIOGRAPHY CHEST WITH CONTRAST TECHNIQUE: Multidetector CT imaging of the chest was performed using the standard protocol during bolus administration of intravenous contrast. Multiplanar CT image reconstructions and MIPs were obtained to evaluate the vascular anatomy. CONTRAST:  28mL OMNIPAQUE IOHEXOL 350 MG/ML SOLN COMPARISON:  None. FINDINGS: Cardiovascular: Satisfactory opacification of the pulmonary arteries to the segmental level. Vascular cutoff of the segmental and subsegmental left lower and right lower lobe pulmonary arteries likely due to underlying lung findings with redistribution of the vascular flow. No definite filling defect suggestive of pulmonary embolus. Main pulmonary artery is normal in caliber. Normal heart size. No significant pericardial effusion. The thoracic aorta is normal in caliber. Severe atherosclerotic plaque of the thoracic aorta. Four-vessel coronary artery calcifications. Bulky mitral annular calcifications. Mediastinum/Nodes: No enlarged mediastinal, hilar, or axillary lymph nodes. Debris noted within the trachea. Thyroid gland and esophagus demonstrate no significant findings. Lungs/Pleura: Paraseptal and centrilobular at least moderate emphysematous changes. Left lower lobe peripheral reticulations and ground-glass airspace opacities similar findings within the right lower lobe to a lesser extent as well as  lingula. No pulmonary nodule. No pulmonary mass. No pleural effusion. No pneumothorax. Upper Abdomen: No acute abnormality. Musculoskeletal: No abdominal wall hernia or abnormality. No suspicious lytic or blastic osseous lesions. No acute displaced fracture. Multilevel degenerative changes of the spine. Review of the MIP images confirms the above findings. IMPRESSION: 1. No pulmonary embolus. 2. Bilateral lower lobe and lingular peripheral reticulations and ground-glass airspace opacities consistent with infection/inflammation. 3. Debris noted within the trachea. 4. Aortic Atherosclerosis (ICD10-I70.0) and Emphysema (ICD10-J43.9). Electronically Signed   By: Iven Finn M.D.   On: 01/26/2021 20:40   DG Chest Portable 1 View  Result Date: 01/26/2021 CLINICAL DATA:  Shortness of breath, COVID 1 week ago EXAM: PORTABLE CHEST 1 VIEW COMPARISON:  01/22/2021 FINDINGS: Cardiomegaly. Pulmonary hyperinflation and emphysema. Mild diffuse bilateral interstitial pulmonary opacity. Probable small pleural effusions. IMPRESSION: Cardiomegaly and mild diffuse bilateral interstitial pulmonary opacity, which may reflect mild edema and or infection, including COVID airspace disease. Electronically Signed   By: Eddie Candle M.D.   On: 01/26/2021 17:50        Scheduled Meds:  ascorbic acid  250 mg Oral Daily   enoxaparin (LOVENOX) injection  30 mg Subcutaneous Q24H   guaiFENesin  600 mg  Oral BID   ipratropium-albuterol  3 mL Nebulization QID   lactobacillus  1 g Oral TID WC   midodrine  5 mg Oral TID WC   zinc sulfate  220 mg Oral Daily   Continuous Infusions:  ampicillin-sulbactam (UNASYN) IV Stopped (01/27/21 1009)   azithromycin Stopped (01/27/21 0110)   dextrose 5% lactated ringers 50 mL/hr at 01/27/21 1010     LOS: 1 day    Time spent: 35 minutes    Sidney Ace, MD [facility] Pager [phone]  If 7PM-7AM, please contact night-coverage 01/27/2021, 2:52 PM

## 2021-01-27 NOTE — ED Notes (Signed)
Lab called. Pt covid positive. Dr Priscella Mann informed.

## 2021-01-27 NOTE — ED Notes (Signed)
Called grandson to give update.

## 2021-01-27 NOTE — ED Notes (Signed)
Informed RN bed assigned 

## 2021-01-27 NOTE — Progress Notes (Signed)
Patient initially positive for COVID on 9/7 admission, remains positive this admission (9/27). Patient out of isolation window, OK to d/c isolation per Dr. Priscella Mann.

## 2021-01-28 ENCOUNTER — Encounter: Payer: Self-pay | Admitting: Family Medicine

## 2021-01-28 ENCOUNTER — Other Ambulatory Visit: Payer: Self-pay

## 2021-01-28 DIAGNOSIS — I5032 Chronic diastolic (congestive) heart failure: Secondary | ICD-10-CM | POA: Diagnosis not present

## 2021-01-28 DIAGNOSIS — G9349 Other encephalopathy: Secondary | ICD-10-CM

## 2021-01-28 DIAGNOSIS — J690 Pneumonitis due to inhalation of food and vomit: Secondary | ICD-10-CM | POA: Diagnosis not present

## 2021-01-28 DIAGNOSIS — Z515 Encounter for palliative care: Secondary | ICD-10-CM

## 2021-01-28 DIAGNOSIS — I480 Paroxysmal atrial fibrillation: Secondary | ICD-10-CM

## 2021-01-28 DIAGNOSIS — J9601 Acute respiratory failure with hypoxia: Secondary | ICD-10-CM

## 2021-01-28 DIAGNOSIS — Z7189 Other specified counseling: Secondary | ICD-10-CM

## 2021-01-28 DIAGNOSIS — I959 Hypotension, unspecified: Secondary | ICD-10-CM

## 2021-01-28 LAB — PROCALCITONIN: Procalcitonin: 0.25 ng/mL

## 2021-01-28 MED ORDER — LORAZEPAM 1 MG PO TABS: 1.0000 mg | ORAL_TABLET | ORAL | Status: DC | PRN

## 2021-01-28 MED ORDER — MORPHINE SULFATE (CONCENTRATE) 10 MG/0.5ML PO SOLN
5.0000 mg | ORAL | Status: DC | PRN
Start: 2021-01-28 — End: 2021-01-29

## 2021-01-28 MED ORDER — LORAZEPAM 2 MG/ML IJ SOLN: 1.0000 mg | INTRAMUSCULAR | Status: DC | PRN

## 2021-01-28 MED ORDER — ACETAMINOPHEN 650 MG RE SUPP
650.0000 mg | Freq: Four times a day (QID) | RECTAL | Status: DC | PRN
  Filled 2021-01-28: qty 1

## 2021-01-28 MED ORDER — BIOTENE DRY MOUTH MT LIQD: 15.0000 mL | OROMUCOSAL | Status: DC | PRN

## 2021-01-28 MED ORDER — ONDANSETRON HCL 4 MG/2ML IJ SOLN: 4.0000 mg | Freq: Four times a day (QID) | INTRAMUSCULAR | Status: DC | PRN

## 2021-01-28 MED ORDER — ONDANSETRON 4 MG PO TBDP: 4.0000 mg | ORAL_TABLET | Freq: Four times a day (QID) | ORAL | Status: DC | PRN

## 2021-01-28 MED ORDER — LORAZEPAM 2 MG/ML PO CONC: 1.0000 mg | ORAL | Status: DC | PRN

## 2021-01-28 MED ORDER — ADULT MULTIVITAMIN W/MINERALS CH: 1.0000 | ORAL_TABLET | Freq: Every day | ORAL | Status: DC

## 2021-01-28 MED ORDER — ACETAMINOPHEN 325 MG PO TABS: 650.0000 mg | ORAL_TABLET | Freq: Four times a day (QID) | ORAL | Status: DC | PRN

## 2021-01-28 MED ORDER — IPRATROPIUM-ALBUTEROL 0.5-2.5 (3) MG/3ML IN SOLN
3.0000 mL | Freq: Three times a day (TID) | RESPIRATORY_TRACT | Status: DC
  Administered 2021-01-28 – 2021-01-29 (×3): 3 mL via RESPIRATORY_TRACT
  Filled 2021-01-28 (×3): qty 3

## 2021-01-28 NOTE — Progress Notes (Signed)
Initial Nutrition Assessment  DOCUMENTATION CODES:  Severe malnutrition in context of chronic illness, Underweight  INTERVENTION:  Recommend liberalizing diet if possible/safe to allow patient to eat for comfort.  Add MVI with minerals daily.  NUTRITION DIAGNOSIS:  Severe Malnutrition related to chronic illness (CHF) as evidenced by severe fat depletion, severe muscle depletion, percent weight loss.  GOAL:  Patient will meet greater than or equal to 90% of their needs  MONITOR:  PO intake, Supplement acceptance, Labs, Weight trends, I & O's  REASON FOR ASSESSMENT:  Malnutrition Screening Tool    ASSESSMENT:  87 yo female with a PMH of CHF, CVA, depression, HTN, and dyslipidemia. She admitted to nausea today with associated vomiting as well as diarrhea with loose bowel movements. She admits to loss of taste and smell. She was diagnosed with COVID-19 20 days ago and admitted here 9/6-9/8. She has been having diminished appetite. 9/28 - SLP deemed PO unsafe; made NPO  Pt very sleepy on RD visit. Did not attempt to awaken patient.  Spoke with Palliative Care NP outside the room. Reports that patient started to decline after her recent COVID infection.  Per Epic, pt has lost ~12 lbs (10.8%) in the last week, which is significant and severe for the time frame.  RD does not recommended this patient for nutrition support given age.  Recommend liberalizing diet if possible to allow patient to eat for comfort if desired.  Medications: reviewed; Vitamin C, midodrine TID, zinc sulfate, D5 in LR @ 50 ml/hr  Labs: reviewed; Glucose 65 (L)  NUTRITION - FOCUSED PHYSICAL EXAM: Flowsheet Row Most Recent Value  Orbital Region Severe depletion  Upper Arm Region Severe depletion  Thoracic and Lumbar Region Severe depletion  Buccal Region Severe depletion  Temple Region Severe depletion  Clavicle Bone Region Severe depletion  Clavicle and Acromion Bone Region Severe depletion  Scapular  Bone Region Severe depletion  Dorsal Hand Severe depletion  Patellar Region Severe depletion  Anterior Thigh Region Severe depletion  Posterior Calf Region Severe depletion  Edema (RD Assessment) None  Hair Reviewed  Eyes Unable to assess  Mouth Unable to assess  Skin Reviewed  Nails Reviewed   Diet Order:   Diet Order             Diet NPO time specified  Diet effective now                  EDUCATION NEEDS:  Not appropriate for education at this time  Skin:  Skin Assessment: Skin Integrity Issues: Skin Integrity Issues:: Stage II Stage II: Pressure Injury - L posterior hip  Last BM:  PTA  Height:  Ht Readings from Last 1 Encounters:  01/27/21 5\' 6"  (1.676 m)   Weight:  Wt Readings from Last 1 Encounters:  01/28/21 44.9 kg   BMI:  Body mass index is 15.98 kg/m.  Estimated Nutritional Needs:  Kcal:  1400-1600 Protein:  70-85 grams Fluid:  >1.4 L  Derrel Nip, RD, LDN (she/her/hers) Registered Dietitian I After-Hours/Weekend Pager # in [place]

## 2021-01-28 NOTE — Assessment & Plan Note (Signed)
[place] rate in the 20s, hypoxia to the 80s on room air. - Continue supplemental oxygen

## 2021-01-28 NOTE — Consult Note (Signed)
Consultation Note Date: 01/28/2021   Patient Name: Taylor Watson  DOB: 01-21-1919  MRN: [id number]  Age / Sex: 87 y.o., female  PCP: [name], MD Referring Physician: Edwin Dada, *  Reason for Consultation: Establishing goals of care  HPI/Patient Profile: 87 y.o. female  with past medical history of CHF, CVA, HTN, dyslipidemia, depression admitted on 01/26/2021 with shortness of breath with multifocal pneumonia. Overall failure to thrive.   Clinical Assessment and Goals of Care: I met today at Ms. Butsch's bedside. Her daughter, Taylor Watson, is at bedside. We discuss her mother's poor prognosis and Taylor Watson has good understanding. When we begin to discuss a plan Taylor Watson becomes flustered and shares that her son Taylor Watson will have to help with these decisions. At this time Taylor Watson called Taylor Watson and she placed me on the phone. Taylor Watson has just had conversation with Dr. Loleta Books and with TOC. Taylor Watson confirms desire for DNR status, full comfort care, and transition home with hospice care. He wants his grandmother to be at home surrounded by family. We discussed benefits of hospice and how to give comfort medications at home to ensure Taylor Watson's comfort. I reassured them that hospice will be available for further guidance and assistance. I agree with their decision for home with hospice.   All questions/concerns addressed. Emotional support provided.   Primary Decision Maker NEXT OF KIN daughter Taylor Watson and grandson Taylor Watson    SUMMARY OF RECOMMENDATIONS   - DNR - Full comfort care - Home with hospice  Code Status/Advance Care Planning: DNR   Symptom Management:  Comfort meds prn.   Prognosis:  Days to weeks likely.   Discharge Planning: Home with Hospice   Review of Systems  Unable to perform ROS: Acuity of condition   Physical Exam Vitals and nursing note reviewed.   Constitutional:      General: She is sleeping. She is not in acute distress.    Appearance: She is cachectic. She is ill-appearing.  Cardiovascular:     Rate and Rhythm: Normal rate.  Abdominal:     General: Abdomen is flat.  Neurological:     Mental Status: She is confused.    Vital Signs: BP (!) 159/88 (BP Location: Left Arm)   Pulse 82   Temp 98.5 F (36.9 C) (Oral)   Resp 16   Ht _0  (1.676 m)   Wt 44.9 kg   SpO2 100%   BMI 15.98 kg/m  Pain Scale: 0-10   Pain Score: 0-No pain   SpO2: SpO2: 100 % O2 Device:SpO2: 100 % O2 Flow Rate: .O2 Flow Rate (L/min): 3 L/min  IO: Intake/output summary:  Intake/Output Summary (Last 24 hours) at 01/28/2021 1254 Last data filed at 01/28/2021 1027 Gross per 24 hour  Intake 116.03 ml  Output --  Net 116.03 ml    LBM: Last BM Date:  (UTA - prior to admission) Baseline Weight: Weight: 44.5 kg Most recent weight: Weight: 44.9 kg     Palliative Assessment/Data:     Time Total: 30 min  Greater than 50%  of this time was spent counseling and coordinating care related to the above assessment and plan.  Signed by: Vinie Sill, NP Palliative Medicine Team Pager # [phone] (M-F 8a-5p) Team Phone # [phone] (Nights/Weekends)

## 2021-01-28 NOTE — Telephone Encounter (Signed)
Have not received fax but pt is currently readmitted to hospital

## 2021-01-28 NOTE — Progress Notes (Addendum)
[facility]) Hospital Liaison Note  Received request from Transitions of Care Manager Isaias Cowman, RN, for hospice services at home after discharge. Chart and patient information reviewed by [facility] physician. Hospice eligibility confirmed.  Spoke with grandson Fabian November to initiate education related to hospice philosophy, services and team approach to care. Patient/family verbalized understanding of information provided. Per discussion, the plan for discharge is on 9.29.22 via EMS.  DME needs discussed. Patient has the following equipment in the home: O2, w/c and rolling walker. Patient/family requests the following equipment for delivery: hospital bed and OBT. Address has been verified and is correct in the chart. Fabian November is the family contact to arrange time of equipment delivery.   Please send signed and completed DNR home with patient/family. Please provide prescriptions at discharge as needed to ensure ongoing symptom management.   [facility] information and contact numbers given to family. Above information shared with [place].   Please do not hesitate to call with any hospice related questions or concerns.   Thank you for the opportunity to participate in this patient's care.   Bobbie "Loren Racer, RN, BSN [facility] Liaison [phone]

## 2021-01-28 NOTE — Progress Notes (Signed)
Spoke with grandson. Failure to respond to therapy and terminal nature of illness discussed.  Nature of hospice explained, and likelihood of death within next few days discussed.  Grandson would like for her to be able to go home, and so we will arrange equipment and go home.

## 2021-01-28 NOTE — Assessment & Plan Note (Signed)
As evidenced by severe loss of subcutaneous muscle mass and fat.

## 2021-01-28 NOTE — Assessment & Plan Note (Signed)
Appears euvolemic  

## 2021-01-28 NOTE — Assessment & Plan Note (Signed)
Heart rate controlled.  Given terminal status, anticoagulation not indicated.

## 2021-01-28 NOTE — TOC Initial Note (Addendum)
Transition of Care [facility]) - Initial/Assessment Note    Patient Details  Name: Taylor Watson MRN: [id number] Date of Birth: Jan 16, 1919  Transition of Care [facility]) CM/SW Contact:    Taylor Sessions, RN Phone Number: 01/28/2021, 3:49 PM  Clinical Narrative:                  Patient admitted from home with aspiration PNA Patient lives at home with daughter and family  Patient was assessed by [facility] 9/8  See note below  "Patient admitted to the hospital with [place].  RNCM was able to speak with patient's grandson via phone.  Taylor Watson, reports that patient lives with his mother, [name].  Patient is on chronic O2 at home 3-4 L.  She walks with a walker at baseline and can even prepare simple meals like scramble eggs for breakfast.   PT is currently recommending SNF, Taylor Watson feels that patient would be better off at home with home health services.  Patient was able to move with minimal to no assist from PT and walked 12 feet, which would be from her room to the living area in the home.   RNCM will arranged home health services.  [facility] unable to accept referral, referral then given to [facility] with [facility]  "   Patient active with [facility] MD recommending inpatient hospice  Daughter defers me to grandson Taylor Watson.  Spoke Taylor Watson he is in agreement for hospice home and would like the facility in [place].   Referral made Taylor Watson with [employer]   4pm update  Received return call from grandson.  If possible he would like to try to take the patient home.  States they have O2, RW, and WC in the home.  Would need a hospital bed.  Taylor Watson with Manufacturing [employer] updated   Expected Discharge Plan: [place] Barriers to Discharge: Continued Medical Work up   Patient Goals and CMS Choice        Expected Discharge Plan and Services Expected Discharge Plan: [place]     Post Acute Care Choice: [place]  arrangements for the past 2 months: Single Family Home                           HH Arranged: RN, PT, OT          Prior Living Arrangements/Services Living arrangements for the past 2 months: Single Family Home Lives with:: Adult Children Patient language and need for interpreter reviewed:: Yes        Need for Family Participation in Patient Care: Yes (Comment) Care giver support system in place?: Yes (comment) Current home services: DME Criminal Activity/Legal Involvement Pertinent to Current Situation/Hospitalization: No - Comment as needed  Activities of Daily Living Home Assistive Devices/Equipment: Environmental consultant (specify type) (rollaider) ADL Screening (condition at time of admission) Patient's cognitive ability adequate to safely complete daily activities?: No Is the patient deaf or have difficulty hearing?: Yes Does the patient have difficulty seeing, even when wearing glasses/contacts?: Yes Does the patient have difficulty concentrating, remembering, or making decisions?: Yes Patient able to express need for assistance with ADLs?: Yes Does the patient have difficulty dressing or bathing?: Yes Independently performs ADLs?: No Does the patient have difficulty walking or climbing stairs?: Yes Weakness of Legs: Both Weakness of Arms/Hands: None  Permission Sought/Granted                  Emotional Assessment  Orientation: : Oriented to Self      Admission diagnosis:  Aspiration pneumonia ([place]) [J69.0] Aspiration pneumonia of both lungs, unspecified aspiration pneumonia type, unspecified part of lung ([place]) [J69.0] Patient Active Problem List   Diagnosis Date Noted   Hypocalcemia 01/28/2021   Aspiration pneumonia ([name]) 01/26/2021   Protein-calorie malnutrition, severe 01/18/2021   Pressure injury of skin 01/17/2021   E. coli UTI 01/14/2021   Hypotension 01/13/2021   Acute kidney injury superimposed on CKD ([place]) 01/13/2021   Severe malnutrition ([place])  01/08/2021   Encephalopathy due to COVID-19 virus 01/07/2021   Diarrhea 01/06/2021   COVID-19 virus infection 01/06/2021   Hypoglycemia without diagnosis of diabetes mellitus 01/06/2021   COPD (chronic obstructive pulmonary disease) ([place]) 11/03/2020   Aortic atherosclerosis ([place]) 11/02/2020   Hypertensive kidney disease with CKD (chronic kidney disease) stage V ([place]) 11/02/2020   CKD (chronic kidney disease), stage III ([place]) 11/02/2020   Incontinence 11/02/2020   Conjunctivitis 10/03/2020   Discoloration of skin 07/25/2020   Bad odor of urine 05/11/2020   Bradycardia 03/29/2020   Sebaceous cyst 03/29/2020   Weakness 03/29/2020   UTI (urinary tract infection) 03/19/2020   NSTEMI (non-ST elevated myocardial infarction) ([place]) 03/19/2020   Chronic diastolic CHF (congestive heart failure) ([place])    B12 deficiency 10/13/2019   Skin lesion 08/19/2019   Acute exacerbation of CHF (congestive heart failure) ([place]) 07/22/2019   Acute respiratory failure with hypoxia ([place]) 07/22/2019   Leg swelling 07/22/2019   AF (paroxysmal atrial fibrillation) ([place]) 07/22/2019   Fall at home, initial encounter 07/22/2019   Volume overload 07/19/2019   Hypertensive urgency 05/18/2019   Elevated troponin 05/18/2019   Hypokalemia 05/18/2019   URI (upper respiratory infection) 08/26/2018   Headache 07/17/2017   SOB (shortness of breath) 03/28/2016   Near syncope 03/01/2016   Anemia 10/28/2015   Lower GI bleed    Goals of care, counseling/discussion    Blood loss anemia 06/01/2015   GERD (gastroesophageal reflux disease)    Mild depression ([place])    Unsteady gait 02/08/2015   Schwannoma 10/07/2013   Rectal bleeding 04/03/2013   Light headedness 01/19/2013   Loss of weight 10/22/2012   Hypercholesterolemia 10/22/2012   History of CVA (cerebrovascular accident) 10/22/2012   Anxiety 10/22/2012   PCP:  Taylor Pheasant, MD Pharmacy:   [facility], [address] [place] [address] Phone: [phone] Fax: [phone]  [id number] [place] [place] - [address] [place] [address] Phone: [phone] Fax: [phone]     Social Determinants of Health (SDOH) Interventions    Readmission Risk Interventions Readmission Risk Prevention Plan 01/28/2021  Transportation Screening Complete  Social Work Consult for [place] Planning/Counseling Complete  Palliative Care Screening Complete  Medication Review Press photographer) Complete  Some recent data might be hidden

## 2021-01-28 NOTE — Assessment & Plan Note (Signed)
Speech therapy evaluated today, given her encephalopathy, she has very high risk to aspirate.  She is also not able to safely keep up with her fluid nutrition needs. - Continue IV fluids - Continue Unasyn

## 2021-01-28 NOTE — Assessment & Plan Note (Signed)
-   Supplemented and resolved 

## 2021-01-28 NOTE — Progress Notes (Signed)
Progress Note    Taylor Watson   MMH:[id number]  DOB: 06/30/1918  DOA: 01/26/2021     2 Date of Service: 01/28/2021     Subjective:  No change in [place] and No new fever, improvement in respirations, vomiting, or diarrhea.  No pain complaints.  Hospital Problems * Aspiration pneumonia [facility]) Speech therapy evaluated today, given her encephalopathy, she has very high risk to aspirate.  She is also not able to safely keep up with her fluid nutrition needs. - Continue IV fluids - Continue Unasyn  Encephalopathy due to COVID-19 virus The patient has had encephalopathy since her last admission, it is severe, she is unable to swallow.  With regard to COVID, she is outside the isolation period.  So far no improvement at all with IV fluids and antibiotics. -Consult hospice   Acute respiratory failure with hypoxia ([place]) [place] rate in the 20s, hypoxia to the 80s on room air. - Continue supplemental oxygen  AF (paroxysmal atrial fibrillation) (HCC) Heart rate controlled.  Given terminal status, anticoagulation not indicated.  Hypocalcemia Supplemented and resolved  Protein-calorie malnutrition, severe As evidenced by severe loss of subcutaneous muscle mass and fat.  Hypotension Due to failure to thrive.  Blood pressure stable - Continue midodrine  Chronic diastolic CHF (congestive heart failure) (HCC) Appears euvolemic   Elevated troponin This is demand ishcemia due to respiratory failure.  No ischemic work up necessary.       Objective Vital signs were reviewed and unremarkable.  Vitals:   01/28/21 0500 01/28/21 0725 01/28/21 0802 01/28/21 0900  BP:   (!) 159/88   Pulse:   82   Resp:   16   Temp:      TempSrc:      SpO2:  96% (!) 84% 100%  Weight: 44.9 kg     Height:       44.9 kg  Exam Physical Exam Constitutional:      General: She is sleeping. She is not in acute distress.    Appearance: She is cachectic.  HENT:     Nose: No nasal deformity  or rhinorrhea.     Mouth/Throat:     Lips: Pink. No lesions.     Mouth: No oral lesions.     Dentition: Normal dentition.     Pharynx: No posterior oropharyngeal erythema.  Eyes:     General: Lids are normal. Gaze aligned appropriately.     Conjunctiva/sclera: Conjunctivae normal.  Cardiovascular:     Rate and Rhythm: Normal rate and regular rhythm.     Pulses:          Radial pulses are 2+ on the right side and 2+ on the left side.     Heart sounds: Normal heart sounds, S1 normal and S2 normal. No murmur heard. Pulmonary:     Effort: Pulmonary effort is normal. No respiratory distress.     Breath sounds: Examination of the right-middle field reveals decreased breath sounds. Examination of the left-middle field reveals decreased breath sounds. Decreased breath sounds present. No wheezing or rales.  Abdominal:     General: There is no distension.     Palpations: Abdomen is soft.     Tenderness: There is no abdominal tenderness. There is no guarding or rebound.  Musculoskeletal:     Right lower leg: No edema.     Left lower leg: No edema.  Skin:    General: Skin is warm and dry.     Findings: No lesion or rash.  Neurological:     Mental Status: She is disoriented.     Cranial Nerves: Cranial nerves are intact.     Motor: Weakness present.  Psychiatric:        Attention and Perception: She is inattentive.        Speech: She is noncommunicative.      Labs / Other Information My review of labs, imaging, notes and other tests is significant for Pneumonia with debris in the airways on chest imaging, elevated procalcitonin, normalized calcium, and minimally elevated troponin.     Time spent: 25 minutes [facility] 01/28/2021, 3:38 PM

## 2021-01-28 NOTE — Assessment & Plan Note (Addendum)
The patient has had encephalopathy since her last admission, it is severe, she is unable to swallow.  With regard to COVID, she is outside the isolation period.  So far no improvement at all with IV fluids and antibiotics. -Consult hospice

## 2021-01-28 NOTE — Evaluation (Signed)
Clinical/Bedside Swallow Evaluation Patient Details  Name: Taylor Watson MRN: [id number] Date of Birth: June 25, 1918  Today's Date: 01/28/2021 Time: SLP Start Time (ACUTE ONLY): 47 SLP Stop Time (ACUTE ONLY): 1045 SLP Time Calculation (min) (ACUTE ONLY): 60 min  Past Medical History:  Past Medical History:  Diagnosis Date   Allergy    Anemia    Arrhythmia    CHF (congestive heart failure) (HCC)    CVA (cerebral vascular accident) ([place])    Depression    GERD (gastroesophageal reflux disease)    History of blood transfusion    History of kidney stones    Hx: UTI (urinary tract infection)    Hypercholesterolemia    Hypertension    Past Surgical History:  Past Surgical History:  Procedure Laterality Date   CHOLECYSTECTOMY  1984   NECK LESION BIOPSY  2015   Followed by Dr. Richardson Landry   HPI:  Pt is a 87 y.o. African-American female with medical history significant for Dementia per chart, CHF, CVA, depression, hypertension and dyslipidemia, who presented to the ER with acute onset of dyspnea with increased work of breathing and associated cough productive of thick mucus.  She has been having difficulty swallowing her pills and occasionally gagging.  Her sputum has been occasionally brownish.  She usually uses 2 L O2 by nasal cannula.  She admitted to nausea today with associated vomiting as well as diarrhea with loose bowel movements.  She admits to loss of taste and smell.  She was diagnosed with COVID-19 20 days ago and admitted here 9/6-9/8.  She has been having diminished appetite.  Patient initially positive for COVID on 9/7 admission, remains positive this admission (9/27). Patient out of isolation window, OK to d/c isolation per MD this admit.  She was just recently at the ED again on 01/13/2021 and 01/22/2021 per chart.    Assessment / Plan / Recommendation  Clinical Impression  Pt seen for BSE this morning. She was awake but poorly attentive during po tasks; fidgity w/ UEs in  bed and did not maintain a full upright/midline positioning post support w/ positioning by NSG/SLP. Pt was mostly nonverbal w/ muttered phonations and did not follow instrucations despite cues. Suspect this presentation is directly related to pt's Dx of Dementia per chart. She appears to present w/ oropharyngeal phase dysphagia in light of declined Cognitive status; Baseline Dementia. This can impact her overall awareness/timing of swallowing and safety during po tasks which increases risk for aspiration, choking. She required MOD+ verbal/tactile/visual cues during po tasks.     Pt fed trials of single ice chips, purees, and tsps of Nectar liquids w/ no overt clinical s/s of aspiration; no immediate, overt coughing, and no decline in respiratory status during/post trials.   HOWEVER, pt's Oral phase including bolus awareness and Oral Prep w/ trials then bolus management for timely manipulation and A-P transfer for swallowing was Significantly declined -- ANY Oral phase dysphagia can greatly increase risk for aspiration as well as reduced ability to meet nutritional needs adequate.  Pt's Oral phase of swallowing appeared heavily impacted her Cognitive decline, Dementia. She required MOD++ cues and Supervision/Assessment by this SLP to ascertain oral clearing and a pharyngeal swallow w/ EVERY bolus -- CRUSHED Pills gven w/ NSG present alternating puree/Nectar and MOD++ cues to swallow/clear(laryngeal palpation given). Pt could/did not attend to feed self. OM Exam limited d/t Cognitive status but no unilateral weakness noted. Lingual Bunching behavior noted.     D/t pt's presentation of declined Cognitive status  and poor awareness w/ oral inatke, and her risk for aspiration, recommend NPO status w/ frequent oral care for hygiene and stimulation of swallowing; necessary Pills CRUSHED in Puree and given w/ strict NSG Supervision for swallowing/clearing. HOLD any po if pt is not attending.  MD/NSG updated. ST services  will monitor status; recommend f/u w/ MD/Palliative Care for [place] and education w/ family re: impact of Cognitive decline/Dementia on swallowing. Largely suspect that pt's Dementia could hamper upgrade of diet. Precautions for oral care posted in room. SLP Visit Diagnosis: Dysphagia, oropharyngeal phase (R13.12) (Cognitive decline)    Aspiration Risk  Moderate aspiration risk;Risk for inadequate nutrition/hydration    Diet Recommendation   NPO status w/ frequent oral care for hygiene and stimulation of swallowing  Medication Administration: Crushed with puree (w/ strict precautions)    Other  Recommendations Recommended Consults:  (Palliative care; Dietician) Oral Care Recommendations: Oral care before and after PO;Oral care QID;Staff/trained caregiver to provide oral care Other Recommendations:  (TBD)    Recommendations for follow up therapy are one component of a multi-disciplinary discharge planning process, led by the attending physician.  Recommendations may be updated based on patient status, additional functional criteria and insurance authorization.  Follow up Recommendations  (TBD)      Frequency and Duration min 3x week  2 weeks       Prognosis Prognosis for Safe Diet Advancement: Guarded Barriers to Reach Goals: Cognitive deficits;Language deficits;Time post onset;Severity of deficits;Behavior      Swallow Study   General Date of Onset: 01/28/21 HPI: Pt is a 87 y.o. African-American female with medical history significant for Dementia per chart, CHF, CVA, depression, hypertension and dyslipidemia, who presented to the ER with acute onset of dyspnea with increased work of breathing and associated cough productive of thick mucus.  She has been having difficulty swallowing her pills and occasionally gagging.  Her sputum has been occasionally brownish.  She usually uses 2 L O2 by nasal cannula.  She admitted to nausea today with associated vomiting as well as diarrhea with  loose bowel movements.  She admits to loss of taste and smell.  She was diagnosed with COVID-19 20 days ago and admitted here 9/6-9/8.  She has been having diminished appetite.  Patient initially positive for COVID on 9/7 admission, remains positive this admission (9/27). Patient out of isolation window, OK to d/c isolation per MD this admit.  She was just recently at the ED again on 01/13/2021 and 01/22/2021 per chart. Type of Study: Bedside Swallow Evaluation Previous Swallow Assessment: none Diet Prior to this Study: Dysphagia 1 (puree);Thin liquids Temperature Spikes Noted: No (wbc 5.5) Respiratory Status: Nasal cannula (3-10L) History of Recent Intubation: No Behavior/Cognition: Pleasant mood;Confused;Distractible;Requires cueing;Doesn't follow directions (Awake) Oral Cavity Assessment: Dry (min) Oral Care Completed by SLP: Yes (attempted) Oral Cavity - Dentition: Missing dentition Vision:  (n/a) Self-Feeding Abilities: Total assist Patient Positioning: Upright in bed;Postural control adequate for testing (pt was fidgity and moving about; on Left side more w/ legs drawn up) Baseline Vocal Quality:  (mumbled phonations) Volitional Cough: Cognitively unable to elicit Volitional Swallow: Unable to elicit    Oral/Motor/Sensory Function Overall Oral Motor/Sensory Function: Generalized oral weakness (no overt unilateral weakness; lingual bunching/forward)   Ice Chips Ice chips: Impaired Presentation: Spoon (fed; 3 trials) Oral Phase Impairments: Poor awareness of bolus;Reduced lingual movement/coordination Oral Phase Functional Implications: Prolonged oral transit Pharyngeal Phase Impairments: Suspected delayed Swallow   Thin Liquid Thin Liquid: Not tested    Nectar Thick Nectar  Thick Liquid: Impaired Presentation: Spoon (fed; 10 trials) Oral Phase Impairments: Reduced lingual movement/coordination;Poor awareness of bolus Oral phase functional implications: Prolonged oral transit;Oral  holding Pharyngeal Phase Impairments: Suspected delayed Swallow   Honey Thick Honey Thick Liquid: Not tested   Puree Puree: Impaired Presentation: Spoon (9 trials) Oral Phase Impairments: Reduced lingual movement/coordination;Poor awareness of bolus Oral Phase Functional Implications: Prolonged oral transit;Oral holding Pharyngeal Phase Impairments: Suspected delayed Swallow   Solid     Solid: Not tested        Orinda Kenner, MS, [place] [facility] [phone] [facility] 01/28/2021,12:20 PM

## 2021-01-28 NOTE — Assessment & Plan Note (Signed)
Due to failure to thrive.  Blood pressure stable - Continue midodrine

## 2021-01-29 ENCOUNTER — Telehealth: Payer: Self-pay | Admitting: Internal Medicine

## 2021-01-29 DIAGNOSIS — I5032 Chronic diastolic (congestive) heart failure: Secondary | ICD-10-CM | POA: Diagnosis not present

## 2021-01-29 DIAGNOSIS — J9601 Acute respiratory failure with hypoxia: Secondary | ICD-10-CM | POA: Diagnosis not present

## 2021-01-29 DIAGNOSIS — J690 Pneumonitis due to inhalation of food and vomit: Secondary | ICD-10-CM | POA: Diagnosis not present

## 2021-01-29 DIAGNOSIS — I480 Paroxysmal atrial fibrillation: Secondary | ICD-10-CM | POA: Diagnosis not present

## 2021-01-29 MED ORDER — ONDANSETRON 4 MG PO TBDP: 4.0000 mg | ORAL_TABLET | Freq: Four times a day (QID) | ORAL | 0 refills | Status: AC | PRN

## 2021-01-29 MED ORDER — AMOXICILLIN-POT CLAVULANATE 500-125 MG PO TABS
1.0000 | ORAL_TABLET | Freq: Two times a day (BID) | ORAL | Status: DC
  Filled 2021-01-29 (×2): qty 1

## 2021-01-29 MED ORDER — LORAZEPAM 1 MG PO TABS: 1.0000 mg | ORAL_TABLET | ORAL | 0 refills | Status: AC | PRN

## 2021-01-29 MED ORDER — MORPHINE SULFATE (CONCENTRATE) 10 MG/0.5ML PO SOLN: 5.0000 mg | ORAL | 0 refills | Status: AC | PRN

## 2021-01-29 MED ORDER — BIOTENE DRY MOUTH MT LIQD: 15.0000 mL | OROMUCOSAL | 0 refills | Status: AC | PRN

## 2021-01-29 MED ORDER — AMOXICILLIN-POT CLAVULANATE 500-125 MG PO TABS: 1.0000 | ORAL_TABLET | Freq: Two times a day (BID) | ORAL | 0 refills | Status: AC

## 2021-01-29 NOTE — Telephone Encounter (Signed)
Needing to know if Dr Nicki Reaper agrees that the Patient has a life prognosis of 6 months or less?   Wanting to know if Dr Nicki Reaper will be attending while under hospice care?

## 2021-01-29 NOTE — Telephone Encounter (Signed)
If they discharge to hospice home, are you still the attending?

## 2021-01-29 NOTE — TOC Transition Note (Addendum)
Transition of Care [facility]) - CM/SW Discharge Note   Patient Details  Name: Taylor Watson MRN: [id number] Date of Birth: 1918-11-06  Transition of Care [facility] CM/SW Contact:  [name], RN Phone Number: 01/29/2021, 1:16 PM   Clinical Narrative:     Patient to discharge home with hospice services today Grandson at home.   Per Anderson Malta with [facility] bed currently being set up  EMS packet and signed DNR on chart  [facility] transport called for [place] with hospice  Barriers to Discharge: Continued Medical Work up   Patient Goals and CMS Choice        Discharge Placement                       Discharge Plan and Services     Post Acute Care Choice: [place] Arranged: RN, PT, OT          Social Determinants of Health (SDOH) Interventions     Readmission Risk Interventions Readmission Risk Prevention Plan 01/28/2021  Transportation Screening Complete  Social Work Consult for [place] Planning/Counseling [place] Screening Complete  Medication Review [employer]) Complete  Some recent data might be hidden

## 2021-01-29 NOTE — Telephone Encounter (Signed)
Spoke with [facility]. Patient is being discharged home with hospice. Advised that Dr Nicki Reaper agreed to be the attending.

## 2021-01-29 NOTE — Discharge Summary (Signed)
Physician Discharge Summary  Taylor Watson HGD:[id number] DOB: 1918/05/12 DOA: 01/26/2021  PCP: Taylor Pheasant, MD  Admit date: 01/26/2021 Discharge date: 01/29/2021  Admitted From: Home  Disposition:  Home with [place]         Brief/Interim Summary: Taylor Watson is a 87 y.o. F with dCHF, hx CVA, HTN and recent COVID who presented with decreased mentation, weakness, and cough productive of thick sputum.    In the ER, chest imaging showed multifocal pneumonia with debris in the trachea suggesting aspiration.     PRINCIPAL HOSPITAL DIAGNOSIS: Aspiration pneumonia due to COVID encephalopathy    Discharge Diagnoses:  Aspiration pneumonia COVID encephalopathy Acute respiratory failure with hypoxia Hypocalcemia New onset paroxysmal atrial fibrillation Protein calorie malnutrition Hypertension Chronic diastolic CHF  The patient was admitted and put on IV antibiotics, IV fluids.  COVID encephalopathy in a patient this age, with this degree of frailty and cachexia and malnutrition is not treatable, not surprisingly lingers long after initial COVID pneumonitis resolved, and is almost certainly terminal.  The patient had waxing and waning alertness here, was mostly very somnolent, and was only able to take a few bites of food over the course of 3 days.  Her inability to keep up with fluid and nutrition needs as discussed with family, as well as the very high degree of certainty that she would never be able to do so.  Given her degree of pre-existing malnutrition, severe loss of subcutaneous muscle mass and fat, there is a high degree of certainty that she will very soon become dehydrated, and have renal failure and pass away.  Hospice was consulted to help the family with end-of-life management at home.     Discharge Instructions  Discharge Instructions     Discharge instructions   Complete by: As directed    From Dr. Loleta Books: Your grandmother's brain has  been affected by the COVID to the extent that she cannot safely eat or drink enough to sustain herself.  What food she does eat will likely cause another pneumonia.  In this situation (when a person has a medical illness that is not correctable, not treatable, and at her age, likely going to be terminal within a few weeks or months), we recommend Hospice, to support her at the end of life.  For any pain that she has, give morphine 5 mg (0.25 mL in a small syringe) For any anxiety, try lorazepam/Ativan For nausea, try ondansetron/Zofran   Use Biotene for keeping her mouth moist  Take Augmentin (an antibiotic) for a few more days, I think this will clear the current pneumonia (from aspirating food into her lungs) and help her feel a bit better over the next week at least.   Discharge wound care:   Complete by: As directed    Cover wound with barrier dressing   For home use only DME Hospital bed   Complete by: As directed    Length of Need: 6 Months   Patient has (list medical condition): failure to thrive   Bed type: Semi-electric   For home use only DME Overbed table   Complete by: As directed       Allergies as of 01/29/2021       Reactions   Dyazide [hydrochlorothiazide W-triamterene] Other (See Comments)   Unknown reaction   Toprol Xl [metoprolol Tartrate] Other (See Comments)   unknown   Monopril [fosinopril] Cough   Verapamil Other (See Comments)   headache  Medication List     STOP taking these medications    ascorbic acid 250 MG tablet Commonly known as: VITAMIN C   feeding supplement Liqd   lactobacillus Pack   midodrine 5 MG tablet Commonly known as: PROAMATINE   zinc sulfate 220 (50 Zn) MG capsule       TAKE these medications    amoxicillin-clavulanate 500-125 MG tablet Commonly known as: AUGMENTIN Take 1 tablet (500 mg total) by mouth every 12 (twelve) hours.   antiseptic oral rinse Liqd Apply 15 mLs topically as needed for dry mouth.    LORazepam 1 MG tablet Commonly known as: ATIVAN Take 1 tablet (1 mg total) by mouth every 4 (four) hours as needed for anxiety.   morphine CONCENTRATE 10 MG/0.5ML Soln concentrated solution Take 0.25 mLs (5 mg total) by mouth every 2 (two) hours as needed for moderate pain (or dyspnea).   ondansetron 4 MG disintegrating tablet Commonly known as: ZOFRAN-ODT Take 1 tablet (4 mg total) by mouth every 6 (six) hours as needed for nausea.               Durable Medical Equipment  (From admission, onward)           Start     Ordered   01/29/21 0000  For home use only DME Hospital bed       Question Answer Comment  Length of Need 6 Months   Patient has (list medical condition): failure to thrive   Bed type Semi-electric      01/29/21 1017   01/29/21 0000  For home use only DME Overbed table        01/29/21 1017              Discharge Care Instructions  (From admission, onward)           Start     Ordered   01/29/21 0000  Discharge wound care:       Comments: Cover wound with barrier dressing   01/29/21 1017            Follow-up Information     [facility] Follow up.   Specialty: Hospice and Palliative Medicine Contact information: [place] [address] [phone]               Allergies  Allergen Reactions   Dyazide [Hydrochlorothiazide W-Triamterene] Other (See Comments)    Unknown reaction   Toprol Xl [Metoprolol Tartrate] Other (See Comments)    unknown   Monopril [Fosinopril] Cough   Verapamil Other (See Comments)    headache       Procedures/Studies: DG Chest 2 View  Result Date: 01/22/2021 CLINICAL DATA:  Recent COVID infection, altered mental status EXAM: CHEST - 2 VIEW COMPARISON:  Chest radiograph 01/13/2021 FINDINGS: The cardiomediastinal silhouette is grossly stable, with unchanged cardiomegaly and marked tortuosity of the densely calcified thoracic aorta. The lungs are mildly  hyperinflated and hyperlucent raising suspicion for underlying COPD. Aeration of the lung bases has improved since 01/13/2021. There is no new focal airspace disease. There is no overt pulmonary edema. The left pleural effusion seen on the prior study has decreased/resolved. There is no significant right effusion. There is no pneumothorax. There is no acute osseous abnormality. IMPRESSION: Improved aeration of the lung bases with resolved left pleural effusion. No new focal airspace disease. Electronically Signed   By: Valetta Mole M.D.   On: 01/22/2021 16:37   CT HEAD WO CONTRAST (5MM)  Result Date:  01/22/2021 CLINICAL DATA:  One 87 year old female presents with mental status changes of unknown cause. EXAM: CT HEAD WITHOUT CONTRAST TECHNIQUE: Contiguous axial images were obtained from the base of the skull through the vertex without intravenous contrast. COMPARISON:  March 19, 2020. FINDINGS: Brain: No evidence of acute infarction, hemorrhage, hydrocephalus, extra-axial collection or mass lesion/mass effect. Signs of extensive atrophy and with evidence of chronic microvascular ischemic change as before. Vascular: No hyperdense vessel or unexpected calcification. Skull: Normal. Negative for fracture or focal lesion. 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 Sinuses/Orbits: Scattered opacification of ethmoid sinuses. No acute findings relative to visualized orbits. Sinuses are incompletely imaged. Other: None IMPRESSION: No acute intracranial abnormality. Signs of extensive atrophy and with evidence of chronic microvascular ischemic change as before. Scattered opacification of ethmoid sinuses raising the question of mild sinus disease. No air-fluid levels in the sinuses. Sinuses are incompletely imaged. Electronically Signed   By: Zetta Bills M.D.   On: 01/22/2021 14:50   CT Angio Chest PE W and/or Wo Contrast  Result Date: 01/26/2021 CLINICAL DATA:  Pt is a week post covid  and called EMS for SOB. EXAM: CT ANGIOGRAPHY CHEST WITH CONTRAST TECHNIQUE: Multidetector CT imaging of the chest was performed using the standard protocol during bolus administration of intravenous contrast. Multiplanar CT image reconstructions and MIPs were obtained to evaluate the vascular anatomy. CONTRAST:  12mL OMNIPAQUE IOHEXOL 350 MG/ML SOLN COMPARISON:  None. FINDINGS: Cardiovascular: Satisfactory opacification of the pulmonary arteries to the segmental level. Vascular cutoff of the segmental and subsegmental left lower and right lower lobe pulmonary arteries likely due to underlying lung findings with redistribution of the vascular flow. No definite filling defect suggestive of pulmonary embolus. Main pulmonary artery is normal in caliber. Normal heart size. No significant pericardial effusion. The thoracic aorta is normal in caliber. Severe atherosclerotic plaque of the thoracic aorta. Four-vessel coronary artery calcifications. Bulky mitral annular calcifications. Mediastinum/Nodes: No enlarged mediastinal, hilar, or axillary lymph nodes. Debris noted within the trachea. Thyroid gland and esophagus demonstrate no significant findings. Lungs/Pleura: Paraseptal and centrilobular at least moderate emphysematous changes. Left lower lobe peripheral reticulations and ground-glass airspace opacities similar findings within the right lower lobe to a lesser extent as well as lingula. No pulmonary nodule. No pulmonary mass. No pleural effusion. No pneumothorax. Upper Abdomen: No acute abnormality. Musculoskeletal: No abdominal wall hernia or abnormality. No suspicious lytic or blastic osseous lesions. No acute displaced fracture. Multilevel degenerative changes of the spine. Review of the MIP images confirms the above findings. IMPRESSION: 1. No pulmonary embolus. 2. Bilateral lower lobe and lingular peripheral reticulations and ground-glass airspace opacities consistent with infection/inflammation. 3. Debris  noted within the trachea. 4. Aortic Atherosclerosis (ICD10-I70.0) and Emphysema (ICD10-J43.9). Electronically Signed   By: Iven Finn M.D.   On: 01/26/2021 20:40   DG Chest Portable 1 View  Result Date: 01/26/2021 CLINICAL DATA:  Shortness of breath, COVID 1 week ago EXAM: PORTABLE CHEST 1 VIEW COMPARISON:  01/22/2021 FINDINGS: Cardiomegaly. Pulmonary hyperinflation and emphysema. Mild diffuse bilateral interstitial pulmonary opacity. Probable small pleural effusions. IMPRESSION: Cardiomegaly and mild diffuse bilateral interstitial pulmonary opacity, which may reflect mild edema and or infection, including COVID airspace disease. Electronically Signed   By: Eddie Candle M.D.   On: 01/26/2021 17:50   DG Chest Portable 1 View  Result Date: 01/13/2021 CLINICAL DATA:  Weakness, COVID EXAM: PORTABLE CHEST 1 VIEW COMPARISON:  01/06/2021 FINDINGS: Bilateral lower lobe airspace opacities appear increased since prior study.  Mild cardiomegaly. Diffuse interstitial prominence is stable. Small left effusion. No pneumothorax. Aortic atherosclerosis. IMPRESSION: Increasing bibasilar atelectasis or infiltrates. Small left effusion. Stable chronic interstitial disease. Aortic atherosclerosis. Electronically Signed   By: Rolm Baptise M.D.   On: 01/13/2021 17:23   DG Chest Portable 1 View  Result Date: 01/06/2021 CLINICAL DATA:  Shortness of breath. EXAM: PORTABLE CHEST 1 VIEW COMPARISON:  03/19/2020 FINDINGS: The lungs are clear without focal pneumonia, edema, pneumothorax or pleural effusion. Interstitial markings are diffusely coarsened with chronic features. The cardio pericardial silhouette is enlarged. The visualized bony structures of the thorax show no acute abnormality. Telemetry leads overlie the chest. IMPRESSION: Stable.  No acute cardiopulmonary findings. Electronically Signed   By: Misty Stanley M.D.   On: 01/06/2021 13:42      Subjective: Patient is slightly more alert today.  She denies  complaints, headache, chest pain, dyspnea, abdominal pain.  She has no appetite, and has a cough.  Discharge Exam: Vitals:   01/28/21 2042 01/29/21 0832  BP:  126/77  Pulse:  67  Resp:  19  Temp:  (!) 97.5 F (36.4 C)  SpO2: 91% 99%   Vitals:   01/28/21 0900 01/28/21 2002 01/28/21 2042 01/29/21 0832  BP:  (!) 100/58  126/77  Pulse:  74  67  Resp:  14  19  Temp:  97.6 F (36.4 C)  (!) 97.5 F (36.4 C)  TempSrc:  Oral  Oral  SpO2: 100% (!) 88% 91% 99%  Weight:      Height:        General: Cachectic elderly female, lying in bed, awake but sleepy, in no obvious distress, sluggish Cardiovascular: RRR, nl S1-S2, no murmurs appreciated.   No LE edema.   Respiratory: Normal respiratory rate and rhythm.  CTAB without rales or wheezes.  Very effort shallow. Abdominal: Abdomen soft and non-tender.  No distension or HSM.   MSK: Severe loss of subcutaneous muscle mass and fat. Neuro/Psych: Strength symmetric in upper and lower extremities.  Somnolent, not oriented to person, place, or time.   The results of significant diagnostics from this hospitalization (including imaging, microbiology, ancillary and laboratory) are listed below for reference.     Microbiology: Recent Results (from the past 240 hour(s))  Expectorated Sputum Assessment w Gram Stain, Rflx to Resp Cult     Status: None   Collection Time: 01/26/21  2:50 PM   Specimen: Urine, Random; Sputum  Result Value Ref Range Status   Specimen Description URINE, RANDOM  Final   Special Requests NONE  Final   Sputum evaluation   Final    THIS SPECIMEN IS ACCEPTABLE FOR SPUTUM CULTURE Performed at [facility], [address] [place], [place] [address]    Report Status 01/27/2021 FINAL  Final  Culture, Respiratory w Gram Stain     Status: None (Preliminary result)   Collection Time: 01/26/21  2:50 PM   Specimen: Urine, Random  Result Value Ref Range Status   Specimen Description   Final    URINE,  RANDOM Performed at [facility], [address], [place], [place] [address]    Special Requests   Final    NONE Reflexed from [id number] Performed at [facility], [place],  [address]    Gram Stain   Final    MODERATE SQUAMOUS EPITHELIAL CELLS PRESENT MODERATE WBC PRESENT, PREDOMINANTLY MONONUCLEAR ABUNDANT GRAM POSITIVE COCCI FEW GRAM NEGATIVE RODS RARE GRAM POSITIVE RODS    Culture  Final    MODERATE Normal respiratory flora-no Staph aureus or Pseudomonas seen Performed at [facility] [address], [place], [place] [address]    Report Status PENDING  Incomplete  Resp Panel by RT-PCR (Flu A&B, Covid) Nasopharyngeal Swab     Status: Abnormal   Collection Time: 01/27/21  8:06 AM   Specimen: Nasopharyngeal Swab; Nasopharyngeal(NP) swabs in vial transport medium  Result Value Ref Range Status   SARS Coronavirus 2 by RT PCR POSITIVE (A) NEGATIVE Final    Comment: RESULT CALLED TO, READ BACK BY AND VERIFIED WITH: REINA TALJOUR 01/27/21 1045 KLW (NOTE) SARS-CoV-2 target nucleic acids are DETECTED.  The SARS-CoV-2 RNA is generally detectable in upper respiratory specimens during the acute phase of infection. Positive results are indicative of the presence of the identified virus, but do not rule out bacterial infection or co-infection with other pathogens not detected by the test. Clinical correlation with patient history and other diagnostic information is necessary to determine patient infection status. The expected result is Negative.  Fact Sheet for Patients: EntrepreneurPulse.com.au  Fact Sheet for Healthcare Providers: IncredibleEmployment.be  This test is not yet approved or cleared by the [place] FDA and  has been authorized for detection and/or diagnosis of SARS-CoV-2 by FDA under an Emergency Use Authorization (EUA).  This EUA will remain in effect (meaning this test can be [place]  ed) for the duration of  the COVID-19 declaration under Section 564(b)(1) of the Act, 21 U.S.C. section 360bbb-3(b)(1), unless the authorization is terminated or revoked sooner.     Influenza A by PCR NEGATIVE NEGATIVE Final   Influenza B by PCR NEGATIVE NEGATIVE Final    Comment: (NOTE) The Xpert Xpress SARS-CoV-2/FLU/RSV plus assay is intended as an aid in the diagnosis of influenza from Nasopharyngeal swab specimens and should not be used as a sole basis for treatment. Nasal washings and aspirates are unacceptable for Xpert Xpress SARS-CoV-2/FLU/RSV testing.  Fact Sheet for Patients: EntrepreneurPulse.com.au  Fact Sheet for Healthcare Providers: IncredibleEmployment.be  This test is not yet approved or cleared by the [place] FDA and has been authorized for detection and/or diagnosis of SARS-CoV-2 by FDA under an Emergency Use Authorization (EUA). This EUA will remain in effect (meaning this test can be used) for the duration of the COVID-19 declaration under Section 564(b)(1) of the Act, 21 U.S.C. section 360bbb-3(b)(1), unless the authorization is terminated or revoked.  Performed at [facility], [address] [place], [place] [address]      Labs: BNP (last 3 results) Recent Labs    03/19/20 1455 01/26/21 1652  BNP 324.4* 505.3*   Basic Metabolic Panel: Recent Labs  Lab 01/22/21 1506 01/26/21 1652 01/27/21 0612  NA 136 140 142  K 4.9 4.2 3.8  CL 102 110 103  CO2 29 25 26   GLUCOSE 84 70 65*  BUN 33* 18 21  CREATININE 0.69 0.69 0.86  CALCIUM 8.9 6.7* 8.3*   Liver Function Tests: Recent Labs  Lab 01/22/21 1506 01/26/21 1652  AST 24 28  ALT 15 15  ALKPHOS 57 56  BILITOT 1.1 1.1  PROT 6.4* 4.8*  ALBUMIN 3.2* 2.4*   No results for input(s): LIPASE, AMYLASE in the last 168 hours. No results for input(s): AMMONIA in the last 168 hours. CBC: Recent Labs  Lab 01/22/21 1506 01/26/21 1652  01/27/21 0612  WBC 4.2 5.2 5.5  NEUTROABS  --  4.3  --   HGB 15.4* 14.4 13.2  HCT 47.8* 43.9 42.2  MCV 90.2 90.3 89.8  PLT 204 158 132*   Cardiac Enzymes: No results for input(s): CKTOTAL, CKMB, CKMBINDEX, TROPONINI in the last 168 hours. BNP: Invalid input(s): POCBNP CBG: No results for input(s): GLUCAP in the last 168 hours. D-Dimer No results for input(s): DDIMER in the last 72 hours. Hgb A1c No results for input(s): HGBA1C in the last 72 hours. Lipid Profile No results for input(s): CHOL, HDL, LDLCALC, TRIG, CHOLHDL, LDLDIRECT in the last 72 hours. Thyroid function studies No results for input(s): TSH, T4TOTAL, T3FREE, THYROIDAB in the last 72 hours.  Invalid input(s): FREET3 Anemia work up No results for input(s): VITAMINB12, FOLATE, FERRITIN, TIBC, IRON, RETICCTPCT in the last 72 hours. Urinalysis    Component Value Date/Time   COLORURINE YELLOW (A) 01/27/2021 1550   APPEARANCEUR HAZY (A) 01/27/2021 1550   APPEARANCEUR Clear 09/21/2011 0059   LABSPEC 1.024 01/27/2021 1550   LABSPEC 1.005 09/21/2011 0059   PHURINE 5.0 01/27/2021 1550   GLUCOSEU NEGATIVE 01/27/2021 1550   GLUCOSEU Negative 09/21/2011 0059   HGBUR SMALL (A) 01/27/2021 1550   BILIRUBINUR NEGATIVE 01/27/2021 1550   BILIRUBINUR Negative 09/21/2011 0059   KETONESUR 5 (A) 01/27/2021 1550   PROTEINUR NEGATIVE 01/27/2021 1550   NITRITE NEGATIVE 01/27/2021 1550   LEUKOCYTESUR SMALL (A) 01/27/2021 1550   LEUKOCYTESUR 1+ 09/21/2011 0059   Sepsis Labs Invalid input(s): PROCALCITONIN,  WBC,  LACTICIDVEN Microbiology Recent Results (from the past 240 hour(s))  Expectorated Sputum Assessment w Gram Stain, Rflx to Resp Cult     Status: None   Collection Time: 01/26/21  2:50 PM   Specimen: Urine, Random; Sputum  Result Value Ref Range Status   Specimen Description URINE, RANDOM  Final   Special Requests NONE  Final   Sputum evaluation   Final    THIS SPECIMEN IS ACCEPTABLE FOR SPUTUM CULTURE Performed at  [facility], [address] [place], [place] [address]    Report Status 01/27/2021 FINAL  Final  Culture, Respiratory w Gram Stain     Status: None (Preliminary result)   Collection Time: 01/26/21  2:50 PM   Specimen: Urine, Random  Result Value Ref Range Status   Specimen Description   Final    URINE, RANDOM Performed at [facility], [address], [place], [place] [address]    Special Requests   Final    NONE Reflexed from [id number] Performed at [facility], [address] [place], [place] [address]    Gram Stain   Final    MODERATE SQUAMOUS EPITHELIAL CELLS PRESENT MODERATE WBC PRESENT, PREDOMINANTLY MONONUCLEAR ABUNDANT GRAM POSITIVE COCCI FEW GRAM NEGATIVE RODS RARE GRAM POSITIVE RODS    Culture   Final    MODERATE Normal respiratory flora-no Staph aureus or Pseudomonas seen Performed at [facility], [place] [address], [place], [place] [address]    Report Status PENDING  Incomplete  Resp Panel by RT-PCR (Flu A&B, Covid) Nasopharyngeal Swab     Status: Abnormal   Collection Time: 01/27/21  8:06 AM   Specimen: Nasopharyngeal Swab; Nasopharyngeal(NP) swabs in vial transport medium  Result Value Ref Range Status   SARS Coronavirus 2 by RT PCR POSITIVE (A) NEGATIVE Final    Comment: RESULT CALLED TO, READ BACK BY AND VERIFIED WITH: REINA TALJOUR 01/27/21 1045 KLW (NOTE) SARS-CoV-2 target nucleic acids are DETECTED.  The SARS-CoV-2 RNA is generally detectable in upper respiratory specimens during the acute phase of infection. Positive results are indicative of the presence of the identified virus,  but do not rule out bacterial infection or co-infection with other pathogens not detected by the test. Clinical correlation with patient history and other diagnostic information is necessary to determine patient infection status. The expected result is Negative.  Fact Sheet for Patients: EntrepreneurPulse.com.au  Fact Sheet  for Healthcare Providers: IncredibleEmployment.be  This test is not yet approved or cleared by the [place] FDA and  has been authorized for detection and/or diagnosis of SARS-CoV-2 by FDA under an Emergency Use Authorization (EUA).  This EUA will remain in effect (meaning this test can be [place] ed) for the duration of  the COVID-19 declaration under Section 564(b)(1) of the Act, 21 U.S.C. section 360bbb-3(b)(1), unless the authorization is terminated or revoked sooner.     Influenza A by PCR NEGATIVE NEGATIVE Final   Influenza B by PCR NEGATIVE NEGATIVE Final    Comment: (NOTE) The Xpert Xpress SARS-CoV-2/FLU/RSV plus assay is intended as an aid in the diagnosis of influenza from Nasopharyngeal swab specimens and should not be used as a sole basis for treatment. Nasal washings and aspirates are unacceptable for Xpert Xpress SARS-CoV-2/FLU/RSV testing.  Fact Sheet for Patients: EntrepreneurPulse.com.au  Fact Sheet for Healthcare Providers: IncredibleEmployment.be  This test is not yet approved or cleared by the [place] FDA and has been authorized for detection and/or diagnosis of SARS-CoV-2 by FDA under an Emergency Use Authorization (EUA). This EUA will remain in effect (meaning this test can be used) for the duration of the COVID-19 declaration under Section 564(b)(1) of the Act, 21 U.S.C. section 360bbb-3(b)(1), unless the authorization is terminated or revoked.  Performed at [facility], [address] [place], [place] [address]      Time coordinating discharge: 35 minutes [place] STOP act does not apply to this Hospice patient.    30 Day Unplanned Readmission Risk Score    Flowsheet Row ED to Hosp-Admission (Current) from 01/26/2021 in [facility]  30 Day Unplanned Readmission Risk Score (%) 24.32 Filed at 01/29/2021 1200       This score is the patient's  risk of an unplanned readmission within 30 days of being discharged (0 -100%). The score is based on dignosis, age, lab data, medications, orders, and past utilization.   Low:  0-14.9   Medium: 15-21.9   High: 22-29.9   Extreme: 30 and above            SIGNED:   Edwin Dada, MD  [facility] 01/29/2021, 2:12 PM

## 2021-01-29 NOTE — Progress Notes (Signed)
EMS is here to transport pt home. Daughter Nicanor Alcon made aware.

## 2021-01-29 NOTE — Care Management Important Message (Signed)
Important Message  Patient Details  Name: Taylor Watson MRN: [id number] Date of Birth: Jun 19, 1918   Medicare Important Message Given:  Other (see comment)  Discharging with hospice services.  Medicare IM withheld at this time.     Dannette Barbara 01/29/2021, 4:07 PM

## 2021-01-29 NOTE — Telephone Encounter (Signed)
If she is going to the hospice home - hospice attending follows her.  If she is getting hospice at home, then I can remain attending or she can have a Hospice attending - whichever they prefer or whichever is easier for them.

## 2021-01-29 NOTE — Progress Notes (Signed)
PHARMACY NOTE:  ANTIMICROBIAL RENAL DOSAGE ADJUSTMENT  Current antimicrobial regimen includes a mismatch between antimicrobial dosage and estimated renal function.  As per policy approved by the Pharmacy & Therapeutics and Medical Executive Committees, the antimicrobial dosage will be adjusted accordingly.  Current antimicrobial dosage:  Augmentin 875/125 mg BID  Indication: Aspiration pneumonia  Renal Function:  Estimated Creatinine Clearance: 24.7 mL/min (by C-G formula based on SCr of 0.86 mg/dL).    Antimicrobial dosage has been changed to:  Augmentin 500/125 mg BID  Thank you for allowing pharmacy to be a part of this patient's care.  Benita Gutter, [facility] 01/29/2021 9:45 AM

## 2021-01-30 ENCOUNTER — Ambulatory Visit: Payer: Medicare Other | Admitting: Internal Medicine

## 2021-01-30 LAB — CULTURE, RESPIRATORY W GRAM STAIN: Culture: NORMAL

## 2021-01-31 DEATH — deceased

## 2021-02-04 ENCOUNTER — Ambulatory Visit: Payer: Medicare Other | Admitting: Family

## 2021-05-15 IMAGING — CT CT CERVICAL SPINE W/O CM
3 of 4 series · 13 of 33 positions shown, 16 images · non-contrast
Comparison: Brain MRI 05/18/2019. Head CT 05/18/2019. Neck CT
12/14/2013.

CLINICAL DATA: Head trauma, minor. Additional history provided:
Fall, mild headache.

EXAM:
CT HEAD WITHOUT CONTRAST
CT CERVICAL SPINE WITHOUT CONTRAST
TECHNIQUE: Multidetector CT imaging of the head and cervical spine was
performed following the standard protocol without intravenous
contrast. Multiplanar CT image reconstructions of the cervical spine
were also generated.

[Series 4: sagittal bone · sagittal · 0.29mm/px · 5 of 85 slices shown, 6 images]
[im 29/85  bone]
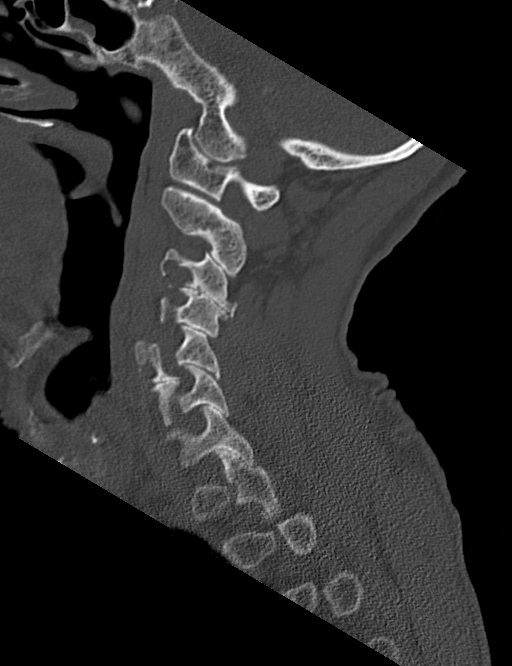
[im 36/85  bone]
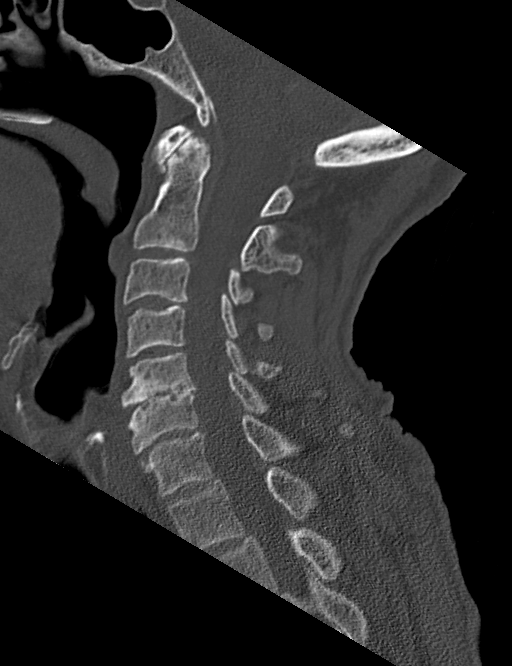
[im 43/85  soft-tissue]
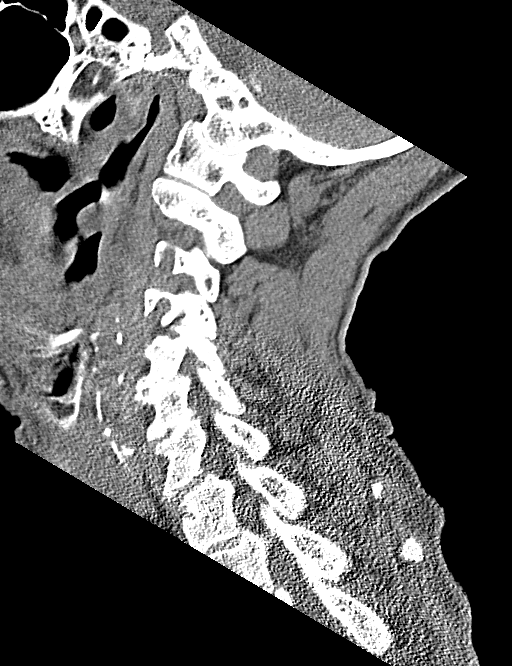
[im 43/85  bone]
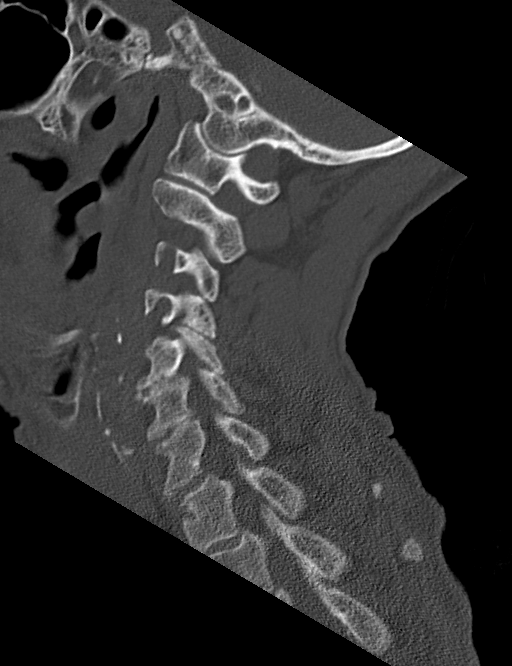
[im 50/85  bone]
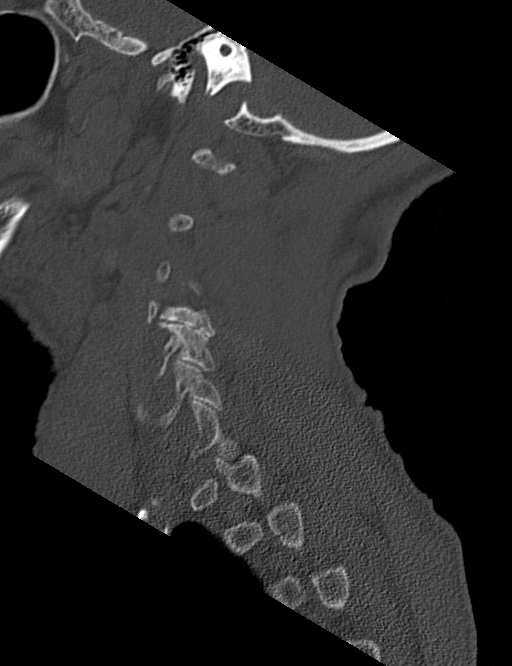
[im 57/85  bone]
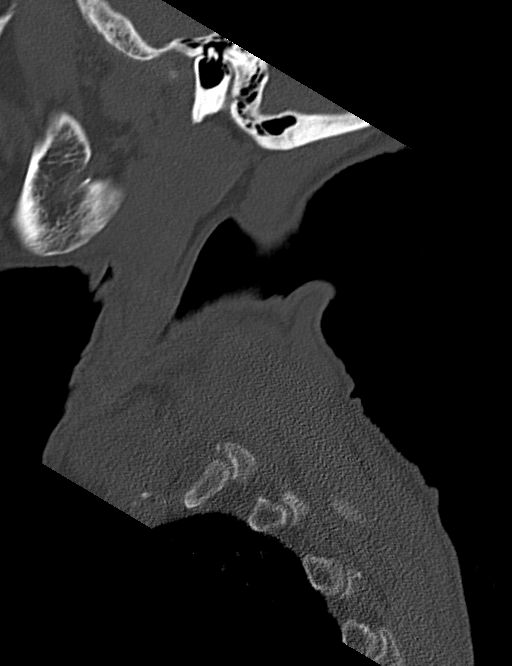

[Series 5: coronal bone · coronal · 0.33mm/px · 3 of 69 slices shown]
[im 21/69  bone]
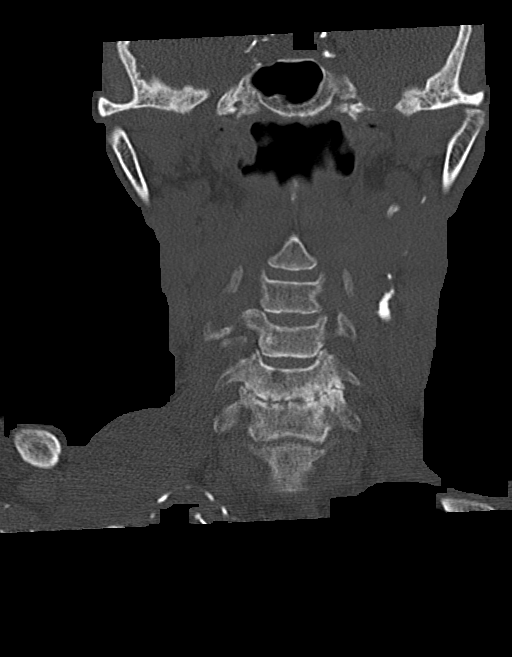
[im 30/69  bone]
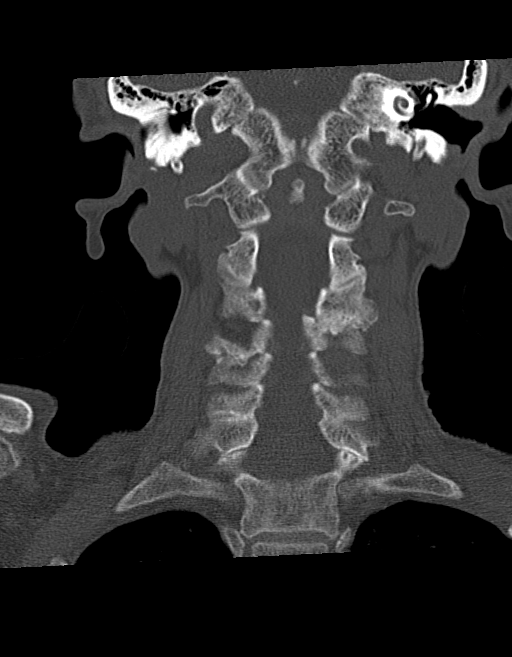
[im 39/69  bone]
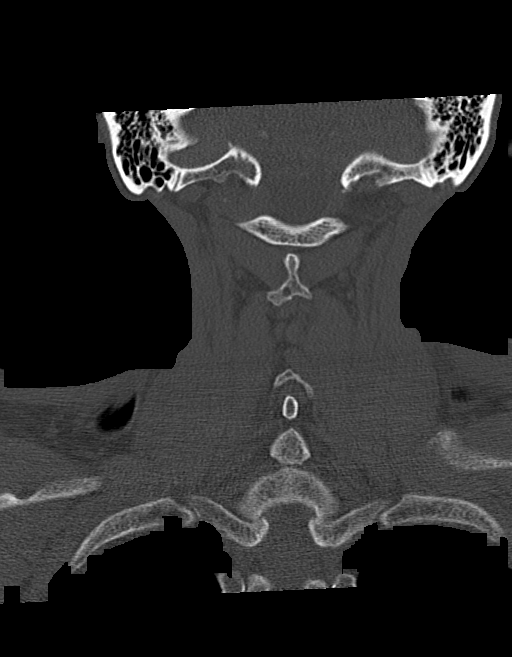

[Series 6: orthogonal bone · axial · 0.33mm/px · z∈[-225,-122]mm · 5 of 85 slices shown, 7 images]
[im 13/85  soft-tissue]
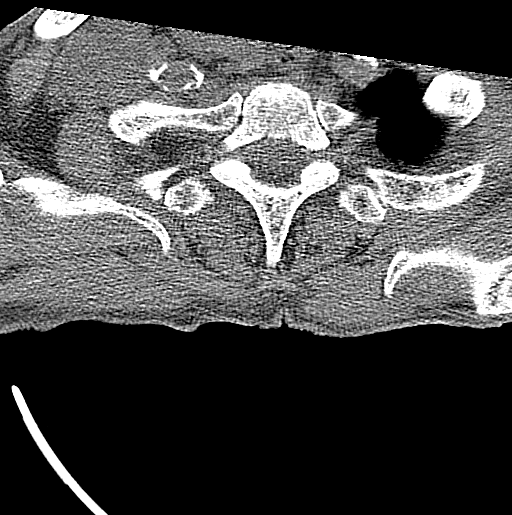
[im 13/85  bone]
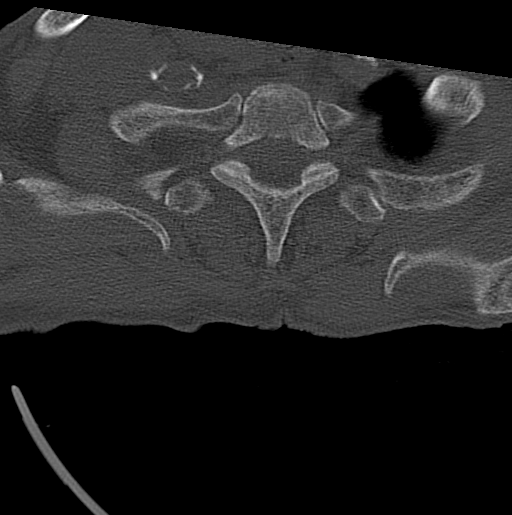
[im 25/85  bone]
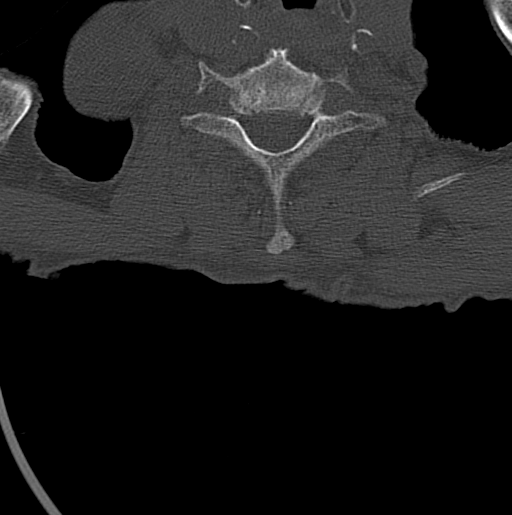
[im 49/85  bone]
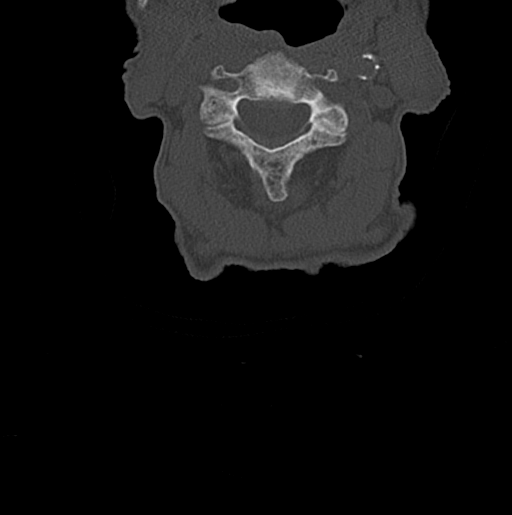
[im 61/85  bone]
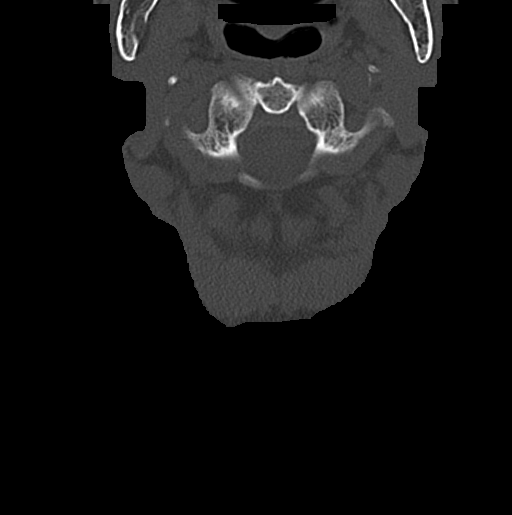
[im 73/85  soft-tissue]
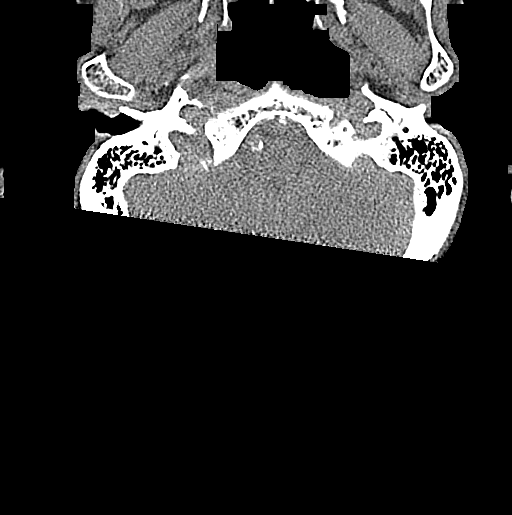
[im 73/85  bone]
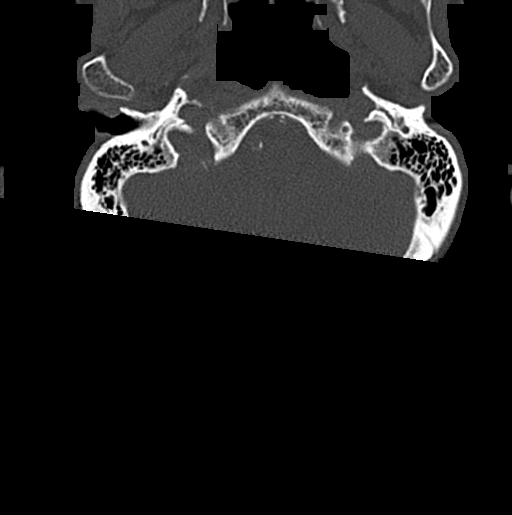

[13 of 33 positions shown; findings below may reference images not displayed]

FINDINGS: CT HEAD FINDINGS

Brain:

Mild generalized cerebral atrophy.

Moderate/advanced ill-defined hypoattenuation within the cerebral
white matter is nonspecific, but compatible with chronic small
vessel ischemic disease.

There is no acute intracranial hemorrhage.

No demarcated cortical infarct.

No extra-axial fluid collection.

No evidence of intracranial mass.

No midline shift.

Vascular: No hyperdense vessel.  Atherosclerotic calcifications.

Skull: No calvarial fracture. Redemonstrated geographic lucency
within the right temporoparietal calvarium consistent with
osteoporosis circumscripta (a 4 of Paget's disease).

Sinuses/Orbits: Visualized orbits show no acute finding. Mild
ethmoid sinus mucosal thickening. Small left sphenoid sinus mucous
retention cyst.

CT CERVICAL SPINE FINDINGS

Alignment: Trace C7-T1 grade 1 anterolisthesis.

Skull base and vertebrae: The basion-dental and atlanto-dental
intervals are maintained.No evidence of acute fracture to the
cervical spine.

Soft tissues and spinal canal: No prevertebral fluid or swelling. No
visible canal hematoma.

Disc levels: Cervical spondylosis. Most notably at C5-C6, there is
severe disc degeneration with degenerative endplate irregularity and
sclerosis, a posterior disc osteophyte complex and uncovertebral
hypertrophy.

Upper chest: No consolidation within the imaged lung apices. No
visible pneumothorax.

Other: Right supraclavicular mass measuring 4.1 x 3.2 x 3.9 cm.
IMPRESSION: CT head:

1. No evidence of acute intracranial abnormality.
2. Mild cerebral atrophy with moderate/advanced chronic small vessel
ischemic disease, stable as compared to the brain MRI of 05/18/2019.
[DATE]. Unchanged geographic lucency within the right temporoparietal
calvarium, compatible with osteoporosis circumscripta (a form of
Paget's disease).

CT cervical spine:

1. No evidence of acute fracture to the cervical spine.
2. Mild C7-T1 grade 1 anterolisthesis.
3. Cervical spondylosis, greatest at C5-C6.
4. 4.1 cm right supraclavicular mass increased in size since the
prior neck CT of 12/14/2013 (measuring 3.3 cm in greatest dimension
at that time). Review of prior electronic medical records shows that
this mass was biopsied in 1610 and pathology was consistent with a
schwannoma.

## 2021-06-16 ENCOUNTER — Ambulatory Visit: Payer: Medicare Other | Admitting: Family
